# Patient Record
Sex: Female | Born: 1985 | Race: Black or African American | Hispanic: No | Marital: Single | Smoking: Never smoker
Health system: Southern US, Community
[De-identification: ages and names within clinical notes are randomized; demographics above are authoritative.]

## PROBLEM LIST (undated history)

## (undated) ENCOUNTER — Inpatient Hospital Stay (HOSPITAL_COMMUNITY): Payer: Self-pay

## (undated) DIAGNOSIS — E0590 Thyrotoxicosis, unspecified without thyrotoxic crisis or storm: Secondary | ICD-10-CM

## (undated) DIAGNOSIS — K819 Cholecystitis, unspecified: Secondary | ICD-10-CM

## (undated) DIAGNOSIS — D573 Sickle-cell trait: Secondary | ICD-10-CM

## (undated) DIAGNOSIS — I10 Essential (primary) hypertension: Secondary | ICD-10-CM

## (undated) DIAGNOSIS — M2550 Pain in unspecified joint: Secondary | ICD-10-CM

## (undated) DIAGNOSIS — R51 Headache: Secondary | ICD-10-CM

## (undated) DIAGNOSIS — Z87440 Personal history of urinary (tract) infections: Secondary | ICD-10-CM

## (undated) HISTORY — PX: DILATION AND CURETTAGE OF UTERUS: SHX78

---
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
## 2003-04-26 ENCOUNTER — Emergency Department (HOSPITAL_COMMUNITY): Admission: AD | Admit: 2003-04-26 | Discharge: 2003-04-26 | Payer: Self-pay | Admitting: Family Medicine

## 2004-09-01 ENCOUNTER — Emergency Department (HOSPITAL_COMMUNITY): Admission: EM | Admit: 2004-09-01 | Discharge: 2004-09-01 | Payer: Self-pay | Admitting: *Deleted

## 2004-12-08 ENCOUNTER — Inpatient Hospital Stay (HOSPITAL_COMMUNITY): Admission: AD | Admit: 2004-12-08 | Discharge: 2004-12-08 | Payer: Self-pay | Admitting: *Deleted

## 2004-12-22 ENCOUNTER — Inpatient Hospital Stay (HOSPITAL_COMMUNITY): Admission: AD | Admit: 2004-12-22 | Discharge: 2004-12-22 | Payer: Self-pay | Admitting: *Deleted

## 2004-12-31 ENCOUNTER — Encounter: Payer: Self-pay | Admitting: Obstetrics & Gynecology

## 2005-01-01 ENCOUNTER — Observation Stay (HOSPITAL_COMMUNITY): Admission: EM | Admit: 2005-01-01 | Discharge: 2005-01-02 | Payer: Self-pay | Admitting: Emergency Medicine

## 2005-02-03 ENCOUNTER — Inpatient Hospital Stay (HOSPITAL_COMMUNITY): Admission: AD | Admit: 2005-02-03 | Discharge: 2005-02-03 | Payer: Self-pay | Admitting: Obstetrics and Gynecology

## 2005-02-05 ENCOUNTER — Inpatient Hospital Stay (HOSPITAL_COMMUNITY): Admission: AD | Admit: 2005-02-05 | Discharge: 2005-02-05 | Payer: Self-pay | Admitting: Obstetrics & Gynecology

## 2005-03-09 ENCOUNTER — Ambulatory Visit (HOSPITAL_COMMUNITY): Admission: RE | Admit: 2005-03-09 | Discharge: 2005-03-09 | Payer: Self-pay | Admitting: *Deleted

## 2005-03-23 ENCOUNTER — Ambulatory Visit: Payer: Self-pay | Admitting: *Deleted

## 2005-05-24 ENCOUNTER — Ambulatory Visit: Payer: Self-pay | Admitting: Certified Nurse Midwife

## 2005-05-24 ENCOUNTER — Inpatient Hospital Stay (HOSPITAL_COMMUNITY): Admission: AD | Admit: 2005-05-24 | Discharge: 2005-05-24 | Payer: Self-pay | Admitting: Obstetrics & Gynecology

## 2005-07-17 ENCOUNTER — Inpatient Hospital Stay (HOSPITAL_COMMUNITY): Admission: AD | Admit: 2005-07-17 | Discharge: 2005-07-20 | Payer: Self-pay | Admitting: Gynecology

## 2005-07-17 ENCOUNTER — Ambulatory Visit: Payer: Self-pay | Admitting: Family Medicine

## 2006-01-15 ENCOUNTER — Emergency Department (HOSPITAL_COMMUNITY): Admission: EM | Admit: 2006-01-15 | Discharge: 2006-01-15 | Payer: Self-pay | Admitting: Emergency Medicine

## 2006-07-25 ENCOUNTER — Emergency Department (HOSPITAL_COMMUNITY): Admission: EM | Admit: 2006-07-25 | Discharge: 2006-07-25 | Payer: Self-pay | Admitting: Emergency Medicine

## 2007-02-27 ENCOUNTER — Emergency Department (HOSPITAL_COMMUNITY): Admission: EM | Admit: 2007-02-27 | Discharge: 2007-02-27 | Payer: Self-pay | Admitting: Family Medicine

## 2008-02-14 ENCOUNTER — Emergency Department (HOSPITAL_COMMUNITY): Admission: EM | Admit: 2008-02-14 | Discharge: 2008-02-14 | Payer: Self-pay | Admitting: Family Medicine

## 2009-01-27 ENCOUNTER — Emergency Department (HOSPITAL_COMMUNITY): Admission: EM | Admit: 2009-01-27 | Discharge: 2009-01-27 | Payer: Self-pay | Admitting: Family Medicine

## 2010-09-04 NOTE — Group Therapy Note (Signed)
NAME:  Grace [name], Grace [name]                      ACCOUNT NO.:  [id number]   MEDICAL RECORD NO.:  [id number]                   PATIENT TYPE:  OUT   LOCATION:  [facility]                           FACILITY:  [facility]   PHYSICIAN:  Dorthula Perfect, MD                  DATE OF BIRTH:  [birth year]   DATE OF SERVICE:                                    CLINIC NOTE   HISTORY:  This is an 25 year old G1, P0-0-1-0 who had presented on August 12  to maternity admissions after having been seen at [facility] Emergency Room  two weeks prior to that with vomiting and a positive pregnancy test.  An  ultrasound was obtained on August 12 showing a probable molar pregnancy.  At  that time she had a urinalysis done that was positive for E. coli, pan  sensitive except for ampicillin.  She had a Chlamydia that was negative and  a GC that was negative.  She was counseled by Dorthula Perfect, MD who  proceeded with dilatation and evacuation suction procedure.  In addition, it  was thought that she may have a urethral diverticulum present.  Of note, the  urethra was cultured for some purulent discharge which also grew out E. coli  sensitive to everything except ampicillin.  Intraoperatively there were no  complications and EBL was about 1400-1500 mL.  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  Her beta hCG at that time was 158,000.  She  was discharged home on the 12th with the plan to get weekly quantitative  beta hCGs and to come back today for her postoperative check.  She did not  have her follow-up hCGs done yet.  She did complete her Keflex course August  12 through August 19.  She is not sexually active and is interested in  contraception.  She is asymptomatic for anemia.  Denies fatigue or weakness.  Her diet is fair.  She is unemployed.  Plans to look for a job soon.  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  This is not bothering her at the present time.   PHYSICAL EXAMINATION:  VITAL SIGNS:  Blood pressure 113/66, pulse 100.  GENERAL:  This is a thin black female in no distress.  Seems reserved and  tense.  ABDOMEN:  Soft, slightly protuberant, nontender, nondistended.  Fundus  palpable above symphysis.  PELVIC:  External genitalia significant for about 2 cm induration at  urethral meatus.  There is no purulence or other discharge at present.  It  is nontender and noninflamed.  Vagina without lesions.  Cervix without  lesions.  Bimanual reveals long, closed cervix and uterus seems smooth and  consistent without definite masses, but enlarged about 8-10 weeks size.  Adnexa also nontender.   ASSESSMENT:  1. Two weeks status  post D&E for molar pregnancy.  2. Microcytic anemia with hemoglobin 10.8, MCV 90.  3. 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  4. Urinary tract colonization versus infection, adequately treated.  5. In need of contraception.   PLAN:  The patient is to get a weekly follow-up quantitative beta hCG until  it becomes negative.  She will get one today.  After it becomes negative she  is to get monthly quantitative beta hCGs for six months.  She is advised to  begin iron supplementation 300 mg p.o. b.i.d. and eat high iron foods.  She  received Depo-Provera 150 mg IM today with instructions to follow up every  three months at [facility] where she normally gets her GYN care and  Pap smears.  We will consider follow-up for her SGPT which was mildly  elevated at 43 on August 12 as an incidental finding.  Will consult Dorthula Perfect, MD on need for follow-up here.     Sharolyn Douglas, CNM                           Dorthula Perfect, MD    DP/MEDQ  D:  12/11/2002  T:  12/11/2002  Job:  [id number]

## 2010-09-04 NOTE — H&P (Signed)
NAMEAMARRAH, MEINHART            ACCOUNT NO.:  [id number]   MEDICAL RECORD NO.:  [id number]          PATIENT TYPE:  INP   LOCATION:  6529                         FACILITY:  [facility]   PHYSICIAN:  Elliot Cousin, M.D.    DATE OF BIRTH:  August 14, 1985   DATE OF ADMISSION:  12/31/2004  DATE OF DISCHARGE:                                HISTORY & PHYSICAL   PRIMARY CARE PHYSICIAN:  The patient is currently unassigned.   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  The patient actually presented to [facility] initially but was  transferred to [facility] Emergency Department given her history.  Over the  past two weeks, the patient has had intermittent nausea and vomiting.  Her  intake has been fair to poor.  Over the past two days, she has been unable  to keep any food down.  She has been able to drink fluids at a minimal  level.  She usually drinks 4-5 cups of water or juice daily.  Associated  with the poor intake, the patient complains of a rapid heartbeat,  particularly when she stands up and walks.  At rest, she does not feel her  heart racing.  When she stands up and moves around, however, she feels her  heart beat much faster.  The rapid heartbeat is sometimes associated with  shortness of breath.  Occasionally, she will have light-headedness when she  stands up.  Her symptoms are not associated with chest pain.  When she sits  down, she feels better.  Her symptoms are similar to her presentation in  2004 when she suffered a spontaneous pneumomediastinum secondary to  increased intrathoracic pressure  from vomiting during her pregnancy at that  time.  However, her symptoms are different tonight in that she does not have  chest pain and she is not as short of breath.  The patient denies any  diarrhea, vaginal discharge, vaginal bleeding, or hematemesis.  She denies  hemoptysis, pleurisy, or cough.  She denies fever and chills.  She is [redacted]  weeks pregnant, diagnosed with a positive pregnancy test a few weeks ago.  She had an obstetrical ultrasound on December 08, 2004 which revealed a single  living intrauterine gestation with a crown-rump length indicating a  gestational age of [redacted] weeks 5 days.   When the patient presented to the emergency department at [facility], she was afebrile, with a temperature of 98.4; blood pressure  114/73; pulse rate 110; respiratory rate 20; and oxygen saturation of 100%  on room air.  When she stood up, her pulse rate increased to 156.  Her lab  data were essentially within normal limits.  The patient will be admitted  for further evaluation and management.   PAST MEDICAL HISTORY:  1.  Gravida 2 para 0.  2.  Spontaneous pneumomediastinum secondary to increased intrathoracic      pressure from increased vomiting during pregnancy in August 2004.  3.  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 Perlie Gold, November 29, 2002.   MEDICATIONS:  1.  Vitamin B6, 1 tablet daily.  2.  Unisom 25 mg daily.   ALLERGIES:  No known drug allergies.   SOCIAL HISTORY:  The patient is single.  She is unemployed.  She lives with  her mother.  She is a Engineer, agricultural.  She can read and write.  She  denies tobacco, drug, and alcohol use.   FAMILY HISTORY:  Her mother is 33 years of age and has diabetes and  hypertension.  Her father is 8 years of age; health is unknown.   REVIEW OF SYSTEMS:  The patient's review of systems is otherwise negative.   EXAMINATION:  VITAL SIGNS:  Temperature 98.4; supine blood pressure 114/73,   standing blood pressure 131/82; supine pulse 110, standing pulse 156;  respiratory rate 20; oxygen saturation 100% on room air.  GENERAL:  The patient is a medium-size, 25 year old, African-American woman  who is currently sitting up in bed in no acute distress.  HEENT:  Head is normocephalic, nontraumatic.  Pupils are equal, round, and  reactive to light.  Extraocular movements are intact.  Conjunctivae are  clear.  Sclerae are white.  Tympanic membranes are clear bilaterally.  Nasal  mucosa is mildly dry.  No sinus tenderness.  Oropharynx reveals mildly dry  mucous membranes.  No posterior exudates or erythema.  Teeth are in fairly  good repair.  NECK:  Supple.  No adenopathy, no thyromegaly, no bruit, no JVD.  LUNGS:  Clear to auscultation bilaterally.  HEART:  S1, S2, with no murmurs, rubs, or gallops.  However, there is  tachycardia.  ABDOMEN:  Mildly obese.  Positive bowel sounds.  Soft, nontender,  nondistended.  No hepatosplenomegaly.  No masses palpated.  RECTAL/GU:  Deferred.  EXTREMITIES:  Pedal pulses are 2+ bilaterally.  No pedal edema.  No  pretibial edema.  The patient has good range of motion of her extremities.  No acute joint abnormalities.  NEUROLOGIC:  The patient is alert and oriented x3.  Cranial nerves II-XII  are intact.  Strength is 5/5 throughout.  Sensation is intact.   ADMISSION LABORATORIES:  EKG:  Sinus tachycardia, with a heart rate of 109  beats per minute.  Chest x-ray:  No active cardiopulmonary disease.  No  pneumothorax or pneumomediastinum.  WBC 8.5, hemoglobin 13.6, hematocrit  39.9, MCV 86, platelets 415.  CK-MB less than 1, myoglobin 49.9, troponin I  less than 0.05.  Sodium 136, potassium 3.2, chloride 101, CO2 22, glucose  85, BUN 4, creatinine 0.6, calcium 9.2.  Urinalysis positive for small  bilirubin, greater than 80 ketones, leukocyte esterase large, WBC 7-10, RBCs  0-2, bacteria many, urine epithelial cells many.  ASSESSMENT:  1.  Sinus  tachycardia, with accelerated orthostatic tachycardia.  The most      likely etiology of the patient's tachycardia is volume depletion from      hyperemesis.  When she stands, she is more symptomatic with light-      headedness and tachycardia.  Her heart rate went from 110 in the supine      position to 156 while standing.  The patient has been obviously      symptomatic enough at home  that she presented to the emergency      department.  She does have a history of pneumomediastinum.  However, her      chest x-ray does not show pneumomediastinum, nor does it show a      pneumothorax.  She is oxygenating well within normal limits on room air.      She denies pleurisy and chest pain.  2.  Intractable nausea and vomiting/hyperemesis.  The patient's nausea and      vomiting are most likely secondary to pregnancy.  3.  A 9-week gestational pregnancy.  The patient is currently gravida 2 para      0.  Her past pregnancy history is noted above.  4.  Pyuria.  The patient has pyuria and bacteriuria on urinalysis.  However,      she has no complaints of dysuria.   PLAN:  1.  The patient will be admitted for further evaluation and management.  2.  The patient was given approximately 2 liters of either normal saline or      lactated Ringer's in the emergency department.  IV fluid volume      repletion will continue with D5-normal saline with potassium chloride      added at 150 cc/hour.  3.  Will add an amp of multivitamin with iron to 1 liter of IV fluids daily.  4.  Will replete potassium chloride in the IV fluids and via potassium      chloride 10 mEq IV runs.  Will also check a magnesium level.  5.  Will treat nausea and vomiting with Phenergan as needed.  6.  Will start clear liquids, except that the patient may have crackers as      needed for relief of nausea and vomiting.  7.  Will check a TSH, free T4, and urine drug screen.  8.  Will follow up lab data with a CMET at 2:00 p.m. today for  further      assessment.      Elliot Cousin, M.D.  Electronically Signed     DF/MEDQ  D:  01/01/2005  T:  01/01/2005  Job:  [id number]

## 2010-09-04 NOTE — Op Note (Signed)
   NAME:  Grace [name], BENHAM                      ACCOUNT NO.:  [id number]   MEDICAL RECORD NO.:  [id number]                   PATIENT TYPE:  AMB   LOCATION:  [facility]C                                  [facility]ACILITY:  [facility]   PHYSICIAN:  Clement Husbands, M.D.         DATE OF BIRTH:  Aug 03, 1985   DATE OF PROCEDURE:  11/29/2002  DATE OF DISCHARGE:                                 OPERATIVE REPORT   PREOPERATIVE DIAGNOSIS:  1. Molar pregnancy.  2. 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   POSTOPERATIVE DIAGNOSIS:  1. Molar pregnancy.  2. 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   OPERATION:  Suction dilation and curettage.   SURGEON:  Burnadette Peter, M.D.   ANESTHESIA:  General.   PROCEDURE:  Pelvic examination was performed under anesthesia which revealed  the uterus to be 16 to 18 weeks size.  During the exam, pus like material  came out of the urethra and massage of the suburethral area produced some  more.  It was sent for culture.  Anterior lip of the cervix was grasped with  a single tooth tenaculum.  The cervical os had already dilated itself and  easily accepted a #7 Hegar.  She was easily dilated to a #10.  Using a #10  curved suction curet, aspiration of the uterine contents was done  repeatedly.  The tissue, black fluid, and blood were all  removed.  This was  done repeatedly.  There was a moderate amount of blood loss.  Sharp  curettage was done at times.  The uterus, with intravenous Pitocin running,  came down to about a 10 weeks size.  It was still boggy and she was  bleeding, so IM Methergine was given followed by Hemabate.  Uterine massage  was continued.  Suction was done one more time and no additional tissue or  contents were noted.  Sharp curettage and no additional tissue was noted.  Pelvic examination at the time of the procedure revealed the uterus to be 6-  8 weeks size and quite firm.  Estimated blood loss (including tissue) was  1400 to 1500 mL.  She tolerated the procedure  well and was returned to the  recovery room in satisfactory condition.                                               Clement Husbands, M.D.    EFR/MEDQ  D:  11/29/2002  T:  11/29/2002  Job:  [id number]

## 2010-09-04 NOTE — Discharge Summary (Signed)
Grace [name], Grace [name]            ACCOUNT NO.:  [id number]   MEDICAL RECORD NO.:  [id number]          PATIENT TYPE:  OBV   LOCATION:  6529                         FACILITY:  [facility]   PHYSICIAN:  Lonia Blood, M.D.       DATE OF BIRTH:  03-Sep-1985   DATE OF ADMISSION:  12/31/2004  DATE OF DISCHARGE:  01/02/2005                                 DISCHARGE SUMMARY   DISCHARGE DIAGNOSES:  1.  Hyperemesis gravidarum.  2.  Dehydration.  3.  Hypokalemia.  4.  Protein malnutrition.  5.  Asymptomatic bacteriuria.  6.  Tachycardia.  7.  Elevated total thyroxine levels.  8.  Obesity.  9.  Gravida 9 weeks.  10. History of spontaneous pneumomediastinum secondary to increased      intrathoracic pressure from vomiting during the pregnancy in August      2004.  11. History of molar pregnancy in August 2004.  12. Constipation.   DISCHARGE MEDICATIONS:  1.  Prenatal vitamins daily.  2.  Amoxicillin 500 mg p.o. b.i.d. for two days.   CONDITION ON DISCHARGE:  The patient was discharged in good condition.  At  the time of discharge she was able to eat and drink without vomiting.  Her  vital signs were within normal limits and her heart rate was at 100 beats  per minute.   FOLLOW UP:  She was instructed to follow up with [facility]  clinic as previously scheduled and follow-up will be arranged with an  endocrinology practice in [place].  The patient will be called at home  and a date for the appointment will be communicated on January 04, 2005,  as now is the weekend and there are no endocrinology doctors available.   PROCEDURES DURING THIS ADMISSION:  No procedures were done.   CONSULTATIONS:  A telephonic consultation with the [facility]  endocrinology specialist was obtained on January 01, 2005.   HISTORY OF PRESENT ILLNESS:  For the admission H&P please refer to the  dictated H&P done by Dr. Elliot Cousin.  Briefly Grace [name] is a 25-year-  old  African-American woman, 9 weeks' pregnant, who presented to the  emergency room for persistent nausea, vomiting, and palpitations.  Upon  presentation to the emergency room she had a heart rate of 156 per minute  and she had orthostatic changes in her blood pressure.  She was admitted to  the hospital for further evaluation.   HOSPITAL COURSE:  1.  TACHYCARDIA.  An EKG was obtained and showed sinus tachycardia.  Serial      cardiac enzymes ruled out a myocardial infarction.  Chest x-ray failed      to show any cardiomegaly.  A BNP level was within normal limits.  Given      all the above, dilated cardiomyopathy was ruled out from the diagnostic      list.  To further evaluate the tachycardia, diagnostic studies for the      thyroid were pursued.  A total T4 was 24.4 and a TSH was 0.014.  It was      felt that the patient  had some mild hyperthyroidism, most likely related      to hyperemesis.  A telephonic consultation with [facility]      endocrinologist on call was obtained.  A free T4 level was pending at      the time of discharge.  The plan is to continue vigorous oral hydration      at home.  The patient will have a follow-up appointment in an      endocrinology practice to recheck free T4 in about a week.  At the time      of discharge her tachycardia improved significantly.  Her heart rate at      discharge was about 100 beats per minute.  Throughout the hospital stay      Grace [name] was kept on intravenous fluids.  Her potassium was repleted      and her blood pressure was stable at the time of discharge.   1.  During a routine check upon admission the patient had a urinalysis that      showed positive white blood cells, positive leukocyte esterase, positive      bacteria.  There are absolutely no symptoms, no dysuria, no hematuria,      no flank pain, no fever.  White blood cell count is within normal      limits.  It was felt that this was asymptomatic bacteriuria and       treatment with amoxicillin 500 mg by mouth twice a day was given for a      total of three days.  She will have a follow-up urinalysis at the visit      with the obstetrician/gynecologist.   1.  9-WEEK GESTATION.  An ultrasound was obtained at the time of admission      and showed the normal gestational sac within the uterus and the follow-      up visit is already scheduled at [facility] outpatient clinic.   1.  CONSTIPATION.  While in the hospital the patient was constipated.  She      was given a __________enema x1.           ______________________________  Lonia Blood, M.D.     SL/MEDQ  D:  01/02/2005  T:  01/02/2005  Job:  [id number]   cc:   Lesly Dukes, M.D.

## 2010-09-04 NOTE — Consult Note (Signed)
NAME:  Grace [name], MARCZAK                      ACCOUNT NO.:  [id number]   MEDICAL RECORD NO.:  [id number]                   PATIENT TYPE:  EMS   LOCATION:  MINO                                 FACILITY:  [facility]   PHYSICIAN:  Salvatore Decent. Dorris Fetch, M.D.         DATE OF BIRTH:  1985/10/13   DATE OF CONSULTATION:  11/18/2002  DATE OF DISCHARGE:                                   CONSULTATION   REASON FOR CONSULTATION:  Spontaneous pneumomediastinum.   CHIEF COMPLAINT:  Chest pain.   HISTORY OF PRESENT ILLNESS:  The patient is an 25 year old African-American  female who is nine weeks pregnant.  She has had hyperemesis of early  pregnancy.  Yesterday she had marked increase in the frequency and severity  of her emesis. She came to the emergency room complaining of pain in her  chest and her heart racing. An EKG showed sinus tachycardia and the patient  was dehydrated.  She was brought in and resuscitated.  Chest x-ray showed a  spontaneous pneumomediastinum.  She was given Zofran and Phenergan for her  nausea.  She continued to have vomiting overnight.  Today she returned for a  recheck and there was a slight increase in the size of her  pneumomediastinum.  Because of her continued pain, emesis and increasing  size of the pneumomediastinum, a barium swallow was performed to rule out an  esophageal perforation that was negative.   PAST MEDICAL HISTORY:  She is nine weeks pregnant.  She has no other major  medical problems.   MEDICATIONS:  1. Zofran.  2. Phenergan.   ALLERGIES:  She has no drug allergies.   FAMILY HISTORY:  Noncontributory.   SOCIAL HISTORY:  She has smoked marijuana one time recently but not in the  last two days.   PHYSICAL EXAMINATION:  GENERAL:  This is an 25 year old African-American  female in no acute distress.  VITAL SIGNS:  Blood pressure is 117/65, pulse is 104 in sinus tachycardia,  respirations are 20, oxygen saturation is 100% on room air.  HEENT:   Her trachea is midline.  She has no subcutaneous emphysema in the  neck or chest.  LUNGS:  Clear with equal breath sounds bilaterally.  CARDIAC:  Normal S1 and S2 with a questionable crunch.  Heart sounds are  loud.   Chest x-ray was reviewed and it does show pneumomediastinum with some  plaquing of the air superiorly into the thoracic outlet.   IMPRESSION:  The patient is an 25 year old African-American female with  early pregnancy with hyperemesis. She has a spontaneous pneumomediastinum  and esophageal rupture was ruled out with a negative swallow.  Therefore the  likely mechanism is rupture of the intraparenchymal bleb with retrograde  dissection of the air into the mediastinum.  Primary treatment in this case  is to deal with the underlying cause of her increased intrathoracic pressure  which in her case is vomiting.  She currently is able to  tolerate clear  liquids and has prescriptions for Zofran and Phenergan.  She should avoid  heavy exertion until her pain has completely resolved.  If she were to  develop fevers, chills, increased pain or change in the character of her  pain or shortness of breath, she should return to the emergency room  immediately. These issues were discussed with the patient and her mother.  Otherwise, no specific follow-up is indicated.  All of the patient's and  mother's questions were answered.                                               Salvatore Decent Dorris Fetch, M.D.    SCH/MEDQ  D:  11/18/2002  T:  11/19/2002  Job:  [id number]   cc:   Lorre Nick, M.D.  [address] [place], [place] [address]  Fax: [phone]

## 2010-10-08 ENCOUNTER — Inpatient Hospital Stay (HOSPITAL_COMMUNITY)
Admission: AD | Admit: 2010-10-08 | Discharge: 2010-10-08 | Disposition: A | Discharge status: Home / Self Care | Payer: Medicaid Other | Source: Ambulatory Visit | Attending: Obstetrics & Gynecology | Admitting: Obstetrics & Gynecology

## 2010-10-08 ENCOUNTER — Inpatient Hospital Stay (HOSPITAL_COMMUNITY): Payer: Medicaid Other

## 2010-10-08 DIAGNOSIS — A499 Bacterial infection, unspecified: Secondary | ICD-10-CM | POA: Insufficient documentation

## 2010-10-08 DIAGNOSIS — O2390 Unspecified genitourinary tract infection in pregnancy, unspecified trimester: Secondary | ICD-10-CM | POA: Insufficient documentation

## 2010-10-08 DIAGNOSIS — N760 Acute vaginitis: Secondary | ICD-10-CM | POA: Insufficient documentation

## 2010-10-08 DIAGNOSIS — O210 Mild hyperemesis gravidarum: Secondary | ICD-10-CM

## 2010-10-08 DIAGNOSIS — B9689 Other specified bacterial agents as the cause of diseases classified elsewhere: Secondary | ICD-10-CM | POA: Insufficient documentation

## 2010-10-08 LAB — URINALYSIS, ROUTINE W REFLEX MICROSCOPIC
Glucose, UA: NEGATIVE mg/dL
Ketones, ur: 40 mg/dL — AB
Nitrite: NEGATIVE
Specific Gravity, Urine: 1.025 (ref 1.005–1.030)
Urobilinogen, UA: 1 mg/dL (ref 0.0–1.0)
pH: 6 (ref 5.0–8.0)

## 2010-10-08 LAB — ABO/RH: ABO/RH(D): A POS

## 2010-10-08 LAB — WET PREP, GENITAL: Yeast Wet Prep HPF POC: NONE SEEN

## 2010-10-08 LAB — URINE MICROSCOPIC-ADD ON

## 2010-10-08 LAB — HCG, QUANTITATIVE, PREGNANCY: hCG, Beta Chain, Quant, S: 72363 m[IU]/mL — ABNORMAL HIGH (ref <5)

## 2010-10-08 LAB — CBC
Hemoglobin: 12.9 g/dL (ref 12.0–15.0)
MCH: 31.4 pg (ref 26.0–34.0)
MCHC: 37 g/dL — ABNORMAL HIGH (ref 30.0–36.0)
MCV: 84.9 fL (ref 78.0–100.0)
Platelets: 358 10*3/uL (ref 150–400)
RBC: 4.11 MIL/uL (ref 3.87–5.11)
WBC: 10 10*3/uL (ref 4.0–10.5)

## 2010-10-08 LAB — POCT PREGNANCY, URINE: Preg Test, Ur: POSITIVE

## 2010-10-09 LAB — GC/CHLAMYDIA PROBE AMP, GENITAL
Chlamydia, DNA Probe: NEGATIVE
GC Probe Amp, Genital: NEGATIVE

## 2010-10-10 LAB — URINE CULTURE: Culture  Setup Time: 201206220209

## 2010-10-17 ENCOUNTER — Inpatient Hospital Stay (HOSPITAL_COMMUNITY): Payer: Medicaid Other

## 2010-10-17 ENCOUNTER — Inpatient Hospital Stay (HOSPITAL_COMMUNITY)
Admission: AD | Admit: 2010-10-17 | Discharge: 2010-10-17 | Disposition: A | Discharge status: Home / Self Care | Payer: Medicaid Other | Source: Ambulatory Visit | Attending: Obstetrics and Gynecology | Admitting: Obstetrics and Gynecology

## 2010-10-17 DIAGNOSIS — N390 Urinary tract infection, site not specified: Secondary | ICD-10-CM | POA: Insufficient documentation

## 2010-10-17 DIAGNOSIS — K219 Gastro-esophageal reflux disease without esophagitis: Secondary | ICD-10-CM | POA: Insufficient documentation

## 2010-10-17 DIAGNOSIS — O2390 Unspecified genitourinary tract infection in pregnancy, unspecified trimester: Secondary | ICD-10-CM | POA: Insufficient documentation

## 2010-10-17 LAB — URINALYSIS, ROUTINE W REFLEX MICROSCOPIC
Bilirubin Urine: NEGATIVE
Glucose, UA: NEGATIVE mg/dL
Hgb urine dipstick: NEGATIVE
Nitrite: POSITIVE — AB
Protein, ur: NEGATIVE mg/dL
Specific Gravity, Urine: 1.015 (ref 1.005–1.030)
pH: 6 (ref 5.0–8.0)

## 2010-10-17 LAB — CBC
HCT: 37 % (ref 36.0–46.0)
Hemoglobin: 13.3 g/dL (ref 12.0–15.0)
MCH: 31.4 pg (ref 26.0–34.0)
MCHC: 35.9 g/dL (ref 30.0–36.0)
MCV: 87.3 fL (ref 78.0–100.0)
Platelets: 345 10*3/uL (ref 150–400)
RDW: 12.2 % (ref 11.5–15.5)
WBC: 7.9 10*3/uL (ref 4.0–10.5)

## 2010-10-17 LAB — URINE MICROSCOPIC-ADD ON

## 2010-10-17 LAB — TSH: TSH: 0.042 u[IU]/mL — ABNORMAL LOW (ref 0.350–4.500)

## 2010-10-19 LAB — URINE CULTURE
Colony Count: >=100000
Culture  Setup Time: 201206301755

## 2010-11-18 ENCOUNTER — Encounter (HOSPITAL_COMMUNITY): Payer: Self-pay | Admitting: *Deleted

## 2010-11-18 ENCOUNTER — Inpatient Hospital Stay (HOSPITAL_COMMUNITY)
Admission: AD | Admit: 2010-11-18 | Discharge: 2010-11-19 | Disposition: A | Discharge status: Home / Self Care | Payer: Self-pay | Source: Ambulatory Visit | Attending: Obstetrics and Gynecology | Admitting: Obstetrics and Gynecology

## 2010-11-18 DIAGNOSIS — R1013 Epigastric pain: Secondary | ICD-10-CM | POA: Insufficient documentation

## 2010-11-18 DIAGNOSIS — R112 Nausea with vomiting, unspecified: Secondary | ICD-10-CM | POA: Insufficient documentation

## 2010-11-18 DIAGNOSIS — E860 Dehydration: Secondary | ICD-10-CM | POA: Insufficient documentation

## 2010-11-18 DIAGNOSIS — E876 Hypokalemia: Secondary | ICD-10-CM | POA: Insufficient documentation

## 2010-11-18 LAB — URINE MICROSCOPIC-ADD ON

## 2010-11-18 LAB — DIFFERENTIAL
Basophils Absolute: 0 10*3/uL (ref 0.0–0.1)
Basophils Relative: 0 % (ref 0–1)
Eosinophils Absolute: 0.1 10*3/uL (ref 0.0–0.7)
Monocytes Absolute: 1 10*3/uL (ref 0.1–1.0)
Neutro Abs: 4 10*3/uL (ref 1.7–7.7)
Neutrophils Relative %: 54 % (ref 43–77)

## 2010-11-18 LAB — COMPREHENSIVE METABOLIC PANEL
AST: 21 U/L (ref 0–37)
Albumin: 3.4 g/dL — ABNORMAL LOW (ref 3.5–5.2)
Chloride: 97 mEq/L (ref 96–112)
Creatinine, Ser: <0.47 mg/dL — ABNORMAL LOW (ref 0.50–1.10)
Total Bilirubin: 0.5 mg/dL (ref 0.3–1.2)
Total Protein: 7.6 g/dL (ref 6.0–8.3)

## 2010-11-18 LAB — URINALYSIS, ROUTINE W REFLEX MICROSCOPIC
Glucose, UA: NEGATIVE mg/dL
Hgb urine dipstick: NEGATIVE
Specific Gravity, Urine: 1.02 (ref 1.005–1.030)
pH: 6 (ref 5.0–8.0)

## 2010-11-18 LAB — CBC
HCT: 34.8 % — ABNORMAL LOW (ref 36.0–46.0)
MCHC: 35.9 g/dL (ref 30.0–36.0)
RDW: 12.5 % (ref 11.5–15.5)

## 2010-11-18 MED ORDER — POTASSIUM CHLORIDE IN NACL 20-0.9 MEQ/L-% IV SOLN: Freq: Once | INTRAVENOUS | Status: DC

## 2010-11-18 MED ORDER — ONDANSETRON 8 MG PO TBDP
8.0000 mg | ORAL_TABLET | Freq: Once | ORAL | Status: AC
  Administered 2010-11-19: 8 mg via ORAL
  Filled 2010-11-18: qty 1

## 2010-11-18 NOTE — ED Provider Notes (Signed)
History   Pt presents today c/o N&V and abd pain that radiates to her epigastric area. She denies vag dc, bleeding, or any other sx at this time. She states it has been difficult for her to keep anything on her stomach.  Chief Complaint  Patient presents with  . Abdominal Pain   HPI  OB History    Grav Para Term Preterm Abortions TAB SAB Ect Mult Living   3 1 1  1  1   1       Past Medical History  Diagnosis Date  . Hypothyroidism     Past Surgical History  Procedure Date  . No past surgeries     No family history on file.  History  Substance Use Topics  . Smoking status: Never Smoker   . Smokeless tobacco: Not on file  . Alcohol Use: No    Allergies: No Known Allergies  Prescriptions prior to admission  Medication Sig Dispense Refill  . prenatal vitamin w/FE, FA (PRENATAL 1 + 1) 27-1 MG TABS Take 1 tablet by mouth daily.          Review of Systems  Constitutional: Negative for fever and chills.  Respiratory: Negative for cough.   Cardiovascular: Negative for chest pain and palpitations.  Gastrointestinal: Positive for nausea, vomiting and abdominal pain. Negative for diarrhea and constipation.  Genitourinary: Negative for dysuria, urgency, frequency, hematuria and flank pain.  Neurological: Negative for dizziness and headaches.  Psychiatric/Behavioral: Negative for depression and suicidal ideas.   Physical Exam   Blood pressure 120/77, pulse 89, temperature 99 F (37.2 C), temperature source Oral, resp. rate 18, height 5\' 4"  (1.626 m), weight 134 lb 12.8 oz (61.145 kg).  Physical Exam  Constitutional: She is oriented to person, place, and time. She appears well-developed and well-nourished. No distress.  HENT:  Head: Normocephalic and atraumatic.  Eyes: EOM are normal. Pupils are equal, round, and reactive to light.  GI: Soft. She exhibits no distension. There is no tenderness. There is no rebound and no guarding.  Neurological: She is alert and oriented  to person, place, and time.  Skin: Skin is warm and dry. She is not diaphoretic.  Psychiatric: She has a normal mood and affect. Her behavior is normal. Judgment and thought content normal.    MAU Course  Procedures  Results for orders placed during the hospital encounter of 11/18/10 (from the past 24 hour(s))  URINALYSIS, ROUTINE W REFLEX MICROSCOPIC     Status: Abnormal   Collection Time   11/18/10  9:15 PM      Component Value Range   Color, Urine YELLOW  YELLOW    Appearance HAZY (*) CLEAR    Specific Gravity, Urine 1.020  1.005 - 1.030    pH 6.0  5.0 - 8.0    Glucose, UA NEGATIVE  NEGATIVE (mg/dL)   Hgb urine dipstick NEGATIVE  NEGATIVE    Bilirubin Urine NEGATIVE  NEGATIVE    Ketones, ur 15 (*) NEGATIVE (mg/dL)   Protein, ur NEGATIVE  NEGATIVE (mg/dL)   Urobilinogen, UA 4.0 (*) 0.0 - 1.0 (mg/dL)   Nitrite NEGATIVE  NEGATIVE    Leukocytes, UA SMALL (*) NEGATIVE   URINE MICROSCOPIC-ADD ON     Status: Abnormal   Collection Time   11/18/10  9:15 PM      Component Value Range   Squamous Epithelial / LPF FEW (*) RARE    WBC, UA 11-20  <3 (WBC/hpf)   Bacteria, UA MANY (*) RARE  Urine-Other MUCOUS PRESENT    CBC     Status: Abnormal   Collection Time   11/18/10 10:45 PM      Component Value Range   WBC 7.3  4.0 - 10.5 (K/uL)   RBC 4.04  3.87 - 5.11 (MIL/uL)   Hemoglobin 12.5  12.0 - 15.0 (g/dL)   HCT 16.1 (*) 09.6 - 46.0 (%)   MCV 86.1  78.0 - 100.0 (fL)   MCH 30.9  26.0 - 34.0 (pg)   MCHC 35.9  30.0 - 36.0 (g/dL)   RDW 04.5  40.9 - 81.1 (%)   Platelets 356  150 - 400 (K/uL)  DIFFERENTIAL     Status: Abnormal   Collection Time   11/18/10 10:45 PM      Component Value Range   Neutrophils Relative 54  43 - 77 (%)   Neutro Abs 4.0  1.7 - 7.7 (K/uL)   Lymphocytes Relative 31  12 - 46 (%)   Lymphs Abs 2.3  0.7 - 4.0 (K/uL)   Monocytes Relative 13 (*) 3 - 12 (%)   Monocytes Absolute 1.0  0.1 - 1.0 (K/uL)   Eosinophils Relative 1  0 - 5 (%)   Eosinophils Absolute 0.1  0.0 -  0.7 (K/uL)   Basophils Relative 0  0 - 1 (%)   Basophils Absolute 0.0  0.0 - 0.1 (K/uL)  COMPREHENSIVE METABOLIC PANEL     Status: Abnormal   Collection Time   11/18/10 10:45 PM      Component Value Range   Sodium 133 (*) 135 - 145 (mEq/L)   Potassium 2.9 (*) 3.5 - 5.1 (mEq/L)   Chloride 97  96 - 112 (mEq/L)   CO2 24  19 - 32 (mEq/L)   Glucose, Bld 84  70 - 99 (mg/dL)   BUN 3 (*) 6 - 23 (mg/dL)   Creatinine, Ser <9.14 (*) 0.50 - 1.10 (mg/dL)   Calcium 78.2  8.4 - 10.5 (mg/dL)   Total Protein 7.6  6.0 - 8.3 (g/dL)   Albumin 3.4 (*) 3.5 - 5.2 (g/dL)   AST 21  0 - 37 (U/L)   ALT 15  0 - 35 (U/L)   Alkaline Phosphatase 38 (*) 39 - 117 (U/L)   Total Bilirubin 0.5  0.3 - 1.2 (mg/dL)   GFR calc non Af Amer NOT CALCULATED  >60 (mL/min)   GFR calc Af Amer NOT CALCULATED  >60 (mL/min)     Assessment and Plan  N&V: pt doing much better after IV hydration and potassium supplementation. Will dc to home with Rx for phenergan. Discussed diet, activity, risks, and precautions. She will f/u with her OB provider. Discussed diet, activity, risks, and precautions.  Clinton Gallant. Damieon Armendariz III, DrHSc, MPAS, PA-C  11/18/2010, 11:34 PM   Henrietta Hoover, PA 11/19/10 [phone]

## 2010-11-18 NOTE — Progress Notes (Cosign Needed)
Pt G3 P1 at 13wks, having lower abd pain that radiates to upper abd, since 7/31.  Denies bleeding.  Vag Discharge thick and white.

## 2010-11-19 MED ORDER — PROMETHAZINE HCL 25 MG PO TABS: 25.0000 mg | ORAL_TABLET | Freq: Four times a day (QID) | ORAL | Status: DC | PRN

## 2010-11-19 MED ORDER — POTASSIUM CHLORIDE IN NACL 20-0.9 MEQ/L-% IV SOLN
INTRAVENOUS | Status: DC
  Filled 2010-11-19 (×20): qty 1000

## 2010-11-19 MED ORDER — SODIUM CHLORIDE 0.9 % IV SOLN
Freq: Once | INTRAVENOUS | Status: AC
  Administered 2010-11-19: via INTRAVENOUS
  Filled 2010-11-19: qty 1000

## 2010-12-01 ENCOUNTER — Other Ambulatory Visit: Payer: Self-pay | Admitting: Family Medicine

## 2010-12-01 DIAGNOSIS — D573 Sickle-cell trait: Secondary | ICD-10-CM

## 2010-12-01 LAB — CULTURE, OB URINE
Chlamydia Probe Amp: NEGATIVE
GC Probe Amp, Genital: NEGATIVE
Glucose, 1 hour: 136

## 2010-12-01 LAB — ABO/RH: RH Type: POSITIVE

## 2010-12-01 LAB — HEPATITIS B SURFACE ANTIGEN: Hepatitis B Surface Ag: NEGATIVE

## 2010-12-01 LAB — HIV ANTIBODY (ROUTINE TESTING W REFLEX): HIV: NONREACTIVE

## 2010-12-30 ENCOUNTER — Ambulatory Visit (HOSPITAL_COMMUNITY)
Admission: RE | Admit: 2010-12-30 | Discharge: 2010-12-30 | Disposition: A | Discharge status: Home / Self Care | Payer: Medicaid Other | Source: Ambulatory Visit | Attending: Family Medicine | Admitting: Family Medicine

## 2010-12-30 ENCOUNTER — Encounter (HOSPITAL_COMMUNITY): Payer: Self-pay

## 2010-12-30 DIAGNOSIS — Z1389 Encounter for screening for other disorder: Secondary | ICD-10-CM | POA: Insufficient documentation

## 2010-12-30 DIAGNOSIS — Z363 Encounter for antenatal screening for malformations: Secondary | ICD-10-CM | POA: Insufficient documentation

## 2010-12-30 DIAGNOSIS — D573 Sickle-cell trait: Secondary | ICD-10-CM

## 2010-12-30 DIAGNOSIS — O358XX0 Maternal care for other (suspected) fetal abnormality and damage, not applicable or unspecified: Secondary | ICD-10-CM | POA: Insufficient documentation

## 2010-12-30 NOTE — Progress Notes (Signed)
Patient seen for ultrasound only appointment today.  Please see [facility] report for details.  

## 2010-12-30 NOTE — Progress Notes (Signed)
Genetic Counseling  High-Risk Gestation Note  Appointment Date:  12/30/2010 Referred By: Reva Bores, MD Date of Birth:  [birth year] Partner: Murray Hodgkins    Pregnancy History: [id number] Estimated Date of Delivery: 05/28/11 Estimated Gestational Age: [redacted]w[redacted]d  I met with [name] and her partner, Mr. Murray Hodgkins, for genetic counseling regarding a family history of sickle cell anemia (SCA).  Both family histories were reviewed and were found to be contributory for Ms. Spruiell having a child, with a different partner, who has SCA.  Although medical records provided from the referring provider listed that both Ms. Stepney and Mr. Mayford Knife have sickle cell trait, Mr. Mayford Knife reported today that he DOES NOT have sickle cell trait.  He did not have an actual hemoglobin electrophoresis result to confirm this information.    This couple was counseled that SCA is a genetic condition that is more common in individuals who have African-American ancestry.  It was explained that the features of SCA are due to the production of abnormal hemoglobin and that hemoglobin is a protein that carries oxygen from the lungs to the body's organs and tissues.  They were then counseled that hemoglobin is made from instructions contained within our genes and changes within the beta globin gene can affects the shape and function of the red blood cells.  We discussed that the usual type of hemoglobin is called hemoglobin AA. Individuals usually have two copies of each gene; one copy inherited from the father, and the other copy inherited from the mother.  Likewise, only one copy of each gene of a pair is passed on to a child (pregnancy).  Carriers of SCA have hemoglobin AS, meaning that one beta globin gene codes for the typical hemoglobin A, and the other one is altered, coding for a hemoglobin S.  Because SCA carriers have one working copy of the beta globin gene, they are healthy and do not have any features of the  condition or related medical concerns.      We reviewed recessive inheritance in detail, including the 25% chance for recurrence with each pregnancy when both parents have hemoglobin AS.  Assuming the 1 in 10 carrier frequency of sickle cell trait in African-Americans, the risk for the fetus to have SCA is ~1 in 40 [1 (chance that Ms. Lybbert has hemoglobin AS) x 1/10 (chance that Mr. Mayford Knife has hemoglobin AS-without having hemoglobin electrophoresis) x  (the chance that both would pass on the altered beta globin gene)].   Briefly, they were then counseled that individuals with SCA have two altered copies of the beta globin gene, producing hemoglobin SS.  This altered form of hemoglobin tends to release oxygen too easily, and when the oxygen is gone the red blood cells become stiff and "sickled" or crescent shaped.  These red blood cells break down easier and faster than typical red blood cells which causes anemia.  The shortage of red blood cells prevents the appropriate transport of oxygen to the body and is characterized by tiredness and a susceptibility to infections.  In addition, the sickled shape of the red blood cells prevents these cells from traveling through some of the small blood vessels.  These trapped cells can stack up and cause blockage.  This blockage can deprive the organs and tissues of enough oxygen, result in episodes of pain, and ultimately cause damage to tissues and vital organs.  We discussed, the pain from these "crises" is unpredictable, can occur in any area of the body, and  episodes can be as infrequent as once per year to as often as many times per year.  Children with SCA can also have delayed growth and more frequent infections due to spleen changes caused by sickled red blood cells becoming trapped in the spleen.   We discussed the availability of hemoglobin electrophoresis for Mr. Mayford Knife in order to provide the most accurate fetal risk assessment.  He understands that  this testing reveals variants of hemoglobin other than hemoglobin S, which when combined with hemoglobin S, can cause a clinically significant hemoglobinopathy.  After thoughtful consideration of this option, this couple declined further testing for Mr. Mayford Knife and the current pregnancy.  They expressed interest in newborn screening for hemoglobinopathies.     Ms. Jani medical history was reviewed.  She denied use of alcohol, tobacco, and illicit substances during this pregnancy.  She denied having significant illnesses or environmental exposures.     Thank you for sharing in the care of Ms. Khushbu Gentry with [place].  Please do not hesitate to contact us if you have any questions or concerns.  I counseled the patient for approximately 30 minutes regarding the above risks and available options.    Donald Prose, MS  Certified Genetic Counselor

## 2010-12-30 NOTE — ED Notes (Signed)
Pt states she takes "medication for thyroid"  But not sure of name or dosage.

## 2011-03-05 ENCOUNTER — Inpatient Hospital Stay (HOSPITAL_COMMUNITY)
Admission: AD | Admit: 2011-03-05 | Discharge: 2011-03-08 | DRG: 781 | Disposition: A | Discharge status: Home / Self Care | Payer: Medicaid Other | Source: Ambulatory Visit | Attending: Obstetrics and Gynecology | Admitting: Obstetrics and Gynecology

## 2011-03-05 ENCOUNTER — Encounter (HOSPITAL_COMMUNITY): Payer: Self-pay | Admitting: *Deleted

## 2011-03-05 DIAGNOSIS — Z3480 Encounter for supervision of other normal pregnancy, unspecified trimester: Secondary | ICD-10-CM

## 2011-03-05 DIAGNOSIS — D573 Sickle-cell trait: Secondary | ICD-10-CM | POA: Diagnosis present

## 2011-03-05 DIAGNOSIS — O2390 Unspecified genitourinary tract infection in pregnancy, unspecified trimester: Principal | ICD-10-CM | POA: Diagnosis present

## 2011-03-05 DIAGNOSIS — O99019 Anemia complicating pregnancy, unspecified trimester: Secondary | ICD-10-CM | POA: Diagnosis present

## 2011-03-05 DIAGNOSIS — O2300 Infections of kidney in pregnancy, unspecified trimester: Secondary | ICD-10-CM

## 2011-03-05 DIAGNOSIS — N12 Tubulo-interstitial nephritis, not specified as acute or chronic: Secondary | ICD-10-CM | POA: Diagnosis present

## 2011-03-05 DIAGNOSIS — A498 Other bacterial infections of unspecified site: Secondary | ICD-10-CM | POA: Diagnosis present

## 2011-03-05 DIAGNOSIS — E0590 Thyrotoxicosis, unspecified without thyrotoxic crisis or storm: Secondary | ICD-10-CM

## 2011-03-05 HISTORY — DX: Headache: R51

## 2011-03-05 HISTORY — DX: Thyrotoxicosis, unspecified without thyrotoxic crisis or storm: E05.90

## 2011-03-05 HISTORY — DX: Sickle-cell trait: D57.3

## 2011-03-05 LAB — DIFFERENTIAL
Basophils Absolute: 0 10*3/uL (ref 0.0–0.1)
Basophils Relative: 0 % (ref 0–1)
Eosinophils Relative: 0 % (ref 0–5)
Lymphocytes Relative: 10 % — ABNORMAL LOW (ref 12–46)
Monocytes Absolute: 1.4 10*3/uL — ABNORMAL HIGH (ref 0.1–1.0)
Monocytes Relative: 8 % (ref 3–12)
Neutro Abs: 13.7 10*3/uL — ABNORMAL HIGH (ref 1.7–7.7)

## 2011-03-05 LAB — URINALYSIS, ROUTINE W REFLEX MICROSCOPIC
Bilirubin Urine: NEGATIVE
Ketones, ur: NEGATIVE mg/dL
Nitrite: NEGATIVE
Protein, ur: NEGATIVE mg/dL

## 2011-03-05 LAB — COMPREHENSIVE METABOLIC PANEL
AST: 12 U/L (ref 0–37)
BUN: <3 mg/dL — ABNORMAL LOW (ref 6–23)
CO2: 23 mEq/L (ref 19–32)
Calcium: 8.8 mg/dL (ref 8.4–10.5)
Chloride: 100 mEq/L (ref 96–112)
Creatinine, Ser: 0.49 mg/dL — ABNORMAL LOW (ref 0.50–1.10)
GFR calc non Af Amer: >90 mL/min (ref >90)
Total Bilirubin: 0.3 mg/dL (ref 0.3–1.2)

## 2011-03-05 LAB — CBC
HCT: 29.3 % — ABNORMAL LOW (ref 36.0–46.0)
Hemoglobin: 10 g/dL — ABNORMAL LOW (ref 12.0–15.0)
MCHC: 34.1 g/dL (ref 30.0–36.0)
MCV: 88.3 fL (ref 78.0–100.0)
RDW: 13 % (ref 11.5–15.5)

## 2011-03-05 MED ORDER — OXYCODONE-ACETAMINOPHEN 5-325 MG PO TABS
1.0000 | ORAL_TABLET | ORAL | Status: DC | PRN
  Administered 2011-03-05: 1 via ORAL
  Administered 2011-03-06 (×2): 2 via ORAL
  Administered 2011-03-06: 1 via ORAL
  Administered 2011-03-06 – 2011-03-08 (×4): 2 via ORAL
  Filled 2011-03-05 (×6): qty 2
  Filled 2011-03-05: qty 1
  Filled 2011-03-05: qty 2
  Filled 2011-03-05: qty 1

## 2011-03-05 MED ORDER — ONDANSETRON HCL 4 MG/2ML IJ SOLN
4.0000 mg | INTRAMUSCULAR | Status: DC | PRN
  Administered 2011-03-05 – 2011-03-07 (×2): 4 mg via INTRAVENOUS
  Filled 2011-03-05 (×2): qty 2

## 2011-03-05 MED ORDER — SODIUM CHLORIDE 0.9 % IV SOLN
INTRAVENOUS | Status: DC
  Administered 2011-03-05 – 2011-03-08 (×7): via INTRAVENOUS

## 2011-03-05 MED ORDER — DEXTROSE 5 % IV SOLN
1.0000 g | Freq: Two times a day (BID) | INTRAVENOUS | Status: DC
  Administered 2011-03-05 – 2011-03-08 (×7): 1 g via INTRAVENOUS
  Filled 2011-03-05 (×7): qty 10

## 2011-03-05 MED ORDER — ACETAMINOPHEN 325 MG PO TABS
650.0000 mg | ORAL_TABLET | ORAL | Status: DC | PRN
  Administered 2011-03-05 – 2011-03-06 (×3): 650 mg via ORAL
  Filled 2011-03-05 (×3): qty 2

## 2011-03-05 MED ORDER — PRENATAL PLUS 27-1 MG PO TABS
1.0000 | ORAL_TABLET | Freq: Every day | ORAL | Status: DC
  Administered 2011-03-06: 1 via ORAL
  Filled 2011-03-05 (×4): qty 1

## 2011-03-05 MED ORDER — LACTATED RINGERS IV SOLN
INTRAVENOUS | Status: DC
  Administered 2011-03-05: 17:00:00 via INTRAVENOUS

## 2011-03-05 MED ORDER — DOCUSATE SODIUM 100 MG PO CAPS
100.0000 mg | ORAL_CAPSULE | Freq: Every day | ORAL | Status: DC
  Administered 2011-03-08: 100 mg via ORAL
  Filled 2011-03-05 (×4): qty 1

## 2011-03-05 MED ORDER — CALCIUM CARBONATE ANTACID 500 MG PO CHEW
2.0000 | CHEWABLE_TABLET | ORAL | Status: DC | PRN
  Administered 2011-03-06 – 2011-03-07 (×3): 400 mg via ORAL
  Filled 2011-03-05 (×4): qty 2

## 2011-03-05 MED ORDER — ZOLPIDEM TARTRATE 10 MG PO TABS
10.0000 mg | ORAL_TABLET | Freq: Every evening | ORAL | Status: DC | PRN
  Administered 2011-03-05 – 2011-03-06 (×2): 10 mg via ORAL
  Filled 2011-03-05 (×2): qty 1

## 2011-03-05 NOTE — ED Notes (Signed)
IV attempt x 3, unsuccessful, IV start to be done on antenatal unit.

## 2011-03-05 NOTE — Progress Notes (Addendum)
Back pain started yesterday, got worse over night.  abd cramping, light fever (99.6 today) and chills- started this morning.  No cough or sore throat.  Denies pain, burning or urgency with urination.

## 2011-03-05 NOTE — H&P (Signed)
See MAU note. 

## 2011-03-05 NOTE — ED Provider Notes (Signed)
History     Chief Complaint  Patient presents with  . Back Pain   HPI This is a 25 year old G3P1011 with interuterine pregnancy at 28 weeks who presents to the MAU with moderate nonradiating right mid to lower back pain that started last week.  It is accompanied with fever that started last night.  She also admits to worsening back pain in the last 24 hours that now radiates to her left flank and up her mid back.   Patient also with fever since headache started but reports typical of her headaches. Denies decreased fetal activity, contractions, vaginal discharge, vaginal bleeding.     OB History    Grav Para Term Preterm Abortions TAB SAB Ect Mult Living   3 1 1  1  1   1       Past Medical History  Diagnosis Date  . Hypothyroidism   . Sickle cell trait   . Headache     Past Surgical History  Procedure Date  . Dilation and curettage of uterus     No family history on file.  History  Substance Use Topics  . Smoking status: Never Smoker   . Smokeless tobacco: Not on file  . Alcohol Use: No    Allergies: No Known Allergies  Prescriptions prior to admission  Medication Sig Dispense Refill  . Prenatal Vit-Fe Fumarate-FA (NATACHEW) 29-1 MG CHEW Chew 1 tablet by mouth daily.          Review of Systems  All other systems reviewed and are negative.   Physical Exam   Blood pressure 106/59, pulse 119, temperature 101.8 F (38.8 C), temperature source Oral, resp. rate 20, height 5' 2.25" (1.581 m), weight 68.947 kg (152 lb), last menstrual period 08/21/2010, SpO2 98.00%.  Physical Exam  Constitutional: She is oriented to person, place, and time. She appears well-developed and well-nourished.  HENT:  Head: Normocephalic and atraumatic.  Eyes: Conjunctivae are normal. Pupils are equal, round, and reactive to light.  Neck: Normal range of motion. Neck supple.  Cardiovascular: Normal rate, regular rhythm and normal heart sounds.   Respiratory: Effort normal and breath  sounds normal. No respiratory distress. She has no wheezes. She has no rales. She exhibits no tenderness.  GI: Soft. Bowel sounds are normal. She exhibits no distension and no mass. There is no tenderness. There is no rebound and no guarding.       Fundal height approximates dates.  Vertex by leopold maneuvers.   Musculoskeletal:       CVA tenderness, right greater than left.  Neurological: She is alert and oriented to person, place, and time.  Skin: Skin is warm and dry. No rash noted. No erythema. No pallor.   NST:  Category 1 with baseline rate of 160s  UA: moderate leukocytes, few squamous cells, many bacteria.  MAU Course  Procedures  Assessment and Plan  #76  25 year old G3P1011 with IUP at 28 weeks #2 Pyelonephritis #3 Hyperthyroidism  Will admit patient to antenatal and start ceftriaxone 1g q 24 hours.  CBC with differential and CMP pending. Will also obtain urine culture.    Grace [name] JEHIEL 03/05/2011, 3:30 PM

## 2011-03-05 NOTE — Progress Notes (Signed)
MD notified of U/A results, matenal HR, temp of 101.8, also FHR tachycardic.

## 2011-03-06 DIAGNOSIS — O2390 Unspecified genitourinary tract infection in pregnancy, unspecified trimester: Secondary | ICD-10-CM

## 2011-03-06 DIAGNOSIS — N12 Tubulo-interstitial nephritis, not specified as acute or chronic: Secondary | ICD-10-CM

## 2011-03-06 MED ORDER — COMPLETENATE 29-1 MG PO CHEW
1.0000 | CHEWABLE_TABLET | Freq: Every day | ORAL | Status: DC
  Administered 2011-03-06 – 2011-03-08 (×3): 1 via ORAL
  Filled 2011-03-06 (×4): qty 1

## 2011-03-06 NOTE — Progress Notes (Signed)
Grace [name] is a 25 y.o. G3P1011 at [redacted]w[redacted]d admitted yesterday for pyelonephritis. She continues to c/o flank pain, right greater than left, some relief with percocet. Denies contractions, vaginal bleeding, LOF. + fetal movement.   O: BP 106/56  Pulse 128  Temp(Src) 100.8 F (38.2 C) (Oral)  Resp 18  Ht 5\' 4"  (1.626 m)  Wt 68.947 kg (152 lb)  BMI 26.09 kg/m2  SpO2 99%  LMP 08/21/2010  Results for orders placed during the hospital encounter of 03/05/11 (from the past 24 hour(s))  URINALYSIS, ROUTINE W REFLEX MICROSCOPIC     Status: Abnormal   Collection Time   03/05/11  1:35 PM      Component Value Range   Color, Urine YELLOW  YELLOW    Appearance HAZY (*) CLEAR    Specific Gravity, Urine 1.015  1.005 - 1.030    pH 7.5  5.0 - 8.0    Glucose, UA NEGATIVE  NEGATIVE (mg/dL)   Hgb urine dipstick NEGATIVE  NEGATIVE    Bilirubin Urine NEGATIVE  NEGATIVE    Ketones, ur NEGATIVE  NEGATIVE (mg/dL)   Protein, ur NEGATIVE  NEGATIVE (mg/dL)   Urobilinogen, UA 0.2  0.0 - 1.0 (mg/dL)   Nitrite NEGATIVE  NEGATIVE    Leukocytes, UA MODERATE (*) NEGATIVE   URINE MICROSCOPIC-ADD ON     Status: Abnormal   Collection Time   03/05/11  1:35 PM      Component Value Range   Squamous Epithelial / LPF FEW (*) RARE    WBC, UA 11-20  <3 (WBC/hpf)   Bacteria, UA MANY (*) RARE   CBC     Status: Abnormal   Collection Time   03/05/11  3:24 PM      Component Value Range   WBC 16.7 (*) 4.0 - 10.5 (K/uL)   RBC 3.32 (*) 3.87 - 5.11 (MIL/uL)   Hemoglobin 10.0 (*) 12.0 - 15.0 (g/dL)   HCT 16.1 (*) 09.6 - 46.0 (%)   MCV 88.3  78.0 - 100.0 (fL)   MCH 30.1  26.0 - 34.0 (pg)   MCHC 34.1  30.0 - 36.0 (g/dL)   RDW 04.5  40.9 - 81.1 (%)   Platelets 361  150 - 400 (K/uL)  DIFFERENTIAL     Status: Abnormal   Collection Time   03/05/11  3:24 PM      Component Value Range   Neutrophils Relative 82 (*) 43 - 77 (%)   Neutro Abs 13.7 (*) 1.7 - 7.7 (K/uL)   Lymphocytes Relative 10 (*) 12 - 46 (%)   Lymphs  Abs 1.6  0.7 - 4.0 (K/uL)   Monocytes Relative 8  3 - 12 (%)   Monocytes Absolute 1.4 (*) 0.1 - 1.0 (K/uL)   Eosinophils Relative 0  0 - 5 (%)   Eosinophils Absolute 0.0  0.0 - 0.7 (K/uL)   Basophils Relative 0  0 - 1 (%)   Basophils Absolute 0.0  0.0 - 0.1 (K/uL)  COMPREHENSIVE METABOLIC PANEL     Status: Abnormal   Collection Time   03/05/11  3:24 PM      Component Value Range   Sodium 133 (*) 135 - 145 (mEq/L)   Potassium 3.4 (*) 3.5 - 5.1 (mEq/L)   Chloride 100  96 - 112 (mEq/L)   CO2 23  19 - 32 (mEq/L)   Glucose, Bld 86  70 - 99 (mg/dL)   BUN <3 (*) 6 - 23 (mg/dL)   Creatinine, Ser 9.14 (*)  0.50 - 1.10 (mg/dL)   Calcium 8.8  8.4 - 16.1 (mg/dL)   Total Protein 6.5  6.0 - 8.3 (g/dL)   Albumin 2.6 (*) 3.5 - 5.2 (g/dL)   AST 12  0 - 37 (U/L)   ALT <5  0 - 35 (U/L)   Alkaline Phosphatase 82  39 - 117 (U/L)   Total Bilirubin 0.3  0.3 - 1.2 (mg/dL)   GFR calc non Af Amer >90  >90 (mL/min)   GFR calc Af Amer >90  >90 (mL/min)    Physical Exam  Vitals reviewed. Constitutional: She is oriented to person, place, and time. She appears well-developed and well-nourished. No distress.  Cardiovascular: Normal rate.   Pulmonary/Chest: Effort normal. No respiratory distress.  Abdominal: Soft. There is no tenderness. There is CVA tenderness (right greater than left).  Musculoskeletal: Normal range of motion.  Neurological: She is alert and oriented to person, place, and time.  Skin: Skin is warm and dry.  Psychiatric: She has a normal mood and affect.   A/P: 25 y.o. G3P1011 at [redacted]w[redacted]d with pyelonephritis Continue Rocephin Urine culture pending

## 2011-03-07 LAB — URINE CULTURE
Colony Count: >=100000
Culture  Setup Time: 201211162322

## 2011-03-07 MED ORDER — FAMOTIDINE IN NACL 20-0.9 MG/50ML-% IV SOLN
20.0000 mg | Freq: Two times a day (BID) | INTRAVENOUS | Status: DC
  Administered 2011-03-07: 20 mg via INTRAVENOUS
  Filled 2011-03-07 (×2): qty 50

## 2011-03-07 MED ORDER — MAGNESIUM HYDROXIDE 400 MG/5ML PO SUSP
30.0000 mL | Freq: Every evening | ORAL | Status: DC | PRN
  Administered 2011-03-07: 30 mL via ORAL
  Filled 2011-03-07: qty 30

## 2011-03-07 NOTE — Progress Notes (Signed)
  Grace [name] is a 25 y.o. G3P1011 at [redacted]w[redacted]d admitted on 03/05/11 for pyelonephritis. She continues to have flank pain, right greater than left, some relief with percocet.  Denies contractions, vaginal bleeding, LOF. + fetal movement.   O:BP 99/47  Pulse 119  Temp(Src) 98.7 F (37.1 C) (Oral)  Resp 16  Ht 5\' 4"  (1.626 m)  Wt 68.947 kg (152 lb)  BMI 26.09 kg/m2  SpO2 98%  LMP 08/21/2010 Filed Vitals:   03/06/11 1945 03/06/11 2254 03/06/11 2255 03/07/11 0339  BP: 103/52  103/58 99/47  Pulse: 129 120 120 119  Temp: 100.9 F (38.3 C)  98.8 F (37.1 C) 98.7 F (37.1 C)  TempSrc: Oral  Oral Oral  Resp: 20  18 16   Height:      Weight:      SpO2: 99% 99%  98%   Urine Culture with >100K E.coli sensitive to Ceftriaxone and Cefazolin  Physical Exam  Vitals reviewed. Constitutional: She is oriented to person, place, and time. She appears well-developed and well-nourished. No distress.  Cardiovascular: Normal rate.   Pulmonary/Chest: Effort normal. No respiratory distress.  Abdominal: Soft. There is no tenderness. There is CVA tenderness (right greater than left).  Musculoskeletal: Normal range of motion.  Neurological: She is alert and oriented to person, place, and time.  Skin: Skin is warm and dry.  Psychiatric: She has a normal mood and affect.   A/P:25 y.o. [id number] at [redacted]w[redacted]d with E. Coli pyelonephritis Continue Rocephin bid until at least 48 hours afebrile Routine antenatal care

## 2011-03-08 LAB — GLUCOSE, 2 HOUR GESTATIONAL: Glucose Tolerance, 2 hour: 111 mg/dL (ref 70–164)

## 2011-03-08 LAB — GLUCOSE, 3 HOUR GESTATIONAL: Glucose, GTT - 3 Hour: 115 mg/dL (ref 70–144)

## 2011-03-08 MED ORDER — OXYCODONE-ACETAMINOPHEN 5-325 MG PO TABS: 1.0000 | ORAL_TABLET | ORAL | Status: AC | PRN

## 2011-03-08 MED ORDER — CEPHALEXIN 500 MG PO CAPS: 500.0000 mg | ORAL_CAPSULE | Freq: Four times a day (QID) | ORAL | Status: AC

## 2011-03-08 MED ORDER — FAMOTIDINE 20 MG PO TABS
20.0000 mg | ORAL_TABLET | Freq: Two times a day (BID) | ORAL | Status: DC
Start: 2011-03-08 — End: 2011-03-08
  Administered 2011-03-08: 20 mg via ORAL
  Filled 2011-03-08: qty 1

## 2011-03-08 NOTE — Progress Notes (Signed)
FACULTY PRACTICE ANTEPARTUM NOTE  Grace [name] is a 25 y.o. G3P1011 at [redacted]w[redacted]d  who is admitted for pyelonephritis.   Length of Stay:  3  Days  Subjective:  Patient complains of non-radiating back pain that increased last night.  She also has a mild headache.  Patient reports good fetal movement.  She reports no uterine contractions, no bleeding and no loss of fluid per vagina.  Vitals:  Blood pressure 102/54, pulse 106, temperature 97.7 F (36.5 C), temperature source Oral, resp. rate 18, height 5\' 4"  (1.626 m), weight 68.947 kg (152 lb), last menstrual period 08/21/2010, SpO2 97.00%. Physical Examination:  General appearance - alert, well appearing, and in no distress Chest - respiratory effort normal.  No pulmonary distress. Heart - regular rate. Abdomen - soft, nontender, nondistended, no masses or organomegaly Extremities - peripheral pulses normal, no pedal edema, no clubbing or cyanosis Skin - normal coloration and turgor, no rashes, no suspicious skin lesions noted  Labs:  Recent Results (from the past 24 hour(s))  GLUCOSE, FASTING GESTATIONAL   Collection Time   03/08/11  5:40 AM      Component Value Range   Glucose, Fasting-Gestational 70     Medications:  Scheduled    . cefTRIAXone (ROCEPHIN) IV  1 g Intravenous Q12H  . docusate sodium  100 mg Oral Daily  . famotidine  20 mg Oral BID  . prenatal vitamin w/FE, FA  1 tablet Oral Daily  . DISCONTD: famotidine (PEPCID) IV  20 mg Intravenous Q12H   I have reviewed the patient's current medications.  ASSESSMENT: Patient Active Problem List  Diagnoses  . Sickle cell trait  . Pyelonephritis complicating pregnancy    PLAN: #1 25 yo G3P1011 with IUP at 28 weeks 3 days #2 E. Coli pyelonephritis.  Will be 48hours afebrile at 1945.   Continue ceftriaxone IV q12hours. 3hr GTT in process. Tylenol for back pain after 3 hr GTT. Continue routine antenatal care. D/c tomorrow if no further fever.  STINSON, JACOB  JEHIEL 03/08/2011,7:51 AM

## 2011-03-08 NOTE — Discharge Summary (Signed)
Physician Discharge Summary  Patient ID: Grace [name] MRN: [id number] DOB/AGE: 24-Dec-1985 25 y.o.  Admit date: 03/05/2011 Discharge date: 03/08/2011  Admission Diagnoses:  IUP at [redacted]w[redacted]d                                          Pyelonephritis  Discharge Diagnoses:  Active Problems:  Pyelonephritis complicating pregnancy   Discharged Condition: good  Hospital Course: This is a 25 year old G3P1011 with interuterine pregnancy at 28 weeks who presented to the MAU with moderate nonradiating right mid to lower back pain that started last week. It was accompanied with fever that started the previous night. She also admited to worsening back pain in the last 24 hours that now radiates to her left flank and up her mid back. She was admitted for antibiotics (Ceftriaxone) and urine was sent to culture. It was positive for e. Coli. She has been afebrile for 48 hours now and is deemed to have received the full benefit of her hospital stay and will be discharged home.    Consults: none  Significant Diagnostic Studies:   Treatments: IV hydration, antibiotics: ceftriaxone and analgesia: percocet  Discharge Exam: Blood pressure 94/55, pulse 95, temperature 98.3 F (36.8 C), temperature source Oral, resp. rate 20, height 5\' 4"  (1.626 m), weight 68.947 kg (152 lb), last menstrual period 08/21/2010, SpO2 97.00%. General appearance: alert, cooperative and no distress Back: symmetric, no curvature. ROM normal. No CVA tenderness. Cardio: regular rate and rhythm, S1, S2 normal, no murmur, click, rub or gallop GI: soft, non-tender; bowel sounds normal; no masses,  no organomegaly  Disposition: Home or Self Care   Current Discharge Medication List    START taking these medications   Details  cephALEXin (KEFLEX) 500 MG capsule Take 1 capsule (500 mg total) by mouth 4 (four) times daily. Qty: 28 capsule, Refills: 0    oxyCODONE-acetaminophen (PERCOCET) 5-325 MG per tablet Take 1 tablet by mouth  every 4 (four) hours as needed. Qty: 10 tablet, Refills: 0      CONTINUE these medications which have NOT CHANGED   Details  Prenatal Vit-Fe Fumarate-FA (NATACHEW) 29-1 MG CHEW Chew 1 tablet by mouth daily.         Follow-up Information    Follow up with [facility]. Make an appointment in 1 week.       I will send an email to reschedule today's appointment.   SignedWynelle Bourgeois 03/08/2011, 6:29 PM

## 2011-03-08 NOTE — Progress Notes (Signed)
UR chart review completed.  

## 2011-03-16 DIAGNOSIS — O2300 Infections of kidney in pregnancy, unspecified trimester: Secondary | ICD-10-CM

## 2011-03-16 DIAGNOSIS — E0590 Thyrotoxicosis, unspecified without thyrotoxic crisis or storm: Secondary | ICD-10-CM

## 2011-03-16 DIAGNOSIS — E079 Disorder of thyroid, unspecified: Secondary | ICD-10-CM | POA: Insufficient documentation

## 2011-03-16 DIAGNOSIS — D573 Sickle-cell trait: Secondary | ICD-10-CM

## 2011-03-22 ENCOUNTER — Ambulatory Visit (INDEPENDENT_AMBULATORY_CARE_PROVIDER_SITE_OTHER): Payer: Medicaid Other | Admitting: Advanced Practice Midwife

## 2011-03-22 ENCOUNTER — Other Ambulatory Visit: Payer: Self-pay | Admitting: Obstetrics & Gynecology

## 2011-03-22 DIAGNOSIS — D573 Sickle-cell trait: Secondary | ICD-10-CM

## 2011-03-22 DIAGNOSIS — N12 Tubulo-interstitial nephritis, not specified as acute or chronic: Secondary | ICD-10-CM

## 2011-03-22 DIAGNOSIS — E0590 Thyrotoxicosis, unspecified without thyrotoxic crisis or storm: Secondary | ICD-10-CM

## 2011-03-22 DIAGNOSIS — O2390 Unspecified genitourinary tract infection in pregnancy, unspecified trimester: Secondary | ICD-10-CM

## 2011-03-22 DIAGNOSIS — O2300 Infections of kidney in pregnancy, unspecified trimester: Secondary | ICD-10-CM

## 2011-03-22 DIAGNOSIS — O09A0 Supervision of pregnancy with history of molar pregnancy, unspecified trimester: Secondary | ICD-10-CM

## 2011-03-22 LAB — POCT URINALYSIS DIP (DEVICE)
Bilirubin Urine: NEGATIVE
Glucose, UA: NEGATIVE mg/dL
Nitrite: NEGATIVE
Urobilinogen, UA: 0.2 mg/dL (ref 0.0–1.0)

## 2011-03-22 MED ORDER — HYDROCODONE-ACETAMINOPHEN 5-500 MG PO TABS: 1.0000 | ORAL_TABLET | Freq: Four times a day (QID) | ORAL | Status: AC | PRN

## 2011-03-22 NOTE — Progress Notes (Signed)
U/S Apr 21, 2011 at 9 am.

## 2011-03-22 NOTE — Progress Notes (Signed)
Transferred here for Hyperthyroidism.. Followed by endocrine.  Labs and treatment pending.   Does not have diabetes.  FOB is same as first child, told Dentist he did not have SCT, but has never been tested (first child has disease). Plan [place] at 32 weeks for growth.

## 2011-03-22 NOTE — Patient Instructions (Signed)
Pregnancy - Third Trimester The third trimester of pregnancy (the last 3 months) is a period of the most rapid growth for you and your baby. The baby approaches a length of 20 inches and a weight of 6 to 10 pounds. The baby is adding on fat and getting ready for life outside your body. While inside, babies have periods of sleeping and waking, suck their thumbs, and hiccups. You can often feel small contractions of the uterus. This is false labor. It is also called Braxton-Hicks contractions. This is like a practice for labor. The usual problems in this stage of pregnancy include more difficulty breathing, swelling of the hands and feet from water retention, and having to urinate more often because of the uterus and baby pressing on your bladder.  PRENATAL EXAMS  Blood work may continue to be done during prenatal exams. These tests are done to check on your health and the probable health of your baby. Blood work is used to follow your blood levels (hemoglobin). Anemia (low hemoglobin) is common during pregnancy. Iron and vitamins are given to help prevent this. You may also continue to be checked for diabetes. Some of the past blood tests may be done again.   The size of the uterus is measured during each visit. This makes sure your baby is growing properly according to your pregnancy dates.   Your blood pressure is checked every prenatal visit. This is to make sure you are not getting toxemia.   Your urine is checked every prenatal visit for infection, diabetes and protein.   Your weight is checked at each visit. This is done to make sure gains are happening at the suggested rate and that you and your baby are growing normally.   Sometimes, an ultrasound is performed to confirm the position and the proper growth and development of the baby. This is a test done that bounces harmless sound waves off the baby so your caregiver can more accurately determine due dates.   Discuss the type of pain  medication and anesthesia you will have during your labor and delivery.   Discuss the possibility and anesthesia if a Cesarean Section might be necessary.   Inform your caregiver if there is any mental or physical violence at home.  Sometimes, a specialized non-stress test, contraction stress test and biophysical profile are done to make sure the baby is not having a problem. Checking the amniotic fluid surrounding the baby is called an amniocentesis. The amniotic fluid is removed by sticking a needle into the belly (abdomen). This is sometimes done near the end of pregnancy if an early delivery is required. In this case, it is done to help make sure the baby's lungs are mature enough for the baby to live outside of the womb. If the lungs are not mature and it is unsafe to deliver the baby, an injection of cortisone medication is given to the mother 1 to 2 days before the delivery. This helps the baby's lungs mature and makes it safer to deliver the baby. CHANGES OCCURING IN THE THIRD TRIMESTER OF PREGNANCY Your body goes through many changes during pregnancy. They vary from person to person. Talk to your caregiver about changes you notice and are concerned about.  During the last trimester, you have probably had an increase in your appetite. It is normal to have cravings for certain foods. This varies from person to person and pregnancy to pregnancy.   You may begin to get stretch marks on your hips,   abdomen, and breasts. These are normal changes in the body during pregnancy. There are no exercises or medications to take which prevent this change.   Constipation may be treated with a stool softener or adding bulk to your diet. Drinking lots of fluids, fiber in vegetables, fruits, and whole grains are helpful.   Exercising is also helpful. If you have been very active up until your pregnancy, most of these activities can be continued during your pregnancy. If you have been less active, it is helpful  to start an exercise program such as walking. Consult your caregiver before starting exercise programs.   Avoid all smoking, alcohol, un-prescribed drugs, herbs and "street drugs" during your pregnancy. These chemicals affect the formation and growth of the baby. Avoid chemicals throughout the pregnancy to ensure the delivery of a healthy infant.   Backache, varicose veins and hemorrhoids may develop or get worse.   You will tire more easily in the third trimester, which is normal.   The baby's movements may be stronger and more often.   You may become short of breath easily.   Your belly button may stick out.   A yellow discharge may leak from your breasts called colostrum.   You may have a bloody mucus discharge. This usually occurs a few days to a week before labor begins.  HOME CARE INSTRUCTIONS   Keep your caregiver's appointments. Follow your caregiver's instructions regarding medication use, exercise, and diet.   During pregnancy, you are providing food for you and your baby. Continue to eat regular, well-balanced meals. Choose foods such as meat, fish, milk and other low fat dairy products, vegetables, fruits, and whole-grain breads and cereals. Your caregiver will tell you of the ideal weight gain.   A physical sexual relationship may be continued throughout pregnancy if there are no other problems such as early (premature) leaking of amniotic fluid from the membranes, vaginal bleeding, or belly (abdominal) pain.   Exercise regularly if there are no restrictions. Check with your caregiver if you are unsure of the safety of your exercises. Greater weight gain will occur in the last 2 trimesters of pregnancy. Exercising helps:   Control your weight.   Get you in shape for labor and delivery.   You lose weight after you deliver.   Rest a lot with legs elevated, or as needed for leg cramps or low back pain.   Wear a good support or jogging bra for breast tenderness during  pregnancy. This may help if worn during sleep. Pads or tissues may be used in the bra if you are leaking colostrum.   Do not use hot tubs, steam rooms, or saunas.   Wear your seat belt when driving. This protects you and your baby if you are in an accident.   Avoid raw meat, cat litter boxes and soil used by cats. These carry germs that can cause birth defects in the baby.   It is easier to loose urine during pregnancy. Tightening up and strengthening the pelvic muscles will help with this problem. You can practice stopping your urination while you are going to the bathroom. These are the same muscles you need to strengthen. It is also the muscles you would use if you were trying to stop from passing gas. You can practice tightening these muscles up 10 times a set and repeating this about 3 times per day. Once you know what muscles to tighten up, do not perform these exercises during urination. It is more likely   to cause an infection by backing up the urine.   Ask for help if you have financial, counseling or nutritional needs during pregnancy. Your caregiver will be able to offer counseling for these needs as well as refer you for other special needs.   Make a list of emergency phone numbers and have them available.   Plan on getting help from family or friends when you go home from the hospital.   Make a trial run to the hospital.   Take prenatal classes with the father to understand, practice and ask questions about the labor and delivery.   Prepare the baby's room/nursery.   Do not travel out of the city unless it is absolutely necessary and with the advice of your caregiver.   Wear only low or no heal shoes to have better balance and prevent falling.  MEDICATIONS AND DRUG USE IN PREGNANCY  Take prenatal vitamins as directed. The vitamin should contain 1 milligram of folic acid. Keep all vitamins out of reach of children. Only a couple vitamins or tablets containing iron may be fatal  to a baby or young child when ingested.   Avoid use of all medications, including herbs, over-the-counter medications, not prescribed or suggested by your caregiver. Only take over-the-counter or prescription medicines for pain, discomfort, or fever as directed by your caregiver. Do not use aspirin, ibuprofen (Motrin, Advil, Nuprin) or naproxen (Aleve) unless OK'd by your caregiver.   Let your caregiver also know about herbs you may be using.   Alcohol is related to a number of birth defects. This includes fetal alcohol syndrome. All alcohol, in any form, should be avoided completely. Smoking will cause low birth rate and premature babies.   Street/illegal drugs are very harmful to the baby. They are absolutely forbidden. A baby born to an addicted mother will be addicted at birth. The baby will go through the same withdrawal an adult does.  SEEK MEDICAL CARE IF: You have any concerns or worries during your pregnancy. It is better to call with your questions if you feel they cannot wait, rather than worry about them. DECISIONS ABOUT CIRCUMCISION You may or may not know the sex of your baby. If you know your baby is a boy, it may be time to think about circumcision. Circumcision is the removal of the foreskin of the penis. This is the skin that covers the sensitive end of the penis. There is no proven medical need for this. Often this decision is made on what is popular at the time or based upon religious beliefs and social issues. You can discuss these issues with your caregiver or pediatrician. SEEK IMMEDIATE MEDICAL CARE IF:   An unexplained oral temperature above 102 F (38.9 C) develops, or as your caregiver suggests.   You have leaking of fluid from the vagina (birth canal). If leaking membranes are suspected, take your temperature and tell your caregiver of this when you call.   There is vaginal spotting, bleeding or passing clots. Tell your caregiver of the amount and how many pads are  used.   You develop a bad smelling vaginal discharge with a change in the color from clear to white.   You develop vomiting that lasts more than 24 hours.   You develop chills or fever.   You develop shortness of breath.   You develop burning on urination.   You loose more than 2 pounds of weight or gain more than 2 pounds of weight or as suggested by your   caregiver.   You notice sudden swelling of your face, hands, and feet or legs.   You develop belly (abdominal) pain. Round ligament discomfort is a common non-cancerous (benign) cause of abdominal pain in pregnancy. Your caregiver still must evaluate you.   You develop a severe headache that does not go away.   You develop visual problems, blurred or double vision.   If you have not felt your baby move for more than 1 hour. If you think the baby is not moving as much as usual, eat something with sugar in it and lie down on your left side for an hour. The baby should move at least 4 to 5 times per hour. Call right away if your baby moves less than that.   You fall, are in a car accident or any kind of trauma.   There is mental or physical violence at home.  Document Released: 03/30/2001 Document Revised: 12/16/2010 Document Reviewed: 10/02/2008 [facility] Patient Information 2012 [place], [place].Hyperthyroidism The thyroid is a large gland located in the lower front part of your neck. The thyroid helps control metabolism. Metabolism is how your body uses food. It controls metabolism with the hormone thyroxine. When the thyroid is overactive, it produces too much hormone. When this happens, these following problems may occur:   Nervousness   Heat intolerance   Weight loss (in spite of increase food intake)   Diarrhea   Change in hair or skin texture   Palpitations (heart skipping or having extra beats)   Tachycardia (rapid heart rate)   Loss of menstruation (amenorrhea)   Shaking of the hands  CAUSES  Grave's Disease  (the immune system attacks the thyroid gland). This is the most common cause.   Inflammation of the thyroid gland.   Tumor (usually benign) in the thyroid gland or elsewhere.   Excessive use of thyroid medications (both prescription and 'natural').   Excessive ingestion of Iodine.  DIAGNOSIS  To prove hyperthyroidism, your caregiver may do blood tests and ultrasound tests. Sometimes the signs are hidden. It may be necessary for your caregiver to watch this illness with blood tests, either before or after diagnosis and treatment. TREATMENT Short-term treatment There are several treatments to control symptoms. Drugs called beta blockers may give some relief. Drugs that decrease hormone production will provide temporary relief in many people. These measures will usually not give permanent relief. Definitive therapy There are treatments available which can be discussed between you and your caregiver which will permanently treat the problem. These treatments range from surgery (removal of the thyroid), to the use of radioactive iodine (destroys the thyroid by radiation), to the use of antithyroid drugs (interfere with hormone synthesis). The first two treatments are permanent and usually successful. They most often require hormone replacement therapy for life. This is because it is impossible to remove or destroy the exact amount of thyroid required to make a person euthyroid (normal). HOME CARE INSTRUCTIONS  See your caregiver if the problems you are being treated for get worse. Examples of this would be the problems listed above. SEEK MEDICAL CARE IF: Your general condition worsens. MAKE SURE YOU:   Understand these instructions.   Will watch your condition.   Will get help right away if you are not doing well or get worse.  Document Released: 04/05/2005 Document Revised: 12/16/2010 Document Reviewed: 08/17/2006 [facility] Patient Information 2012 [place], [place].

## 2011-03-22 NOTE — Progress Notes (Signed)
Swelling in feet, ankles, and face. Upper abdominal pain. No vaginal discharge. Pulse 105. Pt signed  Consent for Tdap. Pt to see nutrition and Child psychotherapist.

## 2011-03-22 NOTE — Progress Notes (Signed)
U/S scheduled 04/05/11 at 8 am.

## 2011-04-03 ENCOUNTER — Other Ambulatory Visit: Payer: Self-pay

## 2011-04-03 ENCOUNTER — Encounter (HOSPITAL_COMMUNITY): Payer: Self-pay | Admitting: *Deleted

## 2011-04-03 ENCOUNTER — Inpatient Hospital Stay (HOSPITAL_COMMUNITY)
Admission: EM | Admit: 2011-04-03 | Discharge: 2011-04-04 | Disposition: A | Discharge status: Home / Self Care | Payer: Medicaid Other | Attending: Obstetrics and Gynecology | Admitting: Obstetrics and Gynecology

## 2011-04-03 ENCOUNTER — Emergency Department (HOSPITAL_COMMUNITY): Payer: Medicaid Other

## 2011-04-03 DIAGNOSIS — O479 False labor, unspecified: Secondary | ICD-10-CM

## 2011-04-03 DIAGNOSIS — R079 Chest pain, unspecified: Secondary | ICD-10-CM | POA: Insufficient documentation

## 2011-04-03 DIAGNOSIS — O99891 Other specified diseases and conditions complicating pregnancy: Secondary | ICD-10-CM | POA: Insufficient documentation

## 2011-04-03 DIAGNOSIS — R109 Unspecified abdominal pain: Secondary | ICD-10-CM | POA: Insufficient documentation

## 2011-04-03 DIAGNOSIS — O4700 False labor before 37 completed weeks of gestation, unspecified trimester: Secondary | ICD-10-CM | POA: Insufficient documentation

## 2011-04-03 DIAGNOSIS — R0602 Shortness of breath: Secondary | ICD-10-CM | POA: Insufficient documentation

## 2011-04-03 LAB — URINALYSIS, ROUTINE W REFLEX MICROSCOPIC
Leukocytes, UA: NEGATIVE
Nitrite: NEGATIVE
Protein, ur: NEGATIVE mg/dL
Specific Gravity, Urine: 1.01 (ref 1.005–1.030)
Urobilinogen, UA: 1 mg/dL (ref 0.0–1.0)

## 2011-04-03 LAB — BASIC METABOLIC PANEL
CO2: 19 mEq/L (ref 19–32)
Calcium: 8.8 mg/dL (ref 8.4–10.5)
Chloride: 101 mEq/L (ref 96–112)
Glucose, Bld: 73 mg/dL (ref 70–99)
Potassium: 3.8 mEq/L (ref 3.5–5.1)
Sodium: 133 mEq/L — ABNORMAL LOW (ref 135–145)

## 2011-04-03 LAB — DIFFERENTIAL
Basophils Relative: 0 % (ref 0–1)
Eosinophils Absolute: 0 10*3/uL (ref 0.0–0.7)
Monocytes Absolute: 0.9 10*3/uL (ref 0.1–1.0)
Monocytes Relative: 8 % (ref 3–12)
Neutrophils Relative %: 87 % — ABNORMAL HIGH (ref 43–77)

## 2011-04-03 LAB — CBC
MCV: 86 fL (ref 78.0–100.0)
Platelets: 291 10*3/uL (ref 150–400)
RDW: 13.7 % (ref 11.5–15.5)
WBC: 12 10*3/uL — ABNORMAL HIGH (ref 4.0–10.5)

## 2011-04-03 MED ORDER — OXYCODONE-ACETAMINOPHEN 5-325 MG PO TABS
1.0000 | ORAL_TABLET | Freq: Once | ORAL | Status: AC
  Administered 2011-04-03: 1 via ORAL
  Filled 2011-04-03: qty 1

## 2011-04-03 MED ORDER — LACTATED RINGERS IV BOLUS (SEPSIS)
1000.0000 mL | Freq: Once | INTRAVENOUS | Status: AC
  Administered 2011-04-03: 1000 mL via INTRAVENOUS

## 2011-04-03 MED ORDER — IOHEXOL 350 MG/ML SOLN
80.0000 mL | Freq: Once | INTRAVENOUS | Status: AC | PRN
  Administered 2011-04-03: 80 mL via INTRAVENOUS

## 2011-04-03 MED ORDER — SODIUM CHLORIDE 0.9 % IV BOLUS (SEPSIS)
1000.0000 mL | Freq: Once | INTRAVENOUS | Status: AC
  Administered 2011-04-03: 1000 mL via INTRAVENOUS

## 2011-04-03 NOTE — ED Provider Notes (Signed)
History     CSN: [id number] Arrival date & time: 04/03/2011  5:05 PM   First MD Initiated Contact with Patient 04/03/11 1712      Chief Complaint  Patient presents with  . Abdominal Cramping    (Consider location/radiation/quality/duration/timing/severity/associated sxs/prior treatment) Patient is a 25 y.o. female presenting with cramps. The history is provided by the patient.  Abdominal Cramping The primary symptoms of the illness include abdominal pain and fever (subjective). The primary symptoms of the illness do not include shortness of breath, nausea, vomiting, dysuria, vaginal discharge or vaginal bleeding. The current episode started 1 to 2 hours ago (at 1500). The onset of the illness was gradual. The problem has been gradually worsening.  The abdominal pain is located in the suprapubic region (generalized). The severity of the abdominal pain is 10/10. The abdominal pain is relieved by nothing.  The fever began today. The maximum temperature recorded prior to her arrival was unknown.  The patient states that she believes she is currently pregnant. The patient has not had a change in bowel habit. Additional symptoms associated with the illness include chills and back pain. Symptoms associated with the illness do not include constipation, urgency, hematuria or frequency.    Past Medical History  Diagnosis Date  . Sickle cell trait   . Headache   . Hyperthyroidism     Past Surgical History  Procedure Date  . Dilation and curettage of uterus     Family History  Problem Relation Age of Onset  . Diabetes Mother   . Cancer Father   . Diabetes Maternal Uncle   . Cancer Maternal Grandmother   . Cancer Maternal Grandfather   . Hypertension Maternal Grandfather   . Diabetes Maternal Grandfather   . Sickle cell anemia Son     History  Substance Use Topics  . Smoking status: Never Smoker   . Smokeless tobacco: Not on file  . Alcohol Use: No    OB History    Grav Para  Term Preterm Abortions TAB SAB Ect Mult Living   3 1 1  1  1   1       Review of Systems  Constitutional: Positive for fever (subjective) and chills.  Respiratory: Positive for cough. Negative for shortness of breath.   Cardiovascular: Negative for chest pain and palpitations.  Gastrointestinal: Positive for abdominal pain. Negative for nausea, vomiting and constipation.  Genitourinary: Negative for dysuria, urgency, frequency, hematuria, vaginal bleeding, vaginal discharge and difficulty urinating.  Musculoskeletal: Positive for back pain.  Skin: Negative for color change and rash.  Neurological: Negative for light-headedness and headaches.  All other systems reviewed and are negative.    Allergies  Review of patient's allergies indicates no known allergies.  Home Medications   Current Outpatient Rx  Name Route Sig Dispense Refill  . NATACHEW 29-1 MG PO CHEW Oral Chew 1 tablet by mouth daily.      Marland Kitchen RANITIDINE HCL 150 MG PO TABS Oral Take 150 mg by mouth 2 (two) times daily.        BP 97/47  Pulse 136  Temp(Src) 100.1 F (37.8 C) (Oral)  Resp 20  SpO2 99%  LMP 08/21/2010  Physical Exam  Nursing note and vitals reviewed. Constitutional: She is oriented to person, place, and time. She appears well-developed and well-nourished.  HENT:  Head: Normocephalic and atraumatic.  Eyes: Pupils are equal, round, and reactive to light.  Cardiovascular: Normal rate, regular rhythm, normal heart sounds and intact distal pulses.  Pulmonary/Chest: Effort normal and breath sounds normal. No respiratory distress.  Abdominal: Soft. She exhibits no distension. There is tenderness (very mild, diffuse).       Gravid uterus  Neurological: She is alert and oriented to person, place, and time.  Skin: Skin is warm and dry.  Psychiatric: She has a normal mood and affect.    ED Course  Procedures (including critical care time)  Labs Reviewed  URINALYSIS, ROUTINE W REFLEX MICROSCOPIC -  Abnormal; Notable for the following:    Ketones, ur 40 (*)    All other components within normal limits  CBC - Abnormal; Notable for the following:    WBC 12.0 (*)    RBC 3.44 (*)    Hemoglobin 10.1 (*)    HCT 29.6 (*)    All other components within normal limits  DIFFERENTIAL - Abnormal; Notable for the following:    Neutrophils Relative 87 (*)    Neutro Abs 10.4 (*)    Lymphocytes Relative 6 (*)    All other components within normal limits  BASIC METABOLIC PANEL - Abnormal; Notable for the following:    Sodium 133 (*)    BUN 3 (*)    All other components within normal limits   Ct Angio Chest W/cm &/or Wo Cm  04/03/2011  *RADIOLOGY REPORT*  Clinical Data:  Pregnant, shortness of breath, chest pain.  CT ANGIOGRAPHY CHEST WITH CONTRAST  Technique:  Multidetector CT imaging of the chest was performed using the standard protocol during bolus administration of intravenous contrast.  Multiplanar CT image reconstructions including MIPs were obtained to evaluate the vascular anatomy.  Contrast: 80mL OMNIPAQUE IOHEXOL 350 MG/ML IV SOLN  Comparison:  12/31/2004 radiograph  Findings:  There are no main, lobar, or segmental branch filling defects.  Subsegmental branches are suboptimally evaluated due to contrast bolus timing.  The heart size is within normal limits. There is trace pericardial fluid versus thickening.  The esophagus is distended with fluid and mildly dilated distally.  No intrathoracic lymphadenopathy.  Normal caliber aorta and great vessels.  Central airways are patent.  Lungs are clear.  No pneumothorax.  Limited images through the upper abdomen show no acute abnormality.  No acute osseous abnormality.  Review of the MIP images confirms the above findings.  IMPRESSION: No pulmonary embolism identified. Trace pericardial fluid versus thickening. Otherwise, no acute cardiopulmonary process.  Fluid distended esophagus may represent gastroesophageal reflux disease.  Original Report  Authenticated By: Waneta Martins, M.D.     Date: 04/03/2011  Rate: 127  Rhythm: sinus tachycardia  QRS Axis: normal  Intervals: normal  ST/T Wave abnormalities: normal  Conduction Disutrbances:none  Narrative Interpretation: sinus tach, otherwise unremarkable  Old EKG Reviewed: none available   1. Preterm contractions       MDM  This is a 25 year old female who is [redacted] weeks pregnant, and presents today due to onset of intermittent abdominal pain at about 1500 today. She states that the pain occurs about every 5-10 minutes, lasts for about 5 minutes before going away, it is a crampy type pain, however does not feel like contractions. During these episodes of pain she is also experiencing pain in her lower back, and occasionally feels like her abdomen is getting "hard." She says that she woke up this morning with a cough and subjective fever, her son is currently admitted to the hospital for what sounds like concern for flu versus pneumonia, he was admitted initially for cough and respiratory symptoms, and per the patient  is now in the hospital on antibiotics and oxygen, but is stable; his initial respiratory symptoms are similar to the ones she began experiencing today. She is G3 P1, with a term pregnancy, however did experience preterm contractions at the start of her eighth month, requiring unknown medications to stop the contractions; her other pregnancy was a molar pregnancy. She denies any problems with this current pregnancy. She denies any dysuria, frequency, vaginal discharge, vaginal bleeding, feeling like her water has broken. Exam is remarkable for mild diffuse tenderness to the abdomen and her back. Will check her urine, start on tocometry to evaluate for possible contractions.  No signs of contractions noted on monitor, fetal heart rate around 160 with good variability. Patient's urine does not show signs of infection. Patient complaining of shortness of breath, persistent upper  back pain, chest pain; in light of her being pregnant as well as the tachycardia was noted on her vital signs, do have concern for possible PE.  Scan is unremarkable for PE. Upon return to the room after receiving the scan, began complaining of recurrence of abdominal cramping, as well as some pressure sensation, which she states are more consistent with contractions. Toco monitor does show contractions; there are no signs of fetal distress on the monitor. Believe that patient's diffuse symptoms are possibly due to the flu given her son's current illness. Discussed the patient with her on-call obstetrician (Dr. Jolayne Panther), and the patient will be transferred to [facility] for further evaluation and treatment given her contractions. The patient expresses understanding of this plan.    Theotis Burrow, MD 04/03/11 2356  Theotis Burrow, MD 04/03/11 1610

## 2011-04-03 NOTE — ED Notes (Signed)
OB Rn at beside with pt.

## 2011-04-03 NOTE — ED Notes (Signed)
Patient is [redacted] week pregnant and started having abdominal pain around 3pm and has been progressively getting worse. Pain is located on her lower abdomen radiating to her lower back.  She is also has been having fever today.

## 2011-04-03 NOTE — ED Notes (Signed)
Report received from [facility] RN, will assume care of pt. 1st contact with pt and will continue to monitor pt.

## 2011-04-03 NOTE — Progress Notes (Signed)
Dr. Jolayne Panther notified of pt pain level change, SVE and uc pattern. Pt to be transferred to [facility] for further evaluation

## 2011-04-03 NOTE — ED Notes (Signed)
Pt reports "waking up with fever, chills, coughing, chest pain and shortness of breath and it just got worst through out the day". Pt is 32.[redacted] weeks pregnant with no vaginal bleeding or discharge. Pt reports having lower abdominal and back pain 10/10 since about 3 p.m. Today, Pt INAD, resp e/u, skin w/d and A/O x3. Pt is tachycardic at this time 120's on fetal monitor with fetal heart rate 186 at this time. Pt has IV NS infusing with no s/s of infiltration, pt is awaiting further testing CT. Plan of care is updated with verbal understanding, OB RN is at bedside periodically for fetal monitoring.

## 2011-04-03 NOTE — Progress Notes (Signed)
Spoke with Dr. Jolayne Panther to notify of pt in ED w/ flu like symptoms, lab and CT results reported.  Pt OB history provided to MD and monitor tracing results.  Will continue to monitor and give IV bolus for contractions.  Everlean Patterson, RNC

## 2011-04-04 ENCOUNTER — Encounter (HOSPITAL_COMMUNITY): Payer: Self-pay | Admitting: *Deleted

## 2011-04-04 MED ORDER — OXYCODONE-ACETAMINOPHEN 5-325 MG PO TABS
2.0000 | ORAL_TABLET | Freq: Once | ORAL | Status: DC
  Filled 2011-04-04: qty 2

## 2011-04-04 MED ORDER — OXYCODONE-ACETAMINOPHEN 5-325 MG PO TABS
1.0000 | ORAL_TABLET | Freq: Once | ORAL | Status: AC
  Administered 2011-04-04: 1 via ORAL

## 2011-04-04 MED ORDER — OXYCODONE-ACETAMINOPHEN 5-325 MG PO TABS: 1.0000 | ORAL_TABLET | ORAL | Status: AC | PRN

## 2011-04-04 MED ORDER — OSELTAMIVIR PHOSPHATE 75 MG PO CAPS: 75.0000 mg | ORAL_CAPSULE | Freq: Two times a day (BID) | ORAL | Status: AC

## 2011-04-04 NOTE — Progress Notes (Signed)
Drenda Freeze CNM notified of pt's admission and status.

## 2011-04-04 NOTE — ED Provider Notes (Signed)
Agree with above note.  Grace [name] 04/04/2011 7:57 AM

## 2011-04-04 NOTE — ED Notes (Signed)
Pt unable to get Mother for transportation to [facility] to get key from boyfriend before going home. Pt cannot go back to unit to see son until better from flu. AC called and taxi voucher given.

## 2011-04-04 NOTE — ED Notes (Signed)
Drenda Freeze Cres-Dishmon CNM in to see pt

## 2011-04-04 NOTE — ED Provider Notes (Signed)
Chief Complaint:  Abdominal Cramping   Grace [name] is  25 y.o. G3P1011 at [redacted]w[redacted]d presents complaining of Abdominal Cramping .  She states ? contractions associated with none vaginal bleeding, intact membranes, along with active fetal movement. She has been at [facility] and dx with probably flu, but sent here for evaluation of ? contractions  Obstetrical/Gynecological History: Menstrual History: OB History    Grav Para Term Preterm Abortions TAB SAB Ect Mult Living   3 1 1  1  1   1        Patient's last menstrual period was 08/21/2010.     Past Medical History: Past Medical History  Diagnosis Date  . Sickle cell trait   . Headache   . Hyperthyroidism   . Sickle cell trait     Past Surgical History: Past Surgical History  Procedure Date  . Dilation and curettage of uterus     Family History: Family History  Problem Relation Age of Onset  . Diabetes Mother   . Cancer Father   . Diabetes Maternal Uncle   . Cancer Maternal Grandmother   . Cancer Maternal Grandfather   . Hypertension Maternal Grandfather   . Diabetes Maternal Grandfather   . Sickle cell anemia Son     Social History: History  Substance Use Topics  . Smoking status: Never Smoker   . Smokeless tobacco: Not on file  . Alcohol Use: No    Allergies: No Known Allergies  Meds:  (Not in a hospital admission)  Review of Systems - Feels "achy all over" with chills, cough, and sore throat.  C/O feeling "crampy" in abdomen.   Physical Exam  Blood pressure 110/53, pulse 138, temperature 99.2 F (37.3 C), temperature source Oral, resp. rate 32, last menstrual period 08/21/2010, SpO2 96.00%. GENERAL: Well-developed, well-nourished female in no acute distress.  Appears to feel bad.  LUNGS: Clear to auscultation bilaterally. + productive cough HEART: Regular rate and rhythm. ABDOMEN: Soft, nontender, nondistended, gravid.  EXTREMITIES: Nontender, no edema, 2+ distal pulses. Cervical Exam: Dilatation  0cm   Effacement 0%   Station -2   Presentation: cephalic FHT:  Baseline rate 150 bpm   Variability moderate  Accelerations present   Decelerations none Contractions: Every 0 mins, occ uterine irritability   Labs: Recent Results (from the past 24 hour(s))  URINALYSIS, ROUTINE W REFLEX MICROSCOPIC   Collection Time   04/03/11  6:02 PM      Component Value Range   Color, Urine YELLOW  YELLOW    APPearance CLEAR  CLEAR    Specific Gravity, Urine 1.010  1.005 - 1.030    pH 7.0  5.0 - 8.0    Glucose, UA NEGATIVE  NEGATIVE (mg/dL)   Hgb urine dipstick NEGATIVE  NEGATIVE    Bilirubin Urine NEGATIVE  NEGATIVE    Ketones, ur 40 (*) NEGATIVE (mg/dL)   Protein, ur NEGATIVE  NEGATIVE (mg/dL)   Urobilinogen, UA 1.0  0.0 - 1.0 (mg/dL)   Nitrite NEGATIVE  NEGATIVE    Leukocytes, UA NEGATIVE  NEGATIVE   CBC   Collection Time   04/03/11  6:34 PM      Component Value Range   WBC 12.0 (*) 4.0 - 10.5 (K/uL)   RBC 3.44 (*) 3.87 - 5.11 (MIL/uL)   Hemoglobin 10.1 (*) 12.0 - 15.0 (g/dL)   HCT 16.1 (*) 09.6 - 46.0 (%)   MCV 86.0  78.0 - 100.0 (fL)   MCH 29.4  26.0 - 34.0 (pg)   MCHC  34.1  30.0 - 36.0 (g/dL)   RDW 08.6  57.8 - 46.9 (%)   Platelets 291  150 - 400 (K/uL)  DIFFERENTIAL   Collection Time   04/03/11  6:34 PM      Component Value Range   Neutrophils Relative 87 (*) 43 - 77 (%)   Neutro Abs 10.4 (*) 1.7 - 7.7 (K/uL)   Lymphocytes Relative 6 (*) 12 - 46 (%)   Lymphs Abs 0.7  0.7 - 4.0 (K/uL)   Monocytes Relative 8  3 - 12 (%)   Monocytes Absolute 0.9  0.1 - 1.0 (K/uL)   Eosinophils Relative 0  0 - 5 (%)   Eosinophils Absolute 0.0  0.0 - 0.7 (K/uL)   Basophils Relative 0  0 - 1 (%)   Basophils Absolute 0.0  0.0 - 0.1 (K/uL)  BASIC METABOLIC PANEL   Collection Time   04/03/11  6:34 PM      Component Value Range   Sodium 133 (*) 135 - 145 (mEq/L)   Potassium 3.8  3.5 - 5.1 (mEq/L)   Chloride 101  96 - 112 (mEq/L)   CO2 19  19 - 32 (mEq/L)   Glucose, Bld 73  70 - 99 (mg/dL)    BUN 3 (*) 6 - 23 (mg/dL)   Creatinine, Ser 6.29  0.50 - 1.10 (mg/dL)   Calcium 8.8  8.4 - 52.8 (mg/dL)   GFR calc non Af Amer >90  >90 (mL/min)   GFR calc Af Amer >90  >90 (mL/min)   Imaging Studies:  Ct Angio Chest W/cm &/or Wo Cm  04/03/2011  *RADIOLOGY REPORT*  Clinical Data:  Pregnant, shortness of breath, chest pain.  CT ANGIOGRAPHY CHEST WITH CONTRAST  Technique:  Multidetector CT imaging of the chest was performed using the standard protocol during bolus administration of intravenous contrast.  Multiplanar CT image reconstructions including MIPs were obtained to evaluate the vascular anatomy.  Contrast: 80mL OMNIPAQUE IOHEXOL 350 MG/ML IV SOLN  Comparison:  12/31/2004 radiograph  Findings:  There are no main, lobar, or segmental branch filling defects.  Subsegmental branches are suboptimally evaluated due to contrast bolus timing.  The heart size is within normal limits. There is trace pericardial fluid versus thickening.  The esophagus is distended with fluid and mildly dilated distally.  No intrathoracic lymphadenopathy.  Normal caliber aorta and great vessels.  Central airways are patent.  Lungs are clear.  No pneumothorax.  Limited images through the upper abdomen show no acute abnormality.  No acute osseous abnormality.  Review of the MIP images confirms the above findings.  IMPRESSION: No pulmonary embolism identified. Trace pericardial fluid versus thickening. Otherwise, no acute cardiopulmonary process.  Fluid distended esophagus may represent gastroesophageal reflux disease.  Original Report Authenticated By: Waneta Martins, M.D.    Assessment: Grace [name] is  25 y.o. G3P1011 at [redacted]w[redacted]d presents with probably flu.  Plan: Discussed with Dr. Jolayne Panther.  Will treat out pt for flu  CRESENZO-DISHMAN,Claira Jeter 12/16/20121:25 AM

## 2011-04-04 NOTE — ED Provider Notes (Signed)
I saw and evaluated the patient, reviewed the resident's note and I agree with the findings and plan.  [redacted] weeks pregnant, lower abdominal cramping with flu like symptoms, exposure to flu.  Tachycardia with CP, SOB.  No vaginal bleeding. CTPE neg.  D/with OB and will be transferred to [facility] for [facility]  Glynn Octave, MD 04/04/11 [phone]

## 2011-04-04 NOTE — Progress Notes (Signed)
Pt admitted Rm #[phone] via [facility] from [facility] Ed. 32wks. Pregnant. Having fever, chills, pain lower abd and back since Sat. 1yr old son pt at [facility] with ?flu/pneumonia

## 2011-04-05 ENCOUNTER — Ambulatory Visit (HOSPITAL_COMMUNITY): Payer: Medicaid Other

## 2011-04-19 ENCOUNTER — Ambulatory Visit (INDEPENDENT_AMBULATORY_CARE_PROVIDER_SITE_OTHER): Payer: Medicaid Other | Admitting: Advanced Practice Midwife

## 2011-04-19 ENCOUNTER — Ambulatory Visit (HOSPITAL_COMMUNITY)
Admission: RE | Admit: 2011-04-19 | Discharge: 2011-04-19 | Disposition: A | Discharge status: Home / Self Care | Payer: Medicaid Other | Source: Ambulatory Visit | Attending: Advanced Practice Midwife | Admitting: Advanced Practice Midwife

## 2011-04-19 DIAGNOSIS — E0590 Thyrotoxicosis, unspecified without thyrotoxic crisis or storm: Secondary | ICD-10-CM

## 2011-04-19 DIAGNOSIS — G4700 Insomnia, unspecified: Secondary | ICD-10-CM

## 2011-04-19 DIAGNOSIS — Z3689 Encounter for other specified antenatal screening: Secondary | ICD-10-CM | POA: Insufficient documentation

## 2011-04-19 DIAGNOSIS — O2390 Unspecified genitourinary tract infection in pregnancy, unspecified trimester: Secondary | ICD-10-CM

## 2011-04-19 DIAGNOSIS — O0990 Supervision of high risk pregnancy, unspecified, unspecified trimester: Secondary | ICD-10-CM

## 2011-04-19 DIAGNOSIS — E079 Disorder of thyroid, unspecified: Secondary | ICD-10-CM | POA: Insufficient documentation

## 2011-04-19 DIAGNOSIS — O2690 Pregnancy related conditions, unspecified, unspecified trimester: Secondary | ICD-10-CM

## 2011-04-19 DIAGNOSIS — O99280 Endocrine, nutritional and metabolic diseases complicating pregnancy, unspecified trimester: Secondary | ICD-10-CM | POA: Insufficient documentation

## 2011-04-19 LAB — POCT URINALYSIS DIP (DEVICE)
Bilirubin Urine: NEGATIVE
Glucose, UA: NEGATIVE mg/dL
Hgb urine dipstick: NEGATIVE
Ketones, ur: NEGATIVE mg/dL
Leukocytes, UA: NEGATIVE
Nitrite: NEGATIVE
Protein, ur: NEGATIVE mg/dL
Specific Gravity, Urine: 1.015 (ref 1.005–1.030)
Urobilinogen, UA: 0.2 mg/dL (ref 0.0–1.0)
pH: 5.5 (ref 5.0–8.0)

## 2011-04-19 MED ORDER — ZOLPIDEM TARTRATE 10 MG PO TABS: 10.0000 mg | ORAL_TABLET | Freq: Every evening | ORAL | Status: AC | PRN

## 2011-04-19 NOTE — Progress Notes (Signed)
Had the flu 2 weeks ago, treated with Tamiflu. Not sleeping at all recently. Has this problem with pregnancy. OTC remedies not working. Will try Ambien. US done today>Growth 45%, AFI 9.69 (17%), Post. Placenta.  Will consider f/u growth [place] at 38 wks. NSTs 2x/wk and delivery at 39wks.  Goes to [facility] >> called them and they will fax [place] her recent lab results Subjective:    Grace [name] is a 25 y.o. G3P1011 [redacted]w[redacted]d being seen today for her obstetrical visit.  Patient reports no complaints. Fetal movement: normal.  Objective:    BP 116/71  Temp 97.2 F (36.2 C)  Wt 153 lb 1.6 oz (69.446 kg)  LMP 08/21/2010  Physical Exam  Exam  FHT:  160 BPM  Uterine Size: size equals dates  Presentation: cephalic     Assessment:    Pregnancy:  G3P1011    Plan:    Patient Active Problem List  Diagnoses  . Sickle cell trait  . Pyelonephritis complicating pregnancy  . Hyperthyroidism  . History of molar pregnancy, antepartum  . Supervision of high-risk pregnancy    Pediatrician: discussed. Infant feeding: plans to breastfeed. Follow up in 1 Week.

## 2011-04-19 NOTE — Patient Instructions (Signed)
Pregnancy - Third Trimester The third trimester of pregnancy (the last 3 months) is a period of the most rapid growth for you and your baby. The baby approaches a length of 20 inches and a weight of 6 to 10 pounds. The baby is adding on fat and getting ready for life outside your body. While inside, babies have periods of sleeping and waking, suck their thumbs, and hiccups. You can often feel small contractions of the uterus. This is false labor. It is also called Braxton-Hicks contractions. This is like a practice for labor. The usual problems in this stage of pregnancy include more difficulty breathing, swelling of the hands and feet from water retention, and having to urinate more often because of the uterus and baby pressing on your bladder.  PRENATAL EXAMS  Blood work may continue to be done during prenatal exams. These tests are done to check on your health and the probable health of your baby. Blood work is used to follow your blood levels (hemoglobin). Anemia (low hemoglobin) is common during pregnancy. Iron and vitamins are given to help prevent this. You may also continue to be checked for diabetes. Some of the past blood tests may be done again.   The size of the uterus is measured during each visit. This makes sure your baby is growing properly according to your pregnancy dates.   Your blood pressure is checked every prenatal visit. This is to make sure you are not getting toxemia.   Your urine is checked every prenatal visit for infection, diabetes and protein.   Your weight is checked at each visit. This is done to make sure gains are happening at the suggested rate and that you and your baby are growing normally.   Sometimes, an ultrasound is performed to confirm the position and the proper growth and development of the baby. This is a test done that bounces harmless sound waves off the baby so your caregiver can more accurately determine due dates.   Discuss the type of pain  medication and anesthesia you will have during your labor and delivery.   Discuss the possibility and anesthesia if a Cesarean Section might be necessary.   Inform your caregiver if there is any mental or physical violence at home.  Sometimes, a specialized non-stress test, contraction stress test and biophysical profile are done to make sure the baby is not having a problem. Checking the amniotic fluid surrounding the baby is called an amniocentesis. The amniotic fluid is removed by sticking a needle into the belly (abdomen). This is sometimes done near the end of pregnancy if an early delivery is required. In this case, it is done to help make sure the baby's lungs are mature enough for the baby to live outside of the womb. If the lungs are not mature and it is unsafe to deliver the baby, an injection of cortisone medication is given to the mother 1 to 2 days before the delivery. This helps the baby's lungs mature and makes it safer to deliver the baby. CHANGES OCCURING IN THE THIRD TRIMESTER OF PREGNANCY Your body goes through many changes during pregnancy. They vary from person to person. Talk to your caregiver about changes you notice and are concerned about.  During the last trimester, you have probably had an increase in your appetite. It is normal to have cravings for certain foods. This varies from person to person and pregnancy to pregnancy.   You may begin to get stretch marks on your hips,   abdomen, and breasts. These are normal changes in the body during pregnancy. There are no exercises or medications to take which prevent this change.   Constipation may be treated with a stool softener or adding bulk to your diet. Drinking lots of fluids, fiber in vegetables, fruits, and whole grains are helpful.   Exercising is also helpful. If you have been very active up until your pregnancy, most of these activities can be continued during your pregnancy. If you have been less active, it is helpful  to start an exercise program such as walking. Consult your caregiver before starting exercise programs.   Avoid all smoking, alcohol, un-prescribed drugs, herbs and "street drugs" during your pregnancy. These chemicals affect the formation and growth of the baby. Avoid chemicals throughout the pregnancy to ensure the delivery of a healthy infant.   Backache, varicose veins and hemorrhoids may develop or get worse.   You will tire more easily in the third trimester, which is normal.   The baby's movements may be stronger and more often.   You may become short of breath easily.   Your belly button may stick out.   A yellow discharge may leak from your breasts called colostrum.   You may have a bloody mucus discharge. This usually occurs a few days to a week before labor begins.  HOME CARE INSTRUCTIONS   Keep your caregiver's appointments. Follow your caregiver's instructions regarding medication use, exercise, and diet.   During pregnancy, you are providing food for you and your baby. Continue to eat regular, well-balanced meals. Choose foods such as meat, fish, milk and other low fat dairy products, vegetables, fruits, and whole-grain breads and cereals. Your caregiver will tell you of the ideal weight gain.   A physical sexual relationship may be continued throughout pregnancy if there are no other problems such as early (premature) leaking of amniotic fluid from the membranes, vaginal bleeding, or belly (abdominal) pain.   Exercise regularly if there are no restrictions. Check with your caregiver if you are unsure of the safety of your exercises. Greater weight gain will occur in the last 2 trimesters of pregnancy. Exercising helps:   Control your weight.   Get you in shape for labor and delivery.   You lose weight after you deliver.   Rest a lot with legs elevated, or as needed for leg cramps or low back pain.   Wear a good support or jogging bra for breast tenderness during  pregnancy. This may help if worn during sleep. Pads or tissues may be used in the bra if you are leaking colostrum.   Do not use hot tubs, steam rooms, or saunas.   Wear your seat belt when driving. This protects you and your baby if you are in an accident.   Avoid raw meat, cat litter boxes and soil used by cats. These carry germs that can cause birth defects in the baby.   It is easier to loose urine during pregnancy. Tightening up and strengthening the pelvic muscles will help with this problem. You can practice stopping your urination while you are going to the bathroom. These are the same muscles you need to strengthen. It is also the muscles you would use if you were trying to stop from passing gas. You can practice tightening these muscles up 10 times a set and repeating this about 3 times per day. Once you know what muscles to tighten up, do not perform these exercises during urination. It is more likely   to cause an infection by backing up the urine.   Ask for help if you have financial, counseling or nutritional needs during pregnancy. Your caregiver will be able to offer counseling for these needs as well as refer you for other special needs.   Make a list of emergency phone numbers and have them available.   Plan on getting help from family or friends when you go home from the hospital.   Make a trial run to the hospital.   Take prenatal classes with the father to understand, practice and ask questions about the labor and delivery.   Prepare the baby's room/nursery.   Do not travel out of the city unless it is absolutely necessary and with the advice of your caregiver.   Wear only low or no heal shoes to have better balance and prevent falling.  MEDICATIONS AND DRUG USE IN PREGNANCY  Take prenatal vitamins as directed. The vitamin should contain 1 milligram of folic acid. Keep all vitamins out of reach of children. Only a couple vitamins or tablets containing iron may be fatal  to a baby or young child when ingested.   Avoid use of all medications, including herbs, over-the-counter medications, not prescribed or suggested by your caregiver. Only take over-the-counter or prescription medicines for pain, discomfort, or fever as directed by your caregiver. Do not use aspirin, ibuprofen (Motrin, Advil, Nuprin) or naproxen (Aleve) unless OK'd by your caregiver.   Let your caregiver also know about herbs you may be using.   Alcohol is related to a number of birth defects. This includes fetal alcohol syndrome. All alcohol, in any form, should be avoided completely. Smoking will cause low birth rate and premature babies.   Street/illegal drugs are very harmful to the baby. They are absolutely forbidden. A baby born to an addicted mother will be addicted at birth. The baby will go through the same withdrawal an adult does.  SEEK MEDICAL CARE IF: You have any concerns or worries during your pregnancy. It is better to call with your questions if you feel they cannot wait, rather than worry about them. DECISIONS ABOUT CIRCUMCISION You may or may not know the sex of your baby. If you know your baby is a boy, it may be time to think about circumcision. Circumcision is the removal of the foreskin of the penis. This is the skin that covers the sensitive end of the penis. There is no proven medical need for this. Often this decision is made on what is popular at the time or based upon religious beliefs and social issues. You can discuss these issues with your caregiver or pediatrician. SEEK IMMEDIATE MEDICAL CARE IF:   An unexplained oral temperature above 102 F (38.9 C) develops, or as your caregiver suggests.   You have leaking of fluid from the vagina (birth canal). If leaking membranes are suspected, take your temperature and tell your caregiver of this when you call.   There is vaginal spotting, bleeding or passing clots. Tell your caregiver of the amount and how many pads are  used.   You develop a bad smelling vaginal discharge with a change in the color from clear to white.   You develop vomiting that lasts more than 24 hours.   You develop chills or fever.   You develop shortness of breath.   You develop burning on urination.   You loose more than 2 pounds of weight or gain more than 2 pounds of weight or as suggested by your   caregiver.   You notice sudden swelling of your face, hands, and feet or legs.   You develop belly (abdominal) pain. Round ligament discomfort is a common non-cancerous (benign) cause of abdominal pain in pregnancy. Your caregiver still must evaluate you.   You develop a severe headache that does not go away.   You develop visual problems, blurred or double vision.   If you have not felt your baby move for more than 1 hour. If you think the baby is not moving as much as usual, eat something with sugar in it and lie down on your left side for an hour. The baby should move at least 4 to 5 times per hour. Call right away if your baby moves less than that.   You fall, are in a car accident or any kind of trauma.   There is mental or physical violence at home.  Document Released: 03/30/2001 Document Revised: 12/16/2010 Document Reviewed: 10/02/2008 Exit[employer] Patient Information 2012 [employer]. 

## 2011-04-19 NOTE — Assessment & Plan Note (Signed)
Growth Korea at 34 wks 45%, 3 hr GTT normal

## 2011-04-20 ENCOUNTER — Inpatient Hospital Stay (HOSPITAL_COMMUNITY)
Admission: AD | Admit: 2011-04-20 | Discharge: 2011-04-20 | Disposition: A | Discharge status: Home / Self Care | Payer: Medicaid Other | Source: Ambulatory Visit | Attending: Obstetrics & Gynecology | Admitting: Obstetrics & Gynecology

## 2011-04-20 ENCOUNTER — Encounter (HOSPITAL_COMMUNITY): Payer: Self-pay | Admitting: *Deleted

## 2011-04-20 DIAGNOSIS — O2300 Infections of kidney in pregnancy, unspecified trimester: Secondary | ICD-10-CM

## 2011-04-20 DIAGNOSIS — O2390 Unspecified genitourinary tract infection in pregnancy, unspecified trimester: Secondary | ICD-10-CM | POA: Insufficient documentation

## 2011-04-20 DIAGNOSIS — B9689 Other specified bacterial agents as the cause of diseases classified elsewhere: Secondary | ICD-10-CM | POA: Insufficient documentation

## 2011-04-20 DIAGNOSIS — D573 Sickle-cell trait: Secondary | ICD-10-CM

## 2011-04-20 DIAGNOSIS — A499 Bacterial infection, unspecified: Secondary | ICD-10-CM | POA: Insufficient documentation

## 2011-04-20 DIAGNOSIS — O99891 Other specified diseases and conditions complicating pregnancy: Secondary | ICD-10-CM | POA: Insufficient documentation

## 2011-04-20 DIAGNOSIS — N12 Tubulo-interstitial nephritis, not specified as acute or chronic: Secondary | ICD-10-CM

## 2011-04-20 DIAGNOSIS — N760 Acute vaginitis: Secondary | ICD-10-CM | POA: Insufficient documentation

## 2011-04-20 LAB — WET PREP, GENITAL: Trich, Wet Prep: NONE SEEN

## 2011-04-20 MED ORDER — METRONIDAZOLE 500 MG PO TABS: 500.0000 mg | ORAL_TABLET | Freq: Two times a day (BID) | ORAL | Status: DC

## 2011-04-20 NOTE — ED Provider Notes (Signed)
Grace [name] is a 26 y.o. year old G49P1011 female at [redacted]w[redacted]d weeks gestation who presents to [facility] reporting leaking moderate amount of fluid after coughing. No leaking to follow. Reports pos FM, mild UC's and denies VB.  History OB History    Grav Para Term Preterm Abortions TAB SAB Ect Mult Living   3 1 1  1  1   1      Past Medical History  Diagnosis Date  . Sickle cell trait   . Headache   . Hyperthyroidism   . Sickle cell trait   . Chronic kidney disease    Past Surgical History  Procedure Date  . Dilation and curettage of uterus    Family History: family history includes Asthma in her son; Cancer in her father, maternal grandfather, and maternal grandmother; Diabetes in her maternal grandfather, maternal uncle, and mother; Heart disease in her father and paternal grandfather; Hypertension in her maternal grandfather; and Sickle cell anemia in her son. Social History:  reports that she has never smoked. She does not have any smokeless tobacco history on file. She reports that she does not drink alcohol or use illicit drugs.  ROS: Otherwise neg  Dilation: 1 Effacement (%): Thick Station: -3 Exam by:: Ivonne Andrew CNM Blood pressure 112/72, pulse 101, temperature 98.1 F (36.7 C), temperature source Oral, resp. rate 16, height 5\' 4"  (1.626 m), weight 70.308 kg (155 lb), last menstrual period 08/21/2010. Maternal Exam:  Uterine Assessment: Contraction strength is mild.  Contraction frequency is irregular.   Abdomen: Patient reports no abdominal tenderness. Fundal height is S=D.   Fetal presentation: vertex  Introitus: Vagina is positive for vaginal discharge (mod amount of thin, white, malodorous discharge.).  Ferning test: negative.   Pelvis: adequate for delivery.   Cervix: Cervix evaluated by sterile speculum exam and digital exam.     Fetal Exam Fetal Monitor Review: Mode: ultrasound.   Baseline rate: 140.  Variability: moderate (6-25 bpm).   Pattern: accelerations  present and no decelerations.    Fetal State Assessment: Category I - tracings are normal.    Neg pooling Physical Exam  Nursing note and vitals reviewed. Constitutional: She is oriented to person, place, and time. She appears well-developed and well-nourished. No distress.  Cardiovascular: Normal rate.   Respiratory: Effort normal.  GI: Soft.  Genitourinary: Uterus is not tender. Cervix exhibits discharge (mucus plug). Cervix exhibits no friability. There is bleeding (scant bloody show) around the vagina. Vaginal discharge (mod amount of thin, white, malodorous discharge.) found.  Neurological: She is alert and oriented to person, place, and time.  Skin: Skin is warm and dry.  Psychiatric: She has a normal mood and affect.    Prenatal labs: ABO, Rh: A/Positive/-- (08/14 0000) Antibody: Negative (08/14 0000) Rubella: Immune (08/14 0000) RPR: Nonreactive (08/14 0000)  HBsAg: Negative (08/14 0000)  HIV: Non-reactive (08/14 0000)  GBS:    Results for orders placed during the hospital encounter of 04/20/11 (from the past 24 hour(s))  WET PREP, GENITAL     Status: Abnormal   Collection Time   04/20/11  1:50 AM      Component Value Range   Yeast, Wet Prep NONE SEEN  NONE SEEN    Trich, Wet Prep NONE SEEN  NONE SEEN    Clue Cells, Wet Prep MODERATE (*) NONE SEEN    WBC, Wet Prep HPF POC MODERATE (*) NONE SEEN   Fern neg  Assessment/Plan: BV Intact membranes  D/C home Rx Flagyl PTL precautions,  FKCs F/U at [facility] as scheduled 04/22/11  Dorathy Kinsman 04/20/2011 2:48 AM     Jahaad Penado 04/20/2011, 1:55 AM

## 2011-04-20 NOTE — Progress Notes (Signed)
Pt had a coughing spell and felt a sudden pool of water between her legs.  Nothing since 0100.

## 2011-04-20 NOTE — L&D Delivery Note (Cosign Needed)
Delivery Note At 1:29 AM a viable and healthy female was delivered via Vaginal, Spontaneous Delivery (Presentation: Right Occiput Anterior).  APGAR: 9, 9; weight 5 lb 3.3 oz (2360 g).   Placenta status: Intact, Spontaneous.  Cord: 3 vessels with the following complications: None.  Cord pH: not indicated  Anesthesia: Epidural  Episiotomy: None Lacerations: None Suture Repair: n/a Est. Blood Loss (mL): 150  Mom to postpartum.  Baby to nursery-stable.   D. Piloto Sherron Flemings Paz. MD PGY-1  05/11/2011, 1:56 AM

## 2011-04-20 NOTE — ED Provider Notes (Signed)
Attestation of Attending Supervision of Advanced Practitioner: Evaluation and management procedures were performed by the PA/NP/CNM/OB Fellow under my supervision/collaboration. Chart reviewed, and agree with management and plan.  Jaynie Collins, M.D. 04/20/2011 4:21 AM

## 2011-04-21 ENCOUNTER — Ambulatory Visit (HOSPITAL_COMMUNITY): Payer: Medicaid Other

## 2011-04-22 ENCOUNTER — Ambulatory Visit (INDEPENDENT_AMBULATORY_CARE_PROVIDER_SITE_OTHER): Payer: Medicaid Other | Admitting: *Deleted

## 2011-04-22 VITALS — BP 102/65

## 2011-04-22 DIAGNOSIS — O99280 Endocrine, nutritional and metabolic diseases complicating pregnancy, unspecified trimester: Secondary | ICD-10-CM

## 2011-04-22 DIAGNOSIS — E0590 Thyrotoxicosis, unspecified without thyrotoxic crisis or storm: Secondary | ICD-10-CM

## 2011-04-22 NOTE — Progress Notes (Signed)
P = 110   NST only today

## 2011-04-26 ENCOUNTER — Ambulatory Visit (INDEPENDENT_AMBULATORY_CARE_PROVIDER_SITE_OTHER): Payer: Medicaid Other | Admitting: Family Medicine

## 2011-04-26 ENCOUNTER — Other Ambulatory Visit: Payer: Self-pay | Admitting: Obstetrics & Gynecology

## 2011-04-26 ENCOUNTER — Encounter: Payer: Self-pay | Admitting: Family Medicine

## 2011-04-26 DIAGNOSIS — E079 Disorder of thyroid, unspecified: Secondary | ICD-10-CM

## 2011-04-26 DIAGNOSIS — O0990 Supervision of high risk pregnancy, unspecified, unspecified trimester: Secondary | ICD-10-CM

## 2011-04-26 LAB — POCT URINALYSIS DIP (DEVICE)
Bilirubin Urine: NEGATIVE
Ketones, ur: NEGATIVE mg/dL
Nitrite: NEGATIVE
pH: 7 (ref 5.0–8.0)

## 2011-04-26 LAB — US OB LIMITED

## 2011-04-26 NOTE — Progress Notes (Signed)
Cultures today---on no treatment for Hyperthyroid--labs stable-last u/s growth ok

## 2011-04-26 NOTE — Progress Notes (Signed)
Pt c/o cramping since yesterday and leaking clear fluid.  Vaginal discharge- clear, blood tinged, no odor

## 2011-04-26 NOTE — Patient Instructions (Signed)
Pregnancy - Third Trimester The third trimester of pregnancy (the last 3 months) is a period of the most rapid growth for you and your baby. The baby approaches a length of 20 inches and a weight of 6 to 10 pounds. The baby is adding on fat and getting ready for life outside your body. While inside, babies have periods of sleeping and waking, suck their thumbs, and hiccups. You can often feel small contractions of the uterus. This is false labor. It is also called Braxton-Hicks contractions. This is like a practice for labor. The usual problems in this stage of pregnancy include more difficulty breathing, swelling of the hands and feet from water retention, and having to urinate more often because of the uterus and baby pressing on your bladder.  PRENATAL EXAMS  Blood work may continue to be done during prenatal exams. These tests are done to check on your health and the probable health of your baby. Blood work is used to follow your blood levels (hemoglobin). Anemia (low hemoglobin) is common during pregnancy. Iron and vitamins are given to help prevent this. You may also continue to be checked for diabetes. Some of the past blood tests may be done again.   The size of the uterus is measured during each visit. This makes sure your baby is growing properly according to your pregnancy dates.   Your blood pressure is checked every prenatal visit. This is to make sure you are not getting toxemia.   Your urine is checked every prenatal visit for infection, diabetes and protein.   Your weight is checked at each visit. This is done to make sure gains are happening at the suggested rate and that you and your baby are growing normally.   Sometimes, an ultrasound is performed to confirm the position and the proper growth and development of the baby. This is a test done that bounces harmless sound waves off the baby so your caregiver can more accurately determine due dates.   Discuss the type of pain  medication and anesthesia you will have during your labor and delivery.   Discuss the possibility and anesthesia if a Cesarean Section might be necessary.   Inform your caregiver if there is any mental or physical violence at home.  Sometimes, a specialized non-stress test, contraction stress test and biophysical profile are done to make sure the baby is not having a problem. Checking the amniotic fluid surrounding the baby is called an amniocentesis. The amniotic fluid is removed by sticking a needle into the belly (abdomen). This is sometimes done near the end of pregnancy if an early delivery is required. In this case, it is done to help make sure the baby's lungs are mature enough for the baby to live outside of the womb. If the lungs are not mature and it is unsafe to deliver the baby, an injection of cortisone medication is given to the mother 1 to 2 days before the delivery. This helps the baby's lungs mature and makes it safer to deliver the baby. CHANGES OCCURING IN THE THIRD TRIMESTER OF PREGNANCY Your body goes through many changes during pregnancy. They vary from person to person. Talk to your caregiver about changes you notice and are concerned about.  During the last trimester, you have probably had an increase in your appetite. It is normal to have cravings for certain foods. This varies from person to person and pregnancy to pregnancy.   You may begin to get stretch marks on your hips,   abdomen, and breasts. These are normal changes in the body during pregnancy. There are no exercises or medications to take which prevent this change.   Constipation may be treated with a stool softener or adding bulk to your diet. Drinking lots of fluids, fiber in vegetables, fruits, and whole grains are helpful.   Exercising is also helpful. If you have been very active up until your pregnancy, most of these activities can be continued during your pregnancy. If you have been less active, it is helpful  to start an exercise program such as walking. Consult your caregiver before starting exercise programs.   Avoid all smoking, alcohol, un-prescribed drugs, herbs and "street drugs" during your pregnancy. These chemicals affect the formation and growth of the baby. Avoid chemicals throughout the pregnancy to ensure the delivery of a healthy infant.   Backache, varicose veins and hemorrhoids may develop or get worse.   You will tire more easily in the third trimester, which is normal.   The baby's movements may be stronger and more often.   You may become short of breath easily.   Your belly button may stick out.   A yellow discharge may leak from your breasts called colostrum.   You may have a bloody mucus discharge. This usually occurs a few days to a week before labor begins.  HOME CARE INSTRUCTIONS   Keep your caregiver's appointments. Follow your caregiver's instructions regarding medication use, exercise, and diet.   During pregnancy, you are providing food for you and your baby. Continue to eat regular, well-balanced meals. Choose foods such as meat, fish, milk and other low fat dairy products, vegetables, fruits, and whole-grain breads and cereals. Your caregiver will tell you of the ideal weight gain.   A physical sexual relationship may be continued throughout pregnancy if there are no other problems such as early (premature) leaking of amniotic fluid from the membranes, vaginal bleeding, or belly (abdominal) pain.   Exercise regularly if there are no restrictions. Check with your caregiver if you are unsure of the safety of your exercises. Greater weight gain will occur in the last 2 trimesters of pregnancy. Exercising helps:   Control your weight.   Get you in shape for labor and delivery.   You lose weight after you deliver.   Rest a lot with legs elevated, or as needed for leg cramps or low back pain.   Wear a good support or jogging bra for breast tenderness during  pregnancy. This may help if worn during sleep. Pads or tissues may be used in the bra if you are leaking colostrum.   Do not use hot tubs, steam rooms, or saunas.   Wear your seat belt when driving. This protects you and your baby if you are in an accident.   Avoid raw meat, cat litter boxes and soil used by cats. These carry germs that can cause birth defects in the baby.   It is easier to loose urine during pregnancy. Tightening up and strengthening the pelvic muscles will help with this problem. You can practice stopping your urination while you are going to the bathroom. These are the same muscles you need to strengthen. It is also the muscles you would use if you were trying to stop from passing gas. You can practice tightening these muscles up 10 times a set and repeating this about 3 times per day. Once you know what muscles to tighten up, do not perform these exercises during urination. It is more likely   to cause an infection by backing up the urine.   Ask for help if you have financial, counseling or nutritional needs during pregnancy. Your caregiver will be able to offer counseling for these needs as well as refer you for other special needs.   Make a list of emergency phone numbers and have them available.   Plan on getting help from family or friends when you go home from the hospital.   Make a trial run to the hospital.   Take prenatal classes with the father to understand, practice and ask questions about the labor and delivery.   Prepare the baby's room/nursery.   Do not travel out of the city unless it is absolutely necessary and with the advice of your caregiver.   Wear only low or no heal shoes to have better balance and prevent falling.  MEDICATIONS AND DRUG USE IN PREGNANCY  Take prenatal vitamins as directed. The vitamin should contain 1 milligram of folic acid. Keep all vitamins out of reach of children. Only a couple vitamins or tablets containing iron may be fatal  to a baby or young child when ingested.   Avoid use of all medications, including herbs, over-the-counter medications, not prescribed or suggested by your caregiver. Only take over-the-counter or prescription medicines for pain, discomfort, or fever as directed by your caregiver. Do not use aspirin, ibuprofen (Motrin, Advil, Nuprin) or naproxen (Aleve) unless OK'd by your caregiver.   Let your caregiver also know about herbs you may be using.   Alcohol is related to a number of birth defects. This includes fetal alcohol syndrome. All alcohol, in any form, should be avoided completely. Smoking will cause low birth rate and premature babies.   Street/illegal drugs are very harmful to the baby. They are absolutely forbidden. A baby born to an addicted mother will be addicted at birth. The baby will go through the same withdrawal an adult does.  SEEK MEDICAL CARE IF: You have any concerns or worries during your pregnancy. It is better to call with your questions if you feel they cannot wait, rather than worry about them. DECISIONS ABOUT CIRCUMCISION You may or may not know the sex of your baby. If you know your baby is a boy, it may be time to think about circumcision. Circumcision is the removal of the foreskin of the penis. This is the skin that covers the sensitive end of the penis. There is no proven medical need for this. Often this decision is made on what is popular at the time or based upon religious beliefs and social issues. You can discuss these issues with your caregiver or pediatrician. SEEK IMMEDIATE MEDICAL CARE IF:   An unexplained oral temperature above 102 F (38.9 C) develops, or as your caregiver suggests.   You have leaking of fluid from the vagina (birth canal). If leaking membranes are suspected, take your temperature and tell your caregiver of this when you call.   There is vaginal spotting, bleeding or passing clots. Tell your caregiver of the amount and how many pads are  used.   You develop a bad smelling vaginal discharge with a change in the color from clear to white.   You develop vomiting that lasts more than 24 hours.   You develop chills or fever.   You develop shortness of breath.   You develop burning on urination.   You loose more than 2 pounds of weight or gain more than 2 pounds of weight or as suggested by your   caregiver.   You notice sudden swelling of your face, hands, and feet or legs.   You develop belly (abdominal) pain. Round ligament discomfort is a common non-cancerous (benign) cause of abdominal pain in pregnancy. Your caregiver still must evaluate you.   You develop a severe headache that does not go away.   You develop visual problems, blurred or double vision.   If you have not felt your baby move for more than 1 hour. If you think the baby is not moving as much as usual, eat something with sugar in it and lie down on your left side for an hour. The baby should move at least 4 to 5 times per hour. Call right away if your baby moves less than that.   You fall, are in a car accident or any kind of trauma.   There is mental or physical violence at home.  Document Released: 03/30/2001 Document Revised: 12/16/2010 Document Reviewed: 10/02/2008 [employer] Patient Information 2012 [employer]. Birth Control Choices Birth control is the use of any practices, methods, or devices to prevent pregnancy from happening in a sexually active woman.  Below are some birth control choices to help avoid pregnancy.  Not having sex (abstinence) is the surest form of birth control. This requires self-control. There is no risk of acquiring a sexually transmitted disease (STD), including acquired immunodeficiency syndrome (AIDS).   Periodic abstinence requires self-control during certain times of the month.   Calendar method, timing your menstrual periods from month to month.   Ovulation method is avoiding sexual intercourse around the time  you produce an egg (ovulate).   Symptotherm method is avoiding sexual intercourse at the time of ovulation, using a thermometer and ovulation symptoms.   Post ovulation method is the timing of sexual intercourse after you ovulated.  These methods do not protect against STDs, including AIDS.  Birth control pills (BCPs) contain estrogen and progesterone hormone. These medicines work by stopping the egg from forming in the ovary (ovulation). Birth control pills are prescribed by a caregiver who will ask you questions about the risks of taking BCPs. Birth control pills do not protect against STDs, including AIDS.   "Minipill" birth control pills have only the progesterone hormone. They are taken every day of each month and must be prescribed by your caregiver. They do not protect against STDs, including AIDS.   Emergency contraception is often call the "morning after" pill. This pill can be taken right after sex or up to five days after sex if you think your birth control failed, you failed to use contraception, or you were forced to have sex. It is most effective the sooner you take the pills after having sexual intercourse. Do not use emergency contraception as your only form of birth control. Emergency contraceptive pills are available without a prescription. Check with your pharmacist.   Condoms are a thin sheath of latex, synthetic material, or lambskin worn over the penis during sexual intercourse. They can have a spermicide in or on them when you buy them. Latex condoms can prevent pregnancy and STDs. "Natural" or lambskin condoms can prevent pregnancy but may not protect against STDs, including AIDS.   Female condoms are a soft, loose-fitting sheath that is put into the vagina before sexual intercourse. They can prevent pregnancy and STDs, including AIDS.   Sponge is a soft, circular piece of polyurethane foam with spermicide in it that is inserted into the vagina after wetting it and before  sexual intercourse. It does   not require a prescription from your caregiver. It does not protect against STDs, including AIDS.   Diaphragm is a soft, latex, dome-shaped barrier that must be fitted by a caregiver. It is inserted into the vagina, along with a spermicidal jelly. After the proper fitting for a diaphragm, always insert the diaphragm before intercourse. The diaphragm should be left in the vagina for 6 to 8 hours after intercourse. Removal and reinsertion with a spermicide is always necessary after any use. It does not protect against STDs, including AIDS.   Progesterone-only injections are given every 3 months to prevent pregnancy. These injections contain synthetic progesterone and no estrogen. This hormone stops the ovaries from releasing eggs. It also causes the cervical mucus to thicken and changes the uterine lining. This makes it harder for sperm to survive in the uterus. It does not protect against STDs, including AIDS.   Birth Control Patch contains hormones similar to those in birth control pills, so effectiveness, risks, and side effects are similar. It must be changed once a week and is prescribed by a caregiver. It is less effective in very overweight women. It does not protect against STDs, including AIDS.   Vaginal Ring contains hormones similar to those in birth control pills. It is left in place for 3 weeks, removed for 1 week, and then a new one is put back into the vagina. It comes with a timer to put in your purse to help you remember when to take it out or put a new one in. A caregiver's examination and prescription is necessary, just like with birth control pills and the patch. It does not protect against STDs, including AIDS.   Estrogen plus progesterone injections are given every 28 to 30 days. They can be given in the upper arm, thigh, or buttocks. It does not protect against STDs, including AIDS.   Intrauterine device (IUD): copper T or progestin filled is a T-shaped  device that is put in a woman's uterus during a menstrual period to prevent pregnancy. The copper T IUD can last 10 years, and the progestin IUD can last 5 years. The progestin IUD can also help control heavy menstrual periods. It does not protect against STDs, including AIDS. The copper T IUD can be used as emergency contraception if inserted within 5 days of having unprotected intercourse.   Cervical cap is a round, soft latex or plastic cup that fits over the cervix and must be fitted by a caregiver. You do not need to use a spermicide with it or remove and insert it every time you have sexual intercourse. It does not protect against STDs, including AIDS.   Spermicides are chemicals that kill or block sperm from entering the cervix and uterus. They come in the form of creams, jellies, suppositories, foam, or tablets, and they do not require a prescription. They are inserted into the vagina with an applicator before having sexual intercourse. This must be repeated every time you have sexual intercourse.   Withdrawal is using the method of the female withdrawing his penis from sexual intercourse before he has a climax and deposits his sperm. It does not protect against STDs, including AIDS.   Female tubal ligation is when the woman's fallopian tubes are surgically sealed or tied to prevent the egg from traveling to the uterus. It does not protect against STDs, including AIDS.   Female sterilization is when the female has his tubes that carry sperm tied off (vasectomy) to stop sperm from entering the   vagina during sexual intercourse. It does not protect against STDs, including AIDS.  Regardless of which method of birth control you choose, it is still important that you use some form of protection against STDs. Document Released: 04/05/2005 Document Revised: 05/08/2010 Document Reviewed: 02/20/2009 [employer] Patient Information 2012 [employer]. Breastfeeding BENEFITS OF BREASTFEEDING For the baby  The  first milk (colostrum) helps the baby's digestive system function better.   There are antibodies from the mother in the milk that help the baby fight off infections.   The baby has a lower incidence of asthma, allergies, and SIDS (sudden infant death syndrome).   The nutrients in breast milk are better than formulas for the baby and helps the baby's brain grow better.   Babies who breastfeed have less gas, colic, and constipation.  For the mother  Breastfeeding helps develop a very special bond between mother and baby.   It is more convenient, always available at the correct temperature and cheaper than formula feeding.   It burns calories in the mother and helps with losing weight that was gained during pregnancy.   It makes the uterus contract back down to normal size faster and slows bleeding following delivery.   Breastfeeding mothers have a lower risk of developing breast cancer.  NURSE FREQUENTLY  A healthy, full-term baby may breastfeed as often as every hour or space his or her feedings to every 3 hours.   How often to nurse will vary from baby to baby. Watch your baby for signs of hunger, not the clock.   Nurse as often as the baby requests, or when you feel the need to reduce the fullness of your breasts.   Awaken the baby if it has been 3 to 4 hours since the last feeding.   Frequent feeding will help the mother make more milk and will prevent problems like sore nipples and engorgement of the breasts.  BABY'S POSITION AT THE BREAST  Whether lying down or sitting, be sure that the baby's tummy is facing your tummy.   Support the breast with 4 fingers underneath the breast and the thumb above. Make sure your fingers are well away from the nipple and baby's mouth.   Stroke the baby's lips and cheek closest to the breast gently with your finger or nipple.   When the baby's mouth is open wide enough, place all of your nipple and as much of the dark area around the nipple  as possible into your baby's mouth.   Pull the baby in close so the tip of the nose and the baby's cheeks touch the breast during the feeding.  FEEDINGS  The length of each feeding varies from baby to baby and from feeding to feeding.   The baby must suck about 2 to 3 minutes for your milk to get to him or her. This is called a "let down." For this reason, allow the baby to feed on each breast as long as he or she wants. Your baby will end the feeding when he or she has received the right balance of nutrients.   To break the suction, put your finger into the corner of the baby's mouth and slide it between his or her gums before removing your breast from his or her mouth. This will help prevent sore nipples.  REDUCING BREAST ENGORGEMENT  In the first week after your baby is born, you may experience signs of breast engorgement. When breasts are engorged, they feel heavy, warm, full, and may   be tender to the touch. You can reduce engorgement if you:   Nurse frequently, every 2 to 3 hours. Mothers who breastfeed early and often have fewer problems with engorgement.   Place light ice packs on your breasts between feedings. This reduces swelling. Wrap the ice packs in a lightweight towel to protect your skin.   Apply moist hot packs to your breast for 5 to 10 minutes before each feeding. This increases circulation and helps the milk flow.   Gently massage your breast before and during the feeding.   Make sure that the baby empties at least one breast at every feeding before switching sides.   Use a breast pump to empty the breasts if your baby is sleepy or not nursing well. You may also want to pump if you are returning to work or or you feel you are getting engorged.   Avoid bottle feeds, pacifiers or supplemental feedings of water or juice in place of breastfeeding.   Be sure the baby is latched on and positioned properly while breastfeeding.   Prevent fatigue, stress, and anemia.   Wear  a supportive bra, avoiding underwire styles.   Eat a balanced diet with enough fluids.  If you follow these suggestions, your engorgement should improve in 24 to 48 hours. If you are still experiencing difficulty, call your lactation consultant or caregiver. IS MY BABY GETTING ENOUGH MILK? Sometimes, mothers worry about whether their babies are getting enough milk. You can be assured that your baby is getting enough milk if:  The baby is actively sucking and you hear swallowing.   The baby nurses at least 8 to 12 times in a 24 hour time period. Nurse your baby until he or she unlatches or falls asleep at the first breast (at least 10 to 20 minutes), then offer the second side.   The baby is wetting 5 to 6 disposable diapers (6 to 8 cloth diapers) in a 24 hour period by 5 to 6 days of age.   The baby is having at least 2 to 3 stools every 24 hours for the first few months. Breast milk is all the food your baby needs. It is not necessary for your baby to have water or formula. In fact, to help your breasts make more milk, it is best not to give your baby supplemental feedings during the early weeks.   The stool should be soft and yellow.   The baby should gain 4 to 7 ounces per week after he is 4 days old.  TAKE CARE OF YOURSELF Take care of your breasts by:  Bathing or showering daily.   Avoiding the use of soaps on your nipples.   Start feedings on your left breast at one feeding and on your right breast at the next feeding.   You will notice an increase in your milk supply 2 to 5 days after delivery. You may feel some discomfort from engorgement, which makes your breasts very firm and often tender. Engorgement "peaks" out within 24 to 48 hours. In the meantime, apply warm moist towels to your breasts for 5 to 10 minutes before feeding. Gentle massage and expression of some milk before feeding will soften your breasts, making it easier for your baby to latch on. Wear a well fitting nursing  bra and air dry your nipples for 10 to 15 minutes after each feeding.   Only use cotton bra pads.   Only use pure lanolin on your nipples after nursing. You   do not need to wash it off before nursing.  Take care of yourself by:   Eating well-balanced meals and nutritious snacks.   Drinking milk, fruit juice, and water to satisfy your thirst (about 8 glasses a day).   Getting plenty of rest.   Increasing calcium in your diet (1200 mg a day).   Avoiding foods that you notice affect the baby in a bad way.  SEEK MEDICAL CARE IF:   You have any questions or difficulty with breastfeeding.   You need help.   You have a hard, red, sore area on your breast, accompanied by a fever of 100.5 F (38.1 C) or more.   Your baby is too sleepy to eat well or is having trouble sleeping.   Your baby is wetting less than 6 diapers per day, by 5 days of age.   Your baby's skin or white part of his or her eyes is more yellow than it was in the hospital.   You feel depressed.  Document Released: 04/05/2005 Document Revised: 12/16/2010 Document Reviewed: 11/18/2008 [employer] Patient Information 2012 [employer]. 

## 2011-04-27 LAB — GC/CHLAMYDIA PROBE AMP, GENITAL: GC Probe Amp, Genital: NEGATIVE

## 2011-04-27 NOTE — Progress Notes (Signed)
Should probably bring her in to r/o rupture.

## 2011-04-27 NOTE — Progress Notes (Signed)
I have reviewed the category I tracing of 04/22/2011 

## 2011-04-29 ENCOUNTER — Other Ambulatory Visit: Payer: Medicaid Other

## 2011-04-29 LAB — CULTURE, BETA STREP (GROUP B ONLY)

## 2011-05-03 ENCOUNTER — Ambulatory Visit (INDEPENDENT_AMBULATORY_CARE_PROVIDER_SITE_OTHER): Payer: Medicaid Other | Admitting: Obstetrics & Gynecology

## 2011-05-03 ENCOUNTER — Other Ambulatory Visit: Payer: Self-pay | Admitting: Obstetrics & Gynecology

## 2011-05-03 ENCOUNTER — Ambulatory Visit (HOSPITAL_COMMUNITY)
Admission: RE | Admit: 2011-05-03 | Discharge: 2011-05-03 | Disposition: A | Discharge status: Home / Self Care | Payer: Medicaid Other | Source: Ambulatory Visit | Attending: Obstetrics & Gynecology | Admitting: Obstetrics & Gynecology

## 2011-05-03 ENCOUNTER — Telehealth: Payer: Self-pay | Admitting: *Deleted

## 2011-05-03 DIAGNOSIS — O368190 Decreased fetal movements, unspecified trimester, not applicable or unspecified: Secondary | ICD-10-CM | POA: Insufficient documentation

## 2011-05-03 DIAGNOSIS — O288 Other abnormal findings on antenatal screening of mother: Secondary | ICD-10-CM

## 2011-05-03 DIAGNOSIS — O99280 Endocrine, nutritional and metabolic diseases complicating pregnancy, unspecified trimester: Secondary | ICD-10-CM

## 2011-05-03 DIAGNOSIS — E079 Disorder of thyroid, unspecified: Secondary | ICD-10-CM | POA: Insufficient documentation

## 2011-05-03 DIAGNOSIS — Z23 Encounter for immunization: Secondary | ICD-10-CM

## 2011-05-03 DIAGNOSIS — O0990 Supervision of high risk pregnancy, unspecified, unspecified trimester: Secondary | ICD-10-CM

## 2011-05-03 LAB — POCT URINALYSIS DIP (DEVICE)
Glucose, UA: NEGATIVE mg/dL
Hgb urine dipstick: NEGATIVE
Nitrite: NEGATIVE
Protein, ur: NEGATIVE mg/dL
Specific Gravity, Urine: 1.01 (ref 1.005–1.030)
Urobilinogen, UA: 0.2 mg/dL (ref 0.0–1.0)

## 2011-05-03 MED ORDER — TETANUS-DIPHTH-ACELL PERTUSSIS 5-2.5-18.5 LF-MCG/0.5 IM SUSP: 0.5000 mL | Freq: Once | INTRAMUSCULAR | Status: DC

## 2011-05-03 NOTE — Telephone Encounter (Signed)
Pt left message requesting her [place] results from today. I called her back and discussed the results. Pt states that she only felt movement during the [place] when the technician was "doing something" and that the baby did not move spontaneously. I advised pt to do twice daily kick counts and explained the procedure. Pt voiced understanding.

## 2011-05-03 NOTE — Progress Notes (Signed)
NST non reactive and not responding to acoustic stim.  Pt needs BPP and AFI. Also need TSH from [place] drawn in December.  Record release signed today.  If 6/10 BPP then will be admitted.

## 2011-05-03 NOTE — Progress Notes (Signed)
P=111 , C/o decreased fetal movement since 8pm last night, c/o trace edema in feet,c/o pelvic pressure, "and what felt like back contractions early this morning- about 3 every couple of hours", has not gotten tdap yet, desires to get today

## 2011-05-03 NOTE — Progress Notes (Signed)
Addended by: Sherre Lain A on: 05/03/2011 11:18 AM   Modules accepted: Orders

## 2011-05-03 NOTE — Progress Notes (Signed)
Addended by: Marchelle Folks L on: 05/03/2011 11:40 AM   Modules accepted: Orders

## 2011-05-03 NOTE — Progress Notes (Signed)
Addended by: Jill Side on: 05/03/2011 10:59 AM   Modules accepted: Orders

## 2011-05-05 ENCOUNTER — Telehealth: Payer: Self-pay | Admitting: *Deleted

## 2011-05-05 NOTE — Telephone Encounter (Signed)
Called and left second message that I am concerned about her and want to know about the baby's movement. Please call nurse voice mail line with an update.

## 2011-05-05 NOTE — Telephone Encounter (Signed)
Left message for pt that I am checking to see how the baby's movement has been and the results of her kick counts. I requested that she call back and leave message for me on the nurse voice mail of the details of FM and also if she has any questions or concerns. I also stated reminder of her appt tomorrow @ 2 pm.

## 2011-05-06 ENCOUNTER — Other Ambulatory Visit: Payer: Medicaid Other

## 2011-05-10 ENCOUNTER — Encounter (HOSPITAL_COMMUNITY): Payer: Self-pay | Admitting: Anesthesiology

## 2011-05-10 ENCOUNTER — Encounter (HOSPITAL_COMMUNITY): Payer: Self-pay

## 2011-05-10 ENCOUNTER — Ambulatory Visit (INDEPENDENT_AMBULATORY_CARE_PROVIDER_SITE_OTHER): Payer: Medicaid Other | Admitting: Family Medicine

## 2011-05-10 ENCOUNTER — Inpatient Hospital Stay (HOSPITAL_COMMUNITY): Payer: Medicaid Other | Admitting: Anesthesiology

## 2011-05-10 ENCOUNTER — Inpatient Hospital Stay (HOSPITAL_COMMUNITY)
Admission: AD | Admit: 2011-05-10 | Discharge: 2011-05-13 | DRG: 775 | Disposition: A | Discharge status: Home / Self Care | Payer: Medicaid Other | Source: Ambulatory Visit | Attending: Obstetrics & Gynecology | Admitting: Obstetrics & Gynecology

## 2011-05-10 DIAGNOSIS — E079 Disorder of thyroid, unspecified: Secondary | ICD-10-CM

## 2011-05-10 DIAGNOSIS — E0590 Thyrotoxicosis, unspecified without thyrotoxic crisis or storm: Secondary | ICD-10-CM | POA: Diagnosis present

## 2011-05-10 DIAGNOSIS — D573 Sickle-cell trait: Secondary | ICD-10-CM

## 2011-05-10 DIAGNOSIS — O99280 Endocrine, nutritional and metabolic diseases complicating pregnancy, unspecified trimester: Secondary | ICD-10-CM

## 2011-05-10 DIAGNOSIS — O9902 Anemia complicating childbirth: Secondary | ICD-10-CM | POA: Diagnosis present

## 2011-05-10 DIAGNOSIS — O368190 Decreased fetal movements, unspecified trimester, not applicable or unspecified: Secondary | ICD-10-CM

## 2011-05-10 DIAGNOSIS — O2300 Infections of kidney in pregnancy, unspecified trimester: Secondary | ICD-10-CM

## 2011-05-10 DIAGNOSIS — O99284 Endocrine, nutritional and metabolic diseases complicating childbirth: Secondary | ICD-10-CM | POA: Diagnosis present

## 2011-05-10 DIAGNOSIS — O4290 Premature rupture of membranes, unspecified as to length of time between rupture and onset of labor, unspecified weeks of gestation: Principal | ICD-10-CM | POA: Diagnosis present

## 2011-05-10 DIAGNOSIS — O0990 Supervision of high risk pregnancy, unspecified, unspecified trimester: Secondary | ICD-10-CM

## 2011-05-10 LAB — POCT URINALYSIS DIP (DEVICE)
Bilirubin Urine: NEGATIVE
Hgb urine dipstick: NEGATIVE
Ketones, ur: NEGATIVE mg/dL
pH: 5.5 (ref 5.0–8.0)

## 2011-05-10 LAB — CBC
MCH: 30.5 pg (ref 26.0–34.0)
MCHC: 34.8 g/dL (ref 30.0–36.0)
Platelets: 420 10*3/uL — ABNORMAL HIGH (ref 150–400)
RDW: 14.8 % (ref 11.5–15.5)

## 2011-05-10 MED ORDER — FENTANYL 2.5 MCG/ML BUPIVACAINE 1/10 % EPIDURAL INFUSION (WH - ANES)
14.0000 mL/h | INTRAMUSCULAR | Status: DC
  Administered 2011-05-11 (×2): 14 mL/h via EPIDURAL
  Filled 2011-05-10 (×2): qty 60

## 2011-05-10 MED ORDER — EPHEDRINE 5 MG/ML INJ
10.0000 mg | INTRAVENOUS | Status: DC | PRN
  Filled 2011-05-10: qty 4

## 2011-05-10 MED ORDER — OXYCODONE-ACETAMINOPHEN 5-325 MG PO TABS: 2.0000 | ORAL_TABLET | ORAL | Status: DC | PRN

## 2011-05-10 MED ORDER — OXYTOCIN BOLUS FROM INFUSION
500.0000 mL | Freq: Once | INTRAVENOUS | Status: DC
  Filled 2011-05-10: qty 500
  Filled 2011-05-10 (×2): qty 1000

## 2011-05-10 MED ORDER — LIDOCAINE HCL (PF) 1 % IJ SOLN
30.0000 mL | INTRAMUSCULAR | Status: DC | PRN
  Filled 2011-05-10: qty 30

## 2011-05-10 MED ORDER — TERBUTALINE SULFATE 1 MG/ML IJ SOLN: 0.2500 mg | Freq: Once | INTRAMUSCULAR | Status: AC | PRN

## 2011-05-10 MED ORDER — OXYTOCIN 20 UNITS IN LACTATED RINGERS INFUSION - SIMPLE
1.0000 m[IU]/min | INTRAVENOUS | Status: DC
  Administered 2011-05-10: 2 m[IU]/min via INTRAVENOUS

## 2011-05-10 MED ORDER — PHENYLEPHRINE 40 MCG/ML (10ML) SYRINGE FOR IV PUSH (FOR BLOOD PRESSURE SUPPORT): 80.0000 ug | PREFILLED_SYRINGE | INTRAVENOUS | Status: DC | PRN

## 2011-05-10 MED ORDER — LACTATED RINGERS IV SOLN
500.0000 mL | Freq: Once | INTRAVENOUS | Status: AC
  Administered 2011-05-10: 500 mL via INTRAVENOUS

## 2011-05-10 MED ORDER — FENTANYL 2.5 MCG/ML BUPIVACAINE 1/10 % EPIDURAL INFUSION (WH - ANES)
INTRAMUSCULAR | Status: DC | PRN
  Administered 2011-05-10: 12 mL/h via EPIDURAL

## 2011-05-10 MED ORDER — PHENYLEPHRINE 40 MCG/ML (10ML) SYRINGE FOR IV PUSH (FOR BLOOD PRESSURE SUPPORT)
80.0000 ug | PREFILLED_SYRINGE | INTRAVENOUS | Status: DC | PRN
  Filled 2011-05-10: qty 5

## 2011-05-10 MED ORDER — CITRIC ACID-SODIUM CITRATE 334-500 MG/5ML PO SOLN: 30.0000 mL | ORAL | Status: DC | PRN

## 2011-05-10 MED ORDER — LACTATED RINGERS IV SOLN
500.0000 mL | INTRAVENOUS | Status: DC | PRN
  Administered 2011-05-10: 500 mL via INTRAVENOUS

## 2011-05-10 MED ORDER — OXYTOCIN 20 UNITS IN LACTATED RINGERS INFUSION - SIMPLE
125.0000 mL/h | Freq: Once | INTRAVENOUS | Status: AC
  Administered 2011-05-11: 999 mL/h via INTRAVENOUS

## 2011-05-10 MED ORDER — ONDANSETRON HCL 4 MG/2ML IJ SOLN
4.0000 mg | Freq: Four times a day (QID) | INTRAMUSCULAR | Status: DC | PRN
  Administered 2011-05-10: 4 mg via INTRAVENOUS
  Filled 2011-05-10: qty 2

## 2011-05-10 MED ORDER — EPHEDRINE 5 MG/ML INJ: 10.0000 mg | INTRAVENOUS | Status: DC | PRN

## 2011-05-10 MED ORDER — LACTATED RINGERS IV SOLN
INTRAVENOUS | Status: DC
  Administered 2011-05-10: 13:00:00 via INTRAVENOUS
  Administered 2011-05-10: 999 mL/h via INTRAVENOUS

## 2011-05-10 MED ORDER — IBUPROFEN 600 MG PO TABS
600.0000 mg | ORAL_TABLET | Freq: Four times a day (QID) | ORAL | Status: DC | PRN
  Administered 2011-05-11: 600 mg via ORAL
  Filled 2011-05-10: qty 1

## 2011-05-10 MED ORDER — DIPHENHYDRAMINE HCL 50 MG/ML IJ SOLN: 12.5000 mg | INTRAMUSCULAR | Status: DC | PRN

## 2011-05-10 MED ORDER — ACETAMINOPHEN 325 MG PO TABS
650.0000 mg | ORAL_TABLET | ORAL | Status: DC | PRN
  Administered 2011-05-10 (×2): 650 mg via ORAL
  Filled 2011-05-10 (×2): qty 2

## 2011-05-10 MED ORDER — FLEET ENEMA 7-19 GM/118ML RE ENEM: 1.0000 | ENEMA | RECTAL | Status: DC | PRN

## 2011-05-10 MED ORDER — LIDOCAINE HCL 1.5 % IJ SOLN
INTRAMUSCULAR | Status: DC | PRN
  Administered 2011-05-10 (×2): 5 mL via EPIDURAL

## 2011-05-10 NOTE — Progress Notes (Signed)
Trinitee RAECHELLE SARTI is a 26 y.o. G3P1011 at [redacted]w[redacted]d  Subjective: Mild pressure w/ UCs  Objective: BP 107/71  Pulse 91  Temp(Src) 98.2 F (36.8 C) (Oral)  Resp 18  LMP 08/21/2010      FHT:  FHR: 125 bpm, variability: minimal ,  accelerations:  Present,  decelerations:  Absent UC:   regular, every 3-4 minutes SVE:   Dilation: Lip/rim Effacement (%): 100 Station: +1 Exam by:: Everrett Coombe RN  Labs: Lab Results  Component Value Date   WBC 8.3 05/10/2011   HGB 10.3* 05/10/2011   HCT 29.6* 05/10/2011   MCV 87.6 05/10/2011   PLT 420* 05/10/2011    Assessment / Plan: Spontaneous labor, progressing normally FHR now reassuring  Labor: Progressing on Pitocin Preeclampsia:  NA Fetal Wellbeing:  Category II Pain Control:  Epidural I/D:  n/a Anticipated MOD:  NSVD  Rin Gorton 05/10/2011, 11:31 PM

## 2011-05-10 NOTE — Progress Notes (Signed)
[place] LINDAANN GRADILLA is a 26 y.o. G3P1011 at [redacted]w[redacted]d   Subjective: No complaints.   Objective: BP 99/55  Pulse 84  Temp(Src) 98.2 F (36.8 C) (Oral)  Resp 20  LMP 08/21/2010      FHT:  FHR: 125 bpm, variability: minimal ,  accelerations:  Present,  decelerations:  Present in two ocasions. they resolved with positioning and O2 UC:   regular, every 2-3 minutes SVE:   Dilation: Lip/rim Effacement (%): 100 Station: +1 Exam by:: Everrett Coombe RN  Labs: Lab Results  Component Value Date   WBC 8.3 05/10/2011   HGB 10.3* 05/10/2011   HCT 29.6* 05/10/2011   MCV 87.6 05/10/2011   PLT 420* 05/10/2011    Assessment / Plan: Augmentation of labor, progressing well . FHT reviewed with attending. Pitocin decreased in half. Pt and baby tolerating well.  Labor: Progressing on Pitocin, will continue to increase then AROM Fetal Wellbeing:  Category II Pain Control:  Epidural I/D:  n/a Anticipated MOD:  NSVD   D. Piloto [employer]. MD PGY-1  05/10/2011, 11:49 PM

## 2011-05-10 NOTE — Patient Instructions (Signed)
Birth Control Choices Birth control is the use of any practices, methods, or devices to prevent pregnancy from happening in a sexually active woman.  Below are some birth control choices to help avoid pregnancy.  Not having sex (abstinence) is the surest form of birth control. This requires self-control. There is no risk of acquiring a sexually transmitted disease (STD), including acquired immunodeficiency syndrome (AIDS).   Periodic abstinence requires self-control during certain times of the month.   Calendar method, timing your menstrual periods from month to month.   Ovulation method is avoiding sexual intercourse around the time you produce an egg (ovulate).   Symptotherm method is avoiding sexual intercourse at the time of ovulation, using a thermometer and ovulation symptoms.   Post ovulation method is the timing of sexual intercourse after you ovulated.  These methods do not protect against STDs, including AIDS.  Birth control pills (BCPs) contain estrogen and progesterone hormone. These medicines work by stopping the egg from forming in the ovary (ovulation). Birth control pills are prescribed by a caregiver who will ask you questions about the risks of taking BCPs. Birth control pills do not protect against STDs, including AIDS.   "Minipill" birth control pills have only the progesterone hormone. They are taken every day of each month and must be prescribed by your caregiver. They do not protect against STDs, including AIDS.   Emergency contraception is often call the "morning after" pill. This pill can be taken right after sex or up to five days after sex if you think your birth control failed, you failed to use contraception, or you were forced to have sex. It is most effective the sooner you take the pills after having sexual intercourse. Do not use emergency contraception as your only form of birth control. Emergency contraceptive pills are available without a prescription. Check  with your pharmacist.   Condoms are a thin sheath of latex, synthetic material, or lambskin worn over the penis during sexual intercourse. They can have a spermicide in or on them when you buy them. Latex condoms can prevent pregnancy and STDs. "Natural" or lambskin condoms can prevent pregnancy but may not protect against STDs, including AIDS.   Female condoms are a soft, loose-fitting sheath that is put into the vagina before sexual intercourse. They can prevent pregnancy and STDs, including AIDS.   Sponge is a soft, circular piece of polyurethane foam with spermicide in it that is inserted into the vagina after wetting it and before sexual intercourse. It does not require a prescription from your caregiver. It does not protect against STDs, including AIDS.   Diaphragm is a soft, latex, dome-shaped barrier that must be fitted by a caregiver. It is inserted into the vagina, along with a spermicidal jelly. After the proper fitting for a diaphragm, always insert the diaphragm before intercourse. The diaphragm should be left in the vagina for 6 to 8 hours after intercourse. Removal and reinsertion with a spermicide is always necessary after any use. It does not protect against STDs, including AIDS.   Progesterone-only injections are given every 3 months to prevent pregnancy. These injections contain synthetic progesterone and no estrogen. This hormone stops the ovaries from releasing eggs. It also causes the cervical mucus to thicken and changes the uterine lining. This makes it harder for sperm to survive in the uterus. It does not protect against STDs, including AIDS.   Birth Control Patch contains hormones similar to those in birth control pills, so effectiveness, risks, and side effects   are similar. It must be changed once a week and is prescribed by a caregiver. It is less effective in very overweight women. It does not protect against STDs, including AIDS.   Vaginal Ring contains hormones similar  to those in birth control pills. It is left in place for 3 weeks, removed for 1 week, and then a new one is put back into the vagina. It comes with a timer to put in your purse to help you remember when to take it out or put a new one in. A caregiver's examination and prescription is necessary, just like with birth control pills and the patch. It does not protect against STDs, including AIDS.   Estrogen plus progesterone injections are given every 28 to 30 days. They can be given in the upper arm, thigh, or buttocks. It does not protect against STDs, including AIDS.   Intrauterine device (IUD): copper T or progestin filled is a T-shaped device that is put in a woman's uterus during a menstrual period to prevent pregnancy. The copper T IUD can last 10 years, and the progestin IUD can last 5 years. The progestin IUD can also help control heavy menstrual periods. It does not protect against STDs, including AIDS. The copper T IUD can be used as emergency contraception if inserted within 5 days of having unprotected intercourse.   Cervical cap is a round, soft latex or plastic cup that fits over the cervix and must be fitted by a caregiver. You do not need to use a spermicide with it or remove and insert it every time you have sexual intercourse. It does not protect against STDs, including AIDS.   Spermicides are chemicals that kill or block sperm from entering the cervix and uterus. They come in the form of creams, jellies, suppositories, foam, or tablets, and they do not require a prescription. They are inserted into the vagina with an applicator before having sexual intercourse. This must be repeated every time you have sexual intercourse.   Withdrawal is using the method of the female withdrawing his penis from sexual intercourse before he has a climax and deposits his sperm. It does not protect against STDs, including AIDS.   Female tubal ligation is when the woman's fallopian tubes are surgically sealed  or tied to prevent the egg from traveling to the uterus. It does not protect against STDs, including AIDS.   Female sterilization is when the female has his tubes that carry sperm tied off (vasectomy) to stop sperm from entering the vagina during sexual intercourse. It does not protect against STDs, including AIDS.  Regardless of which method of birth control you choose, it is still important that you use some form of protection against STDs. Document Released: 04/05/2005 Document Revised: 05/08/2010 Document Reviewed: 02/20/2009 [employer] Patient Information 2012 [employer]. Breastfeeding BENEFITS OF BREASTFEEDING For the baby  The first milk (colostrum) helps the baby's digestive system function better.   There are antibodies from the mother in the milk that help the baby fight off infections.   The baby has a lower incidence of asthma, allergies, and SIDS (sudden infant death syndrome).   The nutrients in breast milk are better than formulas for the baby and helps the baby's brain grow better.   Babies who breastfeed have less gas, colic, and constipation.  For the mother  Breastfeeding helps develop a very special bond between mother and baby.   It is more convenient, always available at the correct temperature and cheaper than formula feeding.     It burns calories in the mother and helps with losing weight that was gained during pregnancy.   It makes the uterus contract back down to normal size faster and slows bleeding following delivery.   Breastfeeding mothers have a lower risk of developing breast cancer.  NURSE FREQUENTLY  A healthy, full-term baby may breastfeed as often as every hour or space his or her feedings to every 3 hours.   How often to nurse will vary from baby to baby. Watch your baby for signs of hunger, not the clock.   Nurse as often as the baby requests, or when you feel the need to reduce the fullness of your breasts.   Awaken the baby if it has been 3  to 4 hours since the last feeding.   Frequent feeding will help the mother make more milk and will prevent problems like sore nipples and engorgement of the breasts.  BABY'S POSITION AT THE BREAST  Whether lying down or sitting, be sure that the baby's tummy is facing your tummy.   Support the breast with 4 fingers underneath the breast and the thumb above. Make sure your fingers are well away from the nipple and baby's mouth.   Stroke the baby's lips and cheek closest to the breast gently with your finger or nipple.   When the baby's mouth is open wide enough, place all of your nipple and as much of the dark area around the nipple as possible into your baby's mouth.   Pull the baby in close so the tip of the nose and the baby's cheeks touch the breast during the feeding.  FEEDINGS  The length of each feeding varies from baby to baby and from feeding to feeding.   The baby must suck about 2 to 3 minutes for your milk to get to him or her. This is called a "let down." For this reason, allow the baby to feed on each breast as long as he or she wants. Your baby will end the feeding when he or she has received the right balance of nutrients.   To break the suction, put your finger into the corner of the baby's mouth and slide it between his or her gums before removing your breast from his or her mouth. This will help prevent sore nipples.  REDUCING BREAST ENGORGEMENT  In the first week after your baby is born, you may experience signs of breast engorgement. When breasts are engorged, they feel heavy, warm, full, and may be tender to the touch. You can reduce engorgement if you:   Nurse frequently, every 2 to 3 hours. Mothers who breastfeed early and often have fewer problems with engorgement.   Place light ice packs on your breasts between feedings. This reduces swelling. Wrap the ice packs in a lightweight towel to protect your skin.   Apply moist hot packs to your breast for 5 to 10  minutes before each feeding. This increases circulation and helps the milk flow.   Gently massage your breast before and during the feeding.   Make sure that the baby empties at least one breast at every feeding before switching sides.   Use a breast pump to empty the breasts if your baby is sleepy or not nursing well. You may also want to pump if you are returning to work or or you feel you are getting engorged.   Avoid bottle feeds, pacifiers or supplemental feedings of water or juice in place of breastfeeding.   Be sure   the baby is latched on and positioned properly while breastfeeding.   Prevent fatigue, stress, and anemia.   Wear a supportive bra, avoiding underwire styles.   Eat a balanced diet with enough fluids.  If you follow these suggestions, your engorgement should improve in 24 to 48 hours. If you are still experiencing difficulty, call your lactation consultant or caregiver. IS MY BABY GETTING ENOUGH MILK? Sometimes, mothers worry about whether their babies are getting enough milk. You can be assured that your baby is getting enough milk if:  The baby is actively sucking and you hear swallowing.   The baby nurses at least 8 to 12 times in a 24 hour time period. Nurse your baby until he or she unlatches or falls asleep at the first breast (at least 10 to 20 minutes), then offer the second side.   The baby is wetting 5 to 6 disposable diapers (6 to 8 cloth diapers) in a 24 hour period by 5 to 6 days of age.   The baby is having at least 2 to 3 stools every 24 hours for the first few months. Breast milk is all the food your baby needs. It is not necessary for your baby to have water or formula. In fact, to help your breasts make more milk, it is best not to give your baby supplemental feedings during the early weeks.   The stool should be soft and yellow.   The baby should gain 4 to 7 ounces per week after he is 4 days old.  TAKE CARE OF YOURSELF Take care of your breasts  by:  Bathing or showering daily.   Avoiding the use of soaps on your nipples.   Start feedings on your left breast at one feeding and on your right breast at the next feeding.   You will notice an increase in your milk supply 2 to 5 days after delivery. You may feel some discomfort from engorgement, which makes your breasts very firm and often tender. Engorgement "peaks" out within 24 to 48 hours. In the meantime, apply warm moist towels to your breasts for 5 to 10 minutes before feeding. Gentle massage and expression of some milk before feeding will soften your breasts, making it easier for your baby to latch on. Wear a well fitting nursing bra and air dry your nipples for 10 to 15 minutes after each feeding.   Only use cotton bra pads.   Only use pure lanolin on your nipples after nursing. You do not need to wash it off before nursing.  Take care of yourself by:   Eating well-balanced meals and nutritious snacks.   Drinking milk, fruit juice, and water to satisfy your thirst (about 8 glasses a day).   Getting plenty of rest.   Increasing calcium in your diet (1200 mg a day).   Avoiding foods that you notice affect the baby in a bad way.  SEEK MEDICAL CARE IF:   You have any questions or difficulty with breastfeeding.   You need help.   You have a hard, red, sore area on your breast, accompanied by a fever of 100.5 F (38.1 C) or more.   Your baby is too sleepy to eat well or is having trouble sleeping.   Your baby is wetting less than 6 diapers per day, by 5 days of age.   Your baby's skin or white part of his or her eyes is more yellow than it was in the hospital.   You feel   depressed.  Document Released: 04/05/2005 Document Revised: 12/16/2010 Document Reviewed: 11/18/2008 [employer] Patient Information 2012 [employer]. 

## 2011-05-10 NOTE — Anesthesia Preprocedure Evaluation (Signed)
Anesthesia Evaluation  Patient identified by MRN, date of birth, ID band Patient awake    Reviewed: Allergy & Precautions, H&P , NPO status , Patient's Chart, lab work & pertinent test results  Airway Mallampati: I TM Distance: >3 FB Neck ROM: full    Dental No notable dental hx.    Pulmonary neg pulmonary ROS,    Pulmonary exam normal       Cardiovascular neg cardio ROS + Cardiac Defibrillator     Neuro/Psych Negative Neurological ROS  Negative Psych ROS   GI/Hepatic negative GI ROS, Neg liver ROS,   Endo/Other  Hyperthyroidism   Renal/GU negative Renal ROS  Genitourinary negative   Musculoskeletal negative musculoskeletal ROS (+)   Abdominal Normal abdominal exam  (+)   Peds negative pediatric ROS (+)  Hematology negative hematology ROS (+)   Anesthesia Other Findings   Reproductive/Obstetrics (+) Pregnancy                           Anesthesia Physical Anesthesia Plan  ASA: II  Anesthesia Plan: Epidural   Post-op Pain Management:    Induction:   Airway Management Planned:   Additional Equipment:   Intra-op Plan:   Post-operative Plan:   Informed Consent: I have reviewed the patients History and Physical, chart, labs and discussed the procedure including the risks, benefits and alternatives for the proposed anesthesia with the patient or authorized representative who has indicated his/her understanding and acceptance.     Plan Discussed with:   Anesthesia Plan Comments:         Anesthesia Quick Evaluation

## 2011-05-10 NOTE — Progress Notes (Signed)
Dr. Madolyn Frieze at the bedside and reviewed FHR strip. Orders received to cut pitocin level in half. Will continue to monitor.

## 2011-05-10 NOTE — Progress Notes (Signed)
Grace [name] is a 26 y.o. G3P1011 at [redacted]w[redacted]d by ultrasound admitted for induction of labor due to Spontaneous rupture of BOW.  Subjective:   Objective: BP 110/65  Pulse 91  Temp 98.3 F (36.8 C)  LMP 08/21/2010      FHT:  FHR: 140 bpm, variability: moderate,  accelerations:  Present,  decelerations:  Absent UC:   irregular, every 5-6 minutes SVE:   Dilation: 3.5 Effacement (%): 60 Station: -2 Exam by:: arnold AROM, clear fluid by Dr Debroah Loop  Labs: Lab Results  Component Value Date   WBC 8.3 05/10/2011   HGB 10.3* 05/10/2011   HCT 29.6* 05/10/2011   MCV 87.6 05/10/2011   PLT 420* 05/10/2011    Assessment / Plan: Induction of labor due to PROM,  progressing well on pitocin  Labor: Progressing normally Preeclampsia:  N/A Fetal Wellbeing:  Category I Pain Control:  Labor support without medications I/D:  n/a Anticipated MOD:  NSVD  LEFTWICH-KIRBY, LISA 05/10/2011, 6:20 PM

## 2011-05-10 NOTE — H&P (Signed)
Grace [name] is a 26 y.o. female presenting for SROM  Patient was seen for routine clinic this AM with no concerns. Was noted to have leaking fluid, with positive ferning Directly admitted for SROM and labor induction. Patient Denies any fevers, headache, vision changes.   Patient [facility] Hyper[facility]ro[facility], which was managed conservatively without medication. Pregnancy was otherwise uncomplicated.   . Maternal Medical History:  Reason for admission: Reason for admission: rupture of membranes.  Reason for Admission:   nauseaFetal activity: Perceived fetal activity is normal.   Last perceived fetal movement was within the past hour.    Prenatal Complications - Diabetes: none.    OB History    Grav Para Term Preterm Abortions TAB SAB Ect Mult Living   3 1 1  1  1   1      Past Medical History  Diagnosis Date  . Sickle cell trait   . Headache   . Hyperthyroidism   . Sickle cell trait   . Chronic kidney disease    Past Surgical History  Procedure Date  . Dilation and curettage of uterus    Family History: family history includes Asthma in her son; Cancer in her father, maternal grandfather, and maternal grandmother; Diabetes in her maternal grandfather, maternal uncle, and mother; Heart disease in her father and paternal grandfather; Hypertension in her maternal grandfather; and Sickle cell anemia in her son. Social History:  reports that she has never smoked. She does not have any smokeless tobacco history on file. She reports that she does not drink alcohol or use illicit drugs.  Review of Systems  Constitutional: Negative for fever and chills.  Eyes: Negative for blurred vision and double vision.  Respiratory: Negative for shortness of breath.   Cardiovascular: Negative for chest pain and palpitations.  Gastrointestinal: Negative for heartburn, nausea, vomiting, abdominal pain and diarrhea.  Genitourinary: Negative for dysuria and  urgency.  Skin: Negative for itching and rash.  Neurological: Negative for headaches.  All other systems reviewed and are negative.    Dilation: 3.5 Effacement (%): 60 Station: -2 Exam by:: m wilkins rnc Blood pressure 114/72, pulse 84, temperature 98 F (36.7 C), last menstrual period 08/21/2010. Maternal Exam:  Uterine Assessment: Contraction strength is mild.  Contraction frequency is irregular.   Introitus: Ferning test: positive.  Nitrazine test: not done. Amniotic fluid character: clear.     Physical Exam  Constitutional: She is oriented to person, place, and time. She appears well-developed and well-nourished. No distress.  Eyes: EOM are normal. Pupils are equal, round, and reactive to light.  Neck: Normal range of motion. Neck supple. No thyromegaly present.  Cardiovascular: Normal rate, regular rhythm, normal heart sounds and intact distal pulses.  Exam reveals no gallop and no friction rub.   No murmur heard. Respiratory: Effort normal and breath sounds normal. No respiratory distress. She has no wheezes. She has no rales. She exhibits no tenderness.  GI: Soft. Bowel sounds are normal. She exhibits no distension and no mass. There is no tenderness. There is no rebound and no guarding.  Musculoskeletal: Normal range of motion. She exhibits no edema and no tenderness.  Neurological: She is alert and oriented to person, place, and time. She has normal reflexes. No cranial nerve deficit.  Skin: Skin is warm and dry. No rash noted. She is not diaphoretic. No erythema. No pallor.  Psychiatric: She has a normal mood and affect. Her behavior is normal. Judgment and thought content  normal.    Prenatal labs: ABO, Rh: A/Positive/-- (08/14 0000) Antibody: Negative (08/14 0000) Rubella: Immune (08/14 0000) RPR: Nonreactive (08/14 0000)  HBsAg: Negative (08/14 0000)  HIV: Non-reactive (08/14 0000)  GBS: Negative (01/07 0000)   Assessment/Plan: SROM  - admit to L&D  -  expectant management  - GBS neg  - epidural upon request  Cameron Proud 05/10/2011, 2:17 PM

## 2011-05-10 NOTE — Progress Notes (Signed)
Dr. Madolyn Frieze notified of pt status, FHR, decelerations, UC pattern, pitocin level, and nursing intervetntions. Will continue to monitor.

## 2011-05-10 NOTE — Progress Notes (Signed)
NST reviewed and reactive. C/o LOF, SSE done-neg pool, positive fern---to L and D for admission

## 2011-05-10 NOTE — Progress Notes (Signed)
Pain and pressure in lower abdomen that radiates to lower back. D/c described as watery; this leak occurred this past Sunday.

## 2011-05-10 NOTE — Progress Notes (Signed)
Pt continues to report decr. FM- has been doing kick counts. Also reporting clear fluid leaking since yesterday.  AFI is 6.7 cm and subjectively low today. Pt had AFI of 15.5 on 1/14.

## 2011-05-10 NOTE — Anesthesia Procedure Notes (Signed)
Epidural Patient location during procedure: OB Start time: 05/10/2011 8:05 PM End time: 05/10/2011 8:11 PM Reason for block: procedure for pain  Staffing Anesthesiologist: Sandrea Hughs Performed by: anesthesiologist   Preanesthetic Checklist Completed: patient identified, site marked, surgical consent, pre-op evaluation, timeout performed, IV checked, risks and benefits discussed and monitors and equipment checked  Epidural Patient position: sitting Prep: site prepped and draped and DuraPrep Patient monitoring: continuous pulse ox and blood pressure Approach: midline Injection technique: LOR air  Needle:  Needle type: Tuohy  Needle gauge: 17 G Needle length: 9 cm Needle insertion depth: 5 cm cm Catheter type: closed end flexible Catheter size: 19 Gauge Catheter at skin depth: 10 cm Test dose: negative and 1.5% lidocaine  Assessment Sensory level: T8 Events: blood not aspirated, injection not painful, no injection resistance, negative IV test and no paresthesia

## 2011-05-11 ENCOUNTER — Encounter (HOSPITAL_COMMUNITY): Payer: Self-pay | Admitting: Family Medicine

## 2011-05-11 DIAGNOSIS — E0590 Thyrotoxicosis, unspecified without thyrotoxic crisis or storm: Secondary | ICD-10-CM

## 2011-05-11 DIAGNOSIS — O99284 Endocrine, nutritional and metabolic diseases complicating childbirth: Secondary | ICD-10-CM

## 2011-05-11 DIAGNOSIS — E079 Disorder of thyroid, unspecified: Secondary | ICD-10-CM

## 2011-05-11 DIAGNOSIS — O9902 Anemia complicating childbirth: Secondary | ICD-10-CM

## 2011-05-11 DIAGNOSIS — O4290 Premature rupture of membranes, unspecified as to length of time between rupture and onset of labor, unspecified weeks of gestation: Secondary | ICD-10-CM

## 2011-05-11 LAB — RPR: RPR Ser Ql: NONREACTIVE

## 2011-05-11 MED ORDER — IBUPROFEN 600 MG PO TABS
600.0000 mg | ORAL_TABLET | Freq: Four times a day (QID) | ORAL | Status: DC
  Administered 2011-05-11 – 2011-05-13 (×9): 600 mg via ORAL
  Filled 2011-05-11 (×9): qty 1

## 2011-05-11 MED ORDER — TETANUS-DIPHTH-ACELL PERTUSSIS 5-2.5-18.5 LF-MCG/0.5 IM SUSP: 0.5000 mL | Freq: Once | INTRAMUSCULAR | Status: DC

## 2011-05-11 MED ORDER — BENZOCAINE-MENTHOL 20-0.5 % EX AERO: 1.0000 "application " | INHALATION_SPRAY | CUTANEOUS | Status: DC | PRN

## 2011-05-11 MED ORDER — ZOLPIDEM TARTRATE 5 MG PO TABS: 5.0000 mg | ORAL_TABLET | Freq: Every evening | ORAL | Status: DC | PRN

## 2011-05-11 MED ORDER — ONDANSETRON HCL 4 MG/2ML IJ SOLN: 4.0000 mg | INTRAMUSCULAR | Status: DC | PRN

## 2011-05-11 MED ORDER — DIBUCAINE 1 % RE OINT: 1.0000 "application " | TOPICAL_OINTMENT | RECTAL | Status: DC | PRN

## 2011-05-11 MED ORDER — DIPHENHYDRAMINE HCL 25 MG PO CAPS: 25.0000 mg | ORAL_CAPSULE | Freq: Four times a day (QID) | ORAL | Status: DC | PRN

## 2011-05-11 MED ORDER — OXYCODONE-ACETAMINOPHEN 5-325 MG PO TABS
1.0000 | ORAL_TABLET | ORAL | Status: DC | PRN
  Administered 2011-05-11 – 2011-05-13 (×4): 1 via ORAL
  Filled 2011-05-11 (×4): qty 1

## 2011-05-11 MED ORDER — LANOLIN HYDROUS EX OINT: TOPICAL_OINTMENT | CUTANEOUS | Status: DC | PRN

## 2011-05-11 MED ORDER — PRENATAL MULTIVITAMIN CH
1.0000 | ORAL_TABLET | Freq: Every day | ORAL | Status: DC
  Administered 2011-05-11 – 2011-05-13 (×3): 1 via ORAL
  Filled 2011-05-11 (×3): qty 1

## 2011-05-11 MED ORDER — SENNOSIDES-DOCUSATE SODIUM 8.6-50 MG PO TABS
2.0000 | ORAL_TABLET | Freq: Every day | ORAL | Status: DC
  Administered 2011-05-11 – 2011-05-12 (×2): 2 via ORAL

## 2011-05-11 MED ORDER — FAMOTIDINE 20 MG PO TABS
20.0000 mg | ORAL_TABLET | Freq: Every day | ORAL | Status: DC
  Administered 2011-05-11 – 2011-05-13 (×3): 20 mg via ORAL
  Filled 2011-05-11 (×3): qty 1

## 2011-05-11 MED ORDER — ONDANSETRON HCL 4 MG PO TABS: 4.0000 mg | ORAL_TABLET | ORAL | Status: DC | PRN

## 2011-05-11 MED ORDER — SIMETHICONE 80 MG PO CHEW: 80.0000 mg | CHEWABLE_TABLET | ORAL | Status: DC | PRN

## 2011-05-11 MED ORDER — WITCH HAZEL-GLYCERIN EX PADS: 1.0000 "application " | MEDICATED_PAD | CUTANEOUS | Status: DC | PRN

## 2011-05-11 NOTE — Anesthesia Postprocedure Evaluation (Signed)
  Anesthesia Post-op Note  Patient: Grace [name]  Procedure(s) Performed: * No procedures listed *  Patient Location: Mother/Baby  Anesthesia Type: Epidural  Level of Consciousness: alert  and oriented  Airway and Oxygen Therapy: Patient Spontanous Breathing  Post-op Pain: mild  Post-op Assessment: Patient's Cardiovascular Status Stable and Respiratory Function Stable  Post-op Vital Signs: stable  Complications: No apparent anesthesia complications

## 2011-05-11 NOTE — Progress Notes (Signed)
UR chart review completed.  

## 2011-05-11 NOTE — Progress Notes (Signed)
NSVD of a viable female.  

## 2011-05-11 NOTE — Anesthesia Postprocedure Evaluation (Signed)
Anesthesia Post Note  Patient: Grace [name]  Procedure(s) Performed: * No procedures listed *  Anesthesia type: Epidural  Patient location: Mother/Baby  Post pain: Pain level controlled  Post assessment: Post-op Vital signs reviewed  Last Vitals:  Filed Vitals:   05/11/11 0231  BP: 120/74  Pulse: 100  Temp:   Resp: 18    Post vital signs: Reviewed  Level of consciousness: awake  Complications: No apparent anesthesia complications

## 2011-05-11 NOTE — Addendum Note (Signed)
Addendum  created 05/11/11 0752 by Fanny Dance, CRNA   Modules edited:Charges VN, Notes Section

## 2011-05-12 NOTE — Progress Notes (Signed)
Post Partum Day 1 Subjective: no complaints, up ad lib, voiding, tolerating PO and + flatus  Objective: Blood pressure 108/74, pulse 91, temperature 98 F (36.7 C), temperature source Oral, resp. rate 18, last menstrual period 08/21/2010, unknown if currently breastfeeding.  Physical Exam:  General: no distress Lochia: appropriate Uterine Fundus: firm Incision: N/A DVT Evaluation: No evidence of DVT seen on physical exam. No cords or calf tenderness. No significant calf/ankle edema.   Basename 05/10/11 1250  HGB 10.3*  HCT 29.6*    Assessment/Plan: Anticipate discharge tomorrow Breast and bottle-feeding Circumcision: outpatient Contraception: Depo-Provera Pain medication: ibuprofen prn  Follow-up: [facility] (prenatal care at [facility] for hyperthyroidism)    LOS: 2 days   [place] PARK, Anoushka Divito 05/12/2011, 7:26 AM

## 2011-05-13 MED ORDER — IBUPROFEN 600 MG PO TABS: 600.0000 mg | ORAL_TABLET | Freq: Four times a day (QID) | ORAL | Status: AC

## 2011-05-13 NOTE — Discharge Summary (Signed)
Obstetric Discharge Summary Reason for Admission: onset of labor and rupture of membranes Prenatal Procedures: none Intrapartum Procedures: spontaneous vaginal delivery Postpartum Procedures: none Complications-Operative and Postpartum: none Hemoglobin  Date Value Range Status  05/10/2011 10.3* 12.0-15.0 (g/dL) Final     HCT  Date Value Range Status  05/10/2011 29.6* 36.0-46.0 (%) Final    Discharge Diagnoses: Term Pregnancy-delivered  Discharge Information: Date: 05/13/2011 Activity: unrestricted Diet: routine Medications: Ibuprofen Condition: stable Instructions: refer to practice specific booklet Discharge to: home Follow-up Information    Follow up with [facility]. (please keep your appointment on Feb 27th/2013)          Newborn Data: Live born female  Birth Weight: 5 lb 3.3 oz (2360 g) APGAR: 9, 9  Home with mother.  D. Piloto Sherron Flemings Paz. MD PGY-1  05/13/2011, 7:53 AM

## 2011-05-13 NOTE — Progress Notes (Signed)
Post Partum Day 2 Subjective: no complaints  Objective: Blood pressure 102/66, pulse 82, temperature 97.3 F (36.3 C), temperature source Oral, resp. rate 18, last menstrual period 08/21/2010, unknown if currently breastfeeding.  Physical Exam:  General: alert, cooperative and no distress Lochia: appropriate Uterine Fundus: firm Incision: n/a DVT Evaluation: No evidence of DVT seen on physical exam. Negative Homan's sign.   Basename 05/10/11 1250  HGB 10.3*  HCT 29.6*    Assessment/Plan: Discharge home Breast and bottle-feeding  Circumcision: outpatient  Contraception: Depo-Provera  Pain medication: ibuprofen prn  Follow-up: [facility] (prenatal care at [facility] for hyperthyroidism)    LOS: 3 days    D. Piloto [employer]. MD PGY-1  05/13/2011, 7:47 AM

## 2011-06-16 ENCOUNTER — Ambulatory Visit (INDEPENDENT_AMBULATORY_CARE_PROVIDER_SITE_OTHER): Payer: Medicaid Other | Admitting: Obstetrics and Gynecology

## 2011-06-16 ENCOUNTER — Encounter: Payer: Self-pay | Admitting: Obstetrics and Gynecology

## 2011-06-16 VITALS — BP 137/83 | HR 109 | Temp 98.0°F | Ht 64.25 in | Wt 152.4 lb

## 2011-06-16 DIAGNOSIS — Z3049 Encounter for surveillance of other contraceptives: Secondary | ICD-10-CM

## 2011-06-16 LAB — POCT PREGNANCY, URINE: Preg Test, Ur: NEGATIVE

## 2011-06-16 MED ORDER — MEDROXYPROGESTERONE ACETATE 150 MG/ML IM SUSP
150.0000 mg | Freq: Once | INTRAMUSCULAR | Status: AC
  Administered 2011-06-16: 150 mg via INTRAMUSCULAR

## 2011-06-16 NOTE — Progress Notes (Signed)
Pt would like Depo Provera

## 2011-06-16 NOTE — Progress Notes (Signed)
  Subjective:     Grace [name] is a 26 y.o. female who presents for a postpartum visit. She is 5 weeks postpartum following a spontaneous vaginal delivery. I have fully reviewed the prenatal and intrapartum course. The delivery was at 37 gestational weeks. Outcome: spontaneous vaginal delivery. Anesthesia: spinal. Postpartum course has been uncomplicated. Baby's course has been uncomplicated. Baby is feeding by both breast and bottle - [employer]facare. Bleeding menses started 2 days ago. Bowel function is normal. Bladder function is normal. Patient is not sexually active. Contraception method is none. Postpartum depression screening: negative. Has appt with Endocrine in April to f/u thyroid labs (hx hyperthyroid with normal labs, no meds in pregnancy)  The following portions of the patient's history were reviewed and updated as appropriate: past family history, past medical history, past social history, past surgical history and problem list.  Review of Systems Pertinent items are noted in HPI.   Objective:    BP 137/83  Pulse 109  Temp(Src) 98 F (36.7 C) (Oral)  Ht 5' 4.25" (1.632 m)  Wt 152 lb 6.4 oz (69.128 kg)  BMI 25.96 kg/m2  LMP 06/14/2011  Breastfeeding? Yes  General:  alert, cooperative, appears stated age and no distress   Breasts:  negative and lactational  Lungs: clear to auscultation bilaterally  Heart:  regular rate and rhythm, S1, S2 normal, no murmur, click, rub or gallop  Abdomen: soft, non-tender; bowel sounds normal; no masses,  no organomegaly   Vulva:  normal  Vagina: normal vagina and menstrual blood  Cervix:  multiparous appearance and no cervical motion tenderness  Corpus: normal size, contour, position, consistency, mobility, non-tender  Adnexa:  normal adnexa  Rectal Exam: Not performed.        Assessment:    Doing well at5 wk postpartum exam. Pap smear not done at today's visit.  Hx hyperthyroidism Plan:    1. Contraception: Depo-Provera injections 2.  Will give Depoprovera today 3. Follow up in: 3 months or as needed.  Needs Pap in 6  Months Keep appt with endocrinologist as scheduled

## 2011-06-16 NOTE — Progress Notes (Signed)
Addended by: Sherre Lain A on: 06/16/2011 03:50 PM   Modules accepted: Orders

## 2011-06-16 NOTE — Patient Instructions (Signed)
  PReturn every 3 months for the Depo shot and you need a Pap in 6 months.

## 2011-08-12 ENCOUNTER — Encounter (HOSPITAL_COMMUNITY): Payer: Self-pay | Admitting: Emergency Medicine

## 2011-08-12 ENCOUNTER — Emergency Department (HOSPITAL_COMMUNITY)
Admission: EM | Admit: 2011-08-12 | Discharge: 2011-08-12 | Disposition: A | Discharge status: Home / Self Care | Payer: Medicaid Other | Attending: Emergency Medicine | Admitting: Emergency Medicine

## 2011-08-12 ENCOUNTER — Emergency Department (HOSPITAL_COMMUNITY): Payer: Medicaid Other

## 2011-08-12 DIAGNOSIS — R110 Nausea: Secondary | ICD-10-CM | POA: Insufficient documentation

## 2011-08-12 DIAGNOSIS — D573 Sickle-cell trait: Secondary | ICD-10-CM | POA: Insufficient documentation

## 2011-08-12 DIAGNOSIS — K8020 Calculus of gallbladder without cholecystitis without obstruction: Secondary | ICD-10-CM | POA: Insufficient documentation

## 2011-08-12 DIAGNOSIS — R945 Abnormal results of liver function studies: Secondary | ICD-10-CM | POA: Insufficient documentation

## 2011-08-12 DIAGNOSIS — E0590 Thyrotoxicosis, unspecified without thyrotoxic crisis or storm: Secondary | ICD-10-CM | POA: Insufficient documentation

## 2011-08-12 DIAGNOSIS — M549 Dorsalgia, unspecified: Secondary | ICD-10-CM | POA: Insufficient documentation

## 2011-08-12 DIAGNOSIS — R1013 Epigastric pain: Secondary | ICD-10-CM | POA: Insufficient documentation

## 2011-08-12 DIAGNOSIS — N189 Chronic kidney disease, unspecified: Secondary | ICD-10-CM | POA: Insufficient documentation

## 2011-08-12 LAB — COMPREHENSIVE METABOLIC PANEL
BUN: 11 mg/dL (ref 6–23)
CO2: 26 mEq/L (ref 19–32)
Calcium: 9.4 mg/dL (ref 8.4–10.5)
Creatinine, Ser: 0.82 mg/dL (ref 0.50–1.10)
GFR calc Af Amer: >90 mL/min (ref >90)
GFR calc non Af Amer: >90 mL/min (ref >90)
Glucose, Bld: 106 mg/dL — ABNORMAL HIGH (ref 70–99)

## 2011-08-12 LAB — URINALYSIS, ROUTINE W REFLEX MICROSCOPIC
Glucose, UA: NEGATIVE mg/dL
Ketones, ur: NEGATIVE mg/dL
Nitrite: NEGATIVE
Specific Gravity, Urine: 1.012 (ref 1.005–1.030)
pH: 6.5 (ref 5.0–8.0)

## 2011-08-12 LAB — URINE MICROSCOPIC-ADD ON

## 2011-08-12 LAB — CBC
MCH: 29.8 pg (ref 26.0–34.0)
Platelets: 359 10*3/uL (ref 150–400)
RBC: 4.46 MIL/uL (ref 3.87–5.11)
WBC: 7.6 10*3/uL (ref 4.0–10.5)

## 2011-08-12 LAB — DIFFERENTIAL
Eosinophils Absolute: 0.2 10*3/uL (ref 0.0–0.7)
Lymphocytes Relative: 40 % (ref 12–46)
Lymphs Abs: 3 10*3/uL (ref 0.7–4.0)
Neutrophils Relative %: 48 % (ref 43–77)

## 2011-08-12 LAB — LIPASE, BLOOD: Lipase: 55 U/L (ref 11–59)

## 2011-08-12 LAB — POCT PREGNANCY, URINE: Preg Test, Ur: NEGATIVE

## 2011-08-12 MED ORDER — OXYCODONE-ACETAMINOPHEN 5-325 MG PO TABS: 1.0000 | ORAL_TABLET | ORAL | Status: DC | PRN

## 2011-08-12 MED ORDER — HYDROMORPHONE HCL PF 1 MG/ML IJ SOLN
1.0000 mg | Freq: Once | INTRAMUSCULAR | Status: AC
  Administered 2011-08-12: 1 mg via INTRAMUSCULAR
  Filled 2011-08-12: qty 1

## 2011-08-12 NOTE — ED Provider Notes (Signed)
History     CSN: [id number]  Arrival date & time 08/12/11  1727   First MD Initiated Contact with Patient 08/12/11 1838      Chief Complaint  Patient presents with  . Abdominal Pain     HPI Pt reports having stomach pains intermittently for 2 weeks, but since Sunday pain has been continuous; pt reports that she has hx of stomach ulcer, reports pain is epigastric; unable to describe pain; denies urinary symptoms; denies fevers/chills; pt reports when laying down it will radiate to back; pt reports pain a/w nausea, denies v/d; pt resp e/u , pt NAD  Past Medical History  Diagnosis Date  . Sickle cell trait   . Headache   . Hyperthyroidism   . Sickle cell trait   . Chronic kidney disease     Past Surgical History  Procedure Date  . Dilation and curettage of uterus     Family History  Problem Relation Age of Onset  . Diabetes Mother   . Cancer Father   . Heart disease Father   . Diabetes Maternal Uncle   . Cancer Maternal Grandmother   . Cancer Maternal Grandfather   . Hypertension Maternal Grandfather   . Diabetes Maternal Grandfather   . Sickle cell anemia Son   . Asthma Son   . Heart disease Paternal Grandfather     History  Substance Use Topics  . Smoking status: Never Smoker   . Smokeless tobacco: Not on file  . Alcohol Use: No    OB History    Grav Para Term Preterm Abortions TAB SAB Ect Mult Living   3 2 2  1  1   2       Review of Systems  All other systems reviewed and are negative.    Allergies  Review of patient's allergies indicates no known allergies.  Home Medications   Current Outpatient Rx  Name Route Sig Dispense Refill  . NATACHEW 29-1 MG PO CHEW Oral Chew 1 tablet by mouth daily.      Marland Kitchen RANITIDINE HCL 150 MG PO TABS Oral Take 150 mg by mouth daily as needed. For heartburn    . OXYCODONE-ACETAMINOPHEN 5-325 MG PO TABS Oral Take 1 tablet by mouth every 4 (four) hours as needed for pain. 30 tablet 0    BP 126/86  Pulse 88   Temp(Src) 98.8 F (37.1 C) (Oral)  Resp 18  SpO2 100%  Physical Exam  Nursing note and vitals reviewed. Constitutional: She is oriented to person, place, and time. She appears well-developed and well-nourished. No distress.  HENT:  Head: Normocephalic and atraumatic.  Eyes: Pupils are equal, round, and reactive to light.  Neck: Normal range of motion.  Cardiovascular: Normal rate and intact distal pulses.   Pulmonary/Chest: No respiratory distress.  Abdominal: Soft. Normal appearance and bowel sounds are normal. She exhibits no distension. There is tenderness in the right upper quadrant. There is no rebound and no guarding. No hernia.  Musculoskeletal: Normal range of motion.  Neurological: She is alert and oriented to person, place, and time. No cranial nerve deficit.  Skin: Skin is warm and dry. No rash noted.  Psychiatric: She has a normal mood and affect. Her behavior is normal.    ED Course  Procedures (including critical care time)  Labs Reviewed  URINALYSIS, ROUTINE W REFLEX MICROSCOPIC - Abnormal; Notable for the following:    Leukocytes, UA SMALL (*)    All other components within normal limits  COMPREHENSIVE METABOLIC  PANEL - Abnormal; Notable for the following:    Glucose, Bld 106 (*)    AST 284 (*) HEMOLYZED SPECIMEN, RESULTS MAY BE AFFECTED   ALT 330 (*)    All other components within normal limits  URINE MICROSCOPIC-ADD ON - Abnormal; Notable for the following:    Bacteria, UA FEW (*)    All other components within normal limits  POCT PREGNANCY, URINE  LIPASE, BLOOD  CBC  DIFFERENTIAL   US Abdomen Complete  08/12/2011  *RADIOLOGY REPORT*  Clinical Data:  Epigastric abdominal pain  ABDOMINAL ULTRASOUND COMPLETE  Comparison:  None.  Findings:  Gallbladder:  Gallstones noted dependently within the fundus, largest 7 mm.  No gallbladder wall thickening, or pericholecystic fluid.  The patient is sedated and assessment for sonographic or free sign cannot be performed.   Common Bile Duct:  Within normal limits in caliber.  Liver: No focal mass lesion identified.  Within normal limits in parenchymal echogenicity.  IVC:  Not well visualized.  Visualized portions appear normal.  Pancreas:  Obscured by bowel gas  Spleen:  Within normal limits in size and echotexture.  Right kidney:  Normal in size and parenchymal echogenicity.  No evidence of mass or hydronephrosis.  Left kidney:  Normal in size and parenchymal echogenicity.  No evidence of mass or hydronephrosis.  Abdominal Aorta:  Proximal and mid abdominal aorta are normal. Distal aorta obscured.  IMPRESSION: Gallstones without other sonographic evidence for acute cholecystitis.  No focal abnormality otherwise.  Original Report Authenticated By: Harrel Lemon, M.D.     1. Cholelithiasis   2. Abnormal LFTs       MDM  The case was discussed as dictated with the general surgeon.  He recommended followup in the office as an outpatient.  She was directed to return to the emergency department should she develop increasing pain, fever, nausea and vomiting.       Nelia Shi, MD 08/12/11 [phone]

## 2011-08-12 NOTE — Discharge Instructions (Signed)
Biliary Colic  Biliary colic is a steady or irregular pain in the upper abdomen. It is usually under the right side of the rib cage. It happens when gallstones interfere with the normal flow of bile from the gallbladder. Bile is a liquid that helps to digest fats. Bile is made in the liver and stored in the gallbladder. When you eat a meal, bile passes from the gallbladder through the cystic duct and the common bile duct into the small intestine. There, it mixes with partially digested food. If a gallstone blocks either of these ducts, the normal flow of bile is blocked. The muscle cells in the bile duct contract forcefully to try to move the stone. This causes the pain of biliary colic.  SYMPTOMS   A person with biliary colic usually complains of pain in the upper abdomen. This pain can be:   In the center of the upper abdomen just below the breastbone.   In the upper-right part of the abdomen, near the gallbladder and liver.   Spread back toward the right shoulder blade.   Nausea and vomiting.   The pain usually occurs after eating.   Biliary colic is usually triggered by the digestive system's demand for bile. The demand for bile is high after fatty meals. Symptoms can also occur when a person who has been fasting suddenly eats a very large meal. Most episodes of biliary colic pass after 1 to 5 hours. After the most intense pain passes, your abdomen may continue to ache mildly for about 24 hours.  DIAGNOSIS  After you describe your symptoms, your caregiver will perform a physical exam. He or she will pay attention to the upper right portion of your belly (abdomen). This is the area of your liver and gallbladder. An ultrasound will help your caregiver look for gallstones. Specialized scans of the gallbladder may also be done. Blood tests may be done, especially if you have fever or if your pain persists. PREVENTION  Biliary colic can be prevented by controlling the risk factors for gallstones.  Some of these risk factors, such as heredity, increasing age, and pregnancy are a normal part of life. Obesity and a high-fat diet are risk factors you can change through a healthy lifestyle. Women going through menopause who take hormone replacement therapy (estrogen) are also more likely to develop biliary colic. TREATMENT   Pain medication may be prescribed.   You may be encouraged to eat a fat-free diet.   If the first episode of biliary colic is severe, or episodes of colic keep retuning, surgery to remove the gallbladder (cholecystectomy) is usually recommended. This procedure can be done through small incisions using an instrument called a laparoscope. The procedure often requires a brief stay in the hospital. Some people can leave the hospital the same day. It is the most widely used treatment in people troubled by painful gallstones. It is effective and safe, with no complications in more than 90% of cases.   If surgery cannot be done, medication that dissolves gallstones may be used. This medication is expensive and can take months or years to work. Only small stones will dissolve.   Rarely, medication to dissolve gallstones is combined with a procedure called shock-wave lithotripsy. This procedure uses carefully aimed shock waves to break up gallstones. In many people treated with this procedure, gallstones form again within a few years.  PROGNOSIS  If gallstones block your cystic duct or common bile duct, you are at risk for repeated episodes of   biliary colic. There is also a 25% chance that you will develop a gallbladder infection(acute cholecystitis), or some other complication of gallstones within 10 to 20 years. If you have surgery, schedule it at a time that is convenient for you and at a time when you are not sick. HOME CARE INSTRUCTIONS   Drink plenty of clear fluids.   Avoid fatty, greasy or fried foods, or any foods that make your pain worse.   Take medications as directed.    SEEK MEDICAL CARE IF:   You develop a fever over 100.5 F (38.1 C).   Your pain gets worse over time.   You develop nausea that prevents you from eating and drinking.   You develop vomiting.  SEEK IMMEDIATE MEDICAL CARE IF:   You have continuous or severe belly (abdominal) pain which is not relieved with medications.   You develop nausea and vomiting which is not relieved with medications.   You have symptoms of biliary colic and you suddenly develop a fever and shaking chills. This may signal cholecystitis. Call your caregiver immediately.   You develop a yellow color to your skin or the white part of your eyes (jaundice).  Document Released: 09/06/2005 Document Revised: 03/25/2011 Document Reviewed: 11/16/2007 [facility] Patient Information 2012 [place], [place].Gallbladder Disease Gallbladder disease (cholecystitis) is an inflammation of your gallbladder. It is usually caused by a build-up of stones (gallstones) or sludge (cholelithiasis) in your gallbladder. The gallbladder is not an essential organ. It is located slightly to the right of center in the belly (abdomen), behind the liver. It stores bile made in the liver. Bile aids in digestion of fats. Gallbladder disease may result in nausea (feeling sick to your stomach), abdominal pain, and jaundice. In severe cases, emergency surgery may be required. The most common type of gallbladder disease is gallstones. They begin as small crystals and slowly grow into stones. Gallstone pain occurs when the bile duct has spasms. The spasms are caused by the stone passing out of the duct. The stone is trying to pass at the same time bile is passing into the small bowel for digestion. The pain usually begins suddenly. It may persist from several minutes to several hours. Infection can occur. Infection can add to discomfort and severity of an acute attack. The pain may be made worse by breathing deeply or by being jarred. There may be fever and  tenderness to the touch. In some cases, when gallstones do not move into the bile duct, people have no pain or symptoms. These are called "silent" gallstones. Women are three times more likely to develop gallstones than men. Women who have had several pregnancies are more likely to have gallbladder disease. Physicians sometimes advise removing diseased gallbladders before future pregnancies. Other factors that increase the risk of gallbladder disease are obesity, diets heavy in fried foods and dairy products, increasing age, prolonged use of medications containing female hormones, and heredity. HOME CARE INSTRUCTIONS   If your physician prescribed an antibiotic, take as directed.   Only take over-the-counter or prescription medicines for pain, discomfort, or fever as directed by your caregiver.   Follow a low fat diet until seen again. (Fat causes the gallbladder to contract.)   Follow-up as instructed. Attacks are almost always recurrent and surgery is usually required for permanent treatment.  SEEK IMMEDIATE MEDICAL CARE IF:   Pain is increasing and not controlled by medications.   The pain moves to another part of your abdomen or to your back. (Right sided pain  can be appendicitis and left sided pain in adults can be diverticulitis).   You have a fever.   You develop nausea and vomiting.  Document Released: 04/05/2005 Document Revised: 03/25/2011 Document Reviewed: 02/19/2011 [facility] Patient Information 2012 [place], [place].

## 2011-08-12 NOTE — ED Notes (Signed)
Patient transported to Ultrasound 

## 2011-08-12 NOTE — ED Notes (Signed)
Pt reports having stomach pains intermittently for 2 weeks, but since Sunday pain has been continuous; pt reports that she has hx of stomach ulcer, reports pain is epigastric; unable to describe pain; denies urinary symptoms; denies fevers/chills; pt reports when laying down it will radiate to back; pt reports pain a/w nausea, denies v/d; pt resp e/u , pt NAD

## 2011-08-18 ENCOUNTER — Inpatient Hospital Stay (HOSPITAL_COMMUNITY)
Admission: EM | Admit: 2011-08-18 | Discharge: 2011-08-20 | DRG: 419 | Disposition: A | Discharge status: Home / Self Care | Payer: Medicaid Other | Attending: General Surgery | Admitting: General Surgery

## 2011-08-18 ENCOUNTER — Encounter (HOSPITAL_COMMUNITY): Payer: Self-pay | Admitting: Family Medicine

## 2011-08-18 DIAGNOSIS — E0590 Thyrotoxicosis, unspecified without thyrotoxic crisis or storm: Secondary | ICD-10-CM | POA: Diagnosis present

## 2011-08-18 DIAGNOSIS — K8010 Calculus of gallbladder with chronic cholecystitis without obstruction: Secondary | ICD-10-CM

## 2011-08-18 DIAGNOSIS — K819 Cholecystitis, unspecified: Secondary | ICD-10-CM

## 2011-08-18 DIAGNOSIS — K810 Acute cholecystitis: Principal | ICD-10-CM | POA: Diagnosis present

## 2011-08-18 HISTORY — DX: Cholecystitis, unspecified: K81.9

## 2011-08-18 LAB — COMPREHENSIVE METABOLIC PANEL
ALT: 92 U/L — ABNORMAL HIGH (ref 0–35)
AST: 24 U/L (ref 0–37)
Albumin: 4.2 g/dL (ref 3.5–5.2)
Alkaline Phosphatase: 88 U/L (ref 39–117)
Potassium: 3.6 mEq/L (ref 3.5–5.1)
Sodium: 133 mEq/L — ABNORMAL LOW (ref 135–145)
Total Protein: 8.1 g/dL (ref 6.0–8.3)

## 2011-08-18 LAB — CBC
MCH: 29.8 pg (ref 26.0–34.0)
MCHC: 35.5 g/dL (ref 30.0–36.0)
Platelets: 358 10*3/uL (ref 150–400)
RBC: 4.66 MIL/uL (ref 3.87–5.11)

## 2011-08-18 LAB — DIFFERENTIAL
Basophils Absolute: 0 10*3/uL (ref 0.0–0.1)
Basophils Relative: 0 % (ref 0–1)
Eosinophils Absolute: 0.1 10*3/uL (ref 0.0–0.7)
Lymphs Abs: 2.3 10*3/uL (ref 0.7–4.0)
Neutrophils Relative %: 74 % (ref 43–77)

## 2011-08-18 MED ORDER — DIPHENHYDRAMINE HCL 12.5 MG/5ML PO ELIX
12.5000 mg | ORAL_SOLUTION | Freq: Four times a day (QID) | ORAL | Status: DC | PRN
  Filled 2011-08-18: qty 10

## 2011-08-18 MED ORDER — ONDANSETRON HCL 4 MG/2ML IJ SOLN
4.0000 mg | Freq: Four times a day (QID) | INTRAMUSCULAR | Status: DC | PRN
  Administered 2011-08-18 (×2): 4 mg via INTRAVENOUS
  Filled 2011-08-18 (×2): qty 2

## 2011-08-18 MED ORDER — SODIUM CHLORIDE 0.9 % IV BOLUS (SEPSIS)
1000.0000 mL | Freq: Once | INTRAVENOUS | Status: AC
  Administered 2011-08-18: 1000 mL via INTRAVENOUS

## 2011-08-18 MED ORDER — PANTOPRAZOLE SODIUM 40 MG PO TBEC
40.0000 mg | DELAYED_RELEASE_TABLET | Freq: Every day | ORAL | Status: DC
  Administered 2011-08-18: 40 mg via ORAL
  Filled 2011-08-18: qty 1

## 2011-08-18 MED ORDER — ONDANSETRON HCL 4 MG/2ML IJ SOLN
4.0000 mg | Freq: Once | INTRAMUSCULAR | Status: AC
  Administered 2011-08-18: 4 mg via INTRAVENOUS
  Filled 2011-08-18: qty 2

## 2011-08-18 MED ORDER — CIPROFLOXACIN IN D5W 400 MG/200ML IV SOLN
400.0000 mg | Freq: Two times a day (BID) | INTRAVENOUS | Status: DC
  Administered 2011-08-18 – 2011-08-19 (×4): 400 mg via INTRAVENOUS
  Filled 2011-08-18 (×6): qty 200

## 2011-08-18 MED ORDER — DIPHENHYDRAMINE HCL 50 MG/ML IJ SOLN: 12.5000 mg | Freq: Four times a day (QID) | INTRAMUSCULAR | Status: DC | PRN

## 2011-08-18 MED ORDER — KCL IN DEXTROSE-NACL 20-5-0.45 MEQ/L-%-% IV SOLN
INTRAVENOUS | Status: DC
  Administered 2011-08-18: 13:00:00 via INTRAVENOUS
  Filled 2011-08-18 (×6): qty 1000

## 2011-08-18 MED ORDER — MORPHINE SULFATE 2 MG/ML IJ SOLN
1.0000 mg | INTRAMUSCULAR | Status: DC | PRN
  Administered 2011-08-18 (×2): 2 mg via INTRAVENOUS
  Administered 2011-08-18: 4 mg via INTRAVENOUS
  Administered 2011-08-18 – 2011-08-19 (×3): 2 mg via INTRAVENOUS
  Filled 2011-08-18: qty 2
  Filled 2011-08-18 (×5): qty 1

## 2011-08-18 MED ORDER — HYDROMORPHONE HCL PF 1 MG/ML IJ SOLN
1.0000 mg | Freq: Once | INTRAMUSCULAR | Status: AC
  Administered 2011-08-18: 1 mg via INTRAVENOUS
  Filled 2011-08-18: qty 1

## 2011-08-18 NOTE — ED Provider Notes (Signed)
History     CSN: [id number]  Arrival date & time 08/18/11  0810   First MD Initiated Contact with Patient 08/18/11 0827      Chief Complaint  Patient presents with  . Abdominal Pain    (Consider location/radiation/quality/duration/timing/severity/associated sxs/prior treatment) Patient is a 26 y.o. female presenting with abdominal pain. The history is provided by the patient.  Abdominal Pain The primary symptoms of the illness include abdominal pain, nausea and vomiting. The primary symptoms of the illness do not include fever, diarrhea or dysuria. Episode onset: started over a week ago and dx with cholelithiasis but pain got much worse 2 days ago. The onset of the illness was gradual. The problem has been rapidly worsening.  The pain came on gradually. The abdominal pain has been rapidly worsening since its onset. The abdominal pain is located in the RUQ. The abdominal pain radiates to the back (right shoulder). The severity of the abdominal pain is 10/10. The abdominal pain is relieved by nothing. The abdominal pain is exacerbated by eating.  The vomiting began yesterday. Vomiting occurred once. The emesis contains stomach contents.  Additional symptoms associated with the illness include anorexia. Significant associated medical issues include gallstones.    Past Medical History  Diagnosis Date  . Sickle cell trait   . Headache   . Hyperthyroidism   . Sickle cell trait   . Chronic kidney disease     Past Surgical History  Procedure Date  . Dilation and curettage of uterus     Family History  Problem Relation Age of Onset  . Diabetes Mother   . Cancer Father   . Heart disease Father   . Diabetes Maternal Uncle   . Cancer Maternal Grandmother   . Cancer Maternal Grandfather   . Hypertension Maternal Grandfather   . Diabetes Maternal Grandfather   . Sickle cell anemia Son   . Asthma Son   . Heart disease Paternal Grandfather     History  Substance Use Topics  .  Smoking status: Never Smoker   . Smokeless tobacco: Not on file  . Alcohol Use: No    OB History    Grav Para Term Preterm Abortions TAB SAB Ect Mult Living   3 2 2  1  1   2       Review of Systems  Constitutional: Negative for fever.  Gastrointestinal: Positive for nausea, vomiting, abdominal pain and anorexia. Negative for diarrhea.  Genitourinary: Negative for dysuria.  All other systems reviewed and are negative.    Allergies  Review of patient's allergies indicates no known allergies.  Home Medications   Current Outpatient Rx  Name Route Sig Dispense Refill  . OXYCODONE-ACETAMINOPHEN 5-325 MG PO TABS Oral Take 1 tablet by mouth every 6 (six) hours as needed. For pain      BP 121/87  Pulse 113  Temp(Src) 98.1 F (36.7 C) (Oral)  Resp 16  SpO2 100%  LMP 05/21/2011  Breastfeeding? No  Physical Exam  Nursing note and vitals reviewed. Constitutional: She is oriented to person, place, and time. She appears well-developed and well-nourished. She appears distressed.  HENT:  Head: Normocephalic and atraumatic.  Eyes: EOM are normal. Pupils are equal, round, and reactive to light.  Cardiovascular: Normal rate, regular rhythm, normal heart sounds and intact distal pulses.  Exam reveals no friction rub.   No murmur heard. Pulmonary/Chest: Effort normal and breath sounds normal. She has no wheezes. She has no rales.  Abdominal: Soft. Bowel sounds are  normal. She exhibits no distension. There is tenderness in the right upper quadrant. There is guarding and positive Murphy's sign. There is no rebound and no CVA tenderness.       Mild epigastric pain but no LUQ, RLQ or LLQ pain  Musculoskeletal: Normal range of motion. She exhibits no tenderness.       No edema  Neurological: She is alert and oriented to person, place, and time. No cranial nerve deficit.  Skin: Skin is warm and dry. No rash noted.  Psychiatric: She has a normal mood and affect. Her behavior is normal.     ED Course  Procedures (including critical care time)  Labs Reviewed  CBC - Abnormal; Notable for the following:    WBC 14.1 (*)    All other components within normal limits  DIFFERENTIAL - Abnormal; Notable for the following:    Neutro Abs 10.4 (*)    Monocytes Absolute 1.3 (*)    All other components within normal limits  COMPREHENSIVE METABOLIC PANEL - Abnormal; Notable for the following:    Sodium 133 (*)    Glucose, Bld 127 (*)    ALT 92 (*)    All other components within normal limits  LIPASE, BLOOD  SURGICAL PCR SCREEN   No results found.   1. Cholecystitis       MDM   Patient returning with worsening abdominal pain. She was diagnosed with gallstones last week and given Percocet. She states the medications stopped helping her pain about 2 days ago. Now she is anorexic with worsening pain that radiates into her back and her right shoulder. She vomited times one last night when trying to eat but denies fever. She has positive Murphy's sign and severe pain in the right upper quadrant. She is mildly tachycardic otherwise well appearing.  Repeat CBC, CMP and lipase pending. Patient given IV pain medication and will consult surgery.        Gwyneth Sprout, MD 08/18/11 1512

## 2011-08-18 NOTE — ED Notes (Signed)
sts was here last week and dx with gallstones. Pt reports increasing pain and meds not working. sts stomach, side and back pain

## 2011-08-18 NOTE — H&P (Signed)
Grace [name] 04/12/1986  [id number].   Primary Care MD: Dr. Lucianne Muss Requesting MD: Dr. Anitra Lauth Chief Complaint/Reason for Consult: acute cholecystitis HPI: This is a 26 yo female who just delivered her second child in January.  About 2 weeks ago she developed pain in her epigastrium and RUQ.  It was associated with chills, nausea, and vomiting.  She came to the [facility] last week and had an ultrasound that revealed gallstones but no evidence of cholecystitis.  She was given percocet and told to follow up with [facility].  The first couple of days the pain meds worked, but over the last several days her pain has continued to worsen.  She has developed anorexia.  She tried to eat a little this morning, but threw it up.  Because her pain has worsened, she presented back to [facility] today where she was noted to have a WBC of 14K.  We have been asked to see her for admission.  Review of Systems: Please see HPI, otherwise all other systems have been reviewed and are negative.  Family History  Problem Relation Age of Onset  . Diabetes Mother   . Cancer Father   . Heart disease Father   . Diabetes Maternal Uncle   . Cancer Maternal Grandmother   . Cancer Maternal Grandfather   . Hypertension Maternal Grandfather   . Diabetes Maternal Grandfather   . Sickle cell anemia Son   . Asthma Son   . Heart disease Paternal Grandfather     Past Medical History  Diagnosis Date  . Sickle cell trait   . Headache   . Hyperthyroidism   . Sickle cell trait   . Chronic kidney disease     Past Surgical History  Procedure Date  . Dilation and curettage of uterus     Social History:  reports that she has never smoked. She does not have any smokeless tobacco history on file. She reports that she does not drink alcohol or use illicit drugs.  Allergies: No Known Allergies   (Not in a hospital admission)  Blood pressure 108/59, pulse 87, temperature 98.1 F (36.7 C), temperature source Oral, resp. rate 16, last  menstrual period 05/21/2011, SpO2 100.00%, not currently breastfeeding. Physical Exam: General: pleasant, WD, WN black female who is laying in bed in NAD HEENT: head is normocephalic, atraumatic.  Sclera are noninjected.  PERRL.  Ears and nose without any masses or lesions.  Mouth is pink and moist Heart: regular, rate, and rhythm.  Normal s1,s2. No obvious murmurs, gallops, or rubs noted.  Palpable radial and pedal pulses bilaterally Lungs: CTAB, no wheezes, rhonchi, or rales noted.  Respiratory effort nonlabored Abd: soft, tender in her RUQ and epigastrium, + Murphy's sign, ND, +BS, no masses, hernias, or organomegaly MS: all 4 extremities are symmetrical with no cyanosis, clubbing, or edema. Skin: warm and dry with no masses, lesions, or rashes Psych: A&Ox3 with an appropriate affect.    Results for orders placed during the hospital encounter of 08/18/11 (from the past 48 hour(s))  CBC     Status: Abnormal   Collection Time   08/18/11  8:29 AM      Component Value Range Comment   WBC 14.1 (*) 4.0 - 10.5 (K/uL)    RBC 4.66  3.87 - 5.11 (MIL/uL)    Hemoglobin 13.9  12.0 - 15.0 (g/dL)    HCT 69.6  29.5 - 28.4 (%)    MCV 83.9  78.0 - 100.0 (fL)    MCH 29.8  26.0 - 34.0 (pg)    MCHC 35.5  30.0 - 36.0 (g/dL)    RDW 46.9  62.9 - 52.8 (%)    Platelets 358  150 - 400 (K/uL)   DIFFERENTIAL     Status: Abnormal   Collection Time   08/18/11  8:29 AM      Component Value Range Comment   Neutrophils Relative 74  43 - 77 (%)    Neutro Abs 10.4 (*) 1.7 - 7.7 (K/uL)    Lymphocytes Relative 16  12 - 46 (%)    Lymphs Abs 2.3  0.7 - 4.0 (K/uL)    Monocytes Relative 9  3 - 12 (%)    Monocytes Absolute 1.3 (*) 0.1 - 1.0 (K/uL)    Eosinophils Relative 1  0 - 5 (%)    Eosinophils Absolute 0.1  0.0 - 0.7 (K/uL)    Basophils Relative 0  0 - 1 (%)    Basophils Absolute 0.0  0.0 - 0.1 (K/uL)   COMPREHENSIVE METABOLIC PANEL     Status: Abnormal   Collection Time   08/18/11  8:29 AM      Component Value  Range Comment   Sodium 133 (*) 135 - 145 (mEq/L)    Potassium 3.6  3.5 - 5.1 (mEq/L)    Chloride 99  96 - 112 (mEq/L)    CO2 23  19 - 32 (mEq/L)    Glucose, Bld 127 (*) 70 - 99 (mg/dL)    BUN 6  6 - 23 (mg/dL)    Creatinine, Ser 4.13  0.50 - 1.10 (mg/dL)    Calcium 9.7  8.4 - 10.5 (mg/dL)    Total Protein 8.1  6.0 - 8.3 (g/dL)    Albumin 4.2  3.5 - 5.2 (g/dL)    AST 24  0 - 37 (U/L)    ALT 92 (*) 0 - 35 (U/L)    Alkaline Phosphatase 88  39 - 117 (U/L)    Total Bilirubin 0.6  0.3 - 1.2 (mg/dL)    GFR calc non Af Amer >90  >90 (mL/min)    GFR calc Af Amer >90  >90 (mL/min)   LIPASE, BLOOD     Status: Normal   Collection Time   08/18/11  8:29 AM      Component Value Range Comment   Lipase 18  11 - 59 (U/L)    No results found.     Assessment/Plan 1. Acute cholecystitis 2. Hyperthyroidism  Plan: 1. The patient does not have an ultrasound confirming acute cholecystitis, but by history and PE it is likely the patient has cholecystitis.  We will get her admitted and started her on cipro.  She will be given clear liquids today and NPO p MN with plans for surgical cholecystectomy tomorrow.  I have d/w the patient and she is agreeable to this plan.  OSBORNE,KELLY E 08/18/2011, 10:55 AM  Pt seen and evaluated last night.  Given the chronicity and persistent pain, I am concerned for cholecystitis. Plan for lap cholecystectomy/IOC

## 2011-08-19 ENCOUNTER — Encounter (HOSPITAL_COMMUNITY): Payer: Self-pay | Admitting: Anesthesiology

## 2011-08-19 ENCOUNTER — Inpatient Hospital Stay (HOSPITAL_COMMUNITY): Payer: Medicaid Other

## 2011-08-19 ENCOUNTER — Encounter (HOSPITAL_COMMUNITY)
Admission: EM | Disposition: A | Discharge status: Home / Self Care | Payer: Self-pay | Source: Home / Self Care | Attending: General Surgery

## 2011-08-19 ENCOUNTER — Inpatient Hospital Stay (HOSPITAL_COMMUNITY): Payer: Medicaid Other | Admitting: Anesthesiology

## 2011-08-19 HISTORY — PX: CHOLECYSTECTOMY: SHX55

## 2011-08-19 LAB — CBC
HCT: 35.8 % — ABNORMAL LOW (ref 36.0–46.0)
Platelets: 322 10*3/uL (ref 150–400)
RDW: 13 % (ref 11.5–15.5)
WBC: 11.3 10*3/uL — ABNORMAL HIGH (ref 4.0–10.5)

## 2011-08-19 LAB — COMPREHENSIVE METABOLIC PANEL
ALT: 76 U/L — ABNORMAL HIGH (ref 0–35)
Alkaline Phosphatase: 72 U/L (ref 39–117)
BUN: 3 mg/dL — ABNORMAL LOW (ref 6–23)
CO2: 22 mEq/L (ref 19–32)
Calcium: 9 mg/dL (ref 8.4–10.5)
Creatinine, Ser: 0.72 mg/dL (ref 0.50–1.10)
GFR calc Af Amer: >90 mL/min (ref >90)
GFR calc non Af Amer: >90 mL/min (ref >90)
Glucose, Bld: 114 mg/dL — ABNORMAL HIGH (ref 70–99)
Potassium: 4.4 mEq/L (ref 3.5–5.1)
Sodium: 137 mEq/L (ref 135–145)
Total Protein: 7 g/dL (ref 6.0–8.3)

## 2011-08-19 SURGERY — LAPAROSCOPIC CHOLECYSTECTOMY WITH INTRAOPERATIVE CHOLANGIOGRAM
Anesthesia: General | Site: Abdomen | Wound class: Contaminated

## 2011-08-19 MED ORDER — 0.9 % SODIUM CHLORIDE (POUR BTL) OPTIME
TOPICAL | Status: DC | PRN
  Administered 2011-08-19: 1000 mL

## 2011-08-19 MED ORDER — OXYCODONE-ACETAMINOPHEN 5-325 MG PO TABS
1.0000 | ORAL_TABLET | ORAL | Status: DC | PRN
  Administered 2011-08-20 (×2): 1 via ORAL
  Filled 2011-08-19 (×2): qty 1

## 2011-08-19 MED ORDER — BUPIVACAINE HCL (PF) 0.25 % IJ SOLN
INTRAMUSCULAR | Status: DC | PRN
  Administered 2011-08-19: 30 mL

## 2011-08-19 MED ORDER — KCL IN DEXTROSE-NACL 20-5-0.45 MEQ/L-%-% IV SOLN
INTRAVENOUS | Status: DC
  Filled 2011-08-19 (×5): qty 1000

## 2011-08-19 MED ORDER — HYDROMORPHONE HCL PF 1 MG/ML IJ SOLN
0.2500 mg | INTRAMUSCULAR | Status: DC | PRN
  Administered 2011-08-19: 0.5 mg via INTRAVENOUS

## 2011-08-19 MED ORDER — MIDAZOLAM HCL 5 MG/5ML IJ SOLN
INTRAMUSCULAR | Status: DC | PRN
  Administered 2011-08-19: 2 mg via INTRAVENOUS

## 2011-08-19 MED ORDER — ONDANSETRON HCL 4 MG/2ML IJ SOLN
INTRAMUSCULAR | Status: DC | PRN
  Administered 2011-08-19: 4 mg via INTRAVENOUS

## 2011-08-19 MED ORDER — LACTATED RINGERS IV SOLN
INTRAVENOUS | Status: DC | PRN
  Administered 2011-08-19 (×2): via INTRAVENOUS

## 2011-08-19 MED ORDER — ROCURONIUM BROMIDE 100 MG/10ML IV SOLN
INTRAVENOUS | Status: DC | PRN
  Administered 2011-08-19: 40 mg via INTRAVENOUS
  Administered 2011-08-19: 5 mg via INTRAVENOUS
  Administered 2011-08-19: 10 mg via INTRAVENOUS

## 2011-08-19 MED ORDER — GLYCOPYRROLATE 0.2 MG/ML IJ SOLN
INTRAMUSCULAR | Status: DC | PRN
  Administered 2011-08-19: 0.6 mg via INTRAVENOUS

## 2011-08-19 MED ORDER — SODIUM CHLORIDE 0.9 % IV SOLN
INTRAVENOUS | Status: DC | PRN
  Administered 2011-08-19: 10:00:00

## 2011-08-19 MED ORDER — LACTATED RINGERS IV SOLN
INTRAVENOUS | Status: DC
  Administered 2011-08-19: 10:00:00 via INTRAVENOUS

## 2011-08-19 MED ORDER — PHENYLEPHRINE HCL 10 MG/ML IJ SOLN
INTRAMUSCULAR | Status: DC | PRN
  Administered 2011-08-19: 80 ug via INTRAVENOUS
  Administered 2011-08-19: 40 ug via INTRAVENOUS

## 2011-08-19 MED ORDER — SODIUM CHLORIDE 0.9 % IR SOLN
Status: DC | PRN
  Administered 2011-08-19: 1000 mL

## 2011-08-19 MED ORDER — LIDOCAINE-EPINEPHRINE 1 %-1:100000 IJ SOLN
INTRAMUSCULAR | Status: DC | PRN
  Administered 2011-08-19: 30 mL

## 2011-08-19 MED ORDER — KETOROLAC TROMETHAMINE 30 MG/ML IJ SOLN
INTRAMUSCULAR | Status: DC | PRN
  Administered 2011-08-19: 30 mg via INTRAVENOUS

## 2011-08-19 MED ORDER — NEOSTIGMINE METHYLSULFATE 1 MG/ML IJ SOLN
INTRAMUSCULAR | Status: DC | PRN
  Administered 2011-08-19: 4 mg via INTRAVENOUS

## 2011-08-19 MED ORDER — PROPOFOL 10 MG/ML IV EMUL
INTRAVENOUS | Status: DC | PRN
  Administered 2011-08-19: 200 mg via INTRAVENOUS

## 2011-08-19 MED ORDER — FENTANYL CITRATE 0.05 MG/ML IJ SOLN
INTRAMUSCULAR | Status: DC | PRN
  Administered 2011-08-19 (×3): 50 ug via INTRAVENOUS
  Administered 2011-08-19: 100 ug via INTRAVENOUS
  Administered 2011-08-19: 50 ug via INTRAVENOUS

## 2011-08-19 SURGICAL SUPPLY — 50 items
ADH SKN CLS APL DERMABOND .7 (GAUZE/BANDAGES/DRESSINGS) ×1
APPLIER CLIP ROT 10 11.4 M/L (STAPLE) ×2
APR CLP MED LRG 11.4X10 (STAPLE) ×1
BAG SPEC RTRVL LRG 6X4 10 (ENDOMECHANICALS) ×1
BLADE SURG ROTATE 9660 (MISCELLANEOUS) IMPLANT
CANISTER SUCTION 2500CC (MISCELLANEOUS) ×2 IMPLANT
CATH REDDICK CHOLANGI 4FR 50CM (CATHETERS) ×2 IMPLANT
CHLORAPREP W/TINT 26ML (MISCELLANEOUS) ×2 IMPLANT
CLIP APPLIE ROT 10 11.4 M/L (STAPLE) ×1 IMPLANT
CLOTH BEACON ORANGE TIMEOUT ST (SAFETY) ×2 IMPLANT
COVER SURGICAL LIGHT HANDLE (MISCELLANEOUS) ×2 IMPLANT
DECANTER SPIKE VIAL GLASS SM (MISCELLANEOUS) ×4 IMPLANT
DERMABOND ADVANCED (GAUZE/BANDAGES/DRESSINGS) ×1
DERMABOND ADVANCED .7 DNX12 (GAUZE/BANDAGES/DRESSINGS) ×1 IMPLANT
DRAPE C-ARM 42X72 X-RAY (DRAPES) ×2 IMPLANT
ELECT CAUTERY BLADE 6.4 (BLADE) ×2 IMPLANT
ELECT REM PT RETURN 9FT ADLT (ELECTROSURGICAL) ×2
ELECTRODE REM PT RTRN 9FT ADLT (ELECTROSURGICAL) ×1 IMPLANT
GLOVE BIO SURGEON STRL SZ7 (GLOVE) ×2 IMPLANT
GLOVE BIO SURGEON STRL SZ7.5 (GLOVE) ×1 IMPLANT
GLOVE BIO SURGEON STRL SZ8 (GLOVE) ×1 IMPLANT
GLOVE BIOGEL PI IND STRL 7.0 (GLOVE) IMPLANT
GLOVE BIOGEL PI IND STRL 7.5 (GLOVE) IMPLANT
GLOVE BIOGEL PI IND STRL 8 (GLOVE) IMPLANT
GLOVE BIOGEL PI INDICATOR 7.0 (GLOVE) ×1
GLOVE BIOGEL PI INDICATOR 7.5 (GLOVE) ×1
GLOVE BIOGEL PI INDICATOR 8 (GLOVE) ×1
GLOVE SURG SS PI 7.5 STRL IVOR (GLOVE) ×5 IMPLANT
GOWN PREVENTION PLUS XLARGE (GOWN DISPOSABLE) ×3 IMPLANT
GOWN STRL NON-REIN LRG LVL3 (GOWN DISPOSABLE) ×6 IMPLANT
IV CATH 14GX2 1/4 (CATHETERS) ×2 IMPLANT
KIT BASIN OR (CUSTOM PROCEDURE TRAY) ×2 IMPLANT
KIT ROOM TURNOVER OR (KITS) ×2 IMPLANT
NS IRRIG 1000ML POUR BTL (IV SOLUTION) ×2 IMPLANT
PAD ARMBOARD 7.5X6 YLW CONV (MISCELLANEOUS) ×2 IMPLANT
PENCIL BUTTON HOLSTER BLD 10FT (ELECTRODE) ×2 IMPLANT
POUCH SPECIMEN RETRIEVAL 10MM (ENDOMECHANICALS) ×2 IMPLANT
SCISSORS LAP 5X35 DISP (ENDOMECHANICALS) IMPLANT
SET IRRIG TUBING LAPAROSCOPIC (IRRIGATION / IRRIGATOR) ×2 IMPLANT
SLEEVE ENDOPATH XCEL 5M (ENDOMECHANICALS) ×2 IMPLANT
SPECIMEN JAR SMALL (MISCELLANEOUS) ×2 IMPLANT
SUT MNCRL AB 4-0 PS2 18 (SUTURE) ×4 IMPLANT
SUT VICRYL 0 UR6 27IN ABS (SUTURE) ×4 IMPLANT
TOWEL OR 17X24 6PK STRL BLUE (TOWEL DISPOSABLE) ×2 IMPLANT
TOWEL OR 17X26 10 PK STRL BLUE (TOWEL DISPOSABLE) ×2 IMPLANT
TRAY FOLEY CATH 14FR (SET/KITS/TRAYS/PACK) IMPLANT
TRAY LAPAROSCOPIC (CUSTOM PROCEDURE TRAY) ×2 IMPLANT
TROCAR BALLN 12MMX100 BLUNT (TROCAR) ×2 IMPLANT
TROCAR XCEL NON-BLD 11X100MML (ENDOMECHANICALS) ×2 IMPLANT
TROCAR XCEL NON-BLD 5MMX100MML (ENDOMECHANICALS) ×2 IMPLANT

## 2011-08-19 NOTE — Brief Op Note (Signed)
Procedure(s): LAPAROSCOPIC CHOLECYSTECTOMY WITH INTRAOPERATIVE CHOLANGIOGRAM Procedure Note  Grace [name] female 26 y.o. 08/19/2011  Anesthesia: General endotracheal anesthesia   Surgeon(s) and Role:    * Lodema Pilot, DO - Primary    * Liz Malady, MD - Assisting   Indications: cholecystitis     Surgeon: Lodema Pilot DAVID   Assistants: Janee Morn  Anesthesia: General endotracheal anesthesia  ASA Class:     Procedure Detail  LAPAROSCOPIC CHOLECYSTECTOMY WITH INTRAOPERATIVE CHOLANGIOGRAM  Findings: Severe acute cholecystitis, many gallstones, normal cholangiogram  Estimated Blood Loss:  less than 100 mL         Drains:          Total IV Fluids:  Blood Given: none          Specimens: gallbladder         Implants: none        Complications:  * No complications entered in OR log *         Disposition: PACU - hemodynamically stable.         Condition: stable

## 2011-08-19 NOTE — Anesthesia Procedure Notes (Addendum)
Date/Time: 08/19/2011 11:13 AM Performed by: Donette Larry E   Procedure Name: Intubation Date/Time: 08/19/2011 10:40 AM Performed by: Fransisca Kaufmann Pre-anesthesia Checklist: Patient identified, Suction available, Patient being monitored, Emergency Drugs available and Timeout performed Patient Re-evaluated:Patient Re-evaluated prior to inductionOxygen Delivery Method: Circle system utilized Preoxygenation: Pre-oxygenation with 100% oxygen Intubation Type: IV induction Ventilation: Mask ventilation without difficulty Laryngoscope Size: Miller and 2 Grade View: Grade II Tube type: Oral Tube size: 7.5 mm Number of attempts: 1 Airway Equipment and Method: Stylet Secured at: 22 cm Tube secured with: Tape Dental Injury: Teeth and Oropharynx as per pre-operative assessment

## 2011-08-19 NOTE — Anesthesia Preprocedure Evaluation (Signed)
Anesthesia Evaluation  Patient identified by MRN, date of birth, ID band Patient awake    Reviewed: Allergy & Precautions, H&P , NPO status , Patient's Chart, lab work & pertinent test results  History of Anesthesia Complications Negative for: history of anesthetic complications  Airway Mallampati: II TM Distance: >3 FB Neck ROM: Full    Dental No notable dental hx. (+) Teeth Intact and Dental Advisory Given   Pulmonary neg pulmonary ROS,  breath sounds clear to auscultation  Pulmonary exam normal       Cardiovascular negative cardio ROS  Rate:Normal     Neuro/Psych negative neurological ROS     GI/Hepatic GERD- (n/v with acute cholecystitis)  Poorly Controlled,Elevated LFTs with gallbladder   Endo/Other  negative endocrine ROS  Renal/GU negative Renal ROS     Musculoskeletal   Abdominal (+) + obese,   Peds  Hematology   Anesthesia Other Findings   Reproductive/Obstetrics On DEPO, LMP 2/13, preg test 4/26 negative                           Anesthesia Physical Anesthesia Plan  ASA: II  Anesthesia Plan: General   Post-op Pain Management:    Induction: Intravenous  Airway Management Planned: Oral ETT  Additional Equipment:   Intra-op Plan:   Post-operative Plan: Extubation in OR  Informed Consent: I have reviewed the patients History and Physical, chart, labs and discussed the procedure including the risks, benefits and alternatives for the proposed anesthesia with the patient or authorized representative who has indicated his/her understanding and acceptance.   Dental advisory given  Plan Discussed with: Surgeon and CRNA  Anesthesia Plan Comments: (Plan routine monitors, GETA)        Anesthesia Quick Evaluation

## 2011-08-19 NOTE — Transfer of Care (Signed)
Immediate Anesthesia Transfer of Care Note  Patient: Grace [name]  Procedure(s) Performed: Procedure(s) (LRB): LAPAROSCOPIC CHOLECYSTECTOMY WITH INTRAOPERATIVE CHOLANGIOGRAM (N/A)  Patient Location: PACU  Anesthesia Type: General  Level of Consciousness: awake, alert , oriented and sedated  Airway & Oxygen Therapy: Patient Spontanous Breathing and Patient connected to nasal cannula oxygen  Post-op Assessment: Report given to PACU RN  Post vital signs: Reviewed and stable  Complications: No apparent anesthesia complications

## 2011-08-19 NOTE — Anesthesia Postprocedure Evaluation (Signed)
  Anesthesia Post-op Note  Patient: Grace [name]  Procedure(s) Performed: Procedure(s) (LRB): LAPAROSCOPIC CHOLECYSTECTOMY WITH INTRAOPERATIVE CHOLANGIOGRAM (N/A)  Patient Location: PACU  Anesthesia Type: General  Level of Consciousness: awake, alert  and oriented  Airway and Oxygen Therapy: Patient Spontanous Breathing  Post-op Pain: mild  Post-op Assessment: Post-op Vital signs reviewed, Patient's Cardiovascular Status Stable, Respiratory Function Stable, Patent Airway, No signs of Nausea or vomiting and Pain level controlled  Post-op Vital Signs: Reviewed and stable  Complications: No apparent anesthesia complications

## 2011-08-19 NOTE — Progress Notes (Signed)
Patient ID: Grace [name], female   DOB: 12-16-1985, 26 y.o.   MRN: [id number]    Subjective: Pt still with pain.  Feels better when she's asleep  Objective: Vital signs in last 24 hours: Temp:  [98 F (36.7 C)-99 F (37.2 C)] 99 F (37.2 C) (05/02 0636) Pulse Rate:  [64-114] 64  (05/02 0636) Resp:  [15-18] 18  (05/02 0636) BP: (108-135)/(59-87) 110/67 mmHg (05/02 0636) SpO2:  [93 %-100 %] 100 % (05/02 0636) Weight:  [158 lb 11.7 oz (72 kg)] 158 lb 11.7 oz (72 kg) (05/01 1200)    Intake/Output from previous day: 05/01 0701 - 05/02 0700 In: 4042.5 [P.O.:1080; I.V.:2962.5] Out: -  Intake/Output this shift:    PE: Abd: soft, tender in RUQ, +BS, ND  Lab Results:   [facility] 08/19/11 0640 08/18/11 0829  WBC 11.3* 14.1*  HGB 12.5 13.9  HCT 35.8* 39.1  PLT 322 358   BMET  Basename 08/19/11 0640 08/18/11 0829  NA 137 133*  K 4.4 3.6  CL 102 99  CO2 22 23  GLUCOSE 114* 127*  BUN PENDING 6  CREATININE 0.72 0.64  CALCIUM 9.0 9.7   PT/INR No results found for this basename: LABPROT:2,INR:2 in the last 72 hours CMP     Component Value Date/Time   NA 137 08/19/2011 0640   K 4.4 08/19/2011 0640   CL 102 08/19/2011 0640   CO2 22 08/19/2011 0640   GLUCOSE 114* 08/19/2011 0640   BUN PENDING 08/19/2011 0640   CREATININE 0.72 08/19/2011 0640   CALCIUM 9.0 08/19/2011 0640   PROT 7.0 08/19/2011 0640   ALBUMIN 3.4* 08/19/2011 0640   AST 27 08/19/2011 0640   ALT 76* 08/19/2011 0640   ALKPHOS 72 08/19/2011 0640   BILITOT 0.6 08/19/2011 0640   GFRNONAA >90 08/19/2011 0640   GFRAA >90 08/19/2011 0640   Lipase     Component Value Date/Time   LIPASE 18 08/18/2011 0829       Studies/Results: No results found.  Anti-infectives: Anti-infectives     Start     Dose/Rate Route Frequency Ordered Stop   08/18/11 1300   ciprofloxacin (CIPRO) IVPB 400 mg        400 mg 200 mL/hr over 60 Minutes Intravenous Every 12 hours 08/18/11 1202             Assessment/Plan  1. Acute  cholecystitis  Plan: 1. To OR today for lap chole.   LOS: 1 day    OSBORNE,KELLY E 08/19/2011 Pain improving but still tender.  WBC improved. The risks of infection, bleeding, pain, persistent symptoms, scarring, injury to bowel or bile ducts, retained stone, diarrhea, need for additional procedures, and need for open surgery discussed with the patient.  She desires to proceed with lap chole, possible IOC

## 2011-08-19 NOTE — Preoperative (Signed)
Beta Blockers   Reason not to administer Beta Blockers:Not Applicable 

## 2011-08-20 MED ORDER — OXYCODONE-ACETAMINOPHEN 5-325 MG PO TABS: 1.0000 | ORAL_TABLET | ORAL | Status: AC | PRN

## 2011-08-20 NOTE — Op Note (Signed)
Grace [name], Grace [name]            ACCOUNT NO.:  [id number]  MEDICAL RECORD NO.:  [id number]  LOCATION:  5118                         FACILITY:  [facility]  PHYSICIAN:  Grace Pilot, MD       DATE OF BIRTH:  1986/01/01  DATE OF PROCEDURE:  08/19/2011 DATE OF DISCHARGE:                              OPERATIVE REPORT   PREOPERATIVE DIAGNOSIS:  Acute cholecystitis.  POSTOPERATIVE DIAGNOSIS:  Acute cholecystitis.  PROCEDURE:  Laparoscopic cholecystectomy with intraoperative cholangiogram.  SURGEON:  Grace Pilot, MD  ASSISTANT:  Dr. Janee [name].  ANESTHESIA:  General endotracheal tube anesthesia with 24 mL of 1% lidocaine with epinephrine and 0.25% Marcaine in a 50:50 mixture.  FLUIDS:  1900 mL crystalloid.  ESTIMATED BLOOD LOSS:  Less than 100 mL.  DRAINS:  None.  SPECIMENS:  Gallbladder and contents sent to Pathology for permanent sectioning.  COMPLICATIONS:  None apparent.  FINDINGS:  Several small gallstones with some very large gallstones as well.  Large gallstone impacted at the cystic duct, distorting the anatomy.  She had a short cystic duct, but a cholangiogram was performed, which demonstrated normal common bile duct with normal right and left hepatic ducts and free flow of bile into the duodenum.  INDICATION OF PROCEDURE:  [name] is a 26 year old female who has had several week history of abdominal pain, concerning for symptomatic cholelithiasis.  She presented last week for evaluation in the emergency room, and was felt to have symptomatic cholelithiasis and sent home. She has had persistent symptoms ever since then, and return to the emergency room, was found with elevated white count and symptoms concerning for cholecystitis.  OPERATIVE DETAILS:  [name] was seen and evaluated on the surgical ward and risks and benefits of procedure were discussed in lay terms and informed consent was obtained.  She was on therapeutic antibiotics and taken to the  operating room, placed on table in supine position. General endotracheal tube anesthesia was obtained, and her abdomen was prepped and draped in a standard surgical fashion.  A supraumbilical midline incision was made in the skin and dissection carried down to the abdominal wall fascia using blunt dissection.  The fascia was elevated and sharply incised.  Peritoneum was also elevated and sharply incised. A 12-mm balloon port was placed at the umbilicus and pneumoperitoneum was obtained.  An 11-mm epigastric trocar was placed under direct visualization and two 5-mm right upper quadrant trocars were placed under direct visualization.  We attempted to grasp the gallbladder and retracted cephalad, however, it was so distended that we were not able to grab it, so with a needle aspirator, we aspirated a small amount of the fluid from the gallbladder, which was white and hydropic.  We were then able to grab the gallbladder and retract it cephalad.  She had a very thick rind of the gallbladder and significant amount of edema in the portal region, I  __________ edema and rind off the gallbladder.  I was able to identify the wall of the gallbladder, but visualization was very difficult near Grace [name].  She had a very large gallstone in this area, which limited the retraction and with the significant amount of edema and inflammatory changes, this was  difficult anatomy to visualize.  Cystic artery was identified coursing from the anterior surface of the gallbladder, which then coursed around medial on to the gallbladder and the artery was clipped, but not divided at this time until we were able to visualize better anatomy.  Just underneath this large stone at Grace [name], we were able to identify ductal structure, which we felt was the cystic duct and we skeletonized this structure and then divided the cystic artery in order to help to visualize the cystic duct.  We were able to create a window  to the triangle of Calot and dissect the cystic duct, but it appeared very short and stubby.  A clip was placed on the gallbladder side of the cystic duct, and a small cystic ductotomy was made and a cholangiogram catheter passed through the abdomen through cholangiogram catheter and into the cystic duct.  The cholangiogram was performed, which demonstrated only filling of the distal common bile duct, so we pulled back the catheter, so was not inserted so far into the cystic duct and was clipped into place and another cholangiogram was performed at this time demonstrating normal common bile duct with contrast load in the right and left hepatic ducts and free flow of bile into the duodenum consistent with a short cystic duct.  The catheter was removed and 2 clips were placed on the cystic duct and the duct was transected.  Then, gallbladder was elevated from the gallbladder fossa using blunt dissection until we were well away from the area of concern, and then we were able to use the hook cautery to dissect the gallbladder free from the gallbladder fossa. This was even somewhat difficult because of the thick rind and the gallbladder was intrahepatic, but we were able to completely remove the gallbladder from the gallbladder fossa and placed in an EndoCatch bag.  The fossa was inspected for hemostasis, which was noted to be adequate and the right upper quadrant was irrigated with 2 L of sterile saline solution until the irrigation returned clear.  The clips appeared to be in good position and again the gallbladder fossa was hemostatic without any evidence of bile or bleeding or bowel injury. The gallbladder was removed with the bag through the umbilical fascial site, but the fascial incision and skin incision had to be opened wider to accommodate the large stone and multiple gallstones.  The fascia was approximated in open fashion with several 0 Vicryl interrupted sutures and the sutures  were secured, and the abdomen was re-insufflated with carbon dioxide gas.  The umbilical closure was noted to be adequate without any evidence of bowel injury and the right upper quadrant was noted to be hemostatic and the 5-mm trocars were removed under direct visualization.  The remainder of the trocars were removed and the skin was anesthetized with 24 mL of 1% lidocaine with epinephrine and 0.25% of Marcaine in a 50:50 mixture.  The skin edges were approximated with a 4-0 Monocryl subcuticular suture.  Skin edges were washed and dried, and Dermabond was applied.  All sponge and instrument counts were correct at the end of the case.  The patient tolerated the procedure well without apparent complications.          ______________________________ Grace Pilot, MD     BL/MEDQ  D:  08/19/2011  T:  08/19/2011  Job:  [id number]

## 2011-08-20 NOTE — Discharge Summary (Signed)
Patient ID: NANCEE BROWNRIGG MRN: [id number] DOB/AGE: Mar 15, 1986 26 y.o.  Admit date: 08/18/2011 Discharge date: 08/20/2011  Procedures: lap chole  Consults: None  Reason for Admission: This is a 26 yo female who presented to the [facility] with complaints of abdominal pain.  She was recently let go home from ED with similar pain.  She was felt to have acute cholecystitis and was admitted.  Admission Diagnoses:  1. Acute cholecystitis  Hospital Course: the patient was admitted and taken to the operating room the following day.  She underwent a lap chole with IOC.  She tolerated the procedure well.  On POD# 1, she was tolerating a regular diet and her pain was controlled with oral pain meds.  She was otherwise felt stable for discharge home.  PE: Abd: soft, appropriately tender, +BS, ND, incisions are c/d/i  Discharge Diagnoses:  Principal Problem:  *Acute cholecystitis s/p lap chole  Discharge Medications: Medication List  As of 08/20/2011  8:53 AM   TAKE these medications         oxyCODONE-acetaminophen 5-325 MG per tablet   Commonly known as: PERCOCET   Take 1-2 tablets by mouth every 4 (four) hours as needed.            Discharge Instructions: Follow-up Information    Follow up with LAYTON, BRIAN DAVID, DO. Schedule an appointment as soon as possible for a visit in 3 weeks.   Contact information:   [address] [place] [address] [phone]          Signed: Letha Cape 08/20/2011, 8:53 AM

## 2011-08-20 NOTE — Progress Notes (Signed)
Patient discharged to home in care of family. Medications and instructions reviewed with patient with no questions. IV d/c'd with cath intact. Assessment unchanged from this am. Patient is to follow up with Dr. Biagio Quint in 3 weeks.

## 2011-08-20 NOTE — Care Management Note (Signed)
    Page 1 of 1   08/20/2011     2:42:31 PM   CARE MANAGEMENT NOTE 08/20/2011  Patient:  Grace [name], Grace [name]   Account Number:  [id number]  Date Initiated:  08/19/2011  Documentation initiated by:  Carlyle Lipa  Subjective/Objective Assessment:   abd pain--acute cholecystitis requiring lap chole day after admit     Action/Plan:   likely home tomorrow if stable   Anticipated DC Date:  08/20/2011   Anticipated DC Plan:  HOME/SELF CARE      DC Planning Services  CM consult      Choice offered to / List presented to:             Status of service:  Completed, signed off Medicare Important Message given?   (If response is "NO", the following Medicare IM given date fields will be blank) Date Medicare IM given:   Date Additional Medicare IM given:    Discharge Disposition:  HOME/SELF CARE  Per UR Regulation:  Reviewed for med. necessity/level of care/duration of stay  If discussed at Long Length of Stay Meetings, dates discussed:    Comments:

## 2011-08-20 NOTE — Discharge Instructions (Signed)
[facility] ______[facility] °LAPAROSCOPIC SURGERY: POST OP INSTRUCTIONS °Always review your discharge instruction sheet given to you by the facility where your surgery was performed. °IF YOU HAVE DISABILITY OR FAMILY LEAVE FORMS, YOU MUST BRING THEM TO THE OFFICE FOR PROCESSING.   °DO NOT GIVE THEM TO YOUR DOCTOR. ° °1. A prescription for pain medication may be given to you upon discharge.  Take your pain medication as prescribed, if needed.  If narcotic pain medicine is not needed, then you may take acetaminophen (Tylenol) or ibuprofen (Advil) as needed. °2. Take your usually prescribed medications unless otherwise directed. °3. If you need a refill on your pain medication, please contact your pharmacy.  They will contact our office to request authorization. Prescriptions will not be filled after 5pm or on week-ends. °4. You should follow a light diet the first few days after arrival home, such as soup and crackers, etc.  Be sure to include lots of fluids daily. °5. Most patients will experience some swelling and bruising in the area of the incisions.  Ice packs will help.  Swelling and bruising can take several days to resolve.  °6. It is common to experience some constipation if taking pain medication after surgery.  Increasing fluid intake and taking a stool softener (such as Colace) will usually help or prevent this problem from occurring.  A mild laxative (Milk of Magnesia or Miralax) should be taken according to package instructions if there are no bowel movements after 48 hours. °7. Unless discharge instructions indicate otherwise, you may remove your bandages 24-48 hours after surgery, and you may shower at that time.  You may have steri-strips (small skin tapes) in place directly over the incision.  These strips should be left on the skin for 7-10 days.  If your surgeon used skin glue on the incision, you may shower in 24 hours.  The glue will flake off over the next 2-3 weeks.  Any sutures or  staples will be removed at the office during your follow-up visit. °8. ACTIVITIES:  You may resume regular (light) daily activities beginning the next day--such as daily self-care, walking, climbing stairs--gradually increasing activities as tolerated.  You may have sexual intercourse when it is comfortable.  Refrain from any heavy lifting or straining until approved by your doctor. °a. You may drive when you are no longer taking prescription pain medication, you can comfortably wear a seatbelt, and you can safely maneuver your car and apply brakes. °b. RETURN TO WORK:  __________________________________________________________ °9. You should see your doctor in the office for a follow-up appointment approximately 2-3 weeks after your surgery.  Make sure that you call for this appointment within a day or two after you arrive home to insure a convenient appointment time. °10. OTHER INSTRUCTIONS: __________________________________________________________________________________________________________________________ __________________________________________________________________________________________________________________________ °WHEN TO CALL YOUR DOCTOR: °1. Fever over 101.0 °2. Inability to urinate °3. Continued bleeding from incision. °4. Increased pain, redness, or drainage from the incision. °5. Increasing abdominal pain ° °The clinic staff is available to answer your questions during regular business hours.  Please don’t hesitate to call and ask to speak to one of the nurses for clinical concerns.  If you have a medical emergency, go to the nearest emergency room or call 911.  A surgeon from [facility] is always on call at the hospital. °[address], [address], [place], [place]  [address] ? [address] [address], [place], [place]   [address] °[phone] ? [phone] ? FAX [phone] °Web site:   [email or web address] °

## 2011-08-23 ENCOUNTER — Encounter (HOSPITAL_COMMUNITY): Payer: Self-pay | Admitting: General Surgery

## 2011-09-01 ENCOUNTER — Ambulatory Visit (INDEPENDENT_AMBULATORY_CARE_PROVIDER_SITE_OTHER): Payer: Medicaid Other | Admitting: *Deleted

## 2011-09-01 VITALS — BP 132/87 | HR 123 | Temp 97.8°F

## 2011-09-01 DIAGNOSIS — IMO0001 Reserved for inherently not codable concepts without codable children: Secondary | ICD-10-CM

## 2011-09-01 DIAGNOSIS — Z3049 Encounter for surveillance of other contraceptives: Secondary | ICD-10-CM

## 2011-09-01 MED ORDER — MEDROXYPROGESTERONE ACETATE 150 MG/ML IM SUSP
150.0000 mg | INTRAMUSCULAR | Status: AC
  Administered 2011-09-01 – 2011-11-17 (×2): 150 mg via INTRAMUSCULAR

## 2011-09-01 NOTE — Progress Notes (Signed)
Here for depoprovera, reviewed blood pressures today with Georges Mouse, CNM-may proceed with giving depoprovera for birth control. Pt. Also states she had her gall bladder taken out 08/19/11 and still having some pain- and surgeon won't give her medicine- states told her to go to her medical doctor.  Patient states is not really pain, just discomfort- informed patient she can take ibuprofen otc, if that does not relieve pain/discomfort , or it  continuues without relief, then she should see her medical doctor. (patient understands we are her ob/gyn)

## 2011-11-17 ENCOUNTER — Ambulatory Visit (INDEPENDENT_AMBULATORY_CARE_PROVIDER_SITE_OTHER): Payer: Medicaid Other

## 2011-11-17 VITALS — BP 126/85 | HR 110 | Wt 163.4 lb

## 2011-11-17 DIAGNOSIS — Z3049 Encounter for surveillance of other contraceptives: Secondary | ICD-10-CM

## 2012-02-04 ENCOUNTER — Ambulatory Visit (INDEPENDENT_AMBULATORY_CARE_PROVIDER_SITE_OTHER): Payer: Medicaid Other | Admitting: General Practice

## 2012-02-04 VITALS — BP 125/82 | HR 111 | Temp 97.3°F | Ht 64.0 in | Wt 170.0 lb

## 2012-02-04 DIAGNOSIS — Z3049 Encounter for surveillance of other contraceptives: Secondary | ICD-10-CM

## 2012-02-04 DIAGNOSIS — Z309 Encounter for contraceptive management, unspecified: Secondary | ICD-10-CM

## 2012-02-04 MED ORDER — MEDROXYPROGESTERONE ACETATE 150 MG/ML IM SUSP
150.0000 mg | Freq: Once | INTRAMUSCULAR | Status: AC
  Administered 2012-02-04: 150 mg via INTRAMUSCULAR

## 2012-04-19 NOTE — L&D Delivery Note (Signed)
Delivery Note At 10:40 PM a viable female was delivered over an intact perineum via NSVD (presentation: ROA).  APGAR: 9, 9; weight pending at time of note. Infant placed immediately on maternal abdomen. Cord clamped x2, cut by Aunt. Cord blood obtained. Pitocin infusion was begun and an intact placenta with 3VC was delivered shortly thereafter with CCT without complication.   Anesthesia: Epidural  Episiotomy: None Lacerations: None Suture Repair: N/A Est. Blood Loss (mL): 250  Mom to postpartum.  Baby to Couplet care / Skin to Skin.  Cam Hai, CNM was present throughout delivery.   Grace [name] 03/28/2013, 11:21 PM

## 2012-04-19 NOTE — L&D Delivery Note (Signed)
I have seen and examined this patient and I agree with the above. Cam Hai 11:35 PM 03/28/2013

## 2012-04-21 ENCOUNTER — Ambulatory Visit: Payer: Medicaid Other

## 2012-04-25 ENCOUNTER — Ambulatory Visit: Payer: Self-pay

## 2012-05-02 ENCOUNTER — Ambulatory Visit: Payer: Self-pay

## 2012-05-04 ENCOUNTER — Ambulatory Visit: Payer: Self-pay

## 2012-05-05 ENCOUNTER — Ambulatory Visit: Payer: Self-pay

## 2012-08-29 ENCOUNTER — Encounter (HOSPITAL_COMMUNITY): Payer: Self-pay | Admitting: *Deleted

## 2012-08-29 ENCOUNTER — Inpatient Hospital Stay (HOSPITAL_COMMUNITY)
Admission: AD | Admit: 2012-08-29 | Discharge: 2012-08-29 | Disposition: A | Discharge status: Home / Self Care | Payer: Self-pay | Source: Ambulatory Visit | Attending: Obstetrics & Gynecology | Admitting: Obstetrics & Gynecology

## 2012-08-29 DIAGNOSIS — R55 Syncope and collapse: Secondary | ICD-10-CM

## 2012-08-29 DIAGNOSIS — R42 Dizziness and giddiness: Secondary | ICD-10-CM | POA: Insufficient documentation

## 2012-08-29 DIAGNOSIS — O99891 Other specified diseases and conditions complicating pregnancy: Secondary | ICD-10-CM | POA: Insufficient documentation

## 2012-08-29 DIAGNOSIS — E860 Dehydration: Secondary | ICD-10-CM | POA: Insufficient documentation

## 2012-08-29 DIAGNOSIS — R000 Tachycardia, unspecified: Secondary | ICD-10-CM | POA: Insufficient documentation

## 2012-08-29 DIAGNOSIS — O211 Hyperemesis gravidarum with metabolic disturbance: Secondary | ICD-10-CM | POA: Insufficient documentation

## 2012-08-29 LAB — COMPREHENSIVE METABOLIC PANEL
ALT: 13 U/L (ref 0–35)
AST: 19 U/L (ref 0–37)
Albumin: 3.6 g/dL (ref 3.5–5.2)
CO2: 24 mEq/L (ref 19–32)
Calcium: 9.1 mg/dL (ref 8.4–10.5)
Chloride: 101 mEq/L (ref 96–112)
Creatinine, Ser: 0.59 mg/dL (ref 0.50–1.10)
GFR calc non Af Amer: >90 mL/min (ref >90)
Sodium: 135 mEq/L (ref 135–145)

## 2012-08-29 LAB — CBC
MCH: 30.7 pg (ref 26.0–34.0)
MCV: 85.9 fL (ref 78.0–100.0)
Platelets: 302 10*3/uL (ref 150–400)
RBC: 4.27 MIL/uL (ref 3.87–5.11)
RDW: 12.4 % (ref 11.5–15.5)
WBC: 10.7 10*3/uL — ABNORMAL HIGH (ref 4.0–10.5)

## 2012-08-29 LAB — URINE MICROSCOPIC-ADD ON

## 2012-08-29 LAB — URINALYSIS, ROUTINE W REFLEX MICROSCOPIC
Nitrite: NEGATIVE
Protein, ur: NEGATIVE mg/dL
Specific Gravity, Urine: 1.02 (ref 1.005–1.030)
Urobilinogen, UA: 0.2 mg/dL (ref 0.0–1.0)

## 2012-08-29 LAB — POCT PREGNANCY, URINE: Preg Test, Ur: POSITIVE — AB

## 2012-08-29 MED ORDER — ONDANSETRON HCL 4 MG/2ML IJ SOLN
4.0000 mg | Freq: Once | INTRAMUSCULAR | Status: AC
  Administered 2012-08-29: 4 mg via INTRAVENOUS
  Filled 2012-08-29: qty 2

## 2012-08-29 MED ORDER — DEXTROSE 5 % IN LACTATED RINGERS IV BOLUS
1000.0000 mL | Freq: Once | INTRAVENOUS | Status: AC
  Administered 2012-08-29: 1000 mL via INTRAVENOUS

## 2012-08-29 MED ORDER — ONDANSETRON HCL 4 MG PO TABS: 4.0000 mg | ORAL_TABLET | Freq: Four times a day (QID) | ORAL | Status: DC

## 2012-08-29 MED ORDER — PROMETHAZINE HCL 25 MG PO TABS: 25.0000 mg | ORAL_TABLET | Freq: Four times a day (QID) | ORAL | Status: DC | PRN

## 2012-08-29 NOTE — MAU Provider Note (Signed)
History     CSN: [id number]  Arrival date and time: 08/29/12 1604   First Provider Initiated Contact with Patient 08/29/12 1800      Chief Complaint  Patient presents with  . Nausea  . Tachycardia   HPI Pt is [redacted]w[redacted]d pregnant and presents with episodes of heart racing, feeling dizzy, passed out today and yesterday. Pt has hx of hyperthyroidism with previous pregnancy- went to [facility]. Pt has been nauseated today and has been vomiting all day.  Pt denies cramping, spotting or bleeding.  Pt denies UTI symptoms, constipation or diarrhea.    MAU Note Service date: 08/29/2012 4:19 PM   Severe nausea, heart has been racing. Has passed out twice, once yesterday and once this morning.Marland Kitchen Hx hyperthyroidism     Past Medical History  Diagnosis Date  . Sickle cell trait   . Headache   . Hyperthyroidism   . Sickle cell trait   . Chronic kidney disease   . GERD (gastroesophageal reflux disease)   . Cholecystitis 08/18/2011    Past Surgical History  Procedure Laterality Date  . Dilation and curettage of uterus    . Cholecystectomy  08/19/2011    Procedure: LAPAROSCOPIC CHOLECYSTECTOMY WITH INTRAOPERATIVE CHOLANGIOGRAM;  Surgeon: Lodema Pilot, DO;  Location: [facility] OR;  Service: General;  Laterality: N/A;    Family History  Problem Relation Age of Onset  . Diabetes Mother   . Cancer Father   . Heart disease Father   . Diabetes Maternal Uncle   . Cancer Maternal Grandmother   . Cancer Maternal Grandfather   . Hypertension Maternal Grandfather   . Diabetes Maternal Grandfather   . Sickle cell anemia Son   . Asthma Son   . Heart disease Paternal Grandfather     History  Substance Use Topics  . Smoking status: Never Smoker   . Smokeless tobacco: Never Used  . Alcohol Use: No    Allergies: No Known Allergies  Prescriptions prior to admission  Medication Sig Dispense Refill  . [DISCONTINUED] oxyCODONE-acetaminophen (PERCOCET) 5-325 MG per tablet Take 1 tablet by mouth every 4  (four) hours as needed.        ROS Physical Exam   Blood pressure 141/89, pulse 108, temperature 98.7 F (37.1 C), temperature source Oral, resp. rate 18, height 5' 3.75" (1.619 m), weight 74.39 kg (164 lb), last menstrual period 06/28/2012, SpO2 100.00%.  Physical Exam  Nursing note and vitals reviewed. Constitutional: She is oriented to person, place, and time. She appears well-developed and well-nourished.  HENT:  Head: Normocephalic.  Eyes: Pupils are equal, round, and reactive to light.  Neck: Normal range of motion. Neck supple.  Cardiovascular: Normal rate.   Respiratory: Effort normal.  GI: Soft. She exhibits no distension. There is no tenderness. There is no rebound and no guarding.  Musculoskeletal: Normal range of motion.  Neurological: She is alert and oriented to person, place, and time.  Skin: Skin is warm and dry.  Psychiatric: She has a normal mood and affect.    MAU Course  Procedures Results for orders placed during the hospital encounter of 08/29/12 (from the past 24 hour(s))  URINALYSIS, ROUTINE W REFLEX MICROSCOPIC     Status: Abnormal   Collection Time    08/29/12  4:25 PM      Result Value Range   Color, Urine YELLOW  YELLOW   APPearance HAZY (*) CLEAR   Specific Gravity, Urine 1.020  1.005 - 1.030   pH 6.0  5.0 -  8.0   Glucose, UA NEGATIVE  NEGATIVE mg/dL   Hgb urine dipstick TRACE (*) NEGATIVE   Bilirubin Urine NEGATIVE  NEGATIVE   Ketones, ur 40 (*) NEGATIVE mg/dL   Protein, ur NEGATIVE  NEGATIVE mg/dL   Urobilinogen, UA 0.2  0.0 - 1.0 mg/dL   Nitrite NEGATIVE  NEGATIVE   Leukocytes, UA MODERATE (*) NEGATIVE  URINE MICROSCOPIC-ADD ON     Status: Abnormal   Collection Time    08/29/12  4:25 PM      Result Value Range   Squamous Epithelial / LPF FEW (*) RARE   WBC, UA 21-50  <3 WBC/hpf   Bacteria, UA MANY (*) RARE  POCT PREGNANCY, URINE     Status: Abnormal   Collection Time    08/29/12  4:41 PM      Result Value Range   Preg Test, Ur  POSITIVE (*) NEGATIVE  CBC     Status: Abnormal   Collection Time    08/29/12  6:16 PM      Result Value Range   WBC 10.7 (*) 4.0 - 10.5 K/uL   RBC 4.27  3.87 - 5.11 MIL/uL   Hemoglobin 13.1  12.0 - 15.0 g/dL   HCT 78.2  95.6 - 21.3 %   MCV 85.9  78.0 - 100.0 fL   MCH 30.7  26.0 - 34.0 pg   MCHC 35.7  30.0 - 36.0 g/dL   RDW 08.6  57.8 - 46.9 %   Platelets 302  150 - 400 K/uL  COMPREHENSIVE METABOLIC PANEL     Status: Abnormal   Collection Time    08/29/12  6:16 PM      Result Value Range   Sodium 135  135 - 145 mEq/L   Potassium 3.7  3.5 - 5.1 mEq/L   Chloride 101  96 - 112 mEq/L   CO2 24  19 - 32 mEq/L   Glucose, Bld 90  70 - 99 mg/dL   BUN 5 (*) 6 - 23 mg/dL   Creatinine, Ser 6.29  0.50 - 1.10 mg/dL   Calcium 9.1  8.4 - 52.8 mg/dL   Total Protein 7.2  6.0 - 8.3 g/dL   Albumin 3.6  3.5 - 5.2 g/dL   AST 19  0 - 37 U/L   ALT 13  0 - 35 U/L   Alkaline Phosphatase 55  39 - 117 U/L   Total Bilirubin 0.4  0.3 - 1.2 mg/dL   GFR calc non Af Amer >90  >90 mL/min   GFR calc Af Amer >90  >90 mL/min  TSH pending Pt's nausea relieved with Zofran IV; pt felt better after IV hydration and antiemetic EKG normal sinus rhythm  Care turned over to Joseph Berkshire, PA Assessment and Plan  Hyperemesis- Zofran Syncope episodes- ck TSH F/u in [facility]- message sent to clinic  [facility] 08/29/2012, 6:02 PM   Patient reports significant improvement in symptoms with IV hydration and Zofran  A: Nausea and vomiting in pregnancy prior to [redacted] weeks gestation Dehydration  P: Discharge home Rx for Zofran and Phenergan sent to patient's pharmacy Referral to [facility] sent. They will call her with an appointment to start prenatal care TSH and urine culture pending Pregnancy confirmation letter given Patient may return to MAU as needed or if her condition were to change or worsen  Freddi Starr, PA-C 08/29/2012 8:35 PM

## 2012-08-29 NOTE — MAU Note (Signed)
Severe nausea, heart has been racing.  Has passed out twice, once yesterday and once this morning.Marland Kitchen Hx hyperthyroidism.

## 2012-08-31 LAB — URINE CULTURE: Colony Count: >=100000

## 2012-09-08 ENCOUNTER — Telehealth: Payer: Self-pay

## 2012-09-08 NOTE — Telephone Encounter (Signed)
Message copied by Faythe Casa on Fri Sep 08, 2012  9:16 AM ------      Message from: Allie Bossier      Created: Fri Sep 08, 2012  8:22 AM       She has an E coli UTI and needs a prescription for bactrim ds for 5 days. Thanks. ------

## 2012-09-08 NOTE — Telephone Encounter (Signed)
Called pt and left message stating that she has an antibiotic that was sent to her [facility] pharmacy for a UTI.  If she could go pick it up and take as directed.  If she has any questions to please give [place] a call .

## 2012-09-12 ENCOUNTER — Inpatient Hospital Stay (HOSPITAL_COMMUNITY)
Admission: AD | Admit: 2012-09-12 | Discharge: 2012-09-12 | Disposition: A | Discharge status: Home / Self Care | Payer: Self-pay | Source: Ambulatory Visit | Attending: Obstetrics & Gynecology | Admitting: Obstetrics & Gynecology

## 2012-09-12 ENCOUNTER — Encounter (HOSPITAL_COMMUNITY): Payer: Self-pay | Admitting: *Deleted

## 2012-09-12 DIAGNOSIS — O219 Vomiting of pregnancy, unspecified: Secondary | ICD-10-CM

## 2012-09-12 DIAGNOSIS — O98819 Other maternal infectious and parasitic diseases complicating pregnancy, unspecified trimester: Secondary | ICD-10-CM | POA: Insufficient documentation

## 2012-09-12 DIAGNOSIS — R51 Headache: Secondary | ICD-10-CM | POA: Insufficient documentation

## 2012-09-12 DIAGNOSIS — O210 Mild hyperemesis gravidarum: Secondary | ICD-10-CM | POA: Insufficient documentation

## 2012-09-12 DIAGNOSIS — R109 Unspecified abdominal pain: Secondary | ICD-10-CM | POA: Insufficient documentation

## 2012-09-12 DIAGNOSIS — A5901 Trichomonal vulvovaginitis: Secondary | ICD-10-CM | POA: Insufficient documentation

## 2012-09-12 LAB — URINALYSIS, ROUTINE W REFLEX MICROSCOPIC
Glucose, UA: NEGATIVE mg/dL
pH: 6 (ref 5.0–8.0)

## 2012-09-12 LAB — URINE MICROSCOPIC-ADD ON

## 2012-09-12 MED ORDER — METRONIDAZOLE 500 MG PO TABS
2000.0000 mg | ORAL_TABLET | Freq: Once | ORAL | Status: AC
  Administered 2012-09-12: 2000 mg via ORAL
  Filled 2012-09-12: qty 4

## 2012-09-12 MED ORDER — SODIUM CHLORIDE 0.9 % IV SOLN
INTRAVENOUS | Status: DC
  Administered 2012-09-12 (×2): via INTRAVENOUS

## 2012-09-12 MED ORDER — DIPHENHYDRAMINE HCL 50 MG/ML IJ SOLN
25.0000 mg | Freq: Once | INTRAMUSCULAR | Status: AC
  Administered 2012-09-12: 25 mg via INTRAVENOUS
  Filled 2012-09-12: qty 1

## 2012-09-12 MED ORDER — METOCLOPRAMIDE HCL 5 MG/ML IJ SOLN
10.0000 mg | Freq: Once | INTRAMUSCULAR | Status: AC
  Administered 2012-09-12: 10 mg via INTRAVENOUS
  Filled 2012-09-12: qty 2

## 2012-09-12 MED ORDER — DEXTROSE 5 % IV SOLN
1.0000 g | Freq: Once | INTRAVENOUS | Status: AC
  Administered 2012-09-12: 1 g via INTRAVENOUS
  Filled 2012-09-12: qty 10

## 2012-09-12 MED ORDER — DEXAMETHASONE SODIUM PHOSPHATE 10 MG/ML IJ SOLN
10.0000 mg | Freq: Once | INTRAMUSCULAR | Status: AC
  Administered 2012-09-12: 10 mg via INTRAVENOUS
  Filled 2012-09-12: qty 1

## 2012-09-12 MED ORDER — SODIUM CHLORIDE 0.9 % IV SOLN: INTRAVENOUS | Status: DC

## 2012-09-12 NOTE — MAU Note (Signed)
Pt carrying chips and soda when called into triage. Eating while in lobby

## 2012-09-12 NOTE — MAU Provider Note (Signed)
History     CSN: [id number]  Arrival date and time: 09/12/12 1221   First Provider Initiated Contact with Patient 09/12/12 1311      Chief Complaint  Patient presents with  . Headache   HPI [name] is a 27 y.o. [id number] at [redacted]w[redacted]d who presents to MAU today with complaint of headache and N/V. The patient states headache x 2 days rated at 10/10 now. She has not taken any pain medication. She has consistent N/V throughout the pregnancy. She has phenergan that works some times, but hasn't been able to keep it down recently. She doesn't like to take Zofran because it gives her headaches. She has a history of frequent headaches. She has also had mild lower abdominal cramping off and on. She denies pain today. She has a UTI previously diagnosed that she has been attempting to take antibiotics for, but hasn't been able to keep them down. She denies UTI symptoms, flank or back pain or fever.   OB History   Grav Para Term Preterm Abortions TAB SAB Ect Mult Living   4 2 2  1  1   2       Past Medical History  Diagnosis Date  . Sickle cell trait   . Headache   . Hyperthyroidism   . Sickle cell trait   . Chronic kidney disease   . GERD (gastroesophageal reflux disease)   . Cholecystitis 08/18/2011    Past Surgical History  Procedure Laterality Date  . Dilation and curettage of uterus    . Cholecystectomy  08/19/2011    Procedure: LAPAROSCOPIC CHOLECYSTECTOMY WITH INTRAOPERATIVE CHOLANGIOGRAM;  Surgeon: Lodema Pilot, DO;  Location: [facility] OR;  Service: General;  Laterality: N/A;    Family History  Problem Relation Age of Onset  . Diabetes Mother   . Cancer Father   . Heart disease Father   . Diabetes Maternal Uncle   . Cancer Maternal Grandmother   . Cancer Maternal Grandfather   . Hypertension Maternal Grandfather   . Diabetes Maternal Grandfather   . Sickle cell anemia Son   . Asthma Son   . Heart disease Paternal Grandfather     History  Substance Use Topics  .  Smoking status: Never Smoker   . Smokeless tobacco: Never Used  . Alcohol Use: No    Allergies: No Known Allergies  Prescriptions prior to admission  Medication Sig Dispense Refill  . ondansetron (ZOFRAN) 4 MG tablet Take 1 tablet (4 mg total) by mouth every 6 (six) hours.  12 tablet  0  . promethazine (PHENERGAN) 25 MG tablet Take 1 tablet (25 mg total) by mouth every 6 (six) hours as needed for nausea.  30 tablet  0    Review of Systems  Constitutional: Negative for fever, chills and malaise/fatigue.  Gastrointestinal: Positive for nausea and vomiting. Negative for abdominal pain.  Genitourinary: Negative for dysuria, urgency, frequency and flank pain.       Neg - vaginal bleeding, discharge, LOF  Musculoskeletal: Negative for back pain.  Neurological: Positive for dizziness. Negative for loss of consciousness.   Physical Exam   Blood pressure 129/77, pulse 133, temperature 98.4 F (36.9 C), temperature source Oral, resp. rate 18, height 5' 3.5" (1.613 m), weight 163 lb 12.8 oz (74.299 kg), last menstrual period 06/28/2012.  Physical Exam  Constitutional: She is oriented to person, place, and time. She appears well-developed and well-nourished. No distress.  HENT:  Head: Normocephalic and atraumatic.  Cardiovascular: Normal rate, regular rhythm  and normal heart sounds.   Respiratory: Effort normal and breath sounds normal. No respiratory distress.  GI: Soft. Bowel sounds are normal. She exhibits no distension and no mass. There is no tenderness. There is no rebound and no guarding.  Neurological: She is alert and oriented to person, place, and time.  Skin: Skin is warm and dry. No erythema.  Psychiatric: She has a normal mood and affect.   Results for orders placed during the hospital encounter of 09/12/12 (from the past 24 hour(s))  URINALYSIS, ROUTINE W REFLEX MICROSCOPIC     Status: Abnormal   Collection Time    09/12/12 12:38 PM      Result Value Range   Color, Urine  YELLOW  YELLOW   APPearance CLOUDY (*) CLEAR   Specific Gravity, Urine 1.020  1.005 - 1.030   pH 6.0  5.0 - 8.0   Glucose, UA NEGATIVE  NEGATIVE mg/dL   Hgb urine dipstick SMALL (*) NEGATIVE   Bilirubin Urine NEGATIVE  NEGATIVE   Ketones, ur NEGATIVE  NEGATIVE mg/dL   Protein, ur NEGATIVE  NEGATIVE mg/dL   Urobilinogen, UA 0.2  0.0 - 1.0 mg/dL   Nitrite POSITIVE (*) NEGATIVE   Leukocytes, UA LARGE (*) NEGATIVE  URINE MICROSCOPIC-ADD ON     Status: Abnormal   Collection Time    09/12/12 12:38 PM      Result Value Range   Squamous Epithelial / LPF MANY (*) RARE   WBC, UA TOO NUMEROUS TO COUNT  <3 WBC/hpf   Bacteria, UA MANY (*) RARE   Urine-Other TRICHOMONAS PRESENT      MAU Course  Procedures None  MDM 1 L IV NS with 25 mg benadryl, 10 mg Reglan, 10 mg Decadron for nausea and headache Patient states headache pain is now 1/10. She has been able to tolerate PO intake.  Trichomonas treated with 2 g Flagyl in MAU today. Advised patient of need for partner(s) to be treated appropriately 1 g Rocephin IV given for UTI. Patient to continue PO meds Assessment and Plan  A: Headache Nausea and vomiting in pregnancy prior to [redacted] weeks gestation Trichomonas  P: Discharge home Patient advised that Phenergan tabs can be taken as a suppository if she is unable to keep them down Patient advised to increase PO hydration as tolerated Patient to complete course of Bactrim as prescribed Patient encouraged to follow-up as scheduled to start prenatal care in the [facility] clinic Patient may return to MAU as needed or if her condition were to change or worsen  Freddi Starr, PA-C  09/12/2012, 5:17 PM

## 2012-09-12 NOTE — MAU Note (Signed)
Headache for 2 days.  Usually takes excedrin, but since she is preg- she didn't know what to take.  Has been having some crmapin- has a UTI and can't keep the medicine down.

## 2012-09-14 LAB — URINE CULTURE

## 2012-09-22 ENCOUNTER — Inpatient Hospital Stay (HOSPITAL_COMMUNITY)
Admission: EM | Admit: 2012-09-22 | Discharge: 2012-09-30 | DRG: 781 | Disposition: A | Discharge status: Home / Self Care | Payer: Medicaid Other | Attending: Family Medicine | Admitting: Family Medicine

## 2012-09-22 ENCOUNTER — Emergency Department (HOSPITAL_COMMUNITY): Payer: Medicaid Other

## 2012-09-22 ENCOUNTER — Encounter (HOSPITAL_COMMUNITY): Payer: Self-pay | Admitting: Emergency Medicine

## 2012-09-22 DIAGNOSIS — O98819 Other maternal infectious and parasitic diseases complicating pregnancy, unspecified trimester: Secondary | ICD-10-CM | POA: Diagnosis present

## 2012-09-22 DIAGNOSIS — R079 Chest pain, unspecified: Secondary | ICD-10-CM | POA: Diagnosis present

## 2012-09-22 DIAGNOSIS — O210 Mild hyperemesis gravidarum: Principal | ICD-10-CM | POA: Diagnosis present

## 2012-09-22 DIAGNOSIS — K219 Gastro-esophageal reflux disease without esophagitis: Secondary | ICD-10-CM | POA: Diagnosis present

## 2012-09-22 DIAGNOSIS — A599 Trichomoniasis, unspecified: Secondary | ICD-10-CM | POA: Diagnosis present

## 2012-09-22 DIAGNOSIS — E876 Hypokalemia: Secondary | ICD-10-CM | POA: Diagnosis present

## 2012-09-22 DIAGNOSIS — O0990 Supervision of high risk pregnancy, unspecified, unspecified trimester: Secondary | ICD-10-CM

## 2012-09-22 DIAGNOSIS — E0590 Thyrotoxicosis, unspecified without thyrotoxic crisis or storm: Secondary | ICD-10-CM | POA: Diagnosis present

## 2012-09-22 DIAGNOSIS — O09A0 Supervision of pregnancy with history of molar pregnancy, unspecified trimester: Secondary | ICD-10-CM

## 2012-09-22 DIAGNOSIS — E079 Disorder of thyroid, unspecified: Secondary | ICD-10-CM | POA: Diagnosis present

## 2012-09-22 DIAGNOSIS — I491 Atrial premature depolarization: Secondary | ICD-10-CM | POA: Diagnosis present

## 2012-09-22 DIAGNOSIS — D573 Sickle-cell trait: Secondary | ICD-10-CM | POA: Diagnosis present

## 2012-09-22 DIAGNOSIS — Z87440 Personal history of urinary (tract) infections: Secondary | ICD-10-CM

## 2012-09-22 DIAGNOSIS — O99280 Endocrine, nutritional and metabolic diseases complicating pregnancy, unspecified trimester: Secondary | ICD-10-CM | POA: Diagnosis present

## 2012-09-22 DIAGNOSIS — R000 Tachycardia, unspecified: Secondary | ICD-10-CM | POA: Diagnosis present

## 2012-09-22 DIAGNOSIS — N189 Chronic kidney disease, unspecified: Secondary | ICD-10-CM | POA: Diagnosis present

## 2012-09-22 DIAGNOSIS — O211 Hyperemesis gravidarum with metabolic disturbance: Secondary | ICD-10-CM

## 2012-09-22 DIAGNOSIS — E871 Hypo-osmolality and hyponatremia: Secondary | ICD-10-CM | POA: Diagnosis present

## 2012-09-22 DIAGNOSIS — E878 Other disorders of electrolyte and fluid balance, not elsewhere classified: Secondary | ICD-10-CM | POA: Diagnosis present

## 2012-09-22 LAB — URINALYSIS, ROUTINE W REFLEX MICROSCOPIC
Bilirubin Urine: NEGATIVE
Hgb urine dipstick: NEGATIVE
Specific Gravity, Urine: 1.014 (ref 1.005–1.030)
pH: 5.5 (ref 5.0–8.0)

## 2012-09-22 LAB — URINE MICROSCOPIC-ADD ON

## 2012-09-22 MED ORDER — SODIUM CHLORIDE 0.9 % IV BOLUS (SEPSIS): 1000.0000 mL | Freq: Once | INTRAVENOUS | Status: DC

## 2012-09-22 MED ORDER — ONDANSETRON HCL 4 MG/2ML IJ SOLN
4.0000 mg | Freq: Once | INTRAMUSCULAR | Status: AC
  Administered 2012-09-22: 4 mg via INTRAVENOUS
  Filled 2012-09-22: qty 2

## 2012-09-22 MED ORDER — DEXTROSE 5 % IV BOLUS
500.0000 mL | Freq: Once | INTRAVENOUS | Status: AC
  Administered 2012-09-22: 500 mL via INTRAVENOUS

## 2012-09-22 MED ORDER — SODIUM CHLORIDE 0.9 % IV SOLN: 1000.0000 mL | Freq: Once | INTRAVENOUS | Status: DC

## 2012-09-22 MED ORDER — SODIUM CHLORIDE 0.9 % IV SOLN
1000.0000 mL | Freq: Once | INTRAVENOUS | Status: AC
  Administered 2012-09-22: 1000 mL via INTRAVENOUS

## 2012-09-22 MED ORDER — METOCLOPRAMIDE HCL 5 MG/ML IJ SOLN
10.0000 mg | Freq: Once | INTRAMUSCULAR | Status: AC
  Administered 2012-09-22: 10 mg via INTRAVENOUS
  Filled 2012-09-22: qty 2

## 2012-09-22 MED ORDER — SODIUM CHLORIDE 0.9 % IV SOLN: 1000.0000 mL | INTRAVENOUS | Status: DC

## 2012-09-22 NOTE — ED Provider Notes (Signed)
History     CSN: [id number]  Arrival date & time 09/22/12  2001   First MD Initiated Contact with Patient 09/22/12 2042      Chief Complaint  Patient presents with  . Nausea  . Weakness    (Consider location/radiation/quality/duration/timing/severity/associated sxs/prior treatment) The history is provided by the patient and medical records. No language interpreter was used.    Grace [name] is a 27 y.o. female  with a hx of hyperemesis gravidarum, sickle cell trait, hyperthyroid, chronic kidney disease, GERD, cholecystectomy, [id number] presents to the Emergency Department complaining of gradual, persistent, progressively worsening nausea and vomiting worse in the last 4 weeks.  Patient has been seen at high-risk clinic in the past but not followed for this pregnancy yet. Patient has an appointment on 10/03/2012 for her first prenatal visit. Associated symptoms include mild weight loss, decreased urination, fatigue and general weakness.  Nothing makes it better as she has tried Zofran and Phenergan tablets and nothing makes it worse.  Pt denies fever, chills, headache, neck pain, chest pain, service of breath, abdominal pain, diarrhea, CP, dysuria.     Past Medical History  Diagnosis Date  . Sickle cell trait   . Headache(784.0)   . Hyperthyroidism   . Sickle cell trait   . Chronic kidney disease     recurrent UTIs  . GERD (gastroesophageal reflux disease)   . Cholecystitis 08/18/2011    Past Surgical History  Procedure Laterality Date  . Dilation and curettage of uterus    . Cholecystectomy  08/19/2011    Procedure: LAPAROSCOPIC CHOLECYSTECTOMY WITH INTRAOPERATIVE CHOLANGIOGRAM;  Surgeon: Lodema Pilot, DO;  Location: [facility] OR;  Service: General;  Laterality: N/A;    Family History  Problem Relation Age of Onset  . Diabetes Mother   . Cancer Father   . Heart disease Father   . Diabetes Maternal Uncle   . Cancer Maternal Grandmother   . Cancer Maternal Grandfather   .  Hypertension Maternal Grandfather   . Diabetes Maternal Grandfather   . Sickle cell anemia Son   . Asthma Son   . Heart disease Paternal Grandfather     History  Substance Use Topics  . Smoking status: Never Smoker   . Smokeless tobacco: Never Used  . Alcohol Use: No    OB History   Grav Para Term Preterm Abortions TAB SAB Ect Mult Living   4 2 2  1  1   2       Review of Systems  Constitutional: Positive for fatigue. Negative for fever, diaphoresis, appetite change and unexpected weight change.  HENT: Negative for mouth sores and neck stiffness.   Eyes: Negative for visual disturbance.  Respiratory: Negative for cough, chest tightness, shortness of breath and wheezing.   Cardiovascular: Negative for chest pain.  Gastrointestinal: Positive for nausea and vomiting. Negative for abdominal pain, diarrhea and constipation.  Endocrine: Negative for polydipsia, polyphagia and polyuria.  Genitourinary: Positive for decreased urine volume. Negative for dysuria, urgency, frequency, hematuria, vaginal bleeding, vaginal discharge, vaginal pain and pelvic pain.  Musculoskeletal: Negative for back pain.  Skin: Negative for rash.  Allergic/Immunologic: Negative for immunocompromised state.  Neurological: Positive for weakness. Negative for syncope, light-headedness and headaches.  Hematological: Does not bruise/bleed easily.  Psychiatric/Behavioral: Negative for sleep disturbance. The patient is not nervous/anxious.     Allergies  Review of patient's allergies indicates no known allergies.  Home Medications   Current Outpatient Rx  Name  Route  Sig  Dispense  Refill  .  ondansetron (ZOFRAN) 4 MG tablet   Oral   Take 1 tablet (4 mg total) by mouth every 6 (six) hours.   12 tablet   0   . promethazine (PHENERGAN) 25 MG tablet   Oral   Take 1 tablet (25 mg total) by mouth every 6 (six) hours as needed for nausea.   30 tablet   0     BP 108/75  Pulse 125  Temp(Src) 99.5 F  (37.5 C) (Oral)  Resp 22  SpO2 98%  LMP 06/28/2012  Physical Exam  Nursing note and vitals reviewed. Constitutional: She is oriented to person, place, and time. She appears well-developed and well-nourished. No distress.  Patient spitting throughout exam, will not swallow secretions stating that it makes her sick to her stomach  HENT:  Head: Normocephalic and atraumatic.  Mouth/Throat: Oropharynx is clear and moist. No oropharyngeal exudate.  Eyes: Conjunctivae are normal. Pupils are equal, round, and reactive to light. No scleral icterus.  Neck: Normal range of motion. Neck supple.  Cardiovascular: Regular rhythm, S1 normal, S2 normal, normal heart sounds and intact distal pulses.  Tachycardia present.   Pulses:      Radial pulses are 2+ on the right side, and 2+ on the left side.       Dorsalis pedis pulses are 2+ on the right side, and 2+ on the left side.       Posterior tibial pulses are 2+ on the right side, and 2+ on the left side.  Pulmonary/Chest: Effort normal and breath sounds normal. No accessory muscle usage. Not tachypneic. No respiratory distress. She has no decreased breath sounds. She has no wheezes. She has no rhonchi. She has no rales.  Abdominal: Soft. Bowel sounds are normal. She exhibits no distension and no mass. There is no tenderness. There is no rebound and no guarding.  Abdomen soft and nontender  Musculoskeletal: Normal range of motion. She exhibits no edema and no tenderness.  Lymphadenopathy:    She has no cervical adenopathy.  Neurological: She is alert and oriented to person, place, and time. She exhibits normal muscle tone. Coordination normal.  Speech is clear and goal oriented Moves extremities without ataxia  Skin: Skin is warm and dry. No rash noted. She is not diaphoretic. No erythema.  Psychiatric: She has a normal mood and affect. Her behavior is normal.    ED Course  Procedures (including critical care time)  Labs Reviewed  CBC - Abnormal;  Notable for the following:    HCT 32.9 (*)    MCHC 37.1 (*)    All other components within normal limits  HCG, QUANTITATIVE, PREGNANCY - Abnormal; Notable for the following:    hCG, Beta Francene Finders [id number] (*)    All other components within normal limits  URINALYSIS, ROUTINE W REFLEX MICROSCOPIC - Abnormal; Notable for the following:    APPearance CLOUDY (*)    Ketones, ur >80 (*)    Leukocytes, UA LARGE (*)    All other components within normal limits  BASIC METABOLIC PANEL - Abnormal; Notable for the following:    Sodium 128 (*)    Potassium 2.6 (*)    Chloride 95 (*)    Glucose, Bld 148 (*)    BUN 3 (*)    Creatinine, Ser 0.47 (*)    All other components within normal limits  URINE MICROSCOPIC-ADD ON - Abnormal; Notable for the following:    Bacteria, UA FEW (*)    Casts HYALINE CASTS (*)  All other components within normal limits  URINE CULTURE  GC/CHLAMYDIA PROBE AMP  WET PREP, GENITAL  ABO/RH   US Ob Comp Less 14 Wks  09/22/2012   *RADIOLOGY REPORT*  Clinical Data: 27 year old pregnant female with pelvic pain.  OBSTETRIC <14 WK ULTRASOUND  Technique:  Transabdominal ultrasound was performed for evaluation of the gestation as well as the maternal uterus and adnexal regions.  Comparison:  None  Intrauterine gestational sac: Visualized/normal in shape. Yolk sac: Not visualized Embryo: Visualized Cardiac Activity: Present Heart Rate: 160 bpm  CRL:  57 mm  12 w  2 d        [place] EDC: 04/04/2013  Maternal uterus/Adnexae: There is no evidence of subchorionic hemorrhage. The ovaries bilaterally are not visualized. There is no evidence of free fluid or visualized adnexal mass.  IMPRESSION: Single living intrauterine gestation with estimated gestational age of [redacted] weeks 2 days by this ultrasound.  No evidence of subchorionic hemorrhage.  Ovaries not visualized.   Original Report Authenticated By: Harmon Pier, M.D.    ECG:  Date: 09/23/2012  Rate: 121  Rhythm: sinus tachycardia  QRS  Axis: normal  Intervals: normal  ST/T Wave abnormalities: normal  Conduction Disutrbances:none  Narrative Interpretation: nonischemic ECG, previous; tachycardic in comparison to 08/29/12  Old EKG Reviewed: changes noted   1. Hyperemesis gravidarum before end of [redacted] week gestation, dehydration   2. Hypokalemia       MDM  Grace [name] presents with pregnancy, nausea, vomiting and history of hyperthyroid along with hyperemesis gravidarum.  Patient states symptoms today are the same as they have been in the past. She has not had any prenatal care up to this point.  BMP with hyponatremia, hypokalemia, hypochloremia and a glucose of 148.  CBC unremarkable and UA with keytones and WBC TNTC.  Culture report from previous UTI shows e.coli.    Discussed with OB/GYN who recommends K+ replacement and admission only if she remains unable to tolerate PO. Pt is acceptable for admission at [facility] as she is < 24 weeks.    Discussed with family practice who will evaluate, perform a pelvic exam and admit for hyperemesis and hypokalemia.            Grace Client Braylyn Eye, PA-C 09/23/12 0126

## 2012-09-22 NOTE — ED Notes (Signed)
Rn called into room and informed by pt that she had thrown up.  Green emesis noted by bedside. EDPA made aware.

## 2012-09-22 NOTE — ED Notes (Addendum)
PT. REPORTS GENERALIZED WEAKNESS WITH NAUSEA FOR SEVERAL DAYS UNRELIEVED BY PRESCRIPTION ZOFRAN AND PHENERGAN , PT. STATED SHE IS [redacted] WEEKS PREGNANT , LMP- 06/28/2012 , NO PRENATAL VISITS , G4P2 .

## 2012-09-23 ENCOUNTER — Encounter (HOSPITAL_COMMUNITY): Payer: Self-pay | Admitting: Sports Medicine

## 2012-09-23 DIAGNOSIS — O211 Hyperemesis gravidarum with metabolic disturbance: Secondary | ICD-10-CM | POA: Diagnosis present

## 2012-09-23 DIAGNOSIS — A599 Trichomoniasis, unspecified: Secondary | ICD-10-CM | POA: Clinically undetermined

## 2012-09-23 LAB — BASIC METABOLIC PANEL
BUN: 3 mg/dL — ABNORMAL LOW (ref 6–23)
BUN: <3 mg/dL — ABNORMAL LOW (ref 6–23)
CO2: 20 mEq/L (ref 19–32)
CO2: 21 mEq/L (ref 19–32)
Calcium: 8.5 mg/dL (ref 8.4–10.5)
Chloride: 95 mEq/L — ABNORMAL LOW (ref 96–112)
Chloride: 109 mEq/L (ref 96–112)
Creatinine, Ser: 0.42 mg/dL — ABNORMAL LOW (ref 0.50–1.10)
Creatinine, Ser: 0.37 mg/dL — ABNORMAL LOW (ref 0.50–1.10)
GFR calc Af Amer: >90 mL/min (ref >90)
GFR calc Af Amer: >90 mL/min (ref >90)
Glucose, Bld: 148 mg/dL — ABNORMAL HIGH (ref 70–99)
Glucose, Bld: 100 mg/dL — ABNORMAL HIGH (ref 70–99)
Glucose, Bld: 127 mg/dL — ABNORMAL HIGH (ref 70–99)
Potassium: 2.6 mEq/L — CL (ref 3.5–5.1)
Potassium: 3 mEq/L — ABNORMAL LOW (ref 3.5–5.1)

## 2012-09-23 LAB — CBC
HCT: 32.9 % — ABNORMAL LOW (ref 36.0–46.0)
Hemoglobin: 12.2 g/dL (ref 12.0–15.0)
MCH: 30.9 pg (ref 26.0–34.0)
MCHC: 37.1 g/dL — ABNORMAL HIGH (ref 30.0–36.0)
MCV: 83.3 fL (ref 78.0–100.0)

## 2012-09-23 LAB — WET PREP, GENITAL: Yeast Wet Prep HPF POC: NONE SEEN

## 2012-09-23 MED ORDER — POTASSIUM CHLORIDE 10 MEQ/100ML IV SOLN
10.0000 meq | INTRAVENOUS | Status: AC
  Administered 2012-09-23 (×4): 10 meq via INTRAVENOUS
  Filled 2012-09-23 (×4): qty 100

## 2012-09-23 MED ORDER — POTASSIUM CHLORIDE 10 MEQ/100ML IV SOLN
10.0000 meq | Freq: Once | INTRAVENOUS | Status: AC
  Administered 2012-09-23: 10 meq via INTRAVENOUS
  Filled 2012-09-23: qty 200

## 2012-09-23 MED ORDER — DEXTROSE-NACL 5-0.9 % IV SOLN: INTRAVENOUS | Status: DC

## 2012-09-23 MED ORDER — ZOLPIDEM TARTRATE 5 MG PO TABS: 5.0000 mg | ORAL_TABLET | Freq: Every evening | ORAL | Status: DC | PRN

## 2012-09-23 MED ORDER — PRENATAL MULTIVITAMIN CH
1.0000 | ORAL_TABLET | Freq: Every day | ORAL | Status: DC
  Administered 2012-09-23 – 2012-09-30 (×8): 1 via ORAL
  Filled 2012-09-23 (×10): qty 1

## 2012-09-23 MED ORDER — POTASSIUM CHLORIDE 10 MEQ/100ML IV SOLN
10.0000 meq | Freq: Once | INTRAVENOUS | Status: AC
  Administered 2012-09-23: 10 meq via INTRAVENOUS

## 2012-09-23 MED ORDER — POTASSIUM CHLORIDE CRYS ER 20 MEQ PO TBCR
40.0000 meq | EXTENDED_RELEASE_TABLET | Freq: Two times a day (BID) | ORAL | Status: AC
  Administered 2012-09-23 – 2012-09-24 (×2): 40 meq via ORAL
  Filled 2012-09-23 (×2): qty 2

## 2012-09-23 MED ORDER — METRONIDAZOLE IVPB CUSTOM
2.0000 g | Freq: Once | INTRAVENOUS | Status: AC
  Administered 2012-09-23: 2 g via INTRAVENOUS
  Filled 2012-09-23: qty 400

## 2012-09-23 MED ORDER — ONDANSETRON 8 MG/NS 50 ML IVPB
8.0000 mg | Freq: Four times a day (QID) | INTRAVENOUS | Status: DC | PRN
  Filled 2012-09-23: qty 8

## 2012-09-23 MED ORDER — MAGNESIUM SULFATE 40 MG/ML IJ SOLN
2.0000 g | Freq: Once | INTRAMUSCULAR | Status: AC
  Administered 2012-09-23: 2 g via INTRAVENOUS
  Filled 2012-09-23: qty 50

## 2012-09-23 MED ORDER — HEPARIN SODIUM (PORCINE) 5000 UNIT/ML IJ SOLN
5000.0000 [IU] | Freq: Three times a day (TID) | INTRAMUSCULAR | Status: DC
  Administered 2012-09-23 – 2012-09-30 (×20): 5000 [IU] via SUBCUTANEOUS
  Filled 2012-09-23 (×25): qty 1

## 2012-09-23 MED ORDER — SODIUM CHLORIDE 0.9 % IV BOLUS (SEPSIS)
1000.0000 mL | Freq: Once | INTRAVENOUS | Status: AC
  Administered 2012-09-23: 1000 mL via INTRAVENOUS

## 2012-09-23 MED ORDER — PROMETHAZINE HCL 25 MG/ML IJ SOLN
25.0000 mg | Freq: Four times a day (QID) | INTRAMUSCULAR | Status: DC | PRN
  Administered 2012-09-23 – 2012-09-26 (×12): 25 mg via INTRAVENOUS
  Filled 2012-09-23 (×13): qty 1

## 2012-09-23 MED ORDER — KCL-LACTATED RINGERS-D5W 20 MEQ/L IV SOLN
INTRAVENOUS | Status: DC
  Administered 2012-09-23 – 2012-09-24 (×5): via INTRAVENOUS
  Administered 2012-09-25: 150 mL/h via INTRAVENOUS
  Administered 2012-09-25 (×2): via INTRAVENOUS
  Administered 2012-09-25: 150 mL/h via INTRAVENOUS
  Filled 2012-09-23 (×15): qty 1000

## 2012-09-23 MED ORDER — POTASSIUM CHLORIDE 20 MEQ/15ML (10%) PO LIQD: 40.0000 meq | Freq: Once | ORAL | Status: DC

## 2012-09-23 MED ORDER — ACETAMINOPHEN 325 MG PO TABS
650.0000 mg | ORAL_TABLET | ORAL | Status: DC | PRN
  Administered 2012-09-23 – 2012-09-26 (×2): 650 mg via ORAL
  Filled 2012-09-23 (×2): qty 2

## 2012-09-23 MED ORDER — POTASSIUM CHLORIDE 2 MEQ/ML IV SOLN
INTRAVENOUS | Status: AC
  Administered 2012-09-23: 04:00:00 via INTRAVENOUS
  Filled 2012-09-23: qty 1000

## 2012-09-23 MED ORDER — CALCIUM CARBONATE ANTACID 500 MG PO CHEW
2.0000 | CHEWABLE_TABLET | ORAL | Status: DC | PRN
  Administered 2012-09-25: 400 mg via ORAL
  Filled 2012-09-23 (×4): qty 2

## 2012-09-23 MED ORDER — DOCUSATE SODIUM 100 MG PO CAPS
100.0000 mg | ORAL_CAPSULE | Freq: Every day | ORAL | Status: DC
  Administered 2012-09-23 – 2012-09-30 (×8): 100 mg via ORAL
  Filled 2012-09-23 (×9): qty 1

## 2012-09-23 MED ORDER — ONDANSETRON HCL 4 MG/2ML IJ SOLN
4.0000 mg | Freq: Once | INTRAMUSCULAR | Status: AC
  Administered 2012-09-23: 4 mg via INTRAVENOUS
  Filled 2012-09-23: qty 2

## 2012-09-23 NOTE — Progress Notes (Signed)
Patient arrived to the floor via wheelchair from the ED at approximately [phone].  She is A&O x 4.  No complaints of nausea or pain at this time.  She was introduced to the unit.  Ambulates with standby assistance.  Patient information booklet given to patient.  No skin issues noted.  Lorelle Macaluso, Justine Null

## 2012-09-23 NOTE — H&P (Signed)
Family Medicine Teaching Service Admission H&P Service Pager: [phone]  Patient name: Grace [name] Medical record number: [id number] Date of birth: 01-05-1986 Age: 27 y.o. Gender: female  Primary Care Provider: [facility]ICAL [facility], MD Attending Physician: Derwood Kaplan, MD  BRIEF PATIENT OVERVIEW & DISPOSITION  28 y.o. year old female with hyperemesis gravidarum.  History of hyperthyroidism, molar pregnancy, prior hyperemesis.  Normal IUP at this time.  Has not established with high risk clinic yet but has appointment later in the month.  Active Hospital Problems   Diagnosis Date Noted  . Hyperemesis gravidarum before end of [redacted] week gestation with electrolyte imbalance 09/23/2012  . Trichomonas 09/23/2012  . Supervision of high-risk pregnancy 04/19/2011  . History of molar pregnancy, antepartum 03/22/2011  . Hyperthyroidism, antepartum 03/16/2011  . Sickle cell trait 12/30/2010    Resolved Hospital Problems   Diagnosis Date Noted Date Resolved  No resolved problems to display.   CC  Nausea Vomiting  HPI  Grace [name] is a 27 y.o. [id number] [redacted]w[redacted]d by LMP female presenting with history of worsening Nausea and vomiting over the past week.  She has trouble with nausea and vomiting during this pregnancy thus far and has been seen at the MAU multiple times.  She reports that she has been vomiting over the past week but today she started becoming extremely fatigued and weak and that was what prompted her to be evaluated in the emergency department.  She denies any fevers, chills, abdominal pain, vaginal bleeding, vaginal discharge.  Reports worsening symptoms inspite of OP zofran and phenergan.    her past medical history is pertinent for: Molar pregnancy and hyperemesis in pregnancy.  She does have a history of hyperthyroid requiring monitoring only per the patient.  She denies any palpitations or chest pain at this time. OB History   Grav Para Term Preterm Abortions TAB SAB Ect Mult  Living   4 2 2  1  1   2      # Outc Date GA Lbr Len/2nd Wgt Sex Del Anes PTL Lv   1 SAB 9/04 [redacted]w[redacted]d          Comments: Molar Pregnancy   2 TRM 4/07 [redacted]w[redacted]d 03:00 6lb11oz(3.033kg) M SVD EPI No Yes   Comments: baby has sickle cell disease. hyperemesis   3 TRM 1/13 106w4d 896:52 / 01:37 5lb3.3oz(2.36kg) M SVD EPI  Yes   Comments: WNL   4 CUR              ROS   Constitutional Fatigue, malaise, no dizziness or pre/syncope  Infectious No fevers, no chills, occasionally sweaty with vomiting  Resp No cough, no congestion  Cardiac No chest pain, no palpitations  GI Non-bloody, non-bilious emesis.  Normal bowel movements, no diarrhea, no   GU No dysuria, no frequency, has recently been treated with Keflex for E.Coli UTI  Skin No rash, no puritis  Diet Decreased PO  MEDS  attempting Zofran and Phenergan at home still with persistent vomiting      HISTORY  PMHx:  Past Medical History  Diagnosis Date  . Sickle cell trait   . Headache(784.0)   . Hyperthyroidism   . Sickle cell trait   . Chronic kidney disease     recurrent UTIs  . GERD (gastroesophageal reflux disease)   . Cholecystitis 08/18/2011    PSHx: Past Surgical History  Procedure Laterality Date  . Dilation and curettage of uterus    . Cholecystectomy  08/19/2011    Procedure: LAPAROSCOPIC CHOLECYSTECTOMY  WITH INTRAOPERATIVE CHOLANGIOGRAM;  Surgeon: Lodema Pilot, DO;  Location: [facility] OR;  Service: General;  Laterality: N/A;    Social Hx: History   Social History  . Marital Status: Single    Spouse Name: N/A    Number of Children: N/A  . Years of Education: N/A   Social History Main Topics  . Smoking status: Never Smoker   . Smokeless tobacco: Never Used  . Alcohol Use: No  . Drug Use: No  . Sexually Active: Yes   Other Topics Concern  . None   Social History Narrative   2 Adults, 4 children at home   FOB involved    Family Hx: Family History  Problem Relation Age of Onset  . Diabetes Mother   . Cancer  Father   . Heart disease Father   . Diabetes Maternal Uncle   . Cancer Maternal Grandmother   . Cancer Maternal Grandfather   . Hypertension Maternal Grandfather   . Diabetes Maternal Grandfather   . Sickle cell anemia Son   . Asthma Son   . Heart disease Paternal Grandfather     Allergies: No Known Allergies  Home Medications:  (Not in a hospital admission)  OBJECTIVE  Vitals: Temp:  [99.1 F (37.3 C)-99.5 F (37.5 C)] 99.5 F (37.5 C) (06/07 0009) Pulse Rate:  [100-135] 100 (06/07 0600) Resp:  [12-23] 23 (06/07 0230) BP: (102-160)/(60-93) 109/68 mmHg (06/07 0600) SpO2:  [97 %-99 %] 99 % (06/07 0600)  Weight: Baseline Weight: 170 - in Oct 2013   I&Os: No intake or output data in the 24 hours ending 09/23/12 0616  PE: GENERAL:  Adult AA  female. Mild/moderate discomfort; no respiratory distress. PSYCH: Alert and appropriately interactive; Insight:Good   H&N: AT/Casa, trachea midline EENT:  dry, no scleral icterus, EOMi HEART: RRR, S1/S2 heard, no murmur LUNGS: CTA B, no wheezes, no crackles ABDOMEN: +BS, soft, non-tender, no rigidity, no guarding, no masses/organomegaly EXTREMITIES: Moves all 4 extremities spontaneously, warm well perfused, no edema, bilateral DP and PT pulses 2/4.   PELVIC: VULVA: normal appearing vulva with no masses, tenderness or lesions, VAGINA: vaginal discharge - white, copious and frothy, CERVIX: lesions absent, cervical discharge present - white, copious and frothy, DNA probe for chlamydia and GC obtained, cervical motion tenderness absent, multiparous os, UTERUS: uterus is normal size, shape, consistency and nontender, appropriate for gestational age, ADNEXA: normal adnexa in size, nontender and no masses, exam chaperoned by ED nursing.  Cervix closed, no blood  LABS: Daily Others:   Recent Labs Lab 09/22/12 2115  WBC 10.2  HGB 12.2  HCT 32.9*  PLT 294    Recent Labs Lab 09/22/12 2233 09/23/12 0440  NA 128* 135  K 2.6* 2.5*  CL  95* 102  CO2 20 20  BUN 3* <3*  CREATININE 0.47* 0.42*  GLUCOSE 148* 100*  CALCIUM 9.1 8.5      URINE STUDIES: 6/6 - UA - Ketones 80; Large Leukocytes, neg Nitrites; mucous; TNTC WBCS  MICRO: 6/6 - Urine Cx >>> 6/6 - Wet Prep: Trichomonas  IMAGING: 09/22/12 - IUP @ [redacted]w[redacted]d.  Medications:   . dextrose 5 % lactated ringers with kcl 200 mL/hr at 09/23/12 0413  . metronidazole 2 g (09/23/12 0536)   . docusate sodium  100 mg Oral Daily  . heparin  5,000 Units Subcutaneous Q8H  . prenatal multivitamin  1 tablet Oral Q1200   acetaminophen, calcium carbonate, promethazine, zolpidem  Assessment & Plan  LOS: 1 Pt is a 26  y.o. [id number] [redacted]w[redacted]d by LMP with hyperemesis gravidarum.  # Hyperemesis Gravidarium: failed 1 week of PO challenged with Phenergan and Zofran.  Not improved in ED, still with significant nausea.  Ketotic at presentation.   Continue IVFs + Dextrose + K  When can take PO will encourage small sips.   [ ]  Start B6 when tolerating although not proven to help vomiting, only nausea   # Hypokalemia: Repleating in Fluids  Recheck in AM  # Hyperthyroidism: Recently checked at 0.097.  Will need follow up with High Risk Clinic but likely related to Hyperemesis Gravidarium  Continue to monitor  - consider T4/free T3  - pt's last pregnancy was Molar Pregnancy.  No evidence on [place].  Beta-HCG WNL for 12 week pregnancy  # ?UTI: Unclear.  Given nausea/vomiting and recently ?failed UTI will treat with Rocephin 1g q12 hours.  Will have defacto treatment for Chlamydia but will obtain GC/Chlamydia + Wet prep.    [ ]  may need prophylactic treatment if recurrent urine Cx is positive for UTI   >> Was Trichmonas, tx with Flagyl 2g IV   [ ]  discuss partner needs treatment as this may be recurrent >> STI education  [ ]  f/u GC/Chlam as high association with multiple morphotypes  # Routine Pregnancy Care: Will need daily fetal HR by doppler. [ ]  PNV at d/c   --- FEN  *D5 LR +  KCL -NPO --- PPx: Heparin   Andrena Mews, DO [facility] Resident - PGY-2 09/23/2012 6:16 AM

## 2012-09-23 NOTE — Progress Notes (Signed)
Interim progress note S: She reports improvement in vomiting, but still very nauseated.  Trying sips of water with limited success.  O:  BP 111/60  Pulse 103  Temp(Src) 98.5 F (36.9 C) (Oral)  Resp 17  Ht 5\' 4"  (1.626 m)  Wt 160 lb 4.4 oz (72.7 kg)  BMI 27.5 kg/m2  SpO2 98%  LMP 06/28/2012 Gen: alert, cooperative, NAD FHT: unable to obtain via doppler; on ultrasound + cardiac activity with rate approximately 160  A/P: 27 yo G4P2012 at [redacted]w[redacted]d by LMP with hyperemesis gravidarium - continue IV fluids and anti-emetics - repleting K - will recheck BMP and magnesium, T3, T4 levels this afternoon.  BOOTH, ERIN 09/23/2012, 3:19 PM

## 2012-09-23 NOTE — Progress Notes (Signed)
INITIAL NUTRITION ASSESSMENT  DOCUMENTATION CODES Per approved criteria  -Not Applicable   INTERVENTION: 1.  Modify diet; consider continue clear liquids/slow introduction of solid food over next 24 hrs.  If tolerated with anti-emetic therapy, recommend advancement to bland diet goal. 2.  Nutrition-related medications; prenatal vitamin with additional thiamine as inpatient if appropriate per MD.  Continue with anti-emetic therapy per MD discretion for improved diet tolerance.  3.  Supplements; will continue to monitor for need of PO supplements. Pt not appropriate for supplements at this time due to poor diet tolerance.  4.  RD to follow for nutrition-related care plan.  NUTRITION DIAGNOSIS: Inadequate oral intake related to hyperemesis as evidenced by pt report, wt loss.   Monitor:  1.  Food/Beverage; slow advancement of diet to [facility] goal with pt meeting >/=90% estimated needs. 2.  Wt/wt change; resume pregnancy wt goals of 0.5-1 lbs/wk during second and third trimester.  Reason for Assessment: MST  27 y.o. female  Admitting Dx: Hyperemesis gravidarum before end of [redacted] week gestation with electrolyte imbalance  ASSESSMENT: Pt is [redacted] wks pregnant, admitted for hyperemesis.  Pt with vomiting overnight and limited tolerance of water today. Pt reports 164 lbs as per pregnancy wt. Pt is not meeting standard wt gain goals for pregnancy, however reports that she typically gains </=10 lbs with her previous pregnancies. She has been on combination phenergan and zofran therapy x 4 weeks.  Pt ready to trial solid foods.  Encouraged to continue with current diet order until reassessed for readiness by MD.   Height: Ht Readings from Last 1 Encounters:  09/23/12 5\' 4"  (1.626 m)    Weight: Wt Readings from Last 1 Encounters:  09/23/12 160 lb 4.4 oz (72.7 kg)    Ideal Body Weight: 120 lbs  % Ideal Body Weight: 133%  Wt Readings from Last 10 Encounters:  09/23/12 160 lb 4.4 oz (72.7 kg)   09/12/12 163 lb 12.8 oz (74.299 kg)  08/29/12 164 lb (74.39 kg)  02/04/12 170 lb (77.111 kg)  11/17/11 163 lb 6.4 oz (74.118 kg)  08/18/11 158 lb 11.7 oz (72 kg)  08/18/11 158 lb 11.7 oz (72 kg)  06/16/11 152 lb 6.4 oz (69.128 kg)  05/10/11 156 lb 12.8 oz (71.124 kg)  05/03/11 158 lb 6.4 oz (71.85 kg)    Usual Body Weight: 160-170 lbs  % Usual Body Weight: 100%  BMI:  Body mass index is 27.5 kg/(m^2).  Estimated Nutritional Needs: Kcal: 1750-1920 Protein: 79-87g Fluid: >2.0 L/day  Skin: intact  Diet Order: Clear Liquid  EDUCATION NEEDS: -Education not appropriate at this time   Intake/Output Summary (Last 24 hours) at 09/23/12 1532 Last data filed at 09/23/12 0635  Gross per 24 hour  Intake      0 ml  Output      0 ml  Net      0 ml    Last BM: PTA  Labs:   Recent Labs Lab 09/22/12 2233 09/23/12 0440  NA 128* 135  K 2.6* 2.5*  CL 95* 102  CO2 20 20  BUN 3* <3*  CREATININE 0.47* 0.42*  CALCIUM 9.1 8.5  GLUCOSE 148* 100*    CBG (last 3)  No results found for this basename: GLUCAP,  in the last 72 hours  Scheduled Meds: . docusate sodium  100 mg Oral Daily  . heparin  5,000 Units Subcutaneous Q8H  . potassium chloride  10 mEq Intravenous Q1 Hr x 4  . prenatal multivitamin  1 tablet Oral Q1200    Continuous Infusions: . dextrose 5% lactated ringers with KCl 20 mEq/L 150 mL/hr at 09/23/12 1501    Past Medical History  Diagnosis Date  . Sickle cell trait   . Headache(784.0)   . Hyperthyroidism   . Sickle cell trait   . Chronic kidney disease     recurrent UTIs  . GERD (gastroesophageal reflux disease)   . Cholecystitis 08/18/2011    Past Surgical History  Procedure Laterality Date  . Dilation and curettage of uterus    . Cholecystectomy  08/19/2011    Procedure: LAPAROSCOPIC CHOLECYSTECTOMY WITH INTRAOPERATIVE CHOLANGIOGRAM;  Surgeon: Lodema Pilot, DO;  Location: [facility] OR;  Service: General;  Laterality: N/A;    Loyce Dys, MS RD  LDN Clinical Inpatient Dietitian Pager: [phone] Weekend/After hours pager: [phone]

## 2012-09-23 NOTE — ED Notes (Signed)
Critical Potassium 2.6. Primary RN notified. MD notified.

## 2012-09-23 NOTE — ED Notes (Signed)
MD at bedside. 

## 2012-09-23 NOTE — H&P (Signed)
Family Medicine Teaching Service Attending Note  I interviewed and examined patient Grace [name] and reviewed their tests and x-rays.  I discussed with Dr. Berline Chough and reviewed their note for today.  I agree with their assessment and plan.     Additionally  Feeling some improved No vomiting since admission Follow up on thyroid studies and consult/notify High risk ob with results

## 2012-09-23 NOTE — Progress Notes (Addendum)
Pt tolerating liquid diet well. No vomiting, just nausea. Pt asking to eat crackers.

## 2012-09-24 LAB — BASIC METABOLIC PANEL
CO2: 23 mEq/L (ref 19–32)
Calcium: 8.1 mg/dL — ABNORMAL LOW (ref 8.4–10.5)
Potassium: 3.1 mEq/L — ABNORMAL LOW (ref 3.5–5.1)
Sodium: 137 mEq/L (ref 135–145)

## 2012-09-24 LAB — URINE CULTURE: Culture: NO GROWTH

## 2012-09-24 LAB — GC/CHLAMYDIA PROBE AMP
CT Probe RNA: NEGATIVE
GC Probe RNA: NEGATIVE

## 2012-09-24 LAB — T4, FREE: Free T4: 2.34 ng/dL — ABNORMAL HIGH (ref 0.80–1.80)

## 2012-09-24 MED ORDER — PYRIDOXINE HCL 25 MG PO TABS
25.0000 mg | ORAL_TABLET | Freq: Three times a day (TID) | ORAL | Status: DC
  Administered 2012-09-24 – 2012-09-30 (×18): 25 mg via ORAL
  Filled 2012-09-24 (×23): qty 1

## 2012-09-24 MED ORDER — DOXYLAMINE SUCCINATE (SLEEP) 25 MG PO TABS
25.0000 mg | ORAL_TABLET | Freq: Every day | ORAL | Status: DC
  Filled 2012-09-24: qty 1

## 2012-09-24 MED ORDER — DIPHENHYDRAMINE HCL 25 MG PO CAPS
25.0000 mg | ORAL_CAPSULE | Freq: Every evening | ORAL | Status: DC | PRN
  Administered 2012-09-24: 25 mg via ORAL
  Filled 2012-09-24: qty 1

## 2012-09-24 NOTE — Progress Notes (Signed)
FMTS Daily Progress Note  Subjective: Doing okay this morning.  Reports a lot of nausea with full liquid breakfast this morning, but improved with IV nausea medicine.  No other concerns or complaints.   I have reviewed the patient's medications.  Objective Temp:  [98.5 F (36.9 C)-98.8 F (37.1 C)] 98.5 F (36.9 C) (06/08 0513) Pulse Rate:  [95-104] 95 (06/08 0513) Resp:  [17-20] 18 (06/08 0513) BP: (86-111)/(42-60) 86/42 mmHg (06/08 0513) SpO2:  [98 %-100 %] 100 % (06/08 0513)   Intake/Output Summary (Last 24 hours) at 09/24/12 0947 Last data filed at 09/24/12 0700  Gross per 24 hour  Intake 6684.17 ml  Output      0 ml  Net 6684.17 ml    CBG (last 3)  No results found for this basename: GLUCAP,  in the last 72 hours  General: alert, cooperative, NAD HEENT: MMM FHT: not found with doppler, will recheck with ultrasound Neuro: alert, oriented, moving all extremities  Labs and Imaging  Recent Labs Lab 09/22/12 2115  WBC 10.2  HGB 12.2  HCT 32.9*  PLT 294     Recent Labs Lab 09/23/12 0440 09/23/12 1940 09/24/12 0355  NA 135 137 137  K 2.5* 3.0* 3.1*  CL 102 109 108  CO2 20 21 23   BUN <3* <3* <3*  CREATININE 0.42* 0.37* 0.36*  GLUCOSE 100* 127* 93  CALCIUM 8.5 8.3* 8.1*   Free T3: 5.1 Free T4: 2.34  Assessment and Plan 27 yo G4P2012 at [redacted]w[redacted]d by LMP with hyperemesis gravidarum and h/o hyperthyroidism.  # Hyperemesis gravidarum: Slowly improving.  Tolerating more PO.  Hyperthyroidism can be associated with this.  Free T3, and T4 are mildly elevated, but more likely related to early pregnancy. - continue IV zofran and phenergan - start B6 25mg  TID and doxylamine 25mg  qHS - advance diet as tolerated  # H/o hyperthyroidsim: Low TSH and mildly elevated free T3/T4. - discussed with Dr. Erin Fulling who recommends recheck in 6 weeks, no acute treatment needed  # Pregnancy - will check FHT today - prenatal vitamin daily - has new OB appt at [facility] on  6/17  FEN/GI: D5 LR + 20KCl at 150cc/hr; advance diet as tolerated; replete K as needed (KDur given this am) PPx: SQ heparin Dispo: pending tolerating PO  BOOTH, ERIN Pager: [phone] 09/24/2012, 9:47 AM

## 2012-09-24 NOTE — Progress Notes (Signed)
Family Medicine Teaching Service Attending Note  I interviewed and examined patient Grace [name] and reviewed their tests and x-rays.  I discussed with Dr. Elwyn Reach and reviewed their note for today.  I agree with their assessment and plan.     Additionally  About the same with not taking much oral and continued nausea with lightheadness when tries to walk Continue antiemetics and IVF

## 2012-09-25 DIAGNOSIS — A599 Trichomoniasis, unspecified: Secondary | ICD-10-CM

## 2012-09-25 DIAGNOSIS — E876 Hypokalemia: Secondary | ICD-10-CM

## 2012-09-25 LAB — BASIC METABOLIC PANEL
Chloride: 106 mEq/L (ref 96–112)
GFR calc Af Amer: >90 mL/min (ref >90)
GFR calc non Af Amer: >90 mL/min (ref >90)
Glucose, Bld: 84 mg/dL (ref 70–99)
Potassium: 3.5 mEq/L (ref 3.5–5.1)
Sodium: 136 mEq/L (ref 135–145)

## 2012-09-25 NOTE — Progress Notes (Signed)
NUTRITION FOLLOW UP  Intervention:   1. RD to order snacks in between meals per patient preferences. Pt declined oral nutrition supplements (i.e. Ensure, Boost, etc.) at this time. 2. If intake does not improve, or pt unable to keep foods down, recommend initiation of enteral nutrition. 3. RD to continue to follow nutrition care plan.  Nutrition Dx:   Inadequate oral intake related to hyperemesis as evidenced by pt report, wt loss. Ongoing.  Goal:   1. Slow advancement of diet to [facility] goal with pt meeting >/=90% estimated needs.  2. Resume pregnancy wt goals of 0.5-1 lbs/wk during second and third trimester.  Monitor:   Weights, labs, po intake, I/O's  Assessment:   Pt is [redacted] wks pregnant, admitted for hyperemesis. Pt with vomiting overnight and limited tolerance of water.   Pt reports 164 lbs as per pregnancy wt. Pt is not meeting standard wt gain goals for pregnancy, however reports that she typically gains </=10 lbs with her previous pregnancies.  She has been on combination phenergan and zofran therapy x 4 weeks.   Receiving D5 LR with KCl 20 mEq/L at 150 ml/hr.  Advanced to [place] on 6/8. Consuming minimal amounts of meals. Ate fruit and drank juice for breakfast.  Agreeable to snacks in between meals instead of Ensure. Discussed with RN.  Height: Ht Readings from Last 1 Encounters:  09/23/12 5\' 4"  (1.626 m)    Weight Status:   Wt Readings from Last 1 Encounters:  09/24/12 162 lb 11.2 oz (73.8 kg)  Wt gain of 2 lb x 3 days.  Re-estimated needs:  Kcal: 1750 - 1920 kcal Protein: 79 - 87 grams Fluid: at least 2 liters daily  Skin: intact  Diet Order: Parke Simmers   Intake/Output Summary (Last 24 hours) at 09/25/12 1029 Last data filed at 09/25/12 0900  Gross per 24 hour  Intake   4560 ml  Output      0 ml  Net   4560 ml    Last BM: 6/5   Labs:   Recent Labs Lab 09/23/12 0440 09/23/12 1940 09/24/12 0355 09/25/12 0545  NA 135 137 137 136  K 2.5* 3.0*  3.1* 3.5  CL 102 109 108 106  CO2 20 21 23 22   BUN <3* <3* <3* <3*  CREATININE 0.42* 0.37* 0.36* 0.40*  CALCIUM 8.5 8.3* 8.1* 8.4  MG  --  1.5  --   --   GLUCOSE 100* 127* 93 84    CBG (last 3)  No results found for this basename: GLUCAP,  in the last 72 hours  Scheduled Meds: . docusate sodium  100 mg Oral Daily  . heparin  5,000 Units Subcutaneous Q8H  . prenatal multivitamin  1 tablet Oral Q1200  . vitamin B-6  25 mg Oral TID    Continuous Infusions: . dextrose 5% lactated ringers with KCl 20 mEq/L 150 mL/hr (09/25/12 0812)    Jarold Motto MS, RD, LDN Pager: [phone] After-hours pager: [phone]

## 2012-09-25 NOTE — Progress Notes (Signed)
FMTS Daily Progress Note Service Pager [phone]  Subjective: Pt reports that last vomit was yesterday PM after taking potassium pill. Has nibbled on food this morning. Denies being in pain.  I have reviewed the patient's medications.  Objective Temp:  [98.6 F (37 C)-99.1 F (37.3 C)] 98.6 F (37 C) (06/09 0458) Pulse Rate:  [89-112] 89 (06/09 0458) Resp:  [18] 18 (06/09 0458) BP: (89-98)/(44-60) 89/44 mmHg (06/09 0458) SpO2:  [100 %] 100 % (06/09 0458) Weight:  [162 lb 11.2 oz (73.8 kg)] 162 lb 11.2 oz (73.8 kg) (06/08 2107)  General: alert, cooperative, NAD, lying in bed Heart: RRR Lungs: NWOB Abd: soft, nontender, nondistended to palpation FHT: 164 with doppler Neuro: grossly nonfocal, speech intact  Labs and Imaging  CBC  Recent Labs Lab 09/22/12 2115  WBC 10.2  HGB 12.2  HCT 32.9*  PLT 294     BMET  Recent Labs Lab 09/23/12 1940 09/24/12 0355 09/25/12 0545  NA 137 137 136  K 3.0* 3.1* 3.5  CL 109 108 106  CO2 21 23 22   BUN <3* <3* <3*  CREATININE 0.37* 0.36* 0.40*  GLUCOSE 127* 93 84  CALCIUM 8.3* 8.1* 8.4       Free T3: 5.1 Free T4: 2.34  Medications:  Scheduled Meds: . docusate sodium  100 mg Oral Daily  . heparin  5,000 Units Subcutaneous Q8H  . prenatal multivitamin  1 tablet Oral Q1200  . vitamin B-6  25 mg Oral TID   Continuous Infusions: . dextrose 5% lactated ringers with KCl 20 mEq/L 150 mL/hr (09/25/12 0812)   PRN Meds:.acetaminophen, calcium carbonate, diphenhydrAMINE, ondansetron (ZOFRAN) IV, promethazine, zolpidem   Assessment and Plan Grace [name] is a 27 y.o. [id number] at [redacted]w[redacted]d by LMP (confirmed by [redacted]w[redacted]d u/s) with hyperemesis gravidarum and hx of hyperthyroidism.  # Hyperemesis gravidarum: Slowly improving.  Tolerating more PO.  Hyperthyroidism can be associated with this.  Free T3, and T4 are mildly elevated, but more likely related to early pregnancy. - continue IV zofran and phenergan - Continue vitamin B6 25mg   TID; doxylamine not available in hospital so benadryl prn instead - advance diet as tolerated  # H/o hyperthyroidsim: Low TSH and mildly elevated free T3/T4. - our team has previously discussed with Dr. Erin Fulling (OB/GYN) who recommends recheck in 6 weeks, no acute treatment needed, will recheck at [facility] appointment  # Pregnancy - prenatal vitamin daily - has new OB appt at [facility] on 6/17 [ ]  check FHT daily  # Trichomonas: s/p IV flagyl for tx - gc/chlamydia tested on 6/7 and were negative [ ]  educate pt re: need for partner treatment - need to advise patient to abstain for 14 days after both she and her partner are treated  # Possible UTI:  - urine cx no growth - can consider UTI prophylaxis as outpatient once establishes with [facility]  FEN/GI: D5 LR + 20KCl at 150cc/hr; advance diet as tolerated; replete K as needed PPx: SQ heparin Dispo: pending tolerating PO  Levert Feinstein Pager: [phone] 09/25/2012, 9:02 AM

## 2012-09-25 NOTE — Progress Notes (Signed)
FMTS Attending Admission Note: Grace Seubert,MD I  have seen and examined this patient, reviewed their chart. I have discussed this patient with the resident. I agree with the resident's findings, assessment and care plan.  

## 2012-09-26 MED ORDER — ONDANSETRON HCL 8 MG PO TABS
8.0000 mg | ORAL_TABLET | Freq: Once | ORAL | Status: DC
  Filled 2012-09-26: qty 1

## 2012-09-26 MED ORDER — PROMETHAZINE HCL 25 MG PO TABS
25.0000 mg | ORAL_TABLET | Freq: Four times a day (QID) | ORAL | Status: DC | PRN
  Administered 2012-09-26 – 2012-09-27 (×2): 25 mg via ORAL
  Filled 2012-09-26 (×2): qty 1

## 2012-09-26 MED ORDER — SODIUM CHLORIDE 0.9 % IV BOLUS (SEPSIS)
1000.0000 mL | Freq: Once | INTRAVENOUS | Status: AC
  Administered 2012-09-26: 1000 mL via INTRAVENOUS

## 2012-09-26 NOTE — Progress Notes (Addendum)
Family Practice Teaching Service Interval Progress Note  Received page that pt was tachycardic to 112 and feeling weak. Went to evaluate pt. She says she feels like her heart is beating fast. She ate her lunch and has kept it down so far. She does endorse feeling weak.  Exam: BP 114/63  Pulse 112  Temp(Src) 98.1 F (36.7 C) (Oral)  Resp 18  Ht 5\' 4"  (1.626 m)  Wt 162 lb 12.8 oz (73.846 kg)  BMI 27.93 kg/m2  SpO2 100%  LMP 06/28/2012 Gen: NAD, lying in bed comfortably HEENT: MMM Heart: tachycardic with overall regular rhythm, ? Sinus arrhythmia vs. Rare PVC from auscultation Resp: normal respiratory effort Abd: soft, nontender to palpation FHT: obtained using doppler, 165 Neuro: grossly nonfocal, speech intact  Plan: - obtain EKG now to evaluate heart rhythm - 1L NS bolus now - instructed pt not to ambulate without assistance while feeling weak  Levert Feinstein, MD Family Medicine PGY-1 Service Pager [phone]  Addendum: EKG reviewed, appears to be sinus tachycardia with occasional PAC's -will monitor overnight and continue as before/above  Bobbye Morton, MD PGY-1, [facility] Family Medicine FPTS Intern pager: [phone]

## 2012-09-26 NOTE — Progress Notes (Signed)
FMTS Attending Admission Note: Zeth Buday,MD Pager [phone] I  have seen and examined this patient, reviewed their chart. I have discussed this patient with the resident. I agree with the resident's findings, assessment and care plan.  

## 2012-09-26 NOTE — Progress Notes (Signed)
FMTS Daily Progress Note Service Pager [phone]  Subjective: Vomited liquids yesterday evening but kept her dinner down. Has been nauseated this morning. Denies abdominal pain.  I have reviewed the patient's medications.  Objective Temp:  [98.1 F (36.7 C)-99.1 F (37.3 C)] 98.1 F (36.7 C) (06/10 0527) Pulse Rate:  [63-102] 90 (06/10 0527) Resp:  [17-18] 18 (06/10 0527) BP: (88-102)/(54-63) 96/54 mmHg (06/10 0527) SpO2:  [100 %] 100 % (06/10 0527) Weight:  [162 lb 12.8 oz (73.846 kg)] 162 lb 12.8 oz (73.846 kg) (06/09 2049)  General: alert, cooperative, NAD, lying in bed Heart: RRR Lungs: NWOB, CTAB Abd: soft, nontender, nondistended to palpation Neuro: grossly nonfocal, speech intact Ext: calves nontender to palpation  Labs and Imaging  CBC  Recent Labs Lab 09/22/12 2115  WBC 10.2  HGB 12.2  HCT 32.9*  PLT 294     BMET  Recent Labs Lab 09/23/12 1940 09/24/12 0355 09/25/12 0545  NA 137 137 136  K 3.0* 3.1* 3.5  CL 109 108 106  CO2 21 23 22   BUN <3* <3* <3*  CREATININE 0.37* 0.36* 0.40*  GLUCOSE 127* 93 84  CALCIUM 8.3* 8.1* 8.4       Free T3: 5.1 Free T4: 2.34  Medications:  Scheduled Meds: . docusate sodium  100 mg Oral Daily  . heparin  5,000 Units Subcutaneous Q8H  . prenatal multivitamin  1 tablet Oral Q1200  . vitamin B-6  25 mg Oral TID   Continuous Infusions: . dextrose 5% lactated ringers with KCl 20 mEq/L 150 mL/hr at 09/25/12 2305   PRN Meds:.acetaminophen, calcium carbonate, diphenhydrAMINE, ondansetron (ZOFRAN) IV, promethazine, zolpidem   Assessment and Plan Grace [name] is a 27 y.o. [id number] at [redacted]w[redacted]d by LMP (confirmed by [redacted]w[redacted]d u/s) with hyperemesis gravidarum and hx of hyperthyroidism.  # Hyperemesis gravidarum: Slowly improving.  Tolerating more PO.  Hyperthyroidism can be associated with this.  Free T3, and T4 are mildly elevated, but more likely related to early pregnancy. - continue zofran and phenergan prn -  Continue vitamin B6 25mg  TID; doxylamine not available in hospital so benadryl prn instead - SLIV, possible d/c later today if tolerating PO still without IVF  # H/o hyperthyroidsim: Low TSH and mildly elevated free T3/T4. - our team has previously discussed with Dr. Erin Fulling (OB/GYN) who recommends recheck in 6 weeks, no acute treatment needed [ ]  recheck thyroid studies at [facility] appointment  # Pregnancy - prenatal vitamin daily - has new OB appt at [facility] on 6/17 [ ]  check FHT daily - will go back today to check these  # Trichomonas: s/p IV flagyl for tx - gc/chlamydia tested on 6/7 and were negative - have educated pt re: need for partner treatment - need to advise patient to abstain for 14 days after both she and her partner are treated  # Possible UTI:  - urine cx no growth - can consider UTI prophylaxis as outpatient once establishes with [facility]  FEN/GI: SLIV today; PO ad lib PPx: SQ heparin Dispo: pending tolerating PO, possibly later today  Levert Feinstein Pager: [phone] 09/26/2012, 8:56 AM

## 2012-09-27 ENCOUNTER — Inpatient Hospital Stay (HOSPITAL_COMMUNITY): Payer: Medicaid Other

## 2012-09-27 DIAGNOSIS — O211 Hyperemesis gravidarum with metabolic disturbance: Secondary | ICD-10-CM

## 2012-09-27 DIAGNOSIS — O99891 Other specified diseases and conditions complicating pregnancy: Secondary | ICD-10-CM

## 2012-09-27 LAB — BASIC METABOLIC PANEL
BUN: 4 mg/dL — ABNORMAL LOW (ref 6–23)
CO2: 24 mEq/L (ref 19–32)
Chloride: 103 mEq/L (ref 96–112)
GFR calc Af Amer: >90 mL/min (ref >90)
Potassium: 4 mEq/L (ref 3.5–5.1)

## 2012-09-27 MED ORDER — OXYCODONE-ACETAMINOPHEN 5-325 MG PO TABS
1.0000 | ORAL_TABLET | Freq: Four times a day (QID) | ORAL | Status: DC | PRN
  Administered 2012-09-30: 1 via ORAL
  Filled 2012-09-27: qty 1

## 2012-09-27 MED ORDER — OXYCODONE-ACETAMINOPHEN 5-325 MG PO TABS
ORAL_TABLET | ORAL | Status: AC
  Filled 2012-09-27: qty 1

## 2012-09-27 MED ORDER — SODIUM CHLORIDE 0.9 % IV SOLN
INTRAVENOUS | Status: DC
  Administered 2012-09-27 – 2012-09-28 (×2): via INTRAVENOUS

## 2012-09-27 MED ORDER — ONDANSETRON 8 MG/NS 50 ML IVPB
8.0000 mg | Freq: Three times a day (TID) | INTRAVENOUS | Status: DC | PRN
  Filled 2012-09-27: qty 8

## 2012-09-27 MED ORDER — OXYCODONE-ACETAMINOPHEN 5-325 MG PO TABS: 1.0000 | ORAL_TABLET | Freq: Once | ORAL | Status: AC

## 2012-09-27 MED ORDER — PROMETHAZINE HCL 25 MG/ML IJ SOLN
25.0000 mg | Freq: Four times a day (QID) | INTRAMUSCULAR | Status: DC | PRN
  Administered 2012-09-27 – 2012-09-28 (×3): 25 mg via INTRAVENOUS
  Filled 2012-09-27 (×3): qty 1

## 2012-09-27 NOTE — ED Provider Notes (Signed)
Medical screening examination/treatment/procedure(s) were conducted as a shared visit with non-physician practitioner(s) and myself.  I personally evaluated the patient during the encounter  Derwood Kaplan, MD 09/27/12 1636

## 2012-09-27 NOTE — Progress Notes (Signed)
FMTS Attending Admission Note: Kehinde Eniola,MD Pager [phone] I  have seen and examined this patient, reviewed their chart. I have discussed this patient with the resident. I agree with the resident's findings, assessment and care plan.  

## 2012-09-27 NOTE — Progress Notes (Signed)
Family Practice Teaching Service Interval Progress Note  Returned to patient's room to perform fetal heart rate with doppler. I am unable to find any fetal heart tones after approximately 10 minutes of attempting to find heart tones. As patient is at [redacted]w[redacted]d gestation today, fetus is nonviable. Will obtain non-urgent OB ultrasound today to assess for fetal cardiac activity.  Levert Feinstein, MD Family Medicine PGY-1 Service Pager [phone]

## 2012-09-27 NOTE — Progress Notes (Signed)
Family Medicine Teaching Service Brief Progress Service Pager: [phone]  Patient name: Grace [name] Medical record number: [id number] Date of birth: [birth year] Age: 27 y.o. Gender: female Length of Stay:  LOS: 5 days   US performed.  Fetal HR @ 165.  Can consider d/c fetal monitoring as pre-viability and pt is not have bleeding or cramping.   Andrena Mews, DO [facility] Family [facility] Resident - PGY-2 09/27/2012 6:15 PM

## 2012-09-27 NOTE — Progress Notes (Signed)
FMTS Daily Progress Note Service Pager [phone]  Subjective: Yesterday afternoon pt was tachycardic to 112 and felt weak. Received a 1L NS bolus, which she says did not help her feel better. Still complains of nausea this morning. She tried taking a PO antiemetic last night and vomited it up. She thinks she is overall improving but is not quite ready to go home. She has had a headache overnight, similar to her normal migraine. She did see some flashing spots when she first woke up this morning but that has gone away and she isn't seeing any currently. States she wouldn't want to try zofran ODT as it causes her a headache normally.  I have reviewed the patient's medications.  Objective Temp:  [97.4 F (36.3 C)-98.9 F (37.2 C)] 97.8 F (36.6 C) (06/11 0505) Pulse Rate:  [96-112] 96 (06/11 0505) Resp:  [18] 18 (06/11 0505) BP: (96-114)/(52-70) 96/62 mmHg (06/11 0505) SpO2:  [98 %-100 %] 98 % (06/11 0505) Weight:  [162 lb 12.8 oz (73.846 kg)] 162 lb 12.8 oz (73.846 kg) (06/10 2038)  General: alert, cooperative, NAD, lying in bed HEENT: MMM Heart: tachycardic, regular rhythm with occasional early beat Lungs: NWOB, CTAB via anterior auscultation Abd: soft, nontender, nondistended to palpation Ext: calves nontender Neuro: grossly nonfocal, speech intact. Grip 5/5 bilaterally. EOMI. Hip abduction strength normal bilaterally. Facial sensation intact to light touch bilaterally. Tongue protrudes midline, face symmetric.  Labs and Imaging  CBC  Recent Labs Lab 09/22/12 2115  WBC 10.2  HGB 12.2  HCT 32.9*  PLT 294     BMET  Recent Labs Lab 09/23/12 1940 09/24/12 0355 09/25/12 0545  NA 137 137 136  K 3.0* 3.1* 3.5  CL 109 108 106  CO2 21 23 22   BUN <3* <3* <3*  CREATININE 0.37* 0.36* 0.40*  GLUCOSE 127* 93 84  CALCIUM 8.3* 8.1* 8.4       Free T3: 5.1 Free T4: 2.34  Medications:  Scheduled Meds: . docusate sodium  100 mg Oral Daily  . heparin  5,000 Units  Subcutaneous Q8H  . ondansetron  8 mg Oral Once  . oxyCODONE-acetaminophen  1 tablet Oral Once  . oxyCODONE-acetaminophen      . prenatal multivitamin  1 tablet Oral Q1200  . vitamin B-6  25 mg Oral TID   Continuous Infusions:   PRN Meds:.acetaminophen, calcium carbonate, diphenhydrAMINE, promethazine, zolpidem   Assessment and Plan Grace [name] is a 27 y.o. A5W0981 at [redacted]w[redacted]d by LMP (confirmed by [redacted]w[redacted]d u/s) with hyperemesis gravidarum and hx of hyperthyroidism.  # Hyperemesis gravidarum: Slowly improving.  Tolerating more PO.  Hyperthyroidism can be associated with this.  Free T3, and T4 are mildly elevated, but more likely related to early pregnancy. - continue zofran and phenergan prn - will switch back to IV today since is not tolerating PO antiemetics - Continue vitamin B6 25mg  TID; doxylamine not available in hospital so benadryl prn instead - restart IVF with NS @ 125cc/hr  # Headache: reports migraine-like pain. Neuro exam nonfocal. Percocet helped her sleep but the pain is still there - avoid toradol/NSAIDs due to pregnancy - will try percocet again for HA  # Tachycardia: possibly secondary to dehydration, although patient appears clinically well hydrated. Other explanations include her hyperthyroidism. - EKG showed sinus tach with occasional PAC's on 6/10 - continue to monitor, no intervention warranted at this time as HR remains <120  # H/o hyperthyroidsim: Low TSH and mildly elevated free T3/T4. - our team has previously  discussed with Dr. Erin Fulling (OB/GYN) who recommends recheck in 6 weeks, no acute treatment needed [ ]  recheck thyroid studies at [facility] appointment  # Pregnancy - prenatal vitamin daily - has new OB appt at [facility] on 6/17 [ ]  check FHT daily - will go back today to do this with doppler from clinic  # Trichomonas: s/p IV flagyl for tx - gc/chlamydia tested on 6/7 and were negative - have already educated pt re: need for partner treatment - need  to advise patient to abstain for 14 days after both she and her partner are treated  # Possible UTI:  - urine cx no growth - can consider UTI prophylaxis as outpatient once establishes with [facility]  FEN/GI: SLIV, PO ad lib PPx: SQ heparin Dispo: pending tolerating PO  Levert Feinstein Pager: [phone] 09/27/2012, 8:42 AM

## 2012-09-27 NOTE — Progress Notes (Signed)
NUTRITION FOLLOW UP  Intervention:   1. Continue snacks TID between meals. 2. Consider more aggressive bowel regimen; last BM was 6 days ago. 3. RD to continue to follow nutrition care plan.  Nutrition Dx:   Inadequate oral intake related to hyperemesis as evidenced by pt report, wt loss. Ongoing.  Goal:   1. Slow advancement of diet to [facility] goal with pt meeting >/=90% estimated needs.  2. Resume pregnancy wt goals of 0.5-1 lbs/wk during second and third trimester.  Monitor:   Weights, labs, po intake, I/O's  Assessment:   Pt is [redacted] wks pregnant, admitted for hyperemesis. Pt with vomiting overnight and limited tolerance of water.   Pt reports 164 lbs as per pregnancy wt. Pt is not meeting standard wt gain goals for pregnancy, however reports that she typically gains </=10 lbs with her previous pregnancies.  She has been on combination phenergan and zofran therapy x 4 weeks.   Advanced to [place] on 6/8. Pt reports that she is tolerating fairly well. Discussed during progression rounds - RN notes that pt reported she vomited last night however no one witnessed this. Pt reports that she is receiving and eating snacks.  Height: Ht Readings from Last 1 Encounters:  09/26/12 5\' 4"  (1.626 m)    Weight Status:   Wt Readings from Last 1 Encounters:  09/26/12 162 lb 12.8 oz (73.846 kg)  160 lb on admit  Re-estimated needs:  Kcal: 1750 - 1920 kcal Protein: 79 - 87 grams Fluid: at least 2 liters daily  Skin: intact  Diet Order: Parke Simmers   Intake/Output Summary (Last 24 hours) at 09/27/12 0944 Last data filed at 09/27/12 0506  Gross per 24 hour  Intake    720 ml  Output      0 ml  Net    720 ml    Last BM: 6/5   Labs:   Recent Labs Lab 09/23/12 0440 09/23/12 1940 09/24/12 0355 09/25/12 0545  NA 135 137 137 136  K 2.5* 3.0* 3.1* 3.5  CL 102 109 108 106  CO2 20 21 23 22   BUN <3* <3* <3* <3*  CREATININE 0.42* 0.37* 0.36* 0.40*  CALCIUM 8.5 8.3* 8.1* 8.4  MG   --  1.5  --   --   GLUCOSE 100* 127* 93 84    CBG (last 3)  No results found for this basename: GLUCAP,  in the last 72 hours  Scheduled Meds: . docusate sodium  100 mg Oral Daily  . heparin  5,000 Units Subcutaneous Q8H  . ondansetron  8 mg Oral Once  . oxyCODONE-acetaminophen  1 tablet Oral Once  . oxyCODONE-acetaminophen      . prenatal multivitamin  1 tablet Oral Q1200  . vitamin B-6  25 mg Oral TID    Continuous Infusions: none    Jarold Motto MS, RD, LDN Pager: [phone] After-hours pager: [phone]

## 2012-09-28 MED ORDER — PROMETHAZINE HCL 25 MG PO TABS
25.0000 mg | ORAL_TABLET | Freq: Three times a day (TID) | ORAL | Status: DC
  Administered 2012-09-28 – 2012-09-30 (×4): 25 mg via ORAL
  Filled 2012-09-28 (×8): qty 1

## 2012-09-28 MED ORDER — ONDANSETRON HCL 4 MG/2ML IJ SOLN
4.0000 mg | Freq: Once | INTRAMUSCULAR | Status: AC
  Administered 2012-09-28: 4 mg via INTRAVENOUS
  Filled 2012-09-28: qty 2

## 2012-09-28 MED ORDER — LABETALOL HCL 100 MG PO TABS
50.0000 mg | ORAL_TABLET | Freq: Once | ORAL | Status: AC
  Administered 2012-09-28: 50 mg via ORAL
  Filled 2012-09-28: qty 0.5

## 2012-09-28 MED ORDER — ONDANSETRON HCL 4 MG PO TABS
4.0000 mg | ORAL_TABLET | Freq: Three times a day (TID) | ORAL | Status: DC | PRN
  Administered 2012-09-28: 4 mg via ORAL
  Filled 2012-09-28: qty 1

## 2012-09-28 NOTE — Progress Notes (Signed)
FMTS Daily Progress Note Service Pager [phone]  Subjective: Pt still has nausea. Willing to d/c IV fluids today and try oral antiemetics. Tolerating PO thus far today. Headache has resolved.  I have reviewed the patient's medications.  Objective Temp:  [97.7 F (36.5 C)-98.7 F (37.1 C)] 97.7 F (36.5 C) (06/12 0550) Pulse Rate:  [80-104] 104 (06/12 0550) Resp:  [18] 18 (06/12 0550) BP: (98-105)/(58-61) 98/58 mmHg (06/12 0550) SpO2:  [98 %-100 %] 100 % (06/12 0550) Weight:  [162 lb 12.8 oz (73.846 kg)] 162 lb 12.8 oz (73.846 kg) (06/11 2051)  General: alert, cooperative, NAD, lying in bed HEENT: MMM Heart: tachycardic, regular rhythm Lungs: NWOB, CTAB via anterior auscultation Abd: soft, nontender, nondistended to palpation Ext: calves nontender Neuro: grossly nonfocal, speech intact  Labs and Imaging  CBC  Recent Labs Lab 09/22/12 2115  WBC 10.2  HGB 12.2  HCT 32.9*  PLT 294     BMET  Recent Labs Lab 09/24/12 0355 09/25/12 0545 09/27/12 0850  NA 137 136 137  K 3.1* 3.5 4.0  CL 108 106 103  CO2 23 22 24   BUN <3* <3* 4*  CREATININE 0.36* 0.40* 0.48*  GLUCOSE 93 84 107*  CALCIUM 8.1* 8.4 8.4       Free T3: 5.1 Free T4: 2.34  Medications:  Scheduled Meds: . docusate sodium  100 mg Oral Daily  . heparin  5,000 Units Subcutaneous Q8H  . prenatal multivitamin  1 tablet Oral Q1200  . vitamin B-6  25 mg Oral TID   Continuous Infusions: . sodium chloride 125 mL/hr at 09/28/12 0818   PRN Meds:.acetaminophen, calcium carbonate, diphenhydrAMINE, ondansetron (ZOFRAN) IV, oxyCODONE-acetaminophen, promethazine, zolpidem   Assessment and Plan Grace [name] is a 27 y.o. [id number] at [redacted]w[redacted]d by LMP (confirmed by [redacted]w[redacted]d u/s) with hyperemesis gravidarum and hx of hyperthyroidism.  # Hyperemesis gravidarum: Slowly improving.  Tolerating more PO.  Hyperthyroidism can be associated with this.  Free T3, and T4 are mildly elevated, but more likely related to  early pregnancy. - SLIV today.  - schedule PO phenergan q8h with prn zofran PO - Continue vitamin B6 25mg  TID; doxylamine not available in hospital so benadryl prn instead - will place order for RN to ambulate patient today, likely that her weakness is partially from lying in bed so much  # Headache: reported migraine-like pain. Neuro exam nonfocal at time of headache. - HA resolved today  # Tachycardia: possibly secondary to dehydration, although patient appears clinically well hydrated. Other explanations include her hyperthyroidism. - EKG showed sinus tach with occasional PAC's on 6/10 - continue to monitor, no intervention warranted at this time as HR remains <120  # H/o hyperthyroidsim: Low TSH and mildly elevated free T3/T4. - our team has previously discussed with Dr. Erin Fulling (OB/GYN) who recommends recheck in 6 weeks, no acute treatment needed [ ]  recheck thyroid studies at [facility] appointment  # Pregnancy - prenatal vitamin daily - has new OB appt at [facility] on 6/17 - could not find FHT yesterday with doppler, so ordered u/s which showed FHR of 165 yesterday PM - will plan to recheck FHT prior to discharge (either this PM or tomorrow AM)  # Trichomonas: s/p IV flagyl for tx - gc/chlamydia tested on 6/7 and were negative - have already educated pt re: need for partner treatment - need to advise patient to abstain for 14 days after both she and her partner are treated  # Possible UTI:  - urine cx no growth -  can consider UTI prophylaxis as outpatient once establishes with [facility]  FEN/GI: SLIV, PO ad lib PPx: SQ heparin Dispo: pending tolerating PO antiemetics - will hopefully d/c this PM or tomorrow AM  Levert Feinstein Pager: [phone] 09/28/2012, 9:04 AM

## 2012-09-28 NOTE — Progress Notes (Signed)
FMTS Attending Admission Note: Grace Mccambridge,MD Pager [phone] I  have seen and examined this patient, reviewed their chart. I have discussed this patient with the resident. I agree with the resident's findings, assessment and care plan.  

## 2012-09-28 NOTE — Progress Notes (Signed)
09/28/12 1830 nsg MD notified of EKG result sinus tachy after 1 hour HR 110 , BP 124/64. New orders noted.

## 2012-09-28 NOTE — Progress Notes (Signed)
RN called to notify of vomiting and tachycardia.  Per nursing, EKG showed sinus tachycardia at 108 with some PACs.  I was unable to locate the EKG.  S: She states that her heartbeat started racing this afternoon around 5pm.  After dinner, she got really hot.  I was called about the tachycardia earlier this evening and gave a dose of labetalol 50mg  PO.  Since eating she has vomited twice.  Unable to keep down PO zofran.  She is very frustrated and tired of vomiting and feeling weak.  She does not have any ideas on how to make this better.  O: BP 112/68  Pulse 114  Temp(Src) 98 F (36.7 C) (Oral)  Resp 18  Ht 5\' 4"  (1.626 m)  Wt 162 lb 12.8 oz (73.846 kg)  BMI 27.93 kg/m2  SpO2 100%  LMP 06/28/2012 Gen: alert, cooperative, spitting occasionally HEENT: MMM CV: mild tachycardia, regular rhythm  A/P: 27 yo [id number] at [redacted]w[redacted]d by early ultrasound with hyperemesis gravidum. - weight is stable - does not appear dehydrated - heart rate is regular - will give zofran 4mg  IV x1 and repeat labetalol 50mg   - discussed that we will not completely get rid of her nausea or vomiting - discussed that since her weight is stable, she is getting enough food - I will touch base with the OBs tomorrow to confirm that do not need to do anything further for the tachycardia given her mild elevation of thyroid hormones  BOOTH, ERIN 09/28/2012, 10:47 PM

## 2012-09-28 NOTE — Progress Notes (Signed)
Chaplain Note: Pt lying in bed watching tv when I arrived. Offered emotional and spiritual support.  Pt states she is trying to take a nap but will page for chaplain as needed.  Rutherford Nail Chaplain [phone]

## 2012-09-28 NOTE — Progress Notes (Signed)
09/28/12 1645 nsg Pt c/o her heart racing v/s taken HR 120 ; bp 111/63. MD notified new orders noted.

## 2012-09-29 LAB — CBC
MCH: 30.4 pg (ref 26.0–34.0)
MCV: 85.3 fL (ref 78.0–100.0)
Platelets: 322 10*3/uL (ref 150–400)
RBC: 3.68 MIL/uL — ABNORMAL LOW (ref 3.87–5.11)
RDW: 12.4 % (ref 11.5–15.5)

## 2012-09-29 LAB — TROPONIN I: Troponin I: <0.3 ng/mL (ref <0.30)

## 2012-09-29 MED ORDER — ALUM & MAG HYDROXIDE-SIMETH 200-200-20 MG/5ML PO SUSP
30.0000 mL | Freq: Once | ORAL | Status: AC
  Administered 2012-09-29: 30 mL via ORAL
  Filled 2012-09-29: qty 30

## 2012-09-29 MED ORDER — LABETALOL HCL 100 MG PO TABS
100.0000 mg | ORAL_TABLET | Freq: Two times a day (BID) | ORAL | Status: DC
  Administered 2012-09-29 (×2): 100 mg via ORAL
  Filled 2012-09-29 (×4): qty 1

## 2012-09-29 MED ORDER — ATENOLOL 25 MG PO TABS: 25.0000 mg | ORAL_TABLET | Freq: Every day | ORAL | Status: DC

## 2012-09-29 MED ORDER — METHIMAZOLE 10 MG PO TABS
20.0000 mg | ORAL_TABLET | Freq: Two times a day (BID) | ORAL | Status: DC
  Administered 2012-09-29 – 2012-09-30 (×2): 20 mg via ORAL
  Filled 2012-09-29 (×3): qty 2

## 2012-09-29 MED ORDER — GI COCKTAIL ~~LOC~~: 30.0000 mL | Freq: Once | ORAL | Status: DC

## 2012-09-29 NOTE — Progress Notes (Signed)
FMTS Daily Progress Note Service Pager [phone]  Subjective: Pt still has nausea. Vomited last night. Was tachycardic as well with an EKG that showed sinus tach with PAC's. Pt feels like her heart is pounding hard this morning. She did get a dose of labetalol last night.  I have reviewed the patient's medications.  Objective Temp:  [97.9 F (36.6 C)-98.6 F (37 C)] 98.2 F (36.8 C) (06/13 0908) Pulse Rate:  [71-114] 108 (06/13 0908) Resp:  [17-19] 19 (06/13 0908) BP: (102-124)/(59-68) 112/60 mmHg (06/13 0908) SpO2:  [98 %-100 %] 100 % (06/13 0908) Weight:  [162 lb 12.8 oz (73.846 kg)] 162 lb 12.8 oz (73.846 kg) (06/12 2058)  General: alert, cooperative, NAD, lying in bed HEENT: MMM Heart: tachycardic, regular rhythm Lungs: NWOB, CTAB via anterior auscultation Abd: soft, nontender, nondistended to palpation Ext: calves nontender Neuro: grossly nonfocal, speech intact  Labs and Imaging  CBC  Recent Labs Lab 09/22/12 2115  WBC 10.2  HGB 12.2  HCT 32.9*  PLT 294     BMET  Recent Labs Lab 09/24/12 0355 09/25/12 0545 09/27/12 0850  NA 137 136 137  K 3.1* 3.5 4.0  CL 108 106 103  CO2 23 22 24   BUN <3* <3* 4*  CREATININE 0.36* 0.40* 0.48*  GLUCOSE 93 84 107*  CALCIUM 8.1* 8.4 8.4       Free T3: 5.1 Free T4: 2.34  Medications:  Scheduled Meds: . docusate sodium  100 mg Oral Daily  . heparin  5,000 Units Subcutaneous Q8H  . prenatal multivitamin  1 tablet Oral Q1200  . promethazine  25 mg Oral Q8H  . vitamin B-6  25 mg Oral TID   Continuous Infusions: . sodium chloride 125 mL/hr at 09/28/12 0818   PRN Meds:.acetaminophen, calcium carbonate, diphenhydrAMINE, ondansetron, oxyCODONE-acetaminophen, zolpidem   Assessment and Plan Grace [name] is a 27 y.o. [id number] at [redacted]w[redacted]d by LMP (confirmed by [redacted]w[redacted]d u/s) with hyperemesis gravidarum and hx of hyperthyroidism. Now with marked tachycardia at rest (130s).  # Hyperemesis gravidarum: Slowly improving.   Tolerating more PO.  Hyperthyroidism can be associated with this.  Free T3, and T4 are mildly elevated, but more likely related to early pregnancy. - discussed with patient that she will likely still have some n/v when she goes home, but as she is well hydrated and keeping most of her food down, she is stable for d/c from a hyperemesis point. Patient understands this. - continue SLIV - continue prn phenergan/zofran - Continue vitamin B6 25mg  TID; doxylamine not available in hospital so benadryl prn instead  # Tachycardia: most likely secondary to hyperthyroidism as she appears clinically well hydrated - EKG showed sinus tach with occasional PAC's on 6/10 [ ]  will speak with OB today about how to best approach hyperthyroidism with tachycardia as pt now has heart rate in 130s at rest.  # H/o hyperthyroidsim: Low TSH and mildly elevated free T3/T4. - our team has previously discussed with Dr. Erin Fulling (OB/GYN) who recommended recheck in 6 weeks [ ]  since patient is now markedly tachycardic at rest, will speak with OB as above and recheck thyroid studies today [ ]  will likely need outpatient endocrine f/u  # Pregnancy - prenatal vitamin daily - has new OB appt at [facility] on 6/17 - will plan to recheck FHT prior to discharge, fetus nonviable at [redacted]w[redacted]d  # Trichomonas: s/p IV flagyl for tx - gc/chlamydia tested on 6/7 and were negative - have already educated pt re: need for partner  treatment - need to advise patient to abstain for 14 days after both she and her partner are treated  # Possible UTI:  - urine cx no growth - can consider UTI prophylaxis as outpatient once establishes with [facility]  FEN/GI: SLIV, PO ad lib PPx: SQ heparin Dispo: pending tolerating PO antiemetics - will hopefully d/c this PM or tomorrow AM  Levert Feinstein Pager: [phone] 09/29/2012, 9:17 AM

## 2012-09-29 NOTE — Progress Notes (Signed)
Family Practice Teaching Service Interval Progress Note  Subjective: Was called by RN to report that patient is complaining of pressure feeling in her chest. Ordered stat EKG and troponins in setting of recent persistent tachycardia (likely secondary to untreated hyperthyroidism). Went to evaluate patient.  Pt reports feeling a heavy/pressure like feeling in her chest. No associated shortness of breath. The feeling is worse when she takes a deep breath. No abdominal pain. She has not vomited today and expresses relief that her nausea is improving. No pain in her legs. She does endorse a history of GERD, which she says has been worse in the past when she has been pregnant.  Objective: BP 103/57  Pulse 103  Temp(Src) 98.7 F (37.1 C) (Oral)  Resp 18  Ht 5\' 4"  (1.626 m)  Wt 162 lb 12.8 oz (73.846 kg)  BMI 27.93 kg/m2  SpO2 100%  LMP 06/28/2012 Gen: NAD, cooperative and pleasant Heart: tachycardic with regular rhythm, no murmurs auscultated Lungs: CTAB. Normal respiratory effort on room air. Abd: soft, nontender to palpation Chest: nontender to palpation on anterior chest/sternum Ext: no tenderness, swelling, or warmth in calves  EKG shows sinus tachycardia, no ST changes  A/P: Grace [name] is a 27 y.o. [id number] at [redacted]w[redacted]d who has been admitted with hyperemesis gravidarum, who has hx of untreated hyperthyroidism for which we initiated labetalol and methimazole today per endocrinology and OB recommendations, now with chest pressure. Chest pressure unlikely to be acute coronary syndrome, but given recent persistent tachycardia, will cycle troponins x3 overnight. I think this feeling is much more likely to be reflux related, and will try giving a dose of maalox to see if it improves her symptoms. Discussed this plan with patient, who is amenable. She understands that she will still likely be ready for discharge in the morning.  Levert Feinstein, MD Family Medicine PGY-1 Service Pager  [phone]

## 2012-09-29 NOTE — Progress Notes (Signed)
FMTS Attending Admission Note: Nicolette Bang Pager [phone] I  have seen and examined this patient, reviewed their chart. I have discussed this patient with the resident. I agree with the resident's findings, assessment and care plan.  Also recheck TFT and CBC today.

## 2012-09-29 NOTE — Progress Notes (Signed)
Family Medicine Teaching Service UL Interval Progress Note Service Pager: [phone]  Patient name: JAQUETTA CURRIER Medical record number: [id number] Date of birth: July 31, 1985 Age: 27 y.o. Gender: female Length of Stay:  LOS: 7 days   Re: treatment options for pt's tachycardia and hyperthyroidism.  I discussed with both Dr. Macon Large with [facility] as well as Dr. Romero Belling with [facility] regarding appropriateness of treatment of symptoms and abnormal thyroid levels.  Both physicians agree that treatment of this pt's hyperthyroid is appropriate at this time and both concur that tx with Methimazole is appropriate as she is past the first trimester.  Both agree that BB for rate control is also appropriate.  I discussed need for treatment with pt who elected to proceed with Methimazole (category D) and Lebetalol (category C) after discussing risks and benefits of treatment vs non-treatment.  Given severity of Hyperemesis and persistent sinus Tachycardia in setting of hyperthyroid the pt agreed to treatment with Methimazole 20mg  bid as well as Lebetalol 100mg  bid.  Pt is to follow up with ENDOCRINOLOGY early next week and will be provided with [facility]'s contact number.  Dr. Everardo All reports she will be able to make an appointment for next week if the pt calls on Monday. F/u with OB next week already established.    Plan to initiate treatment and anticipate discharge if pt tolerates BB well given her soft/normal BPs and concerns for medication induced hypotension/orthostasis.  Anticipate d/c tonight vs tomorrow.  [ ]  f/u HR [ ]  f/u repeat TFTs [ ]  f/u thyroid antibodies   Andrena Mews, DO [facility] Resident - PGY-2 09/29/2012 8:27 PM

## 2012-09-29 NOTE — Progress Notes (Signed)
Patient had a moderate size clear emesis. Patient also had previously reported an un-witnessed emesis. Patient continues to be tachycardic.  No complaint of pain, only nausea.  MD on call notified.

## 2012-09-29 NOTE — Progress Notes (Addendum)
Pt complained of feeling a lot of pressure in her chest stating she has had it for prior to calling the nurse. VS were stable. Heart rate 103. Denies any shortness of breath. Pt does not appear to be in distress. MD was called an notified and gave orders for an EKG and labs to be drawn. MD stated she would come and evaluate the patient. Orders were carried out. Will cont monitor.

## 2012-09-30 ENCOUNTER — Inpatient Hospital Stay (HOSPITAL_COMMUNITY): Payer: Medicaid Other

## 2012-09-30 MED ORDER — LABETALOL HCL 100 MG PO TABS: 100.0000 mg | ORAL_TABLET | Freq: Two times a day (BID) | ORAL | Status: DC

## 2012-09-30 MED ORDER — PYRIDOXINE HCL 25 MG PO TABS: 25.0000 mg | ORAL_TABLET | Freq: Three times a day (TID) | ORAL | Status: DC

## 2012-09-30 MED ORDER — METHIMAZOLE 10 MG PO TABS: 20.0000 mg | ORAL_TABLET | Freq: Two times a day (BID) | ORAL | Status: DC

## 2012-09-30 NOTE — Progress Notes (Signed)
FMTS Attending Admission Note: Nicolette Bang Pager [phone] I  have seen and examined this patient, reviewed their chart. I have discussed this patient with the resident. I agree with the resident's findings, assessment and care plan.  Patient improved clinically,had not vomited since yesterday,denies any other GI symptoms. She was started on Methimazole and Labetalol 100 mg BID for her Hyperthyroidism and Tachycardia,HR improved a lot,but BP borderline low. I suggested to her to monitor BP and HR at home,if BP is low may reduce Labetalol to 50 mg BID by cutting tab to half,she verbalized understanding. I also suggested she follow up with OB within a week after discharge to assess her thyroid dysfunction and HR as well as the baby,she agree with plan.

## 2012-09-30 NOTE — Progress Notes (Signed)
FMTS Daily Progress Note Service Pager [phone]  Subjective: Doing well this morning. No further emesis or CP. Feeling ready for DC  I have reviewed the patient's medications.  Objective Temp:  [98.1 F (36.7 C)-98.7 F (37.1 C)] 98.1 F (36.7 C) (06/14 0439) Pulse Rate:  [95-135] 95 (06/14 0439) Resp:  [18-20] 19 (06/14 0439) BP: (92-117)/(52-68) 92/52 mmHg (06/14 0439) SpO2:  [99 %-100 %] 99 % (06/14 0439) Weight:  [164 lb 3.9 oz (74.5 kg)] 164 lb 3.9 oz (74.5 kg) (06/13 2100)  General: alert, cooperative, NAD, lying in bed HEENT: MMM Heart: regular rhythm, II/VI murmur Lungs: NWOB, CTAB via anterior auscultation Abd: soft, nontender, nondistended to palpation Ext: calves nontender, no LE edema Neuro: grossly nonfocal, speech intact  Labs and Imaging  CBC  Recent Labs Lab 09/29/12 1649  WBC 8.1  HGB 11.2*  HCT 31.4*  PLT 322     BMET  Recent Labs Lab 09/24/12 0355 09/25/12 0545 09/27/12 0850  NA 137 136 137  K 3.1* 3.5 4.0  CL 108 106 103  CO2 23 22 24   BUN <3* <3* 4*  CREATININE 0.36* 0.40* 0.48*  GLUCOSE 93 84 107*  CALCIUM 8.1* 8.4 8.4       Free T3: 5.1 Free T4: 2.34  Medications:  Scheduled Meds: . docusate sodium  100 mg Oral Daily  . heparin  5,000 Units Subcutaneous Q8H  . labetalol  100 mg Oral BID  . methimazole  20 mg Oral BID  . prenatal multivitamin  1 tablet Oral Q1200  . promethazine  25 mg Oral Q8H  . vitamin B-6  25 mg Oral TID   Continuous Infusions: . sodium chloride 125 mL/hr at 09/28/12 0818   PRN Meds:.acetaminophen, calcium carbonate, diphenhydrAMINE, ondansetron, oxyCODONE-acetaminophen, zolpidem   Assessment and Plan Grace [name] is a 27 y.o. [id number] at [redacted]w[redacted]d by LMP (confirmed by [redacted]w[redacted]d u/s) with hyperemesis gravidarum and hx of hyperthyroidism w/ improving tachycardia  # Hyperemesis gravidarum: Resolved.  Tolerating PO.   Hyperthyroidism can be associated with this.  Free T3, and T4 are mildly elevated,  but more likely related to early pregnancy. Discussed with patient that she will likely still have some n/v when she goes home, but as she is well hydrated and keeping most of her food down, she is stable for d/c from a hyperemesis point. Patient understands this. - DC IVF - continue prn phenergan/zofran - Continue vitamin B6 25mg  TID; doxylamine not available in hospital so benadryl prn instead  # Tachycardia: most likely secondary to hyperthyroidism as she appears clinically well hydrated. Improving w/ Methimazole - continue to monitor  # H/o hyperthyroidsim: Low TSH and mildly elevated free T3/T4. - our team has previously discussed with Dr. Erin Fulling (OB/GYN) who recommended recheck in 6 weeks [ ]  will likely need outpatient endocrine f/u  # Pregnancy - prenatal vitamin daily - has new OB appt at [facility] on 6/17 - will plan to recheck FHT prior to discharge, fetus nonviable at [redacted]w[redacted]d  # Trichomonas: s/p IV flagyl for tx - gc/chlamydia tested on 6/7 and were negative - have already educated pt re: need for partner treatment - need to advise patient to abstain for 14 days after both she and her partner are treated  # Possible UTI:  - urine cx no growth - can consider UTI prophylaxis as outpatient once establishes with [facility]  FEN/GI: SLIV, PO ad lib PPx: SQ heparin Dispo: DC today pending Fetal US to confirm fetal heart beat.  MERRELL, DAVID, MD Family Medicine Resident PGY-2 Pager: [phone] 09/30/2012, 8:12 AM

## 2012-10-02 NOTE — Discharge Summary (Signed)
Family Medicine Teaching [facility] Discharge Summary  Patient name: Grace [name] Medical record number: [id number] Date of birth: [birth year] Age: 27 y.o. Gender: female Date of Admission: 09/22/2012  Date of Discharge: 09/30/12 Admitting Physician: Carney Living, MD  Primary Care Provider: [facility], MD  Indication for Hospitalization: hyperemesis gravidarum  Discharge Diagnoses:  1. Hyperemesis gravidarum 2. Hypokalemia 3. Tachycardia 4. Hyperthyroidism 5. Pregnancy  Brief Hospital Course:  Grace [name] is a 27 y.o. female [phone] who was admitted to the hospital at [redacted]w[redacted]d for hyperemesis gravidarum.  # hyperemesis: was admitted, given IV fluids, with potassium repletion. Given zofran and phenergan prn nausea/vomiting. Also given pyrodoxine to treat hyperemesis. Slowly she improved and was able to tolerate PO.  # tachycardia: pt noted to be tachycardic during her hospitalization, as high as 130s at rest. This was thought to be due to her underlying untreated hyperthyroidism (undetectable TSH). EKGs persistently showed sinus tachycardia with PAC's. She was started on low dose labetalol for her symptomatic tachycardia. She will need f/u to further titrate her labetalol as needed. She did have an episode of chest pain on the night prior to discharge, and due to her persistent tachycardia we did cycle her cardiac enzymes to rule out heart strain. Her troponins were negative.  # hyperthyroidism: TSH undetectable when checked. Free T3 and T4 were actually normal. OB (Dr. Erin Fulling) was informally consulted via phone and recommended outpatient follow up, so we initially had planned not to treat her hyperthyroidism. However, she remained persistently and markedly tachycardic (up to 130s at rest) so after further consultation with OB (Dr. Macon Large) and endocrinology [facility] via phone) we did start her on methimazole for tx of hyperthyroidism. Thyroid  antibodies were checked and were still in process at time of discharge.  # trichomonas: noted on wet prep on admission. Treated with IV flagyl 2g. Recommended her partner be treated as well and that she abstain from intercourse until both she and her partner have been treated for 2 weeks.  # pregnancy: given prenatal vitamin. Fetal heart tones were intermittently auscultated using doppler. We did get an ultrasound to confirm heart tones on day of discharge. U/S was unremarkable (single living IUP). Pt will f/u with [facility] at [facility] hospital after dischawrge.  Consultations: OB (Dr. Erin Fulling and Dr. Macon Large, both via telephone), Endocrine [facility], via phone)  Procedures: none  Significant Labs and Imaging:   CBC  Lab  09/29/12 1649   WBC  8.1   HGB  11.2*   HCT  31.4*   PLT  322     BMET  Lab  09/24/12 0355  09/25/12 0545  09/27/12 0850   NA  137  136  137   K  3.1*  3.5  4.0   CL  108  106  103   CO2  23  22  24    BUN  <3*  <3*  4*   CREATININE  0.36*  0.40*  0.48*   GLUCOSE  93  84  107*   CALCIUM  8.1*  8.4  8.4    Mg 1.5  TSH <0.008  Free T3: 5.1  Free T4: 2.34  Thryoid antibodies in process at time of discharge   Discharge Medications:    Medication List    TAKE these medications       labetalol 100 MG tablet  Commonly known as:  NORMODYNE  Take 1 tablet (100 mg total) by mouth 2 (two) times daily.  methimazole 10 MG tablet  Commonly known as:  TAPAZOLE  Take 2 tablets (20 mg total) by mouth 2 (two) times daily.     ondansetron 4 MG tablet  Commonly known as:  ZOFRAN  Take 1 tablet (4 mg total) by mouth every 6 (six) hours.     promethazine 25 MG tablet  Commonly known as:  PHENERGAN  Take 1 tablet (25 mg total) by mouth every 6 (six) hours as needed for nausea.     pyridOXINE 25 MG tablet  Commonly known as:  VITAMIN B-6  Take 1 tablet (25 mg total) by mouth 3 (three) times daily.        Issues for Follow Up:  - pt needs  outpatient follow up to establish prenatal care with [facility] (has appointment scheduled for 6/17) due to active hyperthyroidism and hx of molar pregnancy - will also need f/u with endocrinology for hyperthyroidism on methimazole. Pt was given phone number for [facility] upon discharge, whom we spoke with on the phone while patient was hospitalized - consider TOC for trichomonas (has already been reinfected once after adequate treatment) - f/u thyroid antibodies - titrate labetalol as needed for continued tachycardia  Outstanding Results: thyroid antibodies      Follow-up Information   Follow up with [facility] On 10/03/2012. (at 2:00pm for OB new patient appointment)    Contact information:   [address] [place] [address] [phone]      Follow up with Romero Belling, MD. (Call to schedule an appointment to follow up on your thyroid (this is the endocrinologist). The appointment should be within 2 weeks.)    Contact information:   [address] [place] [address] [phone]       Discharge Condition: Stable  Discharge Disposition: Home  Discharge Instructions: Please refer to Patient Instructions section of EMR for full details.  Patient was counseled important signs and symptoms that should prompt return to medical care, changes in medications, dietary instructions, activity restrictions, and follow up appointments.    Levert Feinstein, MD Family Medicine PGY-1

## 2012-10-03 NOTE — Discharge Summary (Signed)
FMTS Attending Admission Note: Grace Eniola,MD I  have seen and examined this patient, reviewed their chart. I have discussed this patient with the resident. I agree with the resident's findings, assessment and care plan.  

## 2012-10-12 ENCOUNTER — Encounter: Payer: Self-pay | Admitting: Family

## 2012-10-14 ENCOUNTER — Inpatient Hospital Stay (HOSPITAL_COMMUNITY)
Admission: AD | Admit: 2012-10-14 | Discharge: 2012-10-14 | Disposition: A | Discharge status: Home / Self Care | Payer: Self-pay | Source: Ambulatory Visit | Attending: Obstetrics and Gynecology | Admitting: Obstetrics and Gynecology

## 2012-10-14 ENCOUNTER — Encounter (HOSPITAL_COMMUNITY): Payer: Self-pay | Admitting: *Deleted

## 2012-10-14 DIAGNOSIS — O10019 Pre-existing essential hypertension complicating pregnancy, unspecified trimester: Secondary | ICD-10-CM | POA: Insufficient documentation

## 2012-10-14 DIAGNOSIS — O210 Mild hyperemesis gravidarum: Secondary | ICD-10-CM

## 2012-10-14 DIAGNOSIS — E079 Disorder of thyroid, unspecified: Secondary | ICD-10-CM | POA: Insufficient documentation

## 2012-10-14 DIAGNOSIS — O99280 Endocrine, nutritional and metabolic diseases complicating pregnancy, unspecified trimester: Secondary | ICD-10-CM | POA: Insufficient documentation

## 2012-10-14 DIAGNOSIS — E0590 Thyrotoxicosis, unspecified without thyrotoxic crisis or storm: Secondary | ICD-10-CM | POA: Insufficient documentation

## 2012-10-14 DIAGNOSIS — R000 Tachycardia, unspecified: Secondary | ICD-10-CM | POA: Insufficient documentation

## 2012-10-14 DIAGNOSIS — O219 Vomiting of pregnancy, unspecified: Secondary | ICD-10-CM

## 2012-10-14 LAB — CBC
Hemoglobin: 12.5 g/dL (ref 12.0–15.0)
MCH: 30.9 pg (ref 26.0–34.0)
MCV: 84.7 fL (ref 78.0–100.0)
RBC: 4.04 MIL/uL (ref 3.87–5.11)
WBC: 10.6 10*3/uL — ABNORMAL HIGH (ref 4.0–10.5)

## 2012-10-14 LAB — URINE MICROSCOPIC-ADD ON

## 2012-10-14 LAB — URINALYSIS, ROUTINE W REFLEX MICROSCOPIC
Glucose, UA: NEGATIVE mg/dL
Nitrite: NEGATIVE
Protein, ur: 100 mg/dL — AB

## 2012-10-14 LAB — COMPREHENSIVE METABOLIC PANEL
Alkaline Phosphatase: 52 U/L (ref 39–117)
BUN: <3 mg/dL — ABNORMAL LOW (ref 6–23)
CO2: 21 mEq/L (ref 19–32)
Chloride: 100 mEq/L (ref 96–112)
GFR calc Af Amer: >90 mL/min (ref >90)
GFR calc non Af Amer: >90 mL/min (ref >90)
Glucose, Bld: 86 mg/dL (ref 70–99)
Potassium: 3 mEq/L — ABNORMAL LOW (ref 3.5–5.1)
Total Bilirubin: 0.5 mg/dL (ref 0.3–1.2)
Total Protein: 6.7 g/dL (ref 6.0–8.3)

## 2012-10-14 MED ORDER — LABETALOL HCL 100 MG PO TABS: 50.0000 mg | ORAL_TABLET | Freq: Two times a day (BID) | ORAL | Status: DC

## 2012-10-14 MED ORDER — ONDANSETRON HCL 4 MG/2ML IJ SOLN
4.0000 mg | Freq: Once | INTRAMUSCULAR | Status: AC
  Administered 2012-10-14: 4 mg via INTRAVENOUS
  Filled 2012-10-14: qty 2

## 2012-10-14 MED ORDER — PROMETHAZINE HCL 25 MG PO TABS: 25.0000 mg | ORAL_TABLET | Freq: Four times a day (QID) | ORAL | Status: DC | PRN

## 2012-10-14 MED ORDER — PROMETHAZINE HCL 25 MG/ML IJ SOLN
25.0000 mg | Freq: Once | INTRAVENOUS | Status: AC
  Administered 2012-10-14: 25 mg via INTRAVENOUS
  Filled 2012-10-14: qty 1

## 2012-10-14 MED ORDER — M.V.I. ADULT IV INJ
Freq: Once | INTRAVENOUS | Status: AC
  Administered 2012-10-14: 15:00:00 via INTRAVENOUS
  Filled 2012-10-14: qty 1000

## 2012-10-14 NOTE — MAU Provider Note (Signed)
History     CSN: [id number]  Arrival date and time: 10/14/12 1117   First Provider Initiated Contact with Patient 10/14/12 1159      Chief Complaint  Patient presents with  . Fatigue  . Abdominal Cramping  . Morning Sickness   HPI Grace [name] 27 y.o. [redacted]w[redacted]d   Comes to MAU with persistent vomiting for several days.   Is feeling bad.  Is a client of [facility].  Was hospitalized a couple of weeks ago at [facility] and was diagnosed with hyperthyroidism and HTN.  Started on meds for both.  Is now vomiting and has not been able to keep down fluids or her medications.  Also is out of Phenergan.  Previous chart notes reviewed.  OB History   Grav Para Term Preterm Abortions TAB SAB Ect Mult Living   4 2 2  1  1   2       Past Medical History  Diagnosis Date  . Sickle cell trait   . Headache(784.0)   . Hyperthyroidism   . Sickle cell trait   . Chronic kidney disease     recurrent UTIs  . GERD (gastroesophageal reflux disease)   . Cholecystitis 08/18/2011    Past Surgical History  Procedure Laterality Date  . Dilation and curettage of uterus    . Cholecystectomy  08/19/2011    Procedure: LAPAROSCOPIC CHOLECYSTECTOMY WITH INTRAOPERATIVE CHOLANGIOGRAM;  Surgeon: Lodema Pilot, DO;  Location: [facility] OR;  Service: General;  Laterality: N/A;    Family History  Problem Relation Age of Onset  . Diabetes Mother   . Cancer Father   . Heart disease Father   . Diabetes Maternal Uncle   . Cancer Maternal Grandmother   . Cancer Maternal Grandfather   . Hypertension Maternal Grandfather   . Diabetes Maternal Grandfather   . Sickle cell anemia Son   . Asthma Son   . Heart disease Paternal Grandfather     History  Substance Use Topics  . Smoking status: Never Smoker   . Smokeless tobacco: Never Used  . Alcohol Use: No    Allergies: No Known Allergies  Prescriptions prior to admission  Medication Sig Dispense Refill  . labetalol (NORMODYNE) 100 MG tablet Take 1 tablet (100 mg  total) by mouth 2 (two) times daily.  60 tablet  0  . methimazole (TAPAZOLE) 10 MG tablet Take 2 tablets (20 mg total) by mouth 2 (two) times daily.  120 tablet  3  . Prenatal Vit-Fe Fumarate-FA (PRENATAL MULTIVITAMIN) TABS Take 1 tablet by mouth daily at 12 noon.      . promethazine (PHENERGAN) 25 MG tablet Take 1 tablet (25 mg total) by mouth every 6 (six) hours as needed for nausea.  30 tablet  0  . pyridOXINE (VITAMIN B-6) 25 MG tablet Take 1 tablet (25 mg total) by mouth 3 (three) times daily.  90 tablet  3    Review of Systems  Constitutional: Positive for malaise/fatigue. Negative for fever.  Gastrointestinal: Positive for nausea and vomiting. Negative for abdominal pain, diarrhea and constipation.  Genitourinary:       No vaginal discharge. No vaginal bleeding. No dysuria.   Physical Exam   Blood pressure 122/81, pulse 132, temperature 98.9 F (37.2 C), temperature source Oral, resp. rate 18, last menstrual period 06/28/2012.  Physical Exam  Nursing note and vitals reviewed. Constitutional: She is oriented to person, place, and time. She appears well-developed and well-nourished. No distress.  Has repetitive spitting  HENT:  Head: Normocephalic.  Eyes: EOM are normal.  Neck: Neck supple.  GI: Soft. There is no tenderness. There is no rebound and no guarding.  FHT heard with doppler.  Musculoskeletal: Normal range of motion.  Neurological: She is alert and oriented to person, place, and time.  Skin: Skin is warm and dry.  Psychiatric: She has a normal mood and affect.    MAU Course  Procedures  MDM Results for orders placed during the hospital encounter of 10/14/12 (from the past 24 hour(s))  URINALYSIS, ROUTINE W REFLEX MICROSCOPIC     Status: Abnormal   Collection Time    10/14/12 11:25 AM      Result Value Range   Color, Urine YELLOW  YELLOW   APPearance CLEAR  CLEAR   Specific Gravity, Urine 1.025  1.005 - 1.030   pH 6.0  5.0 - 8.0   Glucose, UA NEGATIVE   NEGATIVE mg/dL   Hgb urine dipstick MODERATE (*) NEGATIVE   Bilirubin Urine SMALL (*) NEGATIVE   Ketones, ur >80 (*) NEGATIVE mg/dL   Protein, ur 161 (*) NEGATIVE mg/dL   Urobilinogen, UA 0.2  0.0 - 1.0 mg/dL   Nitrite NEGATIVE  NEGATIVE   Leukocytes, UA SMALL (*) NEGATIVE  URINE MICROSCOPIC-ADD ON     Status: Abnormal   Collection Time    10/14/12 11:25 AM      Result Value Range   Squamous Epithelial / LPF MANY (*) RARE   WBC, UA 3-6  <3 WBC/hpf   RBC / HPF 3-6  <3 RBC/hpf   Bacteria, UA RARE  RARE   Casts HYALINE CASTS (*) NEGATIVE   Urine-Other MUCOUS PRESENT    COMPREHENSIVE METABOLIC PANEL     Status: Abnormal   Collection Time    10/14/12 12:45 PM      Result Value Range   Sodium 135  135 - 145 mEq/L   Potassium 3.0 (*) 3.5 - 5.1 mEq/L   Chloride 100  96 - 112 mEq/L   CO2 21  19 - 32 mEq/L   Glucose, Bld 86  70 - 99 mg/dL   BUN <3 (*) 6 - 23 mg/dL   Creatinine, Ser 0.96 (*) 0.50 - 1.10 mg/dL   Calcium 9.4  8.4 - 04.5 mg/dL   Total Protein 6.7  6.0 - 8.3 g/dL   Albumin 3.0 (*) 3.5 - 5.2 g/dL   AST 14  0 - 37 U/L   ALT 7  0 - 35 U/L   Alkaline Phosphatase 52  39 - 117 U/L   Total Bilirubin 0.5  0.3 - 1.2 mg/dL   GFR calc non Af Amer >90  >90 mL/min   GFR calc Af Amer >90  >90 mL/min  CBC     Status: Abnormal   Collection Time    10/14/12 12:45 PM      Result Value Range   WBC 10.6 (*) 4.0 - 10.5 K/uL   RBC 4.04  3.87 - 5.11 MIL/uL   Hemoglobin 12.5  12.0 - 15.0 g/dL   HCT 40.9 (*) 81.1 - 91.4 %   MCV 84.7  78.0 - 100.0 fL   MCH 30.9  26.0 - 34.0 pg   MCHC 36.5 (*) 30.0 - 36.0 g/dL   RDW 78.2  95.6 - 21.3 %   Platelets 381  150 - 400 K/uL   Was given labetalol 100 mg PO bid for tachycardia.  Blood pressure is normal.  Wondering if labatelol is contributing to her feeling bad  or if it is only the vomiting and being unable to keep down thyroid medications.  Advised to only take 1/2 tablet of labatelol or to try not taking the labatelol as it is being used to  control tachycardia.  Did have HR of 120-135 on arrival to MAU today, but after 2 bags of fluids, HR is 90-105.  Client has kept down gingerale and graham crackers after Phenergan 25 mg in 1000ccLR and bag of multivitamins in D5LR.  Still stays her stomach hurts but no vomiting seen.  Assessment and Plan  Nausea and vomiting in pregnancy  Plan Message sent to [facility] as client does not yet have a second appointment - clinic was to call her to schedule and she has not received a call. Rx phenergan 25 mg one PO q 4-6 hours PRN for vomiting - discussed other medications, but client does not yet have Medicaid and is unable to afford a more expensive medication.  States that Zofran does not work.   Continue to take thyroid medications and stop or only take 1/2 tablet of labatelol since you are feeling bad.    Lada Fulbright 10/14/2012, 12:12 PM

## 2012-10-14 NOTE — Progress Notes (Signed)
Pt states she is not having pain now but cramping comes and goes

## 2012-10-14 NOTE — MAU Note (Signed)
Pt states she has been feeling weak for the past week, pt states she has not been able to keep anything down. Pt also says she has abdominal cramping. Pt states her heart is racing

## 2012-10-15 NOTE — MAU Provider Note (Signed)
Attestation of Attending Supervision of Advanced Practitioner (CNM/NP): Evaluation and management procedures were performed by the Advanced Practitioner under my supervision and collaboration.  I have reviewed the Advanced Practitioner's note and chart, and I agree with the management and plan.  Hye Trawick 10/15/2012 6:18 AM   

## 2012-10-26 ENCOUNTER — Ambulatory Visit (INDEPENDENT_AMBULATORY_CARE_PROVIDER_SITE_OTHER): Payer: Medicaid Other | Admitting: Obstetrics & Gynecology

## 2012-10-26 ENCOUNTER — Encounter: Payer: Self-pay | Admitting: Obstetrics & Gynecology

## 2012-10-26 VITALS — BP 115/85 | Temp 97.0°F | Wt 158.2 lb

## 2012-10-26 DIAGNOSIS — O211 Hyperemesis gravidarum with metabolic disturbance: Secondary | ICD-10-CM

## 2012-10-26 DIAGNOSIS — O0992 Supervision of high risk pregnancy, unspecified, second trimester: Secondary | ICD-10-CM

## 2012-10-26 DIAGNOSIS — E079 Disorder of thyroid, unspecified: Secondary | ICD-10-CM

## 2012-10-26 DIAGNOSIS — O99891 Other specified diseases and conditions complicating pregnancy: Secondary | ICD-10-CM

## 2012-10-26 LAB — HIV ANTIBODY (ROUTINE TESTING W REFLEX): HIV: NONREACTIVE

## 2012-10-26 LAB — POCT URINALYSIS DIP (DEVICE)
Glucose, UA: NEGATIVE mg/dL
Ketones, ur: 15 mg/dL — AB
Specific Gravity, Urine: 1.015 (ref 1.005–1.030)
Urobilinogen, UA: 1 mg/dL (ref 0.0–1.0)

## 2012-10-26 MED ORDER — RANITIDINE HCL 150 MG PO CAPS: 150.0000 mg | ORAL_CAPSULE | Freq: Two times a day (BID) | ORAL | Status: DC

## 2012-10-26 NOTE — Progress Notes (Signed)
Pulse: 111 Needs refill on phenergan

## 2012-10-26 NOTE — Progress Notes (Signed)
Faxed labs and office/ER notes to [facility] to fax # [phone] via Epic. [facility] will contact pt with appt.   Called pt and informed her that [facility] will contact her with an appt. Pt stated understanding.

## 2012-10-26 NOTE — Progress Notes (Signed)
Feels very tired. Still with nausea, also significant reflux. Will Rx Zantac 150 BID. Needs endocrine f/u for hyperthyroidism

## 2012-10-26 NOTE — Patient Instructions (Addendum)
Heartburn During Pregnancy  Heartburn is a burning sensation in the chest caused by stomach acid backing up into the esophagus. Heartburn (also known as "reflux") is common in pregnancy because a certain hormone (progesterone) changes. The progesterone hormone may relax the valve that separates the esophagus from the stomach. This allows acid to go up into the esophagus, causing heartburn. Heartburn may also happen in pregnancy because the enlarging uterus pushes up on the stomach, which pushes more acid into the esophagus. This is especially true in the later stages of pregnancy. Heartburn problems usually go away after giving birth. CAUSES   The progesterone hormone.  Changing hormone levels.  The growing uterus that pushes stomach acid upward.  Large meals.  Certain foods and drinks.  Exercise.  Increased acid production. SYMPTOMS   Burning pain in the chest or lower throat.  Bitter taste in the mouth.  Coughing. DIAGNOSIS  Heartburn is typically diagnosed by your caregiver when taking a careful history of your concern. Your caregiver may order a blood test to check for a certain type of bacteria that is associated with heartburn. Sometimes, heartburn is diagnosed by prescribing a heartburn medicine to see if the symptoms improve. It is rare in pregnancy to have a procedure called an endoscopy. This is when a tube with a light and a camera on the end is used to examine the esophagus and the stomach. TREATMENT   Your caregiver may tell you to use certain over-the-counter medicines (antacids, acid reducers) for mild heartburn.  Your caregiver may prescribe medicines to decrease stomach acid or to protect your stomach lining.  Your caregiver may recommend certain diet changes.  For severe cases, your caregiver may recommend that the head of the bed be elevated on blocks. (Sleeping with more pillows is not an effective treatment as it only changes the position of your head and does  not improve the main problem of stomach acid refluxing into the esophagus.) HOME CARE INSTRUCTIONS   Take all medicines as directed by your caregiver.  Raise the head of your bed by putting blocks under the legs if instructed to by your caregiver.  Do not exercise right after eating.  Avoid eating 2 or 3 hours before bed. Do not lie down right after eating.  Eat small meals throughout the day instead of 3 large meals.  Identify foods and beverages that make your symptoms worse and avoid them. Foods you may want to avoid include:  Peppers.  Chocolate.  High-fat foods, including fried foods.  Spicy foods.  Garlic and onions.  Citrus fruits, including oranges, grapefruit, lemons, and limes.  Food containing tomatoes or tomato products.  Mint.  Carbonated and caffeinated drinks.  Vinegar. SEEK IMMEDIATE MEDICAL CARE IF:   You have severe chest pain that goes down your arm or into your jaw or neck.  You feel sweaty, dizzy, or lightheaded.  You become short of breath.  You vomit blood.  You have difficulty or pain with swallowing.  You have bloody or black, tarry stools.  You have episodes of heartburn more than 3 times a week, for more than 2 weeks. MAKE SURE YOU:  Understand these instructions.  Will watch your condition.  Will get help right away if you are not doing well or get worse. Document Released: 04/02/2000 Document Revised: 06/28/2011 Document Reviewed: 09/24/2010 [facility] Patient Information 2014 [place], [place].

## 2012-10-27 LAB — OBSTETRIC PANEL
Antibody Screen: NEGATIVE
Eosinophils Absolute: 0.1 10*3/uL (ref 0.0–0.7)
Eosinophils Relative: 2 % (ref 0–5)
HCT: 33.1 % — ABNORMAL LOW (ref 36.0–46.0)
Lymphocytes Relative: 23 % (ref 12–46)
Lymphs Abs: 1.7 10*3/uL (ref 0.7–4.0)
MCH: 30.3 pg (ref 26.0–34.0)
MCV: 86.4 fL (ref 78.0–100.0)
Monocytes Absolute: 0.6 10*3/uL (ref 0.1–1.0)
Monocytes Relative: 9 % (ref 3–12)
Platelets: 392 10*3/uL (ref 150–400)
RBC: 3.83 MIL/uL — ABNORMAL LOW (ref 3.87–5.11)
Rh Type: POSITIVE
Rubella: 3.14 Index — ABNORMAL HIGH (ref <0.90)

## 2012-11-09 ENCOUNTER — Encounter: Payer: Self-pay | Admitting: Obstetrics & Gynecology

## 2012-11-09 ENCOUNTER — Ambulatory Visit (HOSPITAL_COMMUNITY): Payer: MEDICAID

## 2012-11-14 ENCOUNTER — Ambulatory Visit (HOSPITAL_COMMUNITY)
Admission: RE | Admit: 2012-11-14 | Discharge: 2012-11-14 | Disposition: A | Discharge status: Home / Self Care | Payer: Medicaid Other | Source: Ambulatory Visit | Attending: Obstetrics & Gynecology | Admitting: Obstetrics & Gynecology

## 2012-11-14 DIAGNOSIS — Z1389 Encounter for screening for other disorder: Secondary | ICD-10-CM | POA: Insufficient documentation

## 2012-11-14 DIAGNOSIS — O211 Hyperemesis gravidarum with metabolic disturbance: Secondary | ICD-10-CM

## 2012-11-14 DIAGNOSIS — O358XX0 Maternal care for other (suspected) fetal abnormality and damage, not applicable or unspecified: Secondary | ICD-10-CM | POA: Insufficient documentation

## 2012-11-14 DIAGNOSIS — O0992 Supervision of high risk pregnancy, unspecified, second trimester: Secondary | ICD-10-CM

## 2012-11-14 DIAGNOSIS — Z363 Encounter for antenatal screening for malformations: Secondary | ICD-10-CM | POA: Insufficient documentation

## 2012-11-14 DIAGNOSIS — A599 Trichomoniasis, unspecified: Secondary | ICD-10-CM

## 2012-11-23 ENCOUNTER — Ambulatory Visit (INDEPENDENT_AMBULATORY_CARE_PROVIDER_SITE_OTHER): Payer: Medicaid Other | Admitting: Obstetrics and Gynecology

## 2012-11-23 ENCOUNTER — Encounter: Payer: Self-pay | Admitting: Obstetrics and Gynecology

## 2012-11-23 VITALS — BP 112/73 | Temp 98.2°F | Wt 161.0 lb

## 2012-11-23 DIAGNOSIS — O99280 Endocrine, nutritional and metabolic diseases complicating pregnancy, unspecified trimester: Secondary | ICD-10-CM

## 2012-11-23 DIAGNOSIS — E079 Disorder of thyroid, unspecified: Secondary | ICD-10-CM

## 2012-11-23 DIAGNOSIS — O99282 Endocrine, nutritional and metabolic diseases complicating pregnancy, second trimester: Secondary | ICD-10-CM

## 2012-11-23 DIAGNOSIS — N898 Other specified noninflammatory disorders of vagina: Secondary | ICD-10-CM

## 2012-11-23 DIAGNOSIS — O0990 Supervision of high risk pregnancy, unspecified, unspecified trimester: Secondary | ICD-10-CM

## 2012-11-23 LAB — POCT URINALYSIS DIP (DEVICE)
Glucose, UA: NEGATIVE mg/dL
Nitrite: NEGATIVE
Protein, ur: 30 mg/dL — AB
Urobilinogen, UA: 0.2 mg/dL (ref 0.0–1.0)

## 2012-11-23 NOTE — Progress Notes (Signed)
Spoke with Dr.Balan's office - [facility] phone [phone] fax [phone].  Patient has not been seen in their office before.  Will fax records to their office and Dr. Talmage Nap will review and determine an appointment for the patient.  We will be notified once an appointment date and time has been established.

## 2012-11-23 NOTE — Progress Notes (Signed)
Pulse 99 C/o pelvic pressure. States slight odor on vaginal d/c; no itch.

## 2012-11-23 NOTE — Progress Notes (Signed)
Yellow vaginal D/C> [facility] sent. . Denies sx hyperthyroid and on meds as directed. No tachycardia. Has not seen endocrinologist yet> K. Rassette to reschedule. RLP discussed

## 2012-11-23 NOTE — Patient Instructions (Signed)

## 2012-11-24 LAB — WET PREP, GENITAL: Trich, Wet Prep: NONE SEEN

## 2012-12-21 ENCOUNTER — Encounter: Payer: Self-pay | Admitting: Family

## 2012-12-27 ENCOUNTER — Inpatient Hospital Stay (HOSPITAL_COMMUNITY)
Admission: AD | Admit: 2012-12-27 | Discharge: 2012-12-27 | Disposition: A | Discharge status: Home / Self Care | Payer: Medicaid Other | Source: Ambulatory Visit | Attending: Obstetrics & Gynecology | Admitting: Obstetrics & Gynecology

## 2012-12-27 ENCOUNTER — Encounter (HOSPITAL_COMMUNITY): Payer: Self-pay

## 2012-12-27 DIAGNOSIS — R109 Unspecified abdominal pain: Secondary | ICD-10-CM

## 2012-12-27 DIAGNOSIS — M549 Dorsalgia, unspecified: Secondary | ICD-10-CM | POA: Insufficient documentation

## 2012-12-27 DIAGNOSIS — A5901 Trichomonal vulvovaginitis: Secondary | ICD-10-CM | POA: Insufficient documentation

## 2012-12-27 DIAGNOSIS — O212 Late vomiting of pregnancy: Secondary | ICD-10-CM | POA: Insufficient documentation

## 2012-12-27 DIAGNOSIS — O98819 Other maternal infectious and parasitic diseases complicating pregnancy, unspecified trimester: Secondary | ICD-10-CM | POA: Insufficient documentation

## 2012-12-27 DIAGNOSIS — A599 Trichomoniasis, unspecified: Secondary | ICD-10-CM

## 2012-12-27 DIAGNOSIS — O0990 Supervision of high risk pregnancy, unspecified, unspecified trimester: Secondary | ICD-10-CM

## 2012-12-27 LAB — CBC WITH DIFFERENTIAL/PLATELET
Basophils Absolute: 0 10*3/uL (ref 0.0–0.1)
Basophils Relative: 0 % (ref 0–1)
Eosinophils Absolute: 0 10*3/uL (ref 0.0–0.7)
Eosinophils Relative: 0 % (ref 0–5)
HCT: 28.8 % — ABNORMAL LOW (ref 36.0–46.0)
MCHC: 34.4 g/dL (ref 30.0–36.0)
MCV: 85.5 fL (ref 78.0–100.0)
Monocytes Absolute: 1.1 10*3/uL — ABNORMAL HIGH (ref 0.1–1.0)
Platelets: 321 10*3/uL (ref 150–400)
RDW: 13 % (ref 11.5–15.5)
WBC: 13.1 10*3/uL — ABNORMAL HIGH (ref 4.0–10.5)

## 2012-12-27 LAB — URINALYSIS, ROUTINE W REFLEX MICROSCOPIC
Glucose, UA: NEGATIVE mg/dL
Ketones, ur: 15 mg/dL — AB
Protein, ur: NEGATIVE mg/dL
pH: 6 (ref 5.0–8.0)

## 2012-12-27 LAB — WET PREP, GENITAL
Trich, Wet Prep: NONE SEEN
Yeast Wet Prep HPF POC: NONE SEEN

## 2012-12-27 LAB — URINE MICROSCOPIC-ADD ON

## 2012-12-27 MED ORDER — CEPHALEXIN 500 MG PO CAPS: 500.0000 mg | ORAL_CAPSULE | Freq: Four times a day (QID) | ORAL | Status: DC

## 2012-12-27 MED ORDER — METRONIDAZOLE 500 MG PO TABS
2000.0000 mg | ORAL_TABLET | Freq: Once | ORAL | Status: AC
  Administered 2012-12-27: 2000 mg via ORAL
  Filled 2012-12-27: qty 4

## 2012-12-27 MED ORDER — FERROUS SULFATE 325 (65 FE) MG PO TABS: 325.0000 mg | ORAL_TABLET | Freq: Every day | ORAL | Status: DC

## 2012-12-27 MED ORDER — CEFTRIAXONE SODIUM 1 G IJ SOLR
1.0000 g | Freq: Once | INTRAMUSCULAR | Status: AC
  Administered 2012-12-27: 1 g via INTRAMUSCULAR
  Filled 2012-12-27: qty 10

## 2012-12-27 NOTE — MAU Provider Note (Signed)
Chief Complaint:  Back Pain and Nausea   Grace [name] is a 27 y.o.  [phone] with IUP at [redacted]w[redacted]d presenting for Back Pain and Nausea  Pt presents today with back pain that started last night around 7pm on the right side but has since migrated and is now bilateral. Aching feeling in lower back. Today has seemed to worsen some to a 10/10.  States it is a constant pain associated with some nausea. +urinary frequency. No hematuria, or dysuria. No vomiting, fevers, chills, or diarrhea. Pt states it feels similar to when she had pyelonephritis with her last pregnancy.   Receives [facility] at [facility] for a hx of graves on methimazole.  Pregnancy has been complicated by hyperemesis gravidarum and trichomonas. Otherwise uncomplicated.      Menstrual History: OB History   Grav Para Term Preterm Abortions TAB SAB Ect Mult Living   4 2 2  1  1   2        Patient's last menstrual period was 06/28/2012.      Past Medical History  Diagnosis Date  . Sickle cell trait   . Headache(784.0)   . Hyperthyroidism   . Sickle cell trait   . Chronic kidney disease     recurrent UTIs  . GERD (gastroesophageal reflux disease)   . Cholecystitis 08/18/2011    Past Surgical History  Procedure Laterality Date  . Dilation and curettage of uterus    . Cholecystectomy  08/19/2011    Procedure: LAPAROSCOPIC CHOLECYSTECTOMY WITH INTRAOPERATIVE CHOLANGIOGRAM;  Surgeon: Lodema Pilot, DO;  Location: [facility] OR;  Service: General;  Laterality: N/A;    Family History  Problem Relation Age of Onset  . Diabetes Mother   . Cancer Father   . Heart disease Father   . Diabetes Maternal Uncle   . Cancer Maternal Grandmother   . Cancer Maternal Grandfather   . Hypertension Maternal Grandfather   . Diabetes Maternal Grandfather   . Sickle cell anemia Son   . Asthma Son   . Heart disease Paternal Grandfather     History  Substance Use Topics  . Smoking status: Never Smoker   . Smokeless tobacco: Never Used  . Alcohol Use:  No     No Known Allergies  Prescriptions prior to admission  Medication Sig Dispense Refill  . labetalol (NORMODYNE) 100 MG tablet Take 0.5 tablets (50 mg total) by mouth 2 (two) times daily.  60 tablet  0  . methimazole (TAPAZOLE) 10 MG tablet Take 2 tablets (20 mg total) by mouth 2 (two) times daily.  120 tablet  3  . Prenatal Vit-Fe Fumarate-FA (PRENATAL MULTIVITAMIN) TABS Take 1 tablet by mouth daily at 12 noon.      . promethazine (PHENERGAN) 25 MG tablet Take 1 tablet (25 mg total) by mouth every 6 (six) hours as needed for nausea.  30 tablet  0  . promethazine (PHENERGAN) 25 MG tablet Take 1 tablet (25 mg total) by mouth every 6 (six) hours as needed for nausea.  30 tablet  0  . pyridOXINE (VITAMIN B-6) 25 MG tablet Take 1 tablet (25 mg total) by mouth 3 (three) times daily.  90 tablet  3  . ranitidine (ZANTAC) 150 MG capsule Take 1 capsule (150 mg total) by mouth 2 (two) times daily.  60 capsule  2    Review of Systems - Negative except for what is mentioned in HPI.  Physical Exam  Blood pressure 123/78, pulse 127, temperature 99.1 F (37.3 C), temperature source  Oral, resp. rate 16, height 5' 3.5" (1.613 m), weight 77.202 kg (170 lb 3.2 oz), last menstrual period 06/28/2012, SpO2 100.00%. GENERAL: Well-developed, well-nourished female in no acute distress.  LUNGS: Clear to auscultation bilaterally.  HEART: Regular rate and rhythm. ABDOMEN: Soft, nontender, nondistended, gravid.  BACK: no CVA tenderness but mild low back tenderness EXTREMITIES: Nontender, no edema, 2+ distal pulses. SSE: normal vulva, labia, urethra. Vaginal vault with copious frothy discharge. Cervix slightly friable appearing. Slight CMT. No adnexal tenderness.  Cervical Exam: Dilatation 0cm   Effacement 0%   Station high   FHT:  Baseline rate 150 bpm   Variability moderate  Accelerations present   Decelerations none Contractions: none   Labs: Results for orders placed during the hospital encounter of  12/27/12 (from the past 24 hour(s))  URINALYSIS, ROUTINE W REFLEX MICROSCOPIC   Collection Time    12/27/12  6:55 PM      Result Value Range   Color, Urine YELLOW  YELLOW   APPearance CLEAR  CLEAR   Specific Gravity, Urine 1.010  1.005 - 1.030   pH 6.0  5.0 - 8.0   Glucose, UA NEGATIVE  NEGATIVE mg/dL   Hgb urine dipstick TRACE (*) NEGATIVE   Bilirubin Urine NEGATIVE  NEGATIVE   Ketones, ur 15 (*) NEGATIVE mg/dL   Protein, ur NEGATIVE  NEGATIVE mg/dL   Urobilinogen, UA 0.2  0.0 - 1.0 mg/dL   Nitrite NEGATIVE  NEGATIVE   Leukocytes, UA LARGE (*) NEGATIVE  URINE MICROSCOPIC-ADD ON   Collection Time    12/27/12  6:55 PM      Result Value Range   Squamous Epithelial / LPF RARE  RARE   WBC, UA 21-50  <3 WBC/hpf   RBC / HPF 3-6  <3 RBC/hpf   Bacteria, UA MANY (*) RARE   Urine-Other TRICHOMONAS PRESENT      Imaging Studies:  No results found.  Assessment: Grace [name] is  27 y.o. [phone] at [redacted]w[redacted]d presents with Back Pain and Nausea  Doubt pyelonephritis in the setting of normal temp, no vomiting and UA with no nitrites. UA showing trichomonas on microscopy.  Pt admits that partner may not have been treated fully after her last infection.  Exam consistent with trichomonas also. Suspect symptoms are due to this although UTI is also possible.   .  Plan: - send Urine cx - given dose of flagyl 2gm here for trichomonas - rocephin 1gm IM given also - will treat with keflex prophylactic for UTI pending results of culture.  - discussed return precautions including fever, vomiting, or worsening pain.  - f/u as scheduled next week for routine OBV.     [name] 9/10/20147:48 PM

## 2012-12-27 NOTE — MAU Note (Signed)
Patient states she has been having constant low back pain since last night. Has nausea but no vomiting. Denies bleeding or leaking and reports good fetal movement.

## 2012-12-28 LAB — GC/CHLAMYDIA PROBE AMP: GC Probe RNA: NEGATIVE

## 2012-12-29 LAB — URINE CULTURE: Colony Count: >=100000

## 2013-01-04 ENCOUNTER — Encounter: Payer: Self-pay | Admitting: Obstetrics & Gynecology

## 2013-01-18 ENCOUNTER — Ambulatory Visit (INDEPENDENT_AMBULATORY_CARE_PROVIDER_SITE_OTHER): Payer: Medicaid Other | Admitting: Obstetrics & Gynecology

## 2013-01-18 VITALS — BP 117/79 | Temp 97.1°F | Wt 172.6 lb

## 2013-01-18 DIAGNOSIS — E079 Disorder of thyroid, unspecified: Secondary | ICD-10-CM

## 2013-01-18 DIAGNOSIS — K219 Gastro-esophageal reflux disease without esophagitis: Secondary | ICD-10-CM

## 2013-01-18 DIAGNOSIS — O0993 Supervision of high risk pregnancy, unspecified, third trimester: Secondary | ICD-10-CM

## 2013-01-18 DIAGNOSIS — Z23 Encounter for immunization: Secondary | ICD-10-CM

## 2013-01-18 DIAGNOSIS — D573 Sickle-cell trait: Secondary | ICD-10-CM

## 2013-01-18 LAB — POCT URINALYSIS DIP (DEVICE)
Glucose, UA: NEGATIVE mg/dL
Nitrite: NEGATIVE
Protein, ur: NEGATIVE mg/dL
Urobilinogen, UA: 0.2 mg/dL (ref 0.0–1.0)

## 2013-01-18 LAB — CBC
HCT: 29.7 % — ABNORMAL LOW (ref 36.0–46.0)
MCHC: 33 g/dL (ref 30.0–36.0)
Platelets: 358 10*3/uL (ref 150–400)
RDW: 13.8 % (ref 11.5–15.5)
WBC: 8.7 10*3/uL (ref 4.0–10.5)

## 2013-01-18 MED ORDER — PANTOPRAZOLE SODIUM 40 MG PO TBEC: 40.0000 mg | DELAYED_RELEASE_TABLET | Freq: Every day | ORAL | Status: DC

## 2013-01-18 MED ORDER — DOXYLAMINE SUCCINATE (SLEEP) 25 MG PO TABS: 25.0000 mg | ORAL_TABLET | Freq: Every evening | ORAL | Status: DC | PRN

## 2013-01-18 MED ORDER — INFLUENZA VIRUS VACC SPLIT PF IM SUSP
0.5000 mL | Freq: Once | INTRAMUSCULAR | Status: AC
  Administered 2013-01-18: 0.5 mL via INTRAMUSCULAR

## 2013-01-18 NOTE — Progress Notes (Signed)
P= 97 Pt. Reports intermittent lower abdominal/pelvic pain. Unsure about whether she wants the flu vaccine- information sheet given; pt. Will decide before she leaves today.  Glucola given for 1hr glucose.

## 2013-01-18 NOTE — Patient Instructions (Signed)
Heartburn During Pregnancy   Heartburn is a burning sensation in the chest caused by stomach acid backing up into the esophagus. Heartburn (also known as "reflux") is common in pregnancy because a certain hormone (progesterone) changes. The progesterone hormone may relax the valve that separates the esophagus from the stomach. This allows acid to go up into the esophagus, causing heartburn. Heartburn may also happen in pregnancy because the enlarging uterus pushes up on the stomach, which pushes more acid into the esophagus. This is especially true in the later stages of pregnancy. Heartburn problems usually go away after giving birth.  CAUSES   · The progesterone hormone.  · Changing hormone levels.  · The growing uterus that pushes stomach acid upward.  · Large meals.  · Certain foods and drinks.  · Exercise.  · Increased acid production.  SYMPTOMS   · Burning pain in the chest or lower throat.  · Bitter taste in the mouth.  · Coughing.  DIAGNOSIS   Heartburn is typically diagnosed by your caregiver when taking a careful history of your concern. Your caregiver may order a blood test to check for a certain type of bacteria that is associated with heartburn. Sometimes, heartburn is diagnosed by prescribing a heartburn medicine to see if the symptoms improve. It is rare in pregnancy to have a procedure called an endoscopy. This is when a tube with a light and a camera on the end is used to examine the esophagus and the stomach.  TREATMENT   · Your caregiver may tell you to use certain over-the-counter medicines (antacids, acid reducers) for mild heartburn.  · Your caregiver may prescribe medicines to decrease stomach acid or to protect your stomach lining.  · Your caregiver may recommend certain diet changes.  · For severe cases, your caregiver may recommend that the head of the bed be elevated on blocks. (Sleeping with more pillows is not an effective treatment as it only changes the position of your head and does  not improve the main problem of stomach acid refluxing into the esophagus.)  HOME CARE INSTRUCTIONS   · Take all medicines as directed by your caregiver.  · Raise the head of your bed by putting blocks under the legs if instructed to by your caregiver.  · Do not exercise right after eating.  · Avoid eating 2 or 3 hours before bed. Do not lie down right after eating.  · Eat small meals throughout the day instead of 3 large meals.  · Identify foods and beverages that make your symptoms worse and avoid them. Foods you may want to avoid include:  · Peppers.  · Chocolate.  · High-fat foods, including fried foods.  · Spicy foods.  · Garlic and onions.  · Citrus fruits, including oranges, grapefruit, lemons, and limes.  · Food containing tomatoes or tomato products.  · Mint.  · Carbonated and caffeinated drinks.  · Vinegar.  SEEK IMMEDIATE MEDICAL CARE IF:   · You have severe chest pain that goes down your arm or into your jaw or neck.  · You feel sweaty, dizzy, or lightheaded.  · You become short of breath.  · You vomit blood.  · You have difficulty or pain with swallowing.  · You have bloody or black, tarry stools.  · You have episodes of heartburn more than 3 times a week, for more than 2 weeks.  MAKE SURE YOU:  · Understand these instructions.  · Will watch your condition.  · Will get 

## 2013-01-18 NOTE — Progress Notes (Signed)
Change to Protonix for reflux sx. Unisom for sleep.

## 2013-01-19 LAB — GLUCOSE TOLERANCE, 1 HOUR (50G) W/O FASTING: Glucose, 1 Hour GTT: 150 mg/dL — ABNORMAL HIGH (ref 70–140)

## 2013-01-19 LAB — RPR

## 2013-01-23 ENCOUNTER — Other Ambulatory Visit: Payer: Self-pay

## 2013-01-23 ENCOUNTER — Encounter: Payer: Self-pay | Admitting: Obstetrics and Gynecology

## 2013-01-26 ENCOUNTER — Encounter: Payer: Self-pay | Admitting: *Deleted

## 2013-02-01 ENCOUNTER — Encounter: Payer: Self-pay | Admitting: Obstetrics & Gynecology

## 2013-02-05 LAB — OB RESULTS CONSOLE GC/CHLAMYDIA: Chlamydia: NEGATIVE

## 2013-02-08 ENCOUNTER — Encounter: Payer: Self-pay | Admitting: *Deleted

## 2013-02-08 ENCOUNTER — Encounter: Payer: Self-pay | Admitting: Obstetrics & Gynecology

## 2013-02-24 ENCOUNTER — Encounter (HOSPITAL_COMMUNITY): Payer: Self-pay | Admitting: *Deleted

## 2013-02-24 ENCOUNTER — Inpatient Hospital Stay (HOSPITAL_COMMUNITY)
Admission: AD | Admit: 2013-02-24 | Discharge: 2013-02-25 | Disposition: A | Discharge status: Home / Self Care | Payer: Medicaid Other | Source: Ambulatory Visit | Attending: Obstetrics & Gynecology | Admitting: Obstetrics & Gynecology

## 2013-02-24 DIAGNOSIS — R109 Unspecified abdominal pain: Secondary | ICD-10-CM | POA: Insufficient documentation

## 2013-02-24 DIAGNOSIS — O4700 False labor before 37 completed weeks of gestation, unspecified trimester: Secondary | ICD-10-CM | POA: Insufficient documentation

## 2013-02-24 DIAGNOSIS — O99891 Other specified diseases and conditions complicating pregnancy: Secondary | ICD-10-CM | POA: Insufficient documentation

## 2013-02-24 DIAGNOSIS — R51 Headache: Secondary | ICD-10-CM | POA: Insufficient documentation

## 2013-02-24 DIAGNOSIS — N859 Noninflammatory disorder of uterus, unspecified: Secondary | ICD-10-CM

## 2013-02-24 LAB — URINALYSIS, ROUTINE W REFLEX MICROSCOPIC
Bilirubin Urine: NEGATIVE
Ketones, ur: NEGATIVE mg/dL
Nitrite: NEGATIVE
Protein, ur: NEGATIVE mg/dL
pH: 6 (ref 5.0–8.0)

## 2013-02-24 MED ORDER — NIFEDIPINE 10 MG PO CAPS
10.0000 mg | ORAL_CAPSULE | Freq: Once | ORAL | Status: AC
  Administered 2013-02-24: 10 mg via ORAL
  Filled 2013-02-24: qty 1

## 2013-02-24 MED ORDER — TRAMADOL HCL 50 MG PO TABS: 50.0000 mg | ORAL_TABLET | Freq: Four times a day (QID) | ORAL | Status: DC | PRN

## 2013-02-24 MED ORDER — ACETAMINOPHEN 325 MG PO TABS
650.0000 mg | ORAL_TABLET | Freq: Four times a day (QID) | ORAL | Status: DC | PRN
  Administered 2013-02-24: 650 mg via ORAL
  Filled 2013-02-24: qty 2

## 2013-02-24 MED ORDER — OXYCODONE-ACETAMINOPHEN 5-325 MG PO TABS: 1.0000 | ORAL_TABLET | ORAL | Status: DC | PRN

## 2013-02-24 NOTE — MAU Note (Signed)
I've been having pressure like everything is going to fall out when I stand. Hurts to walk. No bleeding or d/c

## 2013-02-24 NOTE — MAU Note (Signed)
PT SAYS  SHE FEELS PRESSURE/ PAIN IN LOWER ABD   WHEN SHE STANDS - STARTED YESTERDAY-  BUT TODAY -IT HAS BEEN ALL DAY.   GETS [facility]- AT [facility]-   HAS HYPERTHYROIDISM-  WAS SEEN  LAST 10-20- HAD GLUCOSE TEST- DONE-  SO HAS TO GO BACK ON 11-20 FOR A FASTING  BS.     LAST SEX-  SEPT .      DENIES HSV AND MRSA.

## 2013-02-24 NOTE — MAU Provider Note (Signed)
History     CSN: [id number]  Arrival date and time: 02/24/13 1916   None     Chief Complaint  Patient presents with  . Abdominal Pain   HPI CS is a 27 yo G4P2012 at [redacted]w[redacted]d presenting to MAU for pressure/pain in lower abdomen.  The pain is only when standing, but the pressure is constant. She states that symptoms started yesterday. Patient denies LOF and VB.  +FM   Past Medical History  Diagnosis Date  . Sickle cell trait   . Headache(784.0)   . Hyperthyroidism   . Sickle cell trait   . Chronic kidney disease     recurrent UTIs  . GERD (gastroesophageal reflux disease)   . Cholecystitis 08/18/2011    Past Surgical History  Procedure Laterality Date  . Dilation and curettage of uterus    . Cholecystectomy  08/19/2011    Procedure: LAPAROSCOPIC CHOLECYSTECTOMY WITH INTRAOPERATIVE CHOLANGIOGRAM;  Surgeon: Lodema Pilot, DO;  Location: [facility] OR;  Service: General;  Laterality: N/A;    Family History  Problem Relation Age of Onset  . Diabetes Mother   . Cancer Father   . Heart disease Father   . Diabetes Maternal Uncle   . Cancer Maternal Grandmother   . Cancer Maternal Grandfather   . Hypertension Maternal Grandfather   . Diabetes Maternal Grandfather   . Sickle cell anemia Son   . Asthma Son   . Heart disease Paternal Grandfather     History  Substance Use Topics  . Smoking status: Never Smoker   . Smokeless tobacco: Never Used  . Alcohol Use: No    Allergies: No Known Allergies  Prescriptions prior to admission  Medication Sig Dispense Refill  . ferrous sulfate (FERROUSUL) 325 (65 FE) MG tablet Take 1 tablet (325 mg total) by mouth daily with breakfast.  60 tablet  1  . labetalol (NORMODYNE) 100 MG tablet Take 0.5 tablets (50 mg total) by mouth 2 (two) times daily.  60 tablet  0  . methimazole (TAPAZOLE) 10 MG tablet Take 2 tablets (20 mg total) by mouth 2 (two) times daily.  120 tablet  3  . pantoprazole (PROTONIX) 40 MG tablet Take 1 tablet (40 mg total) by  mouth daily.  30 tablet  3  . Prenatal Vit-Fe Fumarate-FA (PRENATAL MULTIVITAMIN) TABS Take 1 tablet by mouth daily at 12 noon.      . pyridOXINE (VITAMIN B-6) 25 MG tablet Take 1 tablet (25 mg total) by mouth 3 (three) times daily.  90 tablet  3  . ranitidine (ZANTAC) 150 MG tablet Take 150 mg by mouth as needed for heartburn.        Review of Systems  Constitutional: Negative.   HENT: Negative.   Eyes: Negative.   Respiratory: Negative.   Cardiovascular: Negative.   Gastrointestinal: Positive for abdominal pain.  Genitourinary: Negative.   Musculoskeletal: Positive for joint pain.  Skin: Negative.   Neurological: Negative.   Endo/Heme/Allergies: Negative.   Psychiatric/Behavioral: Negative.    Physical Exam   Blood pressure 136/85, pulse 94, temperature 99 F (37.2 C), resp. rate 18, height 5\' 4"  (1.626 m), weight 83.099 kg (183 lb 3.2 oz), last menstrual period 06/28/2012.  Physical Exam  Constitutional: She is oriented to person, place, and time. She appears well-developed and well-nourished.  HENT:  Head: Normocephalic and atraumatic.  Eyes: Conjunctivae are normal.  Neck: Normal range of motion. Neck supple.  Cardiovascular: Normal rate, regular rhythm and normal heart sounds.   Respiratory: Effort normal  and breath sounds normal.  GI: Soft.  Genitourinary:  Uterus enlarged due to pregnancy  Musculoskeletal: She exhibits tenderness.  Neurological: She is alert and oriented to person, place, and time.  Skin: Skin is warm and dry.  Psychiatric: She has a normal mood and affect. Her behavior is normal. Judgment and thought content normal.   EFM: baseline 125, accels present, no decels, Category 1 tracing Contractions: Occassional Dilation: 1 Effacement (%): 50 Station: -3 Presentation: Vertex Exam by:: Grace [name], CNM STUDENT   MAU Course  Procedures  Assessment and Plan  27 you [id number] with IUP at 34.[redacted] weeks gestation  Rx Procardia x 2  in MAU Tylenol for  Headache No cervical change in 1.5 hours Continue to monitor contractions   Grace [name] 02/24/2013, 9:33 PM   Seen and examined by me also Agree with note Aviva Signs, CNM

## 2013-03-08 ENCOUNTER — Inpatient Hospital Stay (HOSPITAL_COMMUNITY)
Admission: AD | Admit: 2013-03-08 | Discharge: 2013-03-08 | Disposition: A | Discharge status: Home / Self Care | Payer: Medicaid Other | Source: Ambulatory Visit | Attending: Obstetrics & Gynecology | Admitting: Obstetrics & Gynecology

## 2013-03-08 ENCOUNTER — Encounter (HOSPITAL_COMMUNITY): Payer: Self-pay | Admitting: Obstetrics and Gynecology

## 2013-03-08 ENCOUNTER — Ambulatory Visit (INDEPENDENT_AMBULATORY_CARE_PROVIDER_SITE_OTHER): Payer: Medicaid Other | Admitting: Obstetrics & Gynecology

## 2013-03-08 VITALS — BP 147/95 | Wt 182.3 lb

## 2013-03-08 DIAGNOSIS — O211 Hyperemesis gravidarum with metabolic disturbance: Secondary | ICD-10-CM

## 2013-03-08 DIAGNOSIS — E878 Other disorders of electrolyte and fluid balance, not elsewhere classified: Secondary | ICD-10-CM | POA: Insufficient documentation

## 2013-03-08 DIAGNOSIS — Z23 Encounter for immunization: Secondary | ICD-10-CM

## 2013-03-08 DIAGNOSIS — O212 Late vomiting of pregnancy: Secondary | ICD-10-CM

## 2013-03-08 DIAGNOSIS — O0993 Supervision of high risk pregnancy, unspecified, third trimester: Secondary | ICD-10-CM

## 2013-03-08 DIAGNOSIS — A599 Trichomoniasis, unspecified: Secondary | ICD-10-CM

## 2013-03-08 DIAGNOSIS — O133 Gestational [pregnancy-induced] hypertension without significant proteinuria, third trimester: Secondary | ICD-10-CM

## 2013-03-08 DIAGNOSIS — O98819 Other maternal infectious and parasitic diseases complicating pregnancy, unspecified trimester: Secondary | ICD-10-CM | POA: Insufficient documentation

## 2013-03-08 DIAGNOSIS — A5901 Trichomonal vulvovaginitis: Secondary | ICD-10-CM | POA: Insufficient documentation

## 2013-03-08 DIAGNOSIS — O0990 Supervision of high risk pregnancy, unspecified, unspecified trimester: Secondary | ICD-10-CM | POA: Insufficient documentation

## 2013-03-08 LAB — URINALYSIS, ROUTINE W REFLEX MICROSCOPIC
Bilirubin Urine: NEGATIVE
Glucose, UA: NEGATIVE mg/dL
Nitrite: NEGATIVE
Specific Gravity, Urine: 1.01 (ref 1.005–1.030)
Urobilinogen, UA: 0.2 mg/dL (ref 0.0–1.0)
pH: 6.5 (ref 5.0–8.0)

## 2013-03-08 LAB — PROTEIN / CREATININE RATIO, URINE
Protein Creatinine Ratio: 0.13 (ref 0.00–0.15)
Total Protein, Urine: 9.1 mg/dL

## 2013-03-08 LAB — URINE MICROSCOPIC-ADD ON

## 2013-03-08 LAB — POCT URINALYSIS DIP (DEVICE)
Glucose, UA: NEGATIVE mg/dL
Hgb urine dipstick: NEGATIVE
Nitrite: NEGATIVE
Protein, ur: NEGATIVE mg/dL
Specific Gravity, Urine: 1.015 (ref 1.005–1.030)
Urobilinogen, UA: 0.2 mg/dL (ref 0.0–1.0)

## 2013-03-08 MED ORDER — TETANUS-DIPHTH-ACELL PERTUSSIS 5-2.5-18.5 LF-MCG/0.5 IM SUSP
0.5000 mL | Freq: Once | INTRAMUSCULAR | Status: AC
  Administered 2013-03-08: 0.5 mL via INTRAMUSCULAR

## 2013-03-08 NOTE — Patient Instructions (Signed)

## 2013-03-08 NOTE — MAU Note (Signed)
Pt sent up from clinic for elevated b/p. C/o headache and swelling.

## 2013-03-08 NOTE — MAU Provider Note (Signed)
Chief Complaint:  Hypertension   HPI: Grace [name] is a 27 y.o. [id number] at 50w1dwho sent to maternity admissions from clinic for evaluation of high blood pressures and swelling. Also reporting a moderate bilateral HA with blurry vision. No scotomata. She has migraines normally 1-2/wk but more headaches since becoming pregnant. Denies RUQ pain.   Denies contractions, lof, VB. + FM.   She receives 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 Pregnancy has been complicated by hyperemesis gravidarum and trichomonas which was treated.  Pregnancy Course:   Past Medical History: Past Medical History  Diagnosis Date  . Sickle cell trait   . Headache(784.0)   . Hyperthyroidism   . Sickle cell trait   . Chronic kidney disease     recurrent UTIs  . GERD (gastroesophageal reflux disease)   . Cholecystitis 08/18/2011    Past obstetric history: OB History  Gravida Para Term Preterm AB SAB TAB Ectopic Multiple Living  4 2 2  1 1    2     # Outcome Date GA Lbr Len/2nd Weight Sex Delivery Anes PTL Lv  4 CUR           3 TRM 05/11/11 [redacted]w[redacted]d 896:52 / 01:37 2.36 kg (5 lb 3.3 oz) M SVD EPI  Y     Comments: WNL  2 TRM 07/18/05 [redacted]w[redacted]d 03:00 3.033 kg (6 lb 11 oz) M SVD EPI N Y     Comments: baby has sickle cell disease. hyperemesis  1 SAB 12/2002 [redacted]w[redacted]d            Comments: Molar Pregnancy      Past Surgical History: Past Surgical History  Procedure Laterality Date  . Dilation and curettage of uterus    . Cholecystectomy  08/19/2011    Procedure: LAPAROSCOPIC CHOLECYSTECTOMY WITH INTRAOPERATIVE CHOLANGIOGRAM;  Surgeon: Lodema Pilot, DO;  Location: [facility] OR;  Service: General;  Laterality: N/A;    Family History: Family History  Problem Relation Age of Onset  . Diabetes Mother   . Cancer Father   . Heart disease Father   . Diabetes Maternal Uncle   . Cancer Maternal Grandmother   . Cancer Maternal Grandfather   . Hypertension Maternal Grandfather   . Diabetes Maternal Grandfather   .  Sickle cell anemia Son   . Asthma Son   . Heart disease Paternal Grandfather     Social History: History  Substance Use Topics  . Smoking status: Never Smoker   . Smokeless tobacco: Never Used  . Alcohol Use: No    Allergies: No Known Allergies  Meds:  Prescriptions prior to admission  Medication Sig Dispense Refill  . ferrous sulfate (FERROUSUL) 325 (65 FE) MG tablet Take 1 tablet (325 mg total) by mouth daily with breakfast.  60 tablet  1  . labetalol (NORMODYNE) 100 MG tablet Take 0.5 tablets (50 mg total) by mouth 2 (two) times daily.  60 tablet  0  . methimazole (TAPAZOLE) 10 MG tablet Take 2 tablets (20 mg total) by mouth 2 (two) times daily.  120 tablet  3  . pantoprazole (PROTONIX) 40 MG tablet Take 1 tablet (40 mg total) by mouth daily.  30 tablet  3  . Prenatal Vit-Fe Fumarate-FA (PRENATAL MULTIVITAMIN) TABS Take 1 tablet by mouth daily at 12 noon.      . pyridOXINE (VITAMIN B-6) 25 MG tablet Take 1 tablet (25 mg total) by mouth 3 (three) times daily.  90 tablet  3  . ranitidine (ZANTAC) 150  MG tablet Take 150 mg by mouth as needed for heartburn.      . traMADol (ULTRAM) 50 MG tablet Take 1 tablet (50 mg total) by mouth every 6 (six) hours as needed.  10 tablet  0    ROS: Pertinent findings in history of present illness.  Physical Exam  Blood pressure 122/87, pulse 95, temperature 98.4 F (36.9 C), temperature source Oral, resp. rate 18, last menstrual period 06/28/2012. GENERAL: Well-developed, well-nourished female in no acute distress.  HEENT: normocephalic HEART: normal rate RESP: normal effort ABDOMEN: Soft, non-tender, gravid appropriate for gestational age EXTREMITIES: Nontender, no edema NEURO: alert and oriented, DTRs wnl SPECULUM EXAM: NEFG, physiologic discharge, no blood, cervix clean at 1/20/-3    FHT:  Baseline 140s, moderate variability, 15x15 accelerations present, no decelerations Contractions: None   Labs: Results for orders placed during the  hospital encounter of 03/08/13 (from the past 24 hour(s))  URINALYSIS, ROUTINE W REFLEX MICROSCOPIC     Status: Abnormal   Collection Time    03/08/13 10:35 AM      Result Value Range   Color, Urine YELLOW  YELLOW   APPearance CLEAR  CLEAR   Specific Gravity, Urine 1.010  1.005 - 1.030   pH 6.5  5.0 - 8.0   Glucose, UA NEGATIVE  NEGATIVE mg/dL   Hgb urine dipstick NEGATIVE  NEGATIVE   Bilirubin Urine NEGATIVE  NEGATIVE   Ketones, ur NEGATIVE  NEGATIVE mg/dL   Protein, ur NEGATIVE  NEGATIVE mg/dL   Urobilinogen, UA 0.2  0.0 - 1.0 mg/dL   Nitrite NEGATIVE  NEGATIVE   Leukocytes, UA SMALL (*) NEGATIVE  URINE MICROSCOPIC-ADD ON     Status: None   Collection Time    03/08/13 10:35 AM      Result Value Range   Squamous Epithelial / LPF RARE  RARE   WBC, UA 3-6  <3 WBC/hpf   RBC / HPF 0-2  <3 RBC/hpf  PROTEIN / CREATININE RATIO, URINE     Status: None   Collection Time    03/08/13 10:38 AM      Result Value Range   Creatinine, Urine 67.98     Total Protein, Urine 9.1     PROTEIN CREATININE RATIO 0.13  0.00 - 0.15    Imaging:  No results found. ED Course   Assessment: 27 y.o. [id number] at [redacted]w[redacted]d.   1. Supervision of high-risk pregnancy, third trimester   2. Hyperemesis gravidarum before end of [redacted] week gestation with electrolyte imbalance   3. Trichomonas     Plan: Evaluation of elevated BP/swelling: - PIH labs are reassuring, UPr:Cr 0.13,  and BP has normalized. General trend of BP is good, normotensive.  - Discharge home - Labor precautions and fetal kick counts reviewed - Needs to f/u in [facility], needs 3hr GTT for 1hr: 150.   FWB:  - FHR 140s, reactive with 15x15 accel's, no decel's        Follow-up Information   Follow up with [facility].   Contact information:   [address] [place] [address]        Medication List         ferrous sulfate 325 (65 FE) MG tablet  Commonly known as:  FERROUSUL  Take 1 tablet (325 mg total) by mouth daily  with breakfast.     labetalol 100 MG tablet  Commonly known as:  NORMODYNE  Take 0.5 tablets (50 mg total) by mouth 2 (two) times daily.  methimazole 10 MG tablet  Commonly known as:  TAPAZOLE  Take 2 tablets (20 mg total) by mouth 2 (two) times daily.     pantoprazole 40 MG tablet  Commonly known as:  PROTONIX  Take 1 tablet (40 mg total) by mouth daily.     prenatal multivitamin Tabs tablet  Take 1 tablet by mouth daily at 12 noon.     pyridOXINE 25 MG tablet  Commonly known as:  VITAMIN B-6  Take 1 tablet (25 mg total) by mouth 3 (three) times daily.     ranitidine 150 MG tablet  Commonly known as:  ZANTAC  Take 150 mg by mouth as needed for heartburn.     traMADol 50 MG tablet  Commonly known as:  ULTRAM  Take 1 tablet (50 mg total) by mouth every 6 (six) hours as needed.        [name], MD, PGY-1 03/08/2013 1:53 PM  Evaluation and management procedures were performed by Resident physician under my supervision/collaboration. Chart reviewed, patient examined by me and I agree with management and plan.

## 2013-03-08 NOTE — Progress Notes (Signed)
GBS and GC done. BP elevated and swelling, to MAU for evaluation. Dr. Penne Lash aware. HAS been absent for 7 weeks, abnl GTT needs f/u

## 2013-03-08 NOTE — MAU Note (Signed)
Patient was sent from office for BP evaluation.

## 2013-03-08 NOTE — Progress Notes (Signed)
Pulse-103  Edema-feet, ankle, legs  Pressure-pelvic

## 2013-03-09 LAB — GC/CHLAMYDIA PROBE AMP: CT Probe RNA: NEGATIVE

## 2013-03-11 LAB — CULTURE, BETA STREP (GROUP B ONLY)

## 2013-03-11 NOTE — MAU Provider Note (Signed)
Pt needs 24 hour urine and continue 2x week testing.

## 2013-03-12 ENCOUNTER — Telehealth: Payer: Self-pay | Admitting: *Deleted

## 2013-03-12 ENCOUNTER — Encounter: Payer: Self-pay | Admitting: *Deleted

## 2013-03-12 DIAGNOSIS — O133 Gestational [pregnancy-induced] hypertension without significant proteinuria, third trimester: Secondary | ICD-10-CM

## 2013-03-12 NOTE — Telephone Encounter (Signed)
Also discussed with patient need for 24 hour urine. She states she has not been told to do that and does not have supplies. Informed her we will do that tomorrow at her appt.

## 2013-03-12 NOTE — Telephone Encounter (Signed)
Called Grace [name] and notified her of nst x 2 weekly starting tomorrow , and BPP Friday. Grace [name] voices understanding.

## 2013-03-12 NOTE — Telephone Encounter (Signed)
Message copied by Gerome Apley on Mon Mar 12, 2013 10:37 AM ------      Message from: Lesly Dukes      Created: Sun Mar 11, 2013 12:49 PM       Want to make sure pt is in 2x week testing and is getting a 24 hour urine.  Thx. ------

## 2013-03-13 ENCOUNTER — Encounter: Payer: Self-pay | Admitting: Obstetrics & Gynecology

## 2013-03-13 ENCOUNTER — Ambulatory Visit (INDEPENDENT_AMBULATORY_CARE_PROVIDER_SITE_OTHER): Payer: Medicaid Other | Admitting: Obstetrics & Gynecology

## 2013-03-13 VITALS — BP 130/73 | Wt 182.1 lb

## 2013-03-13 DIAGNOSIS — D573 Sickle-cell trait: Secondary | ICD-10-CM

## 2013-03-13 DIAGNOSIS — E079 Disorder of thyroid, unspecified: Secondary | ICD-10-CM

## 2013-03-13 DIAGNOSIS — O09A0 Supervision of pregnancy with history of molar pregnancy, unspecified trimester: Secondary | ICD-10-CM

## 2013-03-13 DIAGNOSIS — O0993 Supervision of high risk pregnancy, unspecified, third trimester: Secondary | ICD-10-CM

## 2013-03-13 LAB — POCT URINALYSIS DIP (DEVICE)
Bilirubin Urine: NEGATIVE
Glucose, UA: NEGATIVE mg/dL
Nitrite: NEGATIVE
Protein, ur: NEGATIVE mg/dL
Urobilinogen, UA: 0.2 mg/dL (ref 0.0–1.0)
pH: 6 (ref 5.0–8.0)

## 2013-03-13 NOTE — Patient Instructions (Signed)
Gestational Diabetes Mellitus Gestational diabetes mellitus, often simply referred to as gestational diabetes, is a type of diabetes that some women develop during pregnancy. In gestational diabetes, the pancreas does not make enough insulin (a hormone), the cells are less responsive to the insulin that is made (insulin resistance), or both.Normally, insulin moves sugars from food into the tissue cells. The tissue cells use the sugars for energy. The lack of insulin or the lack of normal response to insulin causes excess sugars to build up in the blood instead of going into the tissue cells. As a result, high blood sugar (hyperglycemia) develops. The effect of high sugar (glucose) levels can cause many complications.  RISK FACTORS You have an increased chance of developing gestational diabetes if you have a family history of diabetes and also have one or more of the following risk factors:  A body mass index over 30 (obesity).  A previous pregnancy with gestational diabetes.  An older age at the time of pregnancy. If blood glucose levels are kept in the normal range during pregnancy, women can have a healthy pregnancy. If your blood glucose levels are not well controlled, there may be risks to you, your unborn baby (fetus), your labor and delivery, or your newborn baby.  SYMPTOMS  If symptoms are experienced, they are much like symptoms you would normally expect during pregnancy. The symptoms of gestational diabetes include:   Increased thirst (polydipsia).  Increased urination (polyuria).  Increased urination during the night (nocturia).  Weight loss. This weight loss may be rapid.  Frequent, recurring infections.  Tiredness (fatigue).  Weakness.  Vision changes, such as blurred vision.  Fruity smell to your breath.  Abdominal pain. DIAGNOSIS Diabetes is diagnosed when blood glucose levels are increased. Your blood glucose level may be checked by one or more of the following  blood tests:  A fasting blood glucose test. You will not be allowed to eat for at least 8 hours before a blood sample is taken.  A random blood glucose test. Your blood glucose is checked at any time of the day regardless of when you ate.  A hemoglobin A1c blood glucose test. A hemoglobin A1c test provides information about blood glucose control over the previous 3 months.  An oral glucose tolerance test (OGTT). Your blood glucose is measured after you have not eaten (fasted) for 1 3 hours and then after you drink a glucose-containing beverage. Since the hormones that cause insulin resistance are highest at about 24 28 weeks of a pregnancy, an OGTT is usually performed during that time. If you have risk factors for gestational diabetes, your caregiver may test you for gestational diabetes earlier than 24 weeks of pregnancy. TREATMENT   You will need to take diabetes medicine or insulin daily to keep blood glucose levels in the desired range.  You will need to match insulin dosing with exercise and healthy food choices. The treatment goal is to maintain the before meal (preprandial), bedtime, and overnight blood glucose level at 60 99 mg/dL during pregnancy. The treatment goal is to further maintain peak after meal blood sugar (postprandial glucose) level at 100 140 mg/dL. HOME CARE INSTRUCTIONS   Have your hemoglobin A1c level checked twice a year.  Perform daily blood glucose monitoring as directed by your caregiver. It is common to perform frequent blood glucose monitoring.  Monitor urine ketones when you are ill and as directed by your caregiver.  Take your diabetes medicine and insulin as directed by your caregiver to maintain   your blood glucose level in the desired range.  Never run out of diabetes medicine or insulin. It is needed every day.  Adjust insulin based on your intake of carbohydrates. Carbohydrates can raise blood glucose levels but need to be included in your diet.  Carbohydrates provide vitamins, minerals, and fiber which are an essential part of a healthy diet. Carbohydrates are found in fruits, vegetables, whole grains, dairy products, legumes, and foods containing added sugars.    Eat healthy foods. Alternate 3 meals with 3 snacks.  Maintain a healthy weight gain. The usual total expected weight gain varies according to your prepregnancy body mass index (BMI).  Carry a medical alert card or wear your medical alert jewelry.  Carry a 15 gram carbohydrate snack with you at all times to treat low blood glucose (hypoglycemia). Some examples of 15 gram carbohydrate snacks include:  Glucose tablets, 3 or 4   Glucose gel, 15 gram tube  Raisins, 2 tablespoons (24 g)  Jelly beans, 6  Animal crackers, 8  Fruit juice, regular soda, or low fat milk, 4 ounces (120 mL)  Gummy treats, 9    Recognize hypoglycemia. Hypoglycemia during pregnancy occurs with blood glucose levels of 60 mg/dL and below. The risk for hypoglycemia increases when fasting or skipping meals, during or after intense exercise, and during sleep. Hypoglycemia symptoms can include:  Tremors or shakes.  Decreased ability to concentrate.  Sweating.  Increased heart rate.  Headache.  Dry mouth.  Hunger.  Irritability.  Anxiety.  Restless sleep.  Altered speech or coordination.  Confusion.  Treat hypoglycemia promptly. If you are alert and able to safely swallow, follow the 15:15 rule:  Take 15 20 grams of rapid-acting glucose or carbohydrate. Rapid-acting options include glucose gel, glucose tablets, or 4 ounces (120 mL) of fruit juice, regular soda, or low fat milk.  Check your blood glucose level 15 minutes after taking the glucose.   Take 15 20 grams more of glucose if the repeat blood glucose level is still 70 mg/dL or below.  Eat a meal or snack within 1 hour once blood glucose levels return to normal.  Be alert to polyuria and polydipsia which are early  signs of hyperglycemia. An early awareness of hyperglycemia allows for prompt treatment. Treat hyperglycemia as directed by your caregiver.  Engage in at least 30 minutes of physical activity a day or as directed by your caregiver. Ten minutes of physical activity timed 30 minutes after each meal is encouraged to control postprandial blood glucose levels.  Adjust your insulin dosing and food intake as needed if you start a new exercise or sport.  Follow your sick day plan at any time you are unable to eat or drink as usual.  Avoid tobacco and alcohol use.  Follow up with your caregiver regularly.  Follow the advice of your caregiver regarding your prenatal and post-delivery (postpartum) appointments, meal planning, exercise, medicines, vitamins, blood tests, other medical tests, and physical activities.  Perform daily skin and foot care. Examine your skin and feet daily for cuts, bruises, redness, nail problems, bleeding, blisters, or sores.  Brush your teeth and gums at least twice a day and floss at least once a day. Follow up with your dentist regularly.  Schedule an eye exam during the first trimester of your pregnancy or as directed by your caregiver.  Share your diabetes management plan with your workplace or school.  Stay up-to-date with immunizations.  Learn to manage stress.  Obtain ongoing diabetes education and   support as needed. SEEK MEDICAL CARE IF:   You are unable to eat food or drink fluids for more than 6 hours.  You have nausea and vomiting for more than 6 hours.  You have a blood glucose level of 200 mg/dL and you have ketones in your urine.  There is a change in mental status.  You develop vision problems.  You have a persistent headache.  You have upper abdominal pain or discomfort.  You develop an additional serious illness.  You have diarrhea for more than 6 hours.  You have been sick or have had a fever for a couple of days and are not getting  better. SEEK IMMEDIATE MEDICAL CARE IF:   You have difficulty breathing.  You no longer feel the baby moving.  You are bleeding or have discharge from your vagina.  You start having premature contractions or labor. MAKE SURE YOU:  Understand these instructions.  Will watch your condition.  Will get help right away if you are not doing well or get worse. Document Released: 07/12/2000 Document Revised: 07/31/2012 Document Reviewed: 11/02/2011 [employer] Patient Information 2014 [employer].  

## 2013-03-13 NOTE — Progress Notes (Signed)
NST reviewed and reactive. Pt not fasting needs 3 hour GTT Pt to start 24 hour urine today- encouraged to bring in 24hour urine and complete 3hr GTT in am pt wants sterilization vs depo provera - title XIX NOT signed as pt does not have active ins    Grace [name], M.D., Evern Core

## 2013-03-13 NOTE — Progress Notes (Signed)
Pulse: 108 Still needs 3hr gtt

## 2013-03-14 ENCOUNTER — Telehealth: Payer: Self-pay

## 2013-03-14 ENCOUNTER — Other Ambulatory Visit: Payer: Medicaid Other

## 2013-03-14 DIAGNOSIS — O0993 Supervision of high risk pregnancy, unspecified, third trimester: Secondary | ICD-10-CM

## 2013-03-14 DIAGNOSIS — O133 Gestational [pregnancy-induced] hypertension without significant proteinuria, third trimester: Secondary | ICD-10-CM

## 2013-03-14 LAB — COMPREHENSIVE METABOLIC PANEL
AST: 33 U/L (ref 0–37)
Albumin: 3.1 g/dL — ABNORMAL LOW (ref 3.5–5.2)
Alkaline Phosphatase: 127 U/L — ABNORMAL HIGH (ref 39–117)
BUN: 5 mg/dL — ABNORMAL LOW (ref 6–23)
CO2: 21 mEq/L (ref 19–32)
Calcium: 8.3 mg/dL — ABNORMAL LOW (ref 8.4–10.5)
Chloride: 107 mEq/L (ref 96–112)
Glucose, Bld: 68 mg/dL — ABNORMAL LOW (ref 70–99)
Potassium: 4.4 mEq/L (ref 3.5–5.3)
Sodium: 136 mEq/L (ref 135–145)
Total Protein: 6 g/dL (ref 6.0–8.3)

## 2013-03-14 LAB — GLUCOSE TOLERANCE, 3 HOURS
Glucose Tolerance, 1 hour: 155 mg/dL (ref 70–189)
Glucose Tolerance, 2 hour: 138 mg/dL (ref 70–164)
Glucose Tolerance, Fasting: 69 mg/dL — ABNORMAL LOW (ref 70–104)

## 2013-03-14 NOTE — Telephone Encounter (Signed)
Message copied by Faythe Casa on Wed Mar 14, 2013  2:48 PM ------      Message from: Willodean Rosenthal      Created: Wed Mar 14, 2013  2:10 PM       Please call pt.  She needs to come in Friday Nov 28th for a BPP.            Thx,      clh-S  ------

## 2013-03-14 NOTE — Telephone Encounter (Signed)
Called [facility]ae and left a message you have an appointmetn for an Ultrasound Friday at 1045.

## 2013-03-14 NOTE — Telephone Encounter (Signed)
Message copied by Gerome Apley on Wed Mar 14, 2013  2:49 PM ------      Message from: Willodean Rosenthal      Created: Wed Mar 14, 2013  2:10 PM       Please call pt.  She needs to come in Friday Nov 28th for a BPP.            Thx,      clh-S  ------

## 2013-03-16 ENCOUNTER — Ambulatory Visit (HOSPITAL_COMMUNITY)
Admission: RE | Admit: 2013-03-16 | Discharge: 2013-03-16 | Disposition: A | Discharge status: Home / Self Care | Payer: Medicaid Other | Source: Ambulatory Visit | Attending: Obstetrics & Gynecology | Admitting: Obstetrics & Gynecology

## 2013-03-16 ENCOUNTER — Ambulatory Visit (HOSPITAL_COMMUNITY): Admission: RE | Admit: 2013-03-16 | Payer: Medicaid Other | Source: Ambulatory Visit

## 2013-03-16 DIAGNOSIS — O139 Gestational [pregnancy-induced] hypertension without significant proteinuria, unspecified trimester: Secondary | ICD-10-CM | POA: Insufficient documentation

## 2013-03-16 DIAGNOSIS — O133 Gestational [pregnancy-induced] hypertension without significant proteinuria, third trimester: Secondary | ICD-10-CM

## 2013-03-16 DIAGNOSIS — Z3689 Encounter for other specified antenatal screening: Secondary | ICD-10-CM | POA: Insufficient documentation

## 2013-03-16 LAB — PROTEIN, URINE, 24 HOUR
Protein, 24H Urine: 65 mg/d (ref 50–100)
Protein, Urine: 3 mg/dL

## 2013-03-16 LAB — CREATININE CLEARANCE, URINE, 24 HOUR
Creatinine Clearance: 155 mL/min — ABNORMAL HIGH (ref 75–115)
Creatinine, 24H Ur: 1206 mg/d (ref 700–1800)
Creatinine, Urine: 56.1 mg/dL
Creatinine: 0.54 mg/dL (ref 0.50–1.10)

## 2013-03-19 NOTE — Telephone Encounter (Signed)
Patient came to appt on 11/28, BPP done

## 2013-03-20 ENCOUNTER — Encounter: Payer: Self-pay | Admitting: Obstetrics and Gynecology

## 2013-03-20 ENCOUNTER — Other Ambulatory Visit: Payer: Medicaid Other

## 2013-03-20 ENCOUNTER — Ambulatory Visit (INDEPENDENT_AMBULATORY_CARE_PROVIDER_SITE_OTHER): Payer: Medicaid Other | Admitting: Obstetrics and Gynecology

## 2013-03-20 VITALS — BP 122/76 | Wt 178.9 lb

## 2013-03-20 DIAGNOSIS — O133 Gestational [pregnancy-induced] hypertension without significant proteinuria, third trimester: Secondary | ICD-10-CM

## 2013-03-20 DIAGNOSIS — O139 Gestational [pregnancy-induced] hypertension without significant proteinuria, unspecified trimester: Secondary | ICD-10-CM | POA: Insufficient documentation

## 2013-03-20 DIAGNOSIS — O0993 Supervision of high risk pregnancy, unspecified, third trimester: Secondary | ICD-10-CM

## 2013-03-20 DIAGNOSIS — E079 Disorder of thyroid, unspecified: Secondary | ICD-10-CM

## 2013-03-20 LAB — POCT URINALYSIS DIP (DEVICE)
Bilirubin Urine: NEGATIVE
Nitrite: NEGATIVE
Protein, ur: NEGATIVE mg/dL
Specific Gravity, Urine: 1.01 (ref 1.005–1.030)
pH: 6 (ref 5.0–8.0)

## 2013-03-20 NOTE — Progress Notes (Signed)
P = 111        Pt reports different dosages for some of her meds. She has been taking Labetalol 100 mg tablet po twice daily and methimazole 10 mg tablet once daily.  Pt also reports having 2 episodes of clear fluid leaking from vagina over the past 4 days. She did not come to the hospital because of lack of child care. Pt has moderate amt leukocytes on random void urine- she denies pain upon urination.

## 2013-03-20 NOTE — Progress Notes (Signed)
NST reviewed and reactive. Medications reviewed with patient. BP well controlled today. FM/labor precautions reviewed. SSE- no pooling, neg nitrazine, copious thin white discharge.

## 2013-03-21 ENCOUNTER — Encounter: Payer: Self-pay | Admitting: *Deleted

## 2013-03-23 ENCOUNTER — Ambulatory Visit (INDEPENDENT_AMBULATORY_CARE_PROVIDER_SITE_OTHER): Payer: Medicaid Other | Admitting: *Deleted

## 2013-03-23 VITALS — BP 142/95

## 2013-03-23 DIAGNOSIS — O133 Gestational [pregnancy-induced] hypertension without significant proteinuria, third trimester: Secondary | ICD-10-CM

## 2013-03-23 DIAGNOSIS — O139 Gestational [pregnancy-induced] hypertension without significant proteinuria, unspecified trimester: Secondary | ICD-10-CM

## 2013-03-23 NOTE — Progress Notes (Signed)
NST reviewed and reactive.  

## 2013-03-23 NOTE — Progress Notes (Signed)
P = 107  Pt reports increased amount of headaches over the past couple weeks- has H/A approximately 3 times per week. Tylenol is not always effective pain relief, however it goes away if she sleeps. BP readings discussed with Dr. Ike Bene- continue current dose of Labetalol.

## 2013-03-27 ENCOUNTER — Telehealth (HOSPITAL_COMMUNITY): Payer: Self-pay | Admitting: *Deleted

## 2013-03-27 ENCOUNTER — Encounter: Payer: Self-pay | Admitting: Obstetrics and Gynecology

## 2013-03-27 ENCOUNTER — Ambulatory Visit (INDEPENDENT_AMBULATORY_CARE_PROVIDER_SITE_OTHER): Payer: Medicaid Other | Admitting: Obstetrics and Gynecology

## 2013-03-27 ENCOUNTER — Encounter (HOSPITAL_COMMUNITY): Payer: Self-pay | Admitting: *Deleted

## 2013-03-27 VITALS — BP 132/78 | Temp 97.4°F | Wt 185.9 lb

## 2013-03-27 DIAGNOSIS — O133 Gestational [pregnancy-induced] hypertension without significant proteinuria, third trimester: Secondary | ICD-10-CM

## 2013-03-27 DIAGNOSIS — E079 Disorder of thyroid, unspecified: Secondary | ICD-10-CM

## 2013-03-27 DIAGNOSIS — O139 Gestational [pregnancy-induced] hypertension without significant proteinuria, unspecified trimester: Secondary | ICD-10-CM

## 2013-03-27 DIAGNOSIS — O0993 Supervision of high risk pregnancy, unspecified, third trimester: Secondary | ICD-10-CM

## 2013-03-27 DIAGNOSIS — O0913 Supervision of pregnancy with history of ectopic or molar pregnancy, third trimester: Secondary | ICD-10-CM

## 2013-03-27 DIAGNOSIS — O09A0 Supervision of pregnancy with history of molar pregnancy, unspecified trimester: Secondary | ICD-10-CM

## 2013-03-27 LAB — POCT URINALYSIS DIP (DEVICE)
Hgb urine dipstick: NEGATIVE
Ketones, ur: NEGATIVE mg/dL
Protein, ur: NEGATIVE mg/dL
Urobilinogen, UA: 0.2 mg/dL (ref 0.0–1.0)
pH: 6 (ref 5.0–8.0)

## 2013-03-27 NOTE — Progress Notes (Signed)
P= 106  Pt c/o of feeling like blood pressure is elevated, feeling flushed.  Headache since yesterday, tx with tylenol.  Denies any spots before eyes or blurred vision. Took Labetalol dose at 0600 this am. Manual Blood pressure taken. IOL scheduled for 12/10 @ 0700.  Pt aware.

## 2013-03-27 NOTE — Progress Notes (Signed)
Patient doing well this morning without complaints. FM/labor/preeclampsia precautions reviewed. Patient will be scheduled for IOL at 39 weeks secondary to [facility] on labetalol NST reviewed and reactive

## 2013-03-27 NOTE — Telephone Encounter (Signed)
Preadmission screenPreadmission screen 

## 2013-03-28 ENCOUNTER — Encounter (HOSPITAL_COMMUNITY): Payer: Medicaid Other | Admitting: Anesthesiology

## 2013-03-28 ENCOUNTER — Inpatient Hospital Stay (HOSPITAL_COMMUNITY)
Admission: RE | Admit: 2013-03-28 | Discharge: 2013-03-30 | DRG: 774 | Disposition: A | Discharge status: Home / Self Care | Payer: Medicaid Other | Source: Ambulatory Visit | Attending: Obstetrics and Gynecology | Admitting: Obstetrics and Gynecology

## 2013-03-28 ENCOUNTER — Encounter (HOSPITAL_COMMUNITY): Payer: Self-pay

## 2013-03-28 ENCOUNTER — Observation Stay (HOSPITAL_COMMUNITY): Payer: Medicaid Other | Admitting: Anesthesiology

## 2013-03-28 VITALS — BP 118/77 | HR 79 | Temp 98.2°F | Resp 17 | Ht 63.5 in | Wt 185.0 lb

## 2013-03-28 DIAGNOSIS — E0590 Thyrotoxicosis, unspecified without thyrotoxic crisis or storm: Secondary | ICD-10-CM | POA: Diagnosis present

## 2013-03-28 DIAGNOSIS — O0993 Supervision of high risk pregnancy, unspecified, third trimester: Secondary | ICD-10-CM

## 2013-03-28 DIAGNOSIS — A599 Trichomoniasis, unspecified: Secondary | ICD-10-CM

## 2013-03-28 DIAGNOSIS — E079 Disorder of thyroid, unspecified: Secondary | ICD-10-CM | POA: Diagnosis present

## 2013-03-28 DIAGNOSIS — O9902 Anemia complicating childbirth: Secondary | ICD-10-CM | POA: Diagnosis present

## 2013-03-28 DIAGNOSIS — O211 Hyperemesis gravidarum with metabolic disturbance: Secondary | ICD-10-CM

## 2013-03-28 DIAGNOSIS — O1002 Pre-existing essential hypertension complicating childbirth: Principal | ICD-10-CM | POA: Diagnosis present

## 2013-03-28 DIAGNOSIS — O99284 Endocrine, nutritional and metabolic diseases complicating childbirth: Secondary | ICD-10-CM

## 2013-03-28 DIAGNOSIS — D573 Sickle-cell trait: Secondary | ICD-10-CM | POA: Diagnosis present

## 2013-03-28 DIAGNOSIS — O10919 Unspecified pre-existing hypertension complicating pregnancy, unspecified trimester: Secondary | ICD-10-CM | POA: Diagnosis present

## 2013-03-28 LAB — CBC
HCT: 27.8 % — ABNORMAL LOW (ref 36.0–46.0)
Hemoglobin: 9.5 g/dL — ABNORMAL LOW (ref 12.0–15.0)
MCH: 25.8 pg — ABNORMAL LOW (ref 26.0–34.0)
MCHC: 33.1 g/dL (ref 30.0–36.0)
MCV: 78.1 fL (ref 78.0–100.0)
MCV: 78.6 fL (ref 78.0–100.0)
Platelets: 320 10*3/uL (ref 150–400)
Platelets: 319 10*3/uL (ref 150–400)
RBC: 3.73 MIL/uL — ABNORMAL LOW (ref 3.87–5.11)
RDW: 15.9 % — ABNORMAL HIGH (ref 11.5–15.5)
RDW: 16 % — ABNORMAL HIGH (ref 11.5–15.5)
WBC: 7.4 10*3/uL (ref 4.0–10.5)

## 2013-03-28 LAB — COMPREHENSIVE METABOLIC PANEL
Albumin: 2.4 g/dL — ABNORMAL LOW (ref 3.5–5.2)
Alkaline Phosphatase: 148 U/L — ABNORMAL HIGH (ref 39–117)
BUN: 5 mg/dL — ABNORMAL LOW (ref 6–23)
Creatinine, Ser: 0.62 mg/dL (ref 0.50–1.10)
Potassium: 3.8 mEq/L (ref 3.5–5.1)
Total Bilirubin: 0.5 mg/dL (ref 0.3–1.2)
Total Protein: 6 g/dL (ref 6.0–8.3)

## 2013-03-28 LAB — PROTEIN / CREATININE RATIO, URINE
Creatinine, Urine: 51.12 mg/dL
Total Protein, Urine: 6.3 mg/dL

## 2013-03-28 MED ORDER — EPHEDRINE 5 MG/ML INJ
10.0000 mg | INTRAVENOUS | Status: DC | PRN
  Filled 2013-03-28: qty 2
  Filled 2013-03-28: qty 4

## 2013-03-28 MED ORDER — EPHEDRINE 5 MG/ML INJ
10.0000 mg | INTRAVENOUS | Status: DC | PRN
  Filled 2013-03-28: qty 2

## 2013-03-28 MED ORDER — OXYTOCIN 40 UNITS IN LACTATED RINGERS INFUSION - SIMPLE MED
1.0000 m[IU]/min | INTRAVENOUS | Status: DC
  Administered 2013-03-28: 1 m[IU]/min via INTRAVENOUS

## 2013-03-28 MED ORDER — LACTATED RINGERS IV SOLN
INTRAVENOUS | Status: DC
  Administered 2013-03-28 (×4): via INTRAVENOUS

## 2013-03-28 MED ORDER — FENTANYL CITRATE 0.05 MG/ML IJ SOLN
100.0000 ug | INTRAMUSCULAR | Status: DC | PRN
  Administered 2013-03-28: 100 ug via INTRAVENOUS
  Filled 2013-03-28: qty 2

## 2013-03-28 MED ORDER — ACETAMINOPHEN 325 MG PO TABS
650.0000 mg | ORAL_TABLET | ORAL | Status: DC | PRN
  Administered 2013-03-28: 650 mg via ORAL
  Filled 2013-03-28: qty 2

## 2013-03-28 MED ORDER — METHIMAZOLE 10 MG PO TABS
20.0000 mg | ORAL_TABLET | Freq: Two times a day (BID) | ORAL | Status: DC
  Administered 2013-03-28 – 2013-03-29 (×4): 20 mg via ORAL
  Filled 2013-03-28 (×7): qty 2

## 2013-03-28 MED ORDER — LABETALOL HCL 100 MG PO TABS
50.0000 mg | ORAL_TABLET | Freq: Two times a day (BID) | ORAL | Status: DC
  Filled 2013-03-28: qty 0.5

## 2013-03-28 MED ORDER — OXYCODONE-ACETAMINOPHEN 5-325 MG PO TABS: 1.0000 | ORAL_TABLET | ORAL | Status: DC | PRN

## 2013-03-28 MED ORDER — LIDOCAINE HCL (PF) 1 % IJ SOLN
30.0000 mL | INTRAMUSCULAR | Status: DC | PRN
  Filled 2013-03-28 (×2): qty 30

## 2013-03-28 MED ORDER — LACTATED RINGERS IV SOLN: 500.0000 mL | Freq: Once | INTRAVENOUS | Status: DC

## 2013-03-28 MED ORDER — LACTATED RINGERS IV SOLN
500.0000 mL | INTRAVENOUS | Status: DC | PRN
  Administered 2013-03-28 (×2): 500 mL via INTRAVENOUS

## 2013-03-28 MED ORDER — OXYTOCIN 40 UNITS IN LACTATED RINGERS INFUSION - SIMPLE MED
62.5000 mL/h | INTRAVENOUS | Status: DC
  Filled 2013-03-28: qty 1000

## 2013-03-28 MED ORDER — OXYTOCIN BOLUS FROM INFUSION
500.0000 mL | INTRAVENOUS | Status: DC
  Administered 2013-03-28: 500 mL via INTRAVENOUS

## 2013-03-28 MED ORDER — CITRIC ACID-SODIUM CITRATE 334-500 MG/5ML PO SOLN: 30.0000 mL | ORAL | Status: DC | PRN

## 2013-03-28 MED ORDER — FENTANYL 2.5 MCG/ML BUPIVACAINE 1/10 % EPIDURAL INFUSION (WH - ANES)
INTRAMUSCULAR | Status: DC | PRN
  Administered 2013-03-28: 14 mL/h via EPIDURAL

## 2013-03-28 MED ORDER — PHENYLEPHRINE 40 MCG/ML (10ML) SYRINGE FOR IV PUSH (FOR BLOOD PRESSURE SUPPORT)
80.0000 ug | PREFILLED_SYRINGE | INTRAVENOUS | Status: DC | PRN
  Filled 2013-03-28: qty 2
  Filled 2013-03-28: qty 10

## 2013-03-28 MED ORDER — FLEET ENEMA 7-19 GM/118ML RE ENEM: 1.0000 | ENEMA | RECTAL | Status: DC | PRN

## 2013-03-28 MED ORDER — PHENYLEPHRINE 40 MCG/ML (10ML) SYRINGE FOR IV PUSH (FOR BLOOD PRESSURE SUPPORT)
80.0000 ug | PREFILLED_SYRINGE | INTRAVENOUS | Status: DC | PRN
  Filled 2013-03-28: qty 10
  Filled 2013-03-28: qty 2

## 2013-03-28 MED ORDER — TERBUTALINE SULFATE 1 MG/ML IJ SOLN: 0.2500 mg | Freq: Once | INTRAMUSCULAR | Status: AC | PRN

## 2013-03-28 MED ORDER — ONDANSETRON HCL 4 MG/2ML IJ SOLN: 4.0000 mg | Freq: Four times a day (QID) | INTRAMUSCULAR | Status: DC | PRN

## 2013-03-28 MED ORDER — IBUPROFEN 600 MG PO TABS
600.0000 mg | ORAL_TABLET | Freq: Four times a day (QID) | ORAL | Status: DC | PRN
  Filled 2013-03-28: qty 1

## 2013-03-28 MED ORDER — LABETALOL HCL 100 MG PO TABS
50.0000 mg | ORAL_TABLET | Freq: Two times a day (BID) | ORAL | Status: DC
  Administered 2013-03-28 – 2013-03-30 (×5): 50 mg via ORAL
  Filled 2013-03-28 (×7): qty 0.5

## 2013-03-28 MED ORDER — FENTANYL 2.5 MCG/ML BUPIVACAINE 1/10 % EPIDURAL INFUSION (WH - ANES)
14.0000 mL/h | INTRAMUSCULAR | Status: DC | PRN
  Administered 2013-03-28: 14 mL/h via EPIDURAL
  Filled 2013-03-28: qty 125

## 2013-03-28 MED ORDER — LIDOCAINE HCL (PF) 1 % IJ SOLN
INTRAMUSCULAR | Status: DC | PRN
  Administered 2013-03-28 (×2): 9 mL

## 2013-03-28 MED ORDER — DIPHENHYDRAMINE HCL 50 MG/ML IJ SOLN: 12.5000 mg | INTRAMUSCULAR | Status: DC | PRN

## 2013-03-28 NOTE — Progress Notes (Signed)
Grace [name] is a 27 y.o. [id number] at [redacted]w[redacted]d admitted for IOL due to [facility]  Subjective: Pt reports some intermittent pressure, otherwise without complaint.   Objective: BP 124/66  Pulse 84  Temp(Src) 97.6 F (36.4 C) (Oral)  Resp 18  Ht 5' 3.5" (1.613 m)  Wt 83.915 kg (185 lb)  BMI 32.25 kg/m2  SpO2 100%  LMP 06/28/2012      FHT:  FHR: 120 bpm, variability: moderate,  accelerations:  Present,  decelerations:  Present Early and intermittent variables UC:   irregular, every 2-3 minutes SVE:   Dilation: 9 Effacement (%): 90 Station: 0 Exam by:: e. poore, rn  Labs: Lab Results  Component Value Date   WBC 7.4 03/28/2013   HGB 9.5* 03/28/2013   HCT 29.3* 03/28/2013   MCV 78.6 03/28/2013   PLT 319 03/28/2013    Assessment / Plan: Induction of labor due to chronic HTN,  progressing well on pitocin  Labor: Progressing well with good contraction pattern on pitocin, s/p AROM and epidural Preeclampsia:  no signs or symptoms of toxicity and labs stable Fetal Wellbeing:  Category II Pain Control:  Epidural I/D:  GBS neg Anticipated MOD:  NSVD Chronic HTN: BP stable, labs stable, continuing labetalol.   [name] 03/28/2013, 9:16 PM

## 2013-03-28 NOTE — Progress Notes (Signed)
Grace [name] is a 27 y.o. [id number] at [redacted]w[redacted]d admitted for induction of labor due to Hypertension.  Subjective: S/p AROM, and epidural. Pt is comfortable wihtout complaint  Objective: BP 120/81  Pulse 81  Temp(Src) 97.6 F (36.4 C) (Axillary)  Resp 16  Ht 5' 3.5" (1.613 m)  Wt 83.915 kg (185 lb)  BMI 32.25 kg/m2  SpO2 100%  LMP 06/28/2012      Filed Vitals:   03/28/13 1816 03/28/13 1817 03/28/13 1821 03/28/13 1831  BP: 125/86  121/84 120/81  Pulse: 87 86 84 81  Temp:      TempSrc:      Resp: 16  16 16   Height:      Weight:      SpO2:  100%       FHT:  FHR: 120s bpm, variability: moderate,  accelerations:  Present,  decelerations:  Absent UC:   regular, every 2 minutes SVE:   Dilation: 6 Effacement (%): 60 Station: -3 Exam by:: Dr. Ike [name]  Labs: Lab Results  Component Value Date   WBC 7.4 03/28/2013   HGB 9.5* 03/28/2013   HCT 29.3* 03/28/2013   MCV 78.6 03/28/2013   PLT 319 03/28/2013   CMP     Component Value Date/Time   NA 135 03/28/2013 0750   K 3.8 03/28/2013 0750   CL 105 03/28/2013 0750   CO2 19 03/28/2013 0750   GLUCOSE 71 03/28/2013 0750   BUN 5* 03/28/2013 0750   CREATININE 0.62 03/28/2013 0750   CREATININE 0.54 03/14/2013 1137   CREATININE 0.54 03/14/2013 0916   CALCIUM 8.4 03/28/2013 0750   PROT 6.0 03/28/2013 0750   ALBUMIN 2.4* 03/28/2013 0750   AST 34 03/28/2013 0750   ALT 15 03/28/2013 0750   ALKPHOS 148* 03/28/2013 0750   BILITOT 0.5 03/28/2013 0750   GFRNONAA >90 03/28/2013 0750   GFRAA >90 03/28/2013 0750   Pr: Cr = 0.12   Assessment / Plan: Induction of labor due to Chronic HTN,  progressing well on pitocin  Labor: Progressing normally and on pitocin at 14mU. good contraction pattern. S/p AROM Preeclampsia:  no signs or symptoms of toxicity and labs stable Fetal Wellbeing:  Category I Pain Control:  Epidural I/D:  n/a Anticipated MOD:  NSVD HTN: BP stable mild range, labs reassuring  Grace [name] 03/28/2013,  6:45 PM

## 2013-03-28 NOTE — H&P (Signed)
Grace [name] is a 27 y.o. female presenting for IOL for CHTN.  She has no acute complaints. Denies LOF, VB, discharge. She reports GFM and irregular mild contractions. She denies HA, vision changes, RUQ/epigastric pain, and increased swelling.   She has received [facility] due to chronic medical conditions of HTN and hyperthyroidism diagnosed during this pregnancy. She is on labetalol and methimazole. Glucola was 150, but 3hr GTT was normal (69/155/138/128). She was hospitalized for hyperemesis gravidarum at 12 weeks.   History OB History   Grav Para Term Preterm Abortions TAB SAB Ect Mult Living   4 2 2  1  1   2      Past Medical History  Diagnosis Date  . Sickle cell trait   . Headache(784.0)   . Hyperthyroidism   . Sickle cell trait   . Chronic kidney disease     recurrent UTIs  . GERD (gastroesophageal reflux disease)   . Cholecystitis 08/18/2011   Past Surgical History  Procedure Laterality Date  . Dilation and curettage of uterus    . Cholecystectomy  08/19/2011    Procedure: LAPAROSCOPIC CHOLECYSTECTOMY WITH INTRAOPERATIVE CHOLANGIOGRAM;  Surgeon: Lodema Pilot, DO;  Location: [facility] OR;  Service: General;  Laterality: N/A;   Family History: family history includes Asthma in her son; Cancer in her father, maternal grandfather, and maternal grandmother; Diabetes in her maternal grandfather, maternal uncle, and mother; Hearing loss in her paternal aunt; Heart disease in her paternal grandfather; Hypertension in her maternal grandfather; Sickle cell anemia in her son. Social History:  reports that she has never smoked. She has never used smokeless tobacco. She reports that she does not drink alcohol or use illicit drugs.  ROS Per HPI, otherwise complete ROS is negative.   Dilation: 2 Effacement (%): 50 Station: -2 Exam by:: Gounz Blood pressure 142/90, pulse 92, temperature 98.1 F (36.7 C), temperature source Oral, resp. rate 18, height 5' 3.5" (1.613 m), weight 83.915 kg  (185 lb), last menstrual period 06/28/2012. Maternal Exam:  Introitus: Vulva is negative for lesion.  Vagina is negative for discharge.    Physical Exam  Constitutional: She is oriented to person, place, and time. She appears well-developed and well-nourished.  HENT:  Head: Normocephalic.  Mouth/Throat: Oropharynx is clear and moist.  Eyes: EOM are normal. Pupils are equal, round, and reactive to light.  Neck: Normal range of motion. Neck supple.  Cardiovascular: Normal rate, regular rhythm and normal heart sounds.   Respiratory: Effort normal and breath sounds normal. No respiratory distress.  GI: Soft. There is no tenderness.  Genitourinary: Vulva exhibits no lesion. No vaginal discharge found.  Musculoskeletal: Normal range of motion.  Neurological: She is alert and oriented to person, place, and time.  Skin: Skin is warm.    FHT:  Baseline 130, moderate variability, 15 x 15 accelerations present, no decelerations Toco:  Irregular contractions  Prenatal labs: ABO, Rh: A/POS/-- (07/10 1610) Antibody: NEG (07/10 0852) Rubella: 3.14 (07/10 0852) RPR: NON REAC (10/02 1146)  HBsAg: NEGATIVE (07/10 0852)  HIV: NON REACTIVE (10/02 1146)  GBS: Negative (11/20 0000)   Assessment/Plan: RONITA HARGREAVES is a 27 y.o. [id number] at [redacted]w[redacted]d by 12wk U/S here for IOL due to [facility].   #Labor: Begin pitocin, proceed 1x1 #Chronic HTN: Continue labetalol 50mg  po BID, obtain CBC, CMP, UPr:Cr #Hyperthyroidism: Continue methimazole #Pain: Undecided, may have epidural upon request #FWB: Category I #ID:  GBS neg #MOF: Breast and bottle #MOC: Depo vs. sterilization Anticipate NSVD  Jarvis Newcomer,  Ryan 03/28/2013, 7:59 AM  I have seen the patient with the resident/student and agree with the above.  Tawnya Crook

## 2013-03-28 NOTE — Anesthesia Preprocedure Evaluation (Signed)
Anesthesia Evaluation  Patient identified by MRN, date of birth, ID band Patient awake    Reviewed: Allergy & Precautions, H&P , NPO status , Patient's Chart, lab work & pertinent test results  Airway Mallampati: II TM Distance: >3 FB Neck ROM: full    Dental no notable dental hx.    Pulmonary neg pulmonary ROS,    Pulmonary exam normal       Cardiovascular     Neuro/Psych negative neurological ROS  negative psych ROS   GI/Hepatic Neg liver ROS,   Endo/Other    Renal/GU      Musculoskeletal   Abdominal Normal abdominal exam  (+)   Peds  Hematology negative hematology ROS (+)   Anesthesia Other Findings   Reproductive/Obstetrics (+) Pregnancy                           Anesthesia Physical Anesthesia Plan  ASA: II  Anesthesia Plan: Epidural   Post-op Pain Management:    Induction:   Airway Management Planned:   Additional Equipment:   Intra-op Plan:   Post-operative Plan:   Informed Consent: I have reviewed the patients History and Physical, chart, labs and discussed the procedure including the risks, benefits and alternatives for the proposed anesthesia with the patient or authorized representative who has indicated his/her understanding and acceptance.     Plan Discussed with:   Anesthesia Plan Comments:         Anesthesia Quick Evaluation

## 2013-03-28 NOTE — Anesthesia Procedure Notes (Signed)
Epidural Patient location during procedure: OB Start time: 03/28/2013 5:42 PM End time: 03/28/2013 5:46 PM  Staffing Anesthesiologist: Leilani Able Performed by: anesthesiologist   Preanesthetic Checklist Completed: patient identified, surgical consent, pre-op evaluation, timeout performed, IV checked, risks and benefits discussed and monitors and equipment checked  Epidural Patient position: sitting Prep: site prepped and draped and DuraPrep Patient monitoring: continuous pulse ox and blood pressure Approach: midline Injection technique: LOR air  Needle:  Needle type: Tuohy  Needle gauge: 17 G Needle length: 9 cm and 9 Needle insertion depth: 5 cm cm Catheter type: closed end flexible Catheter size: 19 Gauge Catheter at skin depth: 10 cm Test dose: negative and Other  Assessment Sensory level: T9 Events: blood not aspirated, injection not painful, no injection resistance, negative IV test and no paresthesia  Additional Notes Reason for block:procedure for pain

## 2013-03-28 NOTE — Progress Notes (Signed)
Grace [name] is a 27 y.o. [id number] at [redacted]w[redacted]d admitted for induction of labor due to Hypertension.  Subjective: Pt doing well, feeling ctx but light  Objective: BP 142/89  Pulse 90  Temp(Src) 98.1 F (36.7 C) (Oral)  Resp 16  Ht 5' 3.5" (1.613 m)  Wt 185 lb (83.915 kg)  BMI 32.25 kg/m2  LMP 06/28/2012      FHT:  FHR: 120s bpm, variability: moderate,  accelerations:  Abscent,  decelerations:  Present deep decel to 80s with slow recovery over 2 minutes. not recurrent UC:   regular, every 4 minutes SVE:   Dilation: 2 Effacement (%): 50 Station: -2 Exam by:: Gounz  Labs: Lab Results  Component Value Date   WBC 8.1 03/28/2013   HGB 9.2* 03/28/2013   HCT 27.8* 03/28/2013   MCV 78.1 03/28/2013   PLT 320 03/28/2013   CMP     Component Value Date/Time   NA 135 03/28/2013 0750   K 3.8 03/28/2013 0750   CL 105 03/28/2013 0750   CO2 19 03/28/2013 0750   GLUCOSE 71 03/28/2013 0750   BUN 5* 03/28/2013 0750   CREATININE 0.62 03/28/2013 0750   CREATININE 0.54 03/14/2013 1137   CREATININE 0.54 03/14/2013 0916   CALCIUM 8.4 03/28/2013 0750   PROT 6.0 03/28/2013 0750   ALBUMIN 2.4* 03/28/2013 0750   AST 34 03/28/2013 0750   ALT 15 03/28/2013 0750   ALKPHOS 148* 03/28/2013 0750   BILITOT 0.5 03/28/2013 0750   GFRNONAA >90 03/28/2013 0750   GFRAA >90 03/28/2013 0750   Pr: Cr = 0.12   Assessment / Plan: Induction of labor due to Chronic HTN,  progressing well on pitocin  Labor: Progressing normally and on pitocin at 6mU. Will continue to increase Preeclampsia:  no signs or symptoms of toxicity and labs stable Fetal Wellbeing:  Category II Pain Control:  Epidural and Fentanyl when needed I/D:  n/a Anticipated MOD:  NSVD HTN: BP stable mild range, labs reassuring  Grace [name] 03/28/2013, 12:03 PM

## 2013-03-28 NOTE — Progress Notes (Signed)
Grace [name] is a 27 y.o. [id number] at [redacted]w[redacted]d admitted for induction of labor due to Hypertension.  Subjective: Pt doing well, feeling ctx but light  Objective: BP 131/82  Pulse 91  Temp(Src) 98 F (36.7 C) (Oral)  Resp 16  Ht 5' 3.5" (1.613 m)  Wt 83.915 kg (185 lb)  BMI 32.25 kg/m2  SpO2 100%  LMP 06/28/2012      Filed Vitals:   03/28/13 1314 03/28/13 1345 03/28/13 1415 03/28/13 1455  BP: 131/82  136/84 131/82  Pulse: 94  86 91  Temp:      TempSrc:      Resp: 16 16 16 16   Height:      Weight:      SpO2:         FHT:  FHR: 130s bpm, variability: moderate,  accelerations:  Present,  decelerations:  Present deep decel to 80s with slow recovery over 2 minutes. total of 3 over last 6hrs. Following with good variability UC:   regular, every 3 minutes SVE:   Dilation: 4 Effacement (%): 60 Station: -3 Exam by:: Dr. Ike Bene  Labs: Lab Results  Component Value Date   WBC 8.1 03/28/2013   HGB 9.2* 03/28/2013   HCT 27.8* 03/28/2013   MCV 78.1 03/28/2013   PLT 320 03/28/2013   CMP     Component Value Date/Time   NA 135 03/28/2013 0750   K 3.8 03/28/2013 0750   CL 105 03/28/2013 0750   CO2 19 03/28/2013 0750   GLUCOSE 71 03/28/2013 0750   BUN 5* 03/28/2013 0750   CREATININE 0.62 03/28/2013 0750   CREATININE 0.54 03/14/2013 1137   CREATININE 0.54 03/14/2013 0916   CALCIUM 8.4 03/28/2013 0750   PROT 6.0 03/28/2013 0750   ALBUMIN 2.4* 03/28/2013 0750   AST 34 03/28/2013 0750   ALT 15 03/28/2013 0750   ALKPHOS 148* 03/28/2013 0750   BILITOT 0.5 03/28/2013 0750   GFRNONAA >90 03/28/2013 0750   GFRAA >90 03/28/2013 0750   Pr: Cr = 0.12   Assessment / Plan: Induction of labor due to Chronic HTN,  progressing well on pitocin  Labor: Progressing normally and on pitocin at 14mU. Will continue to increase Preeclampsia:  no signs or symptoms of toxicity and labs stable Fetal Wellbeing:  Category II Pain Control:  Epidural and Fentanyl I/D:  n/a Anticipated MOD:   NSVD HTN: BP stable mild range, labs reassuring  [place] Boyett RYAN 03/28/2013, 3:44 PM

## 2013-03-28 NOTE — H&P (Signed)
Attestation of Attending Supervision of Advanced Practitioner (CNM/NP): Evaluation and management procedures were performed by the Advanced Practitioner under my supervision and collaboration.  I have reviewed the Advanced Practitioner's note and chart, and I agree with the management and plan.  Tyden Kann 03/28/2013 8:50 AM

## 2013-03-29 DIAGNOSIS — O10919 Unspecified pre-existing hypertension complicating pregnancy, unspecified trimester: Secondary | ICD-10-CM | POA: Diagnosis present

## 2013-03-29 LAB — CBC
Hemoglobin: 9.3 g/dL — ABNORMAL LOW (ref 12.0–15.0)
MCH: 26 pg (ref 26.0–34.0)
MCHC: 33 g/dL (ref 30.0–36.0)
MCV: 78.8 fL (ref 78.0–100.0)
WBC: 14.3 10*3/uL — ABNORMAL HIGH (ref 4.0–10.5)

## 2013-03-29 MED ORDER — SIMETHICONE 80 MG PO CHEW: 80.0000 mg | CHEWABLE_TABLET | ORAL | Status: DC | PRN

## 2013-03-29 MED ORDER — DIPHENHYDRAMINE HCL 25 MG PO CAPS: 25.0000 mg | ORAL_CAPSULE | Freq: Four times a day (QID) | ORAL | Status: DC | PRN

## 2013-03-29 MED ORDER — BENZOCAINE-MENTHOL 20-0.5 % EX AERO
1.0000 "application " | INHALATION_SPRAY | CUTANEOUS | Status: DC | PRN
  Administered 2013-03-29: 1 via TOPICAL
  Filled 2013-03-29: qty 56

## 2013-03-29 MED ORDER — DIBUCAINE 1 % RE OINT: 1.0000 "application " | TOPICAL_OINTMENT | RECTAL | Status: DC | PRN

## 2013-03-29 MED ORDER — TETANUS-DIPHTH-ACELL PERTUSSIS 5-2.5-18.5 LF-MCG/0.5 IM SUSP: 0.5000 mL | Freq: Once | INTRAMUSCULAR | Status: DC

## 2013-03-29 MED ORDER — ONDANSETRON HCL 4 MG PO TABS: 4.0000 mg | ORAL_TABLET | ORAL | Status: DC | PRN

## 2013-03-29 MED ORDER — PRENATAL MULTIVITAMIN CH
1.0000 | ORAL_TABLET | Freq: Every day | ORAL | Status: DC
  Administered 2013-03-29: 1 via ORAL
  Filled 2013-03-29: qty 1

## 2013-03-29 MED ORDER — ONDANSETRON HCL 4 MG/2ML IJ SOLN: 4.0000 mg | INTRAMUSCULAR | Status: DC | PRN

## 2013-03-29 MED ORDER — LANOLIN HYDROUS EX OINT: TOPICAL_OINTMENT | CUTANEOUS | Status: DC | PRN

## 2013-03-29 MED ORDER — ZOLPIDEM TARTRATE 5 MG PO TABS: 5.0000 mg | ORAL_TABLET | Freq: Every evening | ORAL | Status: DC | PRN

## 2013-03-29 MED ORDER — IBUPROFEN 600 MG PO TABS
600.0000 mg | ORAL_TABLET | Freq: Four times a day (QID) | ORAL | Status: DC
  Administered 2013-03-29 (×5): 600 mg via ORAL
  Filled 2013-03-29 (×6): qty 1

## 2013-03-29 MED ORDER — WITCH HAZEL-GLYCERIN EX PADS: 1.0000 "application " | MEDICATED_PAD | CUTANEOUS | Status: DC | PRN

## 2013-03-29 MED ORDER — SENNOSIDES-DOCUSATE SODIUM 8.6-50 MG PO TABS
2.0000 | ORAL_TABLET | ORAL | Status: DC
  Administered 2013-03-29: 2 via ORAL
  Filled 2013-03-29: qty 2

## 2013-03-29 NOTE — Anesthesia Postprocedure Evaluation (Signed)
  Anesthesia Post-op Note  Patient: Grace [name]  Procedure(s) Performed: * No procedures listed *  Patient Location: Mother/Baby  Anesthesia Type:Epidural  Level of Consciousness: awake, alert  and oriented  Airway and Oxygen Therapy: Patient Spontanous Breathing  Post-op Pain: none  Post-op Assessment: Post-op Vital signs reviewed  Post-op Vital Signs: Reviewed and stable  Complications: No apparent anesthesia complications

## 2013-03-29 NOTE — Progress Notes (Signed)
UR completed 

## 2013-03-29 NOTE — Progress Notes (Signed)
F Dishmon CNM called and discussed pt"s blood pressures.  Instructed to give labelol as ordered

## 2013-03-29 NOTE — Progress Notes (Signed)
Post Partum Day #1 Subjective: Pain well controlled, bleeding equal to normal menses, eating, drinking, voiding, + flatus. Baby formula feeding well.   Objective: Blood pressure 128/76, pulse 98, temperature 98.5 F (36.9 C), temperature source Oral, resp. rate 18, height 5' 3.5" (1.613 m), weight 83.915 kg (185 lb), last menstrual period 06/28/2012, SpO2 99.00%, unknown if currently breastfeeding.  Physical Exam:  General: alert, cooperative and no distress Lochia: appropriate Uterine Fundus: firm Incision: N/A DVT Evaluation: No evidence of DVT seen on physical exam. No cords or calf tenderness. No significant calf/ankle edema.   Recent Labs  03/28/13 1557 03/29/13 0545  HGB 9.5* 9.3*  HCT 29.3* 28.2*   Assessment/Plan: Grace [name] is a 27 y.o. now [id number] PPD#1 s/p NSVD at [redacted]w[redacted]d after IOL for HTN  - Doing well.  - Formula feeding - Interval BTL (papers signed 12/2) - Circumcision as outpatient - Continue labetalol for chronic HTN, good BP control postpartum.  - Continue methimazole for hyperthyroidism - Desires discharge tomorrow    LOS: 1 day   [name] 03/29/2013, 7:24 AM   I have seen and examined this patient and agree with above documentation in the resident's note. Plan for depo and interval tubal.   Rulon Abide, M.D. [facility] Fellow 03/29/2013 7:49 AM

## 2013-03-30 ENCOUNTER — Other Ambulatory Visit: Payer: Medicaid Other

## 2013-03-30 MED ORDER — IBUPROFEN 600 MG PO TABS: 600.0000 mg | ORAL_TABLET | Freq: Four times a day (QID) | ORAL | Status: DC

## 2013-03-30 NOTE — Discharge Summary (Signed)
Obstetric Discharge Summary Reason for Admission: induction of labor for chronic hypertension Prenatal Procedures: Preeclampsia labs Intrapartum Procedures: spontaneous vaginal delivery Postpartum Procedures: none Complications-Operative and Postpartum: none Hemoglobin  Date Value Range Status  03/29/2013 9.3* 12.0 - 15.0 g/dL Final     HCT  Date Value Range Status  03/29/2013 28.2* 36.0 - 46.0 % Final  Brief Hospital Course: Patient was admitted for induction of labor at [redacted]w[redacted]d for chronic hypertension on the morning of 03/28/13. She received pitocin for induction and progressed well. At 1840 her membranes were artificially ruptured. Her pain was managed with an epidural. She delivered a viable female without any complications. She was continued on labetalol and methimazole and prescribed prior to admission with control of blood pressure and no signs/symptoms of pre-eclampsia. On day of discharge, her pain is controlled on ibuprofen; her bleeding is minimal; she is ambulating well, tolerating PO, urinating and passing flatus.  Physical Exam:  General: alert, cooperative and no distress Lochia: appropriate Uterine Fundus: firm Incision: n/a DVT Evaluation: No evidence of DVT seen on physical exam. Negative Homan's sign. No significant calf/ankle edema.  Discharge Diagnoses: Term Pregnancy-delivered and chronic hypertension and hyperthyroidism  Discharge Information: Date: 03/30/2013 Activity: pelvic rest Diet: routine Medications: PNV, Ibuprofen, labetalol & methimazole Condition: stable Instructions: refer to practice specific booklet Discharge to: home Bottle-feeding and pumping Interval tubal to be scheduled (consent signed 03/20/13) A+, rubella immune, TDaP given  Newborn Data: Live born female  Birth Weight: 7 lb 2.8 oz (3255 g) APGAR: 9, 9  Home with mother.  Pior, Jearld Lesch 03/30/2013, 8:51 AM  I have seen this patient and agree with the above resident's note.   Message sent to schedule pt interval tubal at 4-6 weeks.  Pt notified that she will receive a letter with date/time/details regarding surgery and pt given clinic phone number to call with questions or changes.  LEFTWICH-KIRBY, Olivya Sobol Certified Nurse-Midwife

## 2013-04-27 ENCOUNTER — Encounter (HOSPITAL_COMMUNITY): Payer: Self-pay

## 2013-05-04 ENCOUNTER — Ambulatory Visit: Payer: Medicaid Other | Admitting: Obstetrics & Gynecology

## 2013-05-07 NOTE — Patient Instructions (Addendum)
   Your procedure is scheduled on: Wednesday, Jan 21  [facility] at: 7 AM Pick up the phone at the desk and dial [phone] and inform us of your arrival.  Please call this number if you have any problems the morning of surgery: [phone]  Remember: Do not eat or drink after midnight: Tuesday: Take these medicines the morning of surgery with a SIP OF WATER:  Labetalol, methimazole  Do not wear jewelry, make-up, or FINGER nail polish No metal in your hair or on your body. Do not wear lotions, powders, perfumes.  You may wear deodorant.  Do not bring valuables to the hospital. Contacts, dentures or bridgework may not be worn into surgery.  Patients discharged on the day of surgery will not be allowed to drive home.  Home with sister Misty StanleyLisa cell [phone].

## 2013-05-08 ENCOUNTER — Encounter (HOSPITAL_COMMUNITY)
Admission: RE | Admit: 2013-05-08 | Discharge: 2013-05-08 | Disposition: A | Discharge status: Home / Self Care | Payer: Medicaid Other | Source: Ambulatory Visit | Attending: Obstetrics & Gynecology | Admitting: Obstetrics & Gynecology

## 2013-05-08 ENCOUNTER — Encounter (HOSPITAL_COMMUNITY): Payer: Self-pay

## 2013-05-08 DIAGNOSIS — Z302 Encounter for sterilization: Secondary | ICD-10-CM | POA: Diagnosis present

## 2013-05-08 HISTORY — DX: Pain in unspecified joint: M25.50

## 2013-05-08 HISTORY — DX: Personal history of urinary (tract) infections: Z87.440

## 2013-05-08 HISTORY — DX: Essential (primary) hypertension: I10

## 2013-05-08 LAB — BASIC METABOLIC PANEL
BUN: 7 mg/dL (ref 6–23)
CHLORIDE: 105 meq/L (ref 96–112)
CO2: 26 meq/L (ref 19–32)
CREATININE: 0.66 mg/dL (ref 0.50–1.10)
Calcium: 9.1 mg/dL (ref 8.4–10.5)
GFR calc Af Amer: >90 mL/min (ref >90)
GFR calc non Af Amer: >90 mL/min (ref >90)
Glucose, Bld: 98 mg/dL (ref 70–99)
Potassium: 4.4 mEq/L (ref 3.7–5.3)
Sodium: 141 mEq/L (ref 137–147)

## 2013-05-08 LAB — CBC
HCT: 33.4 % — ABNORMAL LOW (ref 36.0–46.0)
Hemoglobin: 11 g/dL — ABNORMAL LOW (ref 12.0–15.0)
MCH: 26.3 pg (ref 26.0–34.0)
MCHC: 32.9 g/dL (ref 30.0–36.0)
MCV: 79.7 fL (ref 78.0–100.0)
Platelets: 330 10*3/uL (ref 150–400)
RBC: 4.19 MIL/uL (ref 3.87–5.11)
RDW: 16.2 % — ABNORMAL HIGH (ref 11.5–15.5)
WBC: 7.4 10*3/uL (ref 4.0–10.5)

## 2013-05-08 NOTE — Pre-Procedure Instructions (Signed)
Dr Malen GauzeFoster informed of patient's bp at PAT appt.  Dr Malen GauzeFoster instructed me to tell patient to take 100 mg labetalol today-Tuesday and Wednesday am dose.  Patient dose is usually 50 mg labetalol.  Patient verified understanding of increasing labetalol dose.

## 2013-05-09 ENCOUNTER — Ambulatory Visit (HOSPITAL_COMMUNITY): Payer: Medicaid Other | Admitting: Anesthesiology

## 2013-05-09 ENCOUNTER — Encounter (HOSPITAL_COMMUNITY): Payer: Self-pay | Admitting: *Deleted

## 2013-05-09 ENCOUNTER — Encounter (HOSPITAL_COMMUNITY)
Admission: RE | Disposition: A | Discharge status: Home / Self Care | Payer: Self-pay | Source: Ambulatory Visit | Attending: Obstetrics & Gynecology

## 2013-05-09 ENCOUNTER — Encounter: Payer: Self-pay | Admitting: *Deleted

## 2013-05-09 ENCOUNTER — Ambulatory Visit (HOSPITAL_COMMUNITY)
Admission: RE | Admit: 2013-05-09 | Discharge: 2013-05-09 | Disposition: A | Discharge status: Home / Self Care | Payer: Medicaid Other | Source: Ambulatory Visit | Attending: Obstetrics & Gynecology | Admitting: Obstetrics & Gynecology

## 2013-05-09 ENCOUNTER — Encounter (HOSPITAL_COMMUNITY): Payer: Medicaid Other | Admitting: Anesthesiology

## 2013-05-09 DIAGNOSIS — Z3009 Encounter for other general counseling and advice on contraception: Secondary | ICD-10-CM

## 2013-05-09 DIAGNOSIS — Z302 Encounter for sterilization: Secondary | ICD-10-CM | POA: Diagnosis not present

## 2013-05-09 HISTORY — PX: LAPAROSCOPIC BILATERAL SALPINGECTOMY: SHX5889

## 2013-05-09 LAB — PREGNANCY, URINE: Preg Test, Ur: NEGATIVE

## 2013-05-09 SURGERY — SALPINGECTOMY, BILATERAL, LAPAROSCOPIC
Anesthesia: General | Site: Abdomen | Laterality: Bilateral

## 2013-05-09 MED ORDER — BUPIVACAINE HCL (PF) 0.5 % IJ SOLN
INTRAMUSCULAR | Status: DC | PRN
  Administered 2013-05-09: 10 mL

## 2013-05-09 MED ORDER — NEOSTIGMINE METHYLSULFATE 1 MG/ML IJ SOLN
INTRAMUSCULAR | Status: AC
  Filled 2013-05-09: qty 1

## 2013-05-09 MED ORDER — ONDANSETRON HCL 4 MG/2ML IJ SOLN
INTRAMUSCULAR | Status: AC
  Filled 2013-05-09: qty 2

## 2013-05-09 MED ORDER — IBUPROFEN 800 MG PO TABS: 800.0000 mg | ORAL_TABLET | Freq: Three times a day (TID) | ORAL | Status: DC | PRN

## 2013-05-09 MED ORDER — OXYCODONE-ACETAMINOPHEN 5-325 MG PO TABS: 1.0000 | ORAL_TABLET | ORAL | Status: DC | PRN

## 2013-05-09 MED ORDER — GLYCOPYRROLATE 0.2 MG/ML IJ SOLN
INTRAMUSCULAR | Status: DC | PRN
  Administered 2013-05-09: 0.6 mg via INTRAVENOUS

## 2013-05-09 MED ORDER — ACETAMINOPHEN 160 MG/5ML PO SOLN
ORAL | Status: AC
  Administered 2013-05-09: 975 mg via ORAL
  Filled 2013-05-09: qty 20.3

## 2013-05-09 MED ORDER — NEOSTIGMINE METHYLSULFATE 1 MG/ML IJ SOLN
INTRAMUSCULAR | Status: DC | PRN
  Administered 2013-05-09: 4 mg via INTRAVENOUS

## 2013-05-09 MED ORDER — PROPOFOL 10 MG/ML IV EMUL
INTRAVENOUS | Status: AC
  Filled 2013-05-09: qty 20

## 2013-05-09 MED ORDER — FENTANYL CITRATE 0.05 MG/ML IJ SOLN
INTRAMUSCULAR | Status: DC | PRN
  Administered 2013-05-09: 50 ug via INTRAVENOUS
  Administered 2013-05-09: 100 ug via INTRAVENOUS

## 2013-05-09 MED ORDER — LACTATED RINGERS IV SOLN
INTRAVENOUS | Status: DC
  Administered 2013-05-09 (×3): via INTRAVENOUS

## 2013-05-09 MED ORDER — MIDAZOLAM HCL 2 MG/2ML IJ SOLN
INTRAMUSCULAR | Status: AC
  Filled 2013-05-09: qty 2

## 2013-05-09 MED ORDER — DEXAMETHASONE SODIUM PHOSPHATE 10 MG/ML IJ SOLN
INTRAMUSCULAR | Status: DC | PRN
  Administered 2013-05-09: 10 mg via INTRAVENOUS

## 2013-05-09 MED ORDER — BUPIVACAINE HCL (PF) 0.5 % IJ SOLN
INTRAMUSCULAR | Status: AC
  Filled 2013-05-09: qty 30

## 2013-05-09 MED ORDER — MIDAZOLAM HCL 2 MG/2ML IJ SOLN
INTRAMUSCULAR | Status: DC | PRN
  Administered 2013-05-09: 2 mg via INTRAVENOUS

## 2013-05-09 MED ORDER — GLYCOPYRROLATE 0.2 MG/ML IJ SOLN
INTRAMUSCULAR | Status: AC
  Filled 2013-05-09: qty 3

## 2013-05-09 MED ORDER — KETOROLAC TROMETHAMINE 30 MG/ML IJ SOLN
INTRAMUSCULAR | Status: AC
  Filled 2013-05-09: qty 1

## 2013-05-09 MED ORDER — ONDANSETRON HCL 4 MG/2ML IJ SOLN
INTRAMUSCULAR | Status: DC | PRN
  Administered 2013-05-09: 4 mg via INTRAVENOUS

## 2013-05-09 MED ORDER — ACETAMINOPHEN 160 MG/5ML PO SOLN
975.0000 mg | Freq: Once | ORAL | Status: AC
  Administered 2013-05-09: 975 mg via ORAL

## 2013-05-09 MED ORDER — PROPOFOL 10 MG/ML IV BOLUS
INTRAVENOUS | Status: DC | PRN
  Administered 2013-05-09: 200 mg via INTRAVENOUS

## 2013-05-09 MED ORDER — DEXAMETHASONE SODIUM PHOSPHATE 10 MG/ML IJ SOLN
INTRAMUSCULAR | Status: AC
  Filled 2013-05-09: qty 1

## 2013-05-09 MED ORDER — LIDOCAINE HCL (CARDIAC) 20 MG/ML IV SOLN: INTRAVENOUS | Status: DC | PRN

## 2013-05-09 MED ORDER — KETOROLAC TROMETHAMINE 30 MG/ML IJ SOLN
INTRAMUSCULAR | Status: DC | PRN
  Administered 2013-05-09: 30 mg via INTRAVENOUS

## 2013-05-09 MED ORDER — ROCURONIUM BROMIDE 100 MG/10ML IV SOLN
INTRAVENOUS | Status: DC | PRN
  Administered 2013-05-09: 40 mg via INTRAVENOUS

## 2013-05-09 MED ORDER — FENTANYL CITRATE 0.05 MG/ML IJ SOLN
INTRAMUSCULAR | Status: AC
  Filled 2013-05-09: qty 5

## 2013-05-09 MED ORDER — LIDOCAINE HCL (CARDIAC) 20 MG/ML IV SOLN
INTRAVENOUS | Status: DC | PRN
  Administered 2013-05-09: 60 mg via INTRAVENOUS

## 2013-05-09 MED ORDER — LIDOCAINE HCL (CARDIAC) 20 MG/ML IV SOLN
INTRAVENOUS | Status: AC
  Filled 2013-05-09: qty 5

## 2013-05-09 SURGICAL SUPPLY — 33 items
APPLICATOR COTTON TIP 6IN STRL (MISCELLANEOUS) ×3 IMPLANT
BAG SPEC RTRVL LRG 6X4 10 (ENDOMECHANICALS)
CABLE HIGH FREQUENCY MONO STRZ (ELECTRODE) IMPLANT
CATH ROBINSON RED A/P 16FR (CATHETERS) ×3 IMPLANT
CHLORAPREP W/TINT 26ML (MISCELLANEOUS) ×3 IMPLANT
CLOSURE WOUND 1/2 X4 (GAUZE/BANDAGES/DRESSINGS) ×1
CLOTH BEACON ORANGE TIMEOUT ST (SAFETY) ×3 IMPLANT
DRSG COVADERM PLUS 2X2 (GAUZE/BANDAGES/DRESSINGS) ×9 IMPLANT
ELECT REM PT RETURN 9FT ADLT (ELECTROSURGICAL) ×3
ELECTRODE REM PT RTRN 9FT ADLT (ELECTROSURGICAL) IMPLANT
FORCEPS CUTTING 33CM 5MM (CUTTING FORCEPS) IMPLANT
GLOVE BIO SURGEON STRL SZ 6.5 (GLOVE) ×2 IMPLANT
GLOVE BIO SURGEONS STRL SZ 6.5 (GLOVE) ×1
GOWN PREVENTION PLUS LG XLONG (DISPOSABLE) ×6 IMPLANT
NDL SAFETY ECLIPSE 18X1.5 (NEEDLE) ×1 IMPLANT
NEEDLE HYPO 18GX1.5 SHARP (NEEDLE) ×3
NEEDLE INSUFFLATION 120MM (ENDOMECHANICALS) ×3 IMPLANT
NEEDLE INSUFFLATION 150MM (ENDOMECHANICALS) ×2 IMPLANT
NS IRRIG 1000ML POUR BTL (IV SOLUTION) ×3 IMPLANT
PACK LAPAROSCOPY BASIN (CUSTOM PROCEDURE TRAY) ×3 IMPLANT
POUCH SPECIMEN RETRIEVAL 10MM (ENDOMECHANICALS) IMPLANT
PROTECTOR NERVE ULNAR (MISCELLANEOUS) ×3 IMPLANT
SCALPEL HARMONIC ACE (MISCELLANEOUS) ×3 IMPLANT
SET IRRIG TUBING LAPAROSCOPIC (IRRIGATION / IRRIGATOR) IMPLANT
STRIP CLOSURE SKIN 1/2X4 (GAUZE/BANDAGES/DRESSINGS) ×2 IMPLANT
SUT VICRYL 0 UR6 27IN ABS (SUTURE) ×3 IMPLANT
SUT VICRYL 4-0 PS2 18IN ABS (SUTURE) ×6 IMPLANT
TOWEL OR 17X24 6PK STRL BLUE (TOWEL DISPOSABLE) ×6 IMPLANT
TROCAR OPTI TIP 5M 100M (ENDOMECHANICALS) ×6 IMPLANT
TROCAR XCEL DIL TIP R 11M (ENDOMECHANICALS) ×3 IMPLANT
TROCAR XCEL NON-BLD 5MMX100MML (ENDOMECHANICALS) IMPLANT
WARMER LAPAROSCOPE (MISCELLANEOUS) ×3 IMPLANT
WATER STERILE IRR 1000ML POUR (IV SOLUTION) ×3 IMPLANT

## 2013-05-09 NOTE — Op Note (Signed)
05/09/2013  9:08 AM  PATIENT:  Grace [name]  28 y.o. female  PRE-OPERATIVE DIAGNOSIS:  Undesired fertility  POST-OPERATIVE DIAGNOSIS:  Undesired fertility  PROCEDURE:  Procedure(s): LAPAROSCOPIC BILATERAL SALPINGECTOMY (Bilateral)  SURGEON:  Surgeon(s) and Role:    * Allie BossierMyra C Hanae Waiters, MD - Primary  PHYSICIAN ASSISTANT:   ASSISTANTS: none   ANESTHESIA:   general  EBL:  Total I/O In: 1000 [I.V.:1000] Out: -   BLOOD ADMINISTERED:none  DRAINS: none   LOCAL MEDICATIONS USED:  MARCAINE     SPECIMEN:  Source of Specimen:  bilateral oviducts  DISPOSITION OF SPECIMEN:  PATHOLOGY  COUNTS:  YES  TOURNIQUET:  * No tourniquets in log *  DICTATION: .Dragon Dictation  PLAN OF CARE: Discharge to home after PACU  PATIENT DISPOSITION:  PACU - hemodynamically stable.   Delay start of Pharmacological VTE agent (>24hrs) due to surgical blood loss or risk of bleeding: not applicable  The risks, benefits, and alternatives of surgery were explained, understood, accepted. She is certain that she wants permanent sterility. She understands that this is not reversible. She understands there is a small failure rate of this procedure. She also would like to have both oviducts removed her due newest recommendations to help prevent ovarian/peritoneal cancer. In the operating room she was placed in the dorsal lithotomy position, and general anesthesia was given without complication. Her abdomen and vagina were prepped and draped in the usual sterile fashion. A timeout procedure was done. A bimanual exam revealed a small anteverted and mobile uterus. Her adnexa felt normal. A Hulka manipulator was placed. Her bladder was emptied with a Robinson catheter. Gloves were changed, and attention was turned to the abdomen. Approximately the 5 mL of 0.5% Marcaine was injected into the umbilicus. A vertical incision was made at the site. A varies needle was placed intraperitoneally. Low-flow CO2 was used to  insufflate the abdomen to approximately 3-1/2 L. Once a good pneumoperitoneum was established, a 10 mm Excel trocar was placed. Laparoscopy confirmed correct placement. A 5 mm port was placed in each lower quadrant under direct laparoscopic visualization after injecting 0.5% Marcaine in the incision sites. Her pelvis and upper abdomen appeared normal. A Harmonic scapel was used to hemostatically removed both oviducts. I switched to a 5 mm camera and removed the oviducts through the umbilical port.  I removed the 5 mm ports and noted hemostasis. The umbilical fascia was closed with a 0 vicryl suture. No defects were palpable. A subcuticular closure was done with 4-0 Vicryl suture at all incision sites. A Steri-Strip was placed across each incision. She was extubated and taken to the recovery room in stable condition.

## 2013-05-09 NOTE — Anesthesia Postprocedure Evaluation (Signed)
  Anesthesia Post-op Note  Patient: Grace [name]  Procedure(s) Performed: Procedure(s): LAPAROSCOPIC BILATERAL SALPINGECTOMY (Bilateral) Patient is awake and responsive. Pain and nausea are reasonably well controlled. Vital signs are stable and clinically acceptable. Oxygen saturation is clinically acceptable. There are no apparent anesthetic complications at this time. Patient is ready for discharge.

## 2013-05-09 NOTE — Anesthesia Procedure Notes (Signed)
Procedure Name: Intubation Date/Time: 05/09/2013 8:34 AM Performed by: Minami Arriaga, Jannet AskewHARLESETTA M Pre-anesthesia Checklist: Patient identified, Patient being monitored, Emergency Drugs available, Timeout performed and Suction available Patient Re-evaluated:Patient Re-evaluated prior to inductionOxygen Delivery Method: Circle system utilized Preoxygenation: Pre-oxygenation with 100% oxygen Intubation Type: IV induction Ventilation: Mask ventilation without difficulty Laryngoscope Size: Mac Grade View: Grade III Tube type: Oral Tube size: 7.0 mm Number of attempts: 1 Placement Confirmation: ETT inserted through vocal cords under direct vision,  positive ETCO2 and breath sounds checked- equal and bilateral Secured at: 21 cm Dental Injury: Teeth and Oropharynx as per pre-operative assessment

## 2013-05-09 NOTE — H&P (Signed)
Grace [name] is an 28 y.o. female. She wants a interval tubal excision for permanent sterility.  Pertinent Gynecological History: Menses: flow is moderate Bleeding: normal Contraception: none DES exposure: denies Blood transfusions: none Sexually transmitted diseases: no past history Previous GYN Procedures: none    Menstrual History: No LMP recorded.    Past Medical History  Diagnosis Date  . Sickle cell trait   . Hyperthyroidism   . Sickle cell trait   . Cholecystitis 08/18/2011  . SVD (spontaneous vaginal delivery)     x 3 WH  . Hypertension   . History of recurrent UTIs     recurrent UTIs  . Headache(784.0)     otc med prn  . Joint pain     knees and shoulders - otc med prn    Past Surgical History  Procedure Laterality Date  . Cholecystectomy  08/19/2011    Procedure: LAPAROSCOPIC CHOLECYSTECTOMY WITH INTRAOPERATIVE CHOLANGIOGRAM;  Surgeon: Lodema PilotBrian Layton, DO;  Location: [facility] OR;  Service: General;  Laterality: N/A;  . Dilation and curettage of uterus      molar preg    Family History  Problem Relation Age of Onset  . Diabetes Mother   . Cancer Father   . Diabetes Maternal Uncle   . Cancer Maternal Grandmother   . Cancer Maternal Grandfather   . Hypertension Maternal Grandfather   . Diabetes Maternal Grandfather   . Sickle cell anemia Son   . Asthma Son   . Heart disease Paternal Grandfather   . Hearing loss Paternal Aunt     Social History:  reports that she has never smoked. She has never used smokeless tobacco. She reports that she does not drink alcohol or use illicit drugs.  Allergies: No Known Allergies  Prescriptions prior to admission  Medication Sig Dispense Refill  . labetalol (NORMODYNE) 100 MG tablet Take 0.5 tablets (50 mg total) by mouth 2 (two) times daily.  60 tablet  0  . methimazole (TAPAZOLE) 10 MG tablet Take 2 tablets (20 mg total) by mouth 2 (two) times daily.  120 tablet  3    ROS  Blood pressure 144/99, pulse 98,  temperature 98.1 F (36.7 C), temperature source Oral, resp. rate 20, SpO2 100.00%, not currently breastfeeding. Physical Exam Heart- rrr Lungs- CTAB Abd- benign  Results for orders placed during the hospital encounter of 05/09/13 (from the past 24 hour(s))  PREGNANCY, URINE     Status: None   Collection Time    05/09/13  7:00 AM      Result Value Range   Preg Test, Ur NEGATIVE  NEGATIVE    No results found.  Assessment/Plan: Desires sterility- plan for excision of tubes. She knows that this is not reversible.  She understands the risks of surgery, including, but not to infection, bleeding, DVTs, damage to bowel, bladder, ureters. She wishes to proceed.    Elenore Wanninger C. 05/09/2013, 8:19 AM

## 2013-05-09 NOTE — Anesthesia Preprocedure Evaluation (Signed)
Anesthesia Evaluation  Patient identified by MRN, date of birth, ID band Patient awake    Reviewed: Allergy & Precautions, H&P , Patient's Chart, lab work & pertinent test results, reviewed documented beta blocker date and time   Airway Mallampati: II TM Distance: >3 FB Neck ROM: full    Dental no notable dental hx.    Pulmonary  breath sounds clear to auscultation  Pulmonary exam normal       Cardiovascular hypertension, On Medications and On Home Beta Blockers Rhythm:regular Rate:Normal     Neuro/Psych    GI/Hepatic   Endo/Other  Hyperthyroidism   Renal/GU      Musculoskeletal   Abdominal   Peds  Hematology   Anesthesia Other Findings   Reproductive/Obstetrics                           Anesthesia Physical Anesthesia Plan  ASA: II  Anesthesia Plan: General   Post-op Pain Management:    Induction: Intravenous  Airway Management Planned: Oral ETT  Additional Equipment:   Intra-op Plan:   Post-operative Plan: Extubation in OR  Informed Consent: I have reviewed the patients History and Physical, chart, labs and discussed the procedure including the risks, benefits and alternatives for the proposed anesthesia with the patient or authorized representative who has indicated his/her understanding and acceptance.   Dental Advisory Given and Dental advisory given  Plan Discussed with: CRNA and Surgeon  Anesthesia Plan Comments: (  Discussed general anesthesia, including possible nausea, instrumentation of airway, sore throat,pulmonary aspiration, etc. I asked if the were any outstanding questions, or  concerns before we proceeded. )        Anesthesia Quick Evaluation

## 2013-05-09 NOTE — Transfer of Care (Signed)
Immediate Anesthesia Transfer of Care Note  Patient: Grace [name]  Procedure(s) Performed: Procedure(s): LAPAROSCOPIC BILATERAL SALPINGECTOMY (Bilateral)  Patient Location: PACU  Anesthesia Type:General  Level of Consciousness: awake, alert  and oriented  Airway & Oxygen Therapy: Patient Spontanous Breathing and Patient connected to nasal cannula oxygen  Post-op Assessment: Report given to PACU RN and Post -op Vital signs reviewed and stable  Post vital signs: Reviewed and stable  Complications: No apparent anesthesia complications

## 2013-05-09 NOTE — Discharge Instructions (Signed)
DISCHARGE INSTRUCTIONS: Laparoscopy  MAY TAKE IBUPROFEN (MOTRIN, ADVIL) OR ALEVE AFTER 3:00 PM FOR CRAMPS!!  The following instructions have been prepared to help you care for yourself upon your return home today.  Wound care:  Do not get the incision wet for the first 24 hours. The incision should be kept clean and dry.  The Band-Aids or dressings may be removed the day after surgery.  Should the incision become sore, red, and swollen after the first week, check with your doctor.  Personal hygiene:  Shower the day after your procedure.  Activity and limitations:  Do NOT drive or operate any equipment today.  Do NOT lift anything more than 15 pounds for 2-3 weeks after surgery.  Do NOT rest in bed all day.  Walking is encouraged. Walk each day, starting slowly with 5-minute walks 3 or 4 times a day. Slowly increase the length of your walks.  Walk up and down stairs slowly.  Do NOT do strenuous activities, such as golfing, playing tennis, bowling, running, biking, weight lifting, gardening, mowing, or vacuuming for 2-4 weeks. Ask your doctor when it is okay to start.  Diet: Eat a light meal as desired this evening. You may resume your usual diet tomorrow.  Return to work: This is dependent on the type of work you do. For the most part you can return to a desk job within a week of surgery. If you are more active at work, please discuss this with your doctor.  What to expect after your surgery: You may have a slight burning sensation when you urinate on the first day. You may have a very small amount of blood in the urine. Expect to have a small amount of vaginal discharge/light bleeding for 1-2 weeks. It is not unusual to have abdominal soreness and bruising for up to 2 weeks. You may be tired and need more rest for about 1 week. You may experience shoulder pain for 24-72 hours. Lying flat in bed may relieve it.  Call your doctor for any of the following:  Develop a fever of  100.4 or greater  Inability to urinate 6 hours after discharge from hospital  Severe pain not relieved by pain medications  Persistent of heavy bleeding at incision site  Redness or swelling around incision site after a week  Increasing nausea or vomiting  Patient Signature________________________________________ Nurse Signature_________________________________________  DO NOT HAVE UNPROTECTED SEX UNTIL AFTER NEXT MENSTRUAL PERIOD. YOU COULD GET PREGNANT!

## 2013-05-10 ENCOUNTER — Encounter (HOSPITAL_COMMUNITY): Payer: Self-pay | Admitting: Obstetrics & Gynecology

## 2013-06-19 ENCOUNTER — Encounter: Payer: Self-pay | Admitting: *Deleted

## 2013-08-22 ENCOUNTER — Encounter (HOSPITAL_COMMUNITY): Payer: Self-pay | Admitting: Emergency Medicine

## 2013-08-22 ENCOUNTER — Emergency Department (INDEPENDENT_AMBULATORY_CARE_PROVIDER_SITE_OTHER)
Admission: EM | Admit: 2013-08-22 | Discharge: 2013-08-22 | Disposition: A | Discharge status: Home / Self Care | Payer: Medicaid Other | Source: Home / Self Care | Attending: Emergency Medicine | Admitting: Emergency Medicine

## 2013-08-22 DIAGNOSIS — N3000 Acute cystitis without hematuria: Secondary | ICD-10-CM

## 2013-08-22 LAB — POCT URINALYSIS DIP (DEVICE)
Bilirubin Urine: NEGATIVE
GLUCOSE, UA: NEGATIVE mg/dL
Ketones, ur: NEGATIVE mg/dL
NITRITE: NEGATIVE
PROTEIN: 100 mg/dL — AB
SPECIFIC GRAVITY, URINE: 1.02 (ref 1.005–1.030)
UROBILINOGEN UA: 0.2 mg/dL (ref 0.0–1.0)
pH: 6 (ref 5.0–8.0)

## 2013-08-22 LAB — POCT PREGNANCY, URINE: PREG TEST UR: NEGATIVE

## 2013-08-22 MED ORDER — PHENAZOPYRIDINE HCL 200 MG PO TABS: 200.0000 mg | ORAL_TABLET | Freq: Three times a day (TID) | ORAL | Status: DC | PRN

## 2013-08-22 MED ORDER — CEPHALEXIN 500 MG PO CAPS: 500.0000 mg | ORAL_CAPSULE | Freq: Three times a day (TID) | ORAL | Status: DC

## 2013-08-22 NOTE — ED Provider Notes (Signed)
Chief Complaint   No chief complaint on file.   History of Present Illness   Grace [name] is a 28 year old female who's had a one-week history of dysuria, frequency, urgency, blood in the urine, and older of urine. She denies any fever, chills, nausea, vomiting, back pain, or abdominal pain. She's had no GYN symptoms. She had a urinary tract infection with her pregnancy but none since then.  Review of Systems   Other than as noted above, the patient denies any of the following symptoms: General:  No fevers or chills. GI:  No abdominal pain, back pain, nausea, or vomiting. GU:  No hematuria or incontinence. GYN:  No discharge, itching, vulvar pain or lesions, pelvic pain, or abnormal vaginal bleeding.  PMFSH   Past medical history, family history, social history, meds, and allergies were reviewed.  She has hyperthyroidism and is on labetalol and methimazole.  Physical Examination     Vital signs:  BP 135/94  Pulse 91  Temp(Src) 98.5 F (36.9 C) (Oral)  Resp 18  SpO2 100% Gen:  Alert, oriented, in no distress. Lungs:  Clear to auscultation, no wheezes, rales or rhonchi. Heart:  Regular rhythm, no gallop or murmer. Abdomen:  Flat and soft. There was slight suprapubic pain to palpation.  No guarding, or rebound.  No hepato-splenomegaly or mass.  Bowel sounds were normally active.  No hernia. Back:  No CVA tenderness.  Skin:  Clear, warm and dry.  Labs   Results for orders placed during the hospital encounter of 08/22/13  POCT URINALYSIS DIP (DEVICE)      Result Value Ref Range   Glucose, UA NEGATIVE  NEGATIVE mg/dL   Bilirubin Urine NEGATIVE  NEGATIVE   Ketones, ur NEGATIVE  NEGATIVE mg/dL   Specific Gravity, Urine 1.020  1.005 - 1.030   Hgb urine dipstick TRACE (*) NEGATIVE   pH 6.0  5.0 - 8.0   Protein, ur 100 (*) NEGATIVE mg/dL   Urobilinogen, UA 0.2  0.0 - 1.0 mg/dL   Nitrite NEGATIVE  NEGATIVE   Leukocytes, UA SMALL (*) NEGATIVE  POCT PREGNANCY, URINE   Result Value Ref Range   Preg Test, Ur NEGATIVE  NEGATIVE     A urine culture was obtained.  Results are pending at this time and we will call about any positive results.  Assessment   The encounter diagnosis was Acute cystitis.   No evidence of pyelonephritis.   Plan   1.  Meds:  The following meds were prescribed:   New Prescriptions   CEPHALEXIN (KEFLEX) 500 MG CAPSULE    Take 1 capsule (500 mg total) by mouth 3 (three) times daily.   PHENAZOPYRIDINE (PYRIDIUM) 200 MG TABLET    Take 1 tablet (200 mg total) by mouth 3 (three) times daily as needed for pain.    2.  Patient Education/Counseling:  The patient was given appropriate handouts, self care instructions, and instructed in symptomatic relief. The patient was told to avoid intercourse for 10 days, get extra fluids, and return for a follow up with her primary care doctor at the completion of treatment for a repeat UA and culture.    3.  Follow up:  The patient was told to follow up here if no better in 3 to 4 days, or sooner if becoming worse in any way, and given some red flag symptoms such as fever, persistent vomiting, or severe flank or abdominal pain which would prompt immediate return.     Reuben Likesavid C Shiah Berhow, MD  08/22/13 1108 

## 2013-08-22 NOTE — ED Notes (Signed)
Seen by MD only

## 2013-08-22 NOTE — Discharge Instructions (Signed)
Urinary Tract Infection  Urinary tract infections (UTIs) can develop anywhere along your urinary tract. Your urinary tract is your body's drainage system for removing wastes and extra water. Your urinary tract includes two kidneys, two ureters, a bladder, and a urethra. Your kidneys are a pair of bean-shaped organs. Each kidney is about the size of your fist. They are located below your ribs, one on each side of your spine.  CAUSES  Infections are caused by microbes, which are microscopic organisms, including fungi, viruses, and bacteria. These organisms are so small that they can only be seen through a microscope. Bacteria are the microbes that most commonly cause UTIs.  SYMPTOMS   Symptoms of UTIs may vary by age and gender of the patient and by the location of the infection. Symptoms in young women typically include a frequent and intense urge to urinate and a painful, burning feeling in the bladder or urethra during urination. Older women and men are more likely to be tired, shaky, and weak and have muscle aches and abdominal pain. A fever may mean the infection is in your kidneys. Other symptoms of a kidney infection include pain in your back or sides below the ribs, nausea, and vomiting.  DIAGNOSIS  To diagnose a UTI, your caregiver will ask you about your symptoms. Your caregiver also will ask to provide a urine sample. The urine sample will be tested for bacteria and white blood cells. White blood cells are made by your body to help fight infection.  TREATMENT   Typically, UTIs can be treated with medication. Because most UTIs are caused by a bacterial infection, they usually can be treated with the use of antibiotics. The choice of antibiotic and length of treatment depend on your symptoms and the type of bacteria causing your infection.  HOME CARE INSTRUCTIONS   If you were prescribed antibiotics, take them exactly as your caregiver instructs you. Finish the medication even if you feel better after you  have only taken some of the medication.   Drink enough water and fluids to keep your urine clear or pale yellow.   Avoid caffeine, tea, and carbonated beverages. They tend to irritate your bladder.   Empty your bladder often. Avoid holding urine for long periods of time.   Empty your bladder before and after sexual intercourse.   After a bowel movement, women should cleanse from front to back. Use each tissue only once.  SEEK MEDICAL CARE IF:    You have back pain.   You develop a fever.   Your symptoms do not begin to resolve within 3 days.  SEEK IMMEDIATE MEDICAL CARE IF:    You have severe back pain or lower abdominal pain.   You develop chills.   You have nausea or vomiting.   You have continued burning or discomfort with urination.  MAKE SURE YOU:    Understand these instructions.   Will watch your condition.   Will get help right away if you are not doing well or get worse.  Document Released: 01/13/2005 Document Revised: 10/05/2011 Document Reviewed: 05/14/2011  Exit[employer] Patient Information 2014 [employer].

## 2013-08-24 LAB — URINE CULTURE
Colony Count: >=100000
SPECIAL REQUESTS: NORMAL

## 2013-08-24 NOTE — Progress Notes (Signed)
Quick Note:  Results are abnormal as noted, but have been adequately treated. No further action necessary. ______ 

## 2013-08-25 NOTE — ED Notes (Signed)
Urine culture: >100,000 colonies E. Coli.  Pt. adequately treated with Keflex. Grace [name] Senteno 08/25/2013

## 2013-12-07 ENCOUNTER — Encounter (HOSPITAL_COMMUNITY): Payer: Self-pay | Admitting: Emergency Medicine

## 2013-12-07 ENCOUNTER — Emergency Department (HOSPITAL_COMMUNITY)
Admission: EM | Admit: 2013-12-07 | Discharge: 2013-12-07 | Disposition: A | Discharge status: Home / Self Care | Payer: Medicaid Other | Attending: Emergency Medicine | Admitting: Emergency Medicine

## 2013-12-07 DIAGNOSIS — R51 Headache: Secondary | ICD-10-CM | POA: Insufficient documentation

## 2013-12-07 DIAGNOSIS — M62838 Other muscle spasm: Secondary | ICD-10-CM | POA: Insufficient documentation

## 2013-12-07 DIAGNOSIS — I10 Essential (primary) hypertension: Secondary | ICD-10-CM | POA: Insufficient documentation

## 2013-12-07 DIAGNOSIS — E0590 Thyrotoxicosis, unspecified without thyrotoxic crisis or storm: Secondary | ICD-10-CM | POA: Diagnosis not present

## 2013-12-07 DIAGNOSIS — M542 Cervicalgia: Secondary | ICD-10-CM | POA: Insufficient documentation

## 2013-12-07 DIAGNOSIS — Z87440 Personal history of urinary (tract) infections: Secondary | ICD-10-CM | POA: Insufficient documentation

## 2013-12-07 DIAGNOSIS — Z79899 Other long term (current) drug therapy: Secondary | ICD-10-CM | POA: Insufficient documentation

## 2013-12-07 DIAGNOSIS — Z862 Personal history of diseases of the blood and blood-forming organs and certain disorders involving the immune mechanism: Secondary | ICD-10-CM | POA: Insufficient documentation

## 2013-12-07 MED ORDER — CYCLOBENZAPRINE HCL 10 MG PO TABS: 10.0000 mg | ORAL_TABLET | Freq: Two times a day (BID) | ORAL | Status: DC | PRN

## 2013-12-07 MED ORDER — HYDROCODONE-ACETAMINOPHEN 5-325 MG PO TABS: 1.0000 | ORAL_TABLET | Freq: Four times a day (QID) | ORAL | Status: DC | PRN

## 2013-12-07 MED ORDER — NAPROXEN 500 MG PO TABS: 500.0000 mg | ORAL_TABLET | Freq: Two times a day (BID) | ORAL | Status: DC

## 2013-12-07 NOTE — Discharge Instructions (Signed)
Muscle Cramps and Spasms °Muscle cramps and spasms occur when a muscle or muscles tighten and you have no control over this tightening (involuntary muscle contraction). They are a common problem and can develop in any muscle. The most common place is in the calf muscles of the leg. Both muscle cramps and muscle spasms are involuntary muscle contractions, but they also have differences:  °· Muscle cramps are sporadic and painful. They may last a few seconds to a quarter of an hour. Muscle cramps are often more forceful and last longer than muscle spasms. °· Muscle spasms may or may not be painful. They may also last just a few seconds or much longer. °CAUSES  °It is uncommon for cramps or spasms to be due to a serious underlying problem. In many cases, the cause of cramps or spasms is unknown. Some common causes are:  °· Overexertion.   °· Overuse from repetitive motions (doing the same thing over and over).   °· Remaining in a certain position for a long period of time.   °· Improper preparation, form, or technique while performing a sport or activity.   °· Dehydration.   °· Injury.   °· Side effects of some medicines.   °· Abnormally low levels of the salts and ions in your blood (electrolytes), especially potassium and calcium. This could happen if you are taking water pills (diuretics) or you are pregnant.   °Some underlying medical problems can make it more likely to develop cramps or spasms. These include, but are not limited to:  °· Diabetes.   °· Parkinson disease.   °· Hormone disorders, such as thyroid problems.   °· Alcohol abuse.   °· Diseases specific to muscles, joints, and bones.   °· Blood vessel disease where not enough blood is getting to the muscles.   °HOME CARE INSTRUCTIONS  °· Stay well hydrated. Drink enough water and fluids to keep your urine clear or pale yellow. °· It may be helpful to massage, stretch, and relax the affected muscle. °· For tight or tense muscles, use a warm towel, heating  pad, or hot shower water directed to the affected area. °· If you are sore or have pain after a cramp or spasm, applying ice to the affected area may relieve discomfort. °¨ Put ice in a plastic bag. °¨ Place a towel between your skin and the bag. °¨ Leave the ice on for 15-20 minutes, 03-04 times a day. °· Medicines used to treat a known cause of cramps or spasms may help reduce their frequency or severity. Only take over-the-counter or prescription medicines as directed by your caregiver. °SEEK MEDICAL CARE IF:  °Your cramps or spasms get more severe, more frequent, or do not improve over time.  °MAKE SURE YOU:  °· Understand these instructions. °· Will watch your condition. °· Will get help right away if you are not doing well or get worse. °Document Released: 09/25/2001 Document Revised: 07/31/2012 Document Reviewed: 03/22/2012 °Exit[employer]® Patient Information ©2015 [employer]. This information is not intended to replace advice given to you by your health care provider. Make sure you discuss any questions you have with your health care provider. °Heat Therapy °Heat therapy can help ease sore, stiff, injured, and tight muscles and joints. Heat relaxes your muscles, which may help ease your pain.  °RISKS AND COMPLICATIONS °If you have any of the following conditions, do not use heat therapy unless your health care provider has approved: °· Poor circulation. °· Healing wounds or scarred skin in the area being treated. °· Diabetes, heart disease, or high blood   pressure. °· Not being able to feel (numbness) the area being treated. °· Unusual swelling of the area being treated. °· Active infections. °· Blood clots. °· Cancer. °· Inability to communicate pain. This may include young children and people who have problems with their brain function (dementia). °· Pregnancy. °Heat therapy should only be used on old, pre-existing, or long-lasting (chronic) injuries. Do not use heat therapy on new injuries unless directed by  your health care provider. °HOW TO USE HEAT THERAPY °There are several different kinds of heat therapy, including: °· Moist heat pack. °· Warm water bath. °· Hot water bottle. °· Electric heating pad. °· Heated gel pack. °· Heated wrap. °· Electric heating pad. °Use the heat therapy method suggested by your health care provider. Follow your health care provider's instructions on when and how to use heat therapy. °GENERAL HEAT THERAPY RECOMMENDATIONS °· Do not sleep while using heat therapy. Only use heat therapy while you are awake. °· Your skin may turn pink while using heat therapy. Do not use heat therapy if your skin turns red. °· Do not use heat therapy if you have new pain. °· High heat or long exposure to heat can cause burns. Be careful when using heat therapy to avoid burning your skin. °· Do not use heat therapy on areas of your skin that are already irritated, such as with a rash or sunburn. °SEEK MEDICAL CARE IF: °· You have blisters, redness, swelling, or numbness. °· You have new pain. °· Your pain is worse. °MAKE SURE YOU: °· Understand these instructions. °· Will watch your condition. °· Will get help right away if you are not doing well or get worse. °Document Released: 06/28/2011 Document Revised: 08/20/2013 Document Reviewed: 05/29/2013 °[employer]® Patient Information ©2015 [employer]. This information is not intended to replace advice given to you by your health care provider. Make sure you discuss any questions you have with your health care provider. ° °

## 2013-12-07 NOTE — ED Notes (Signed)
Pt c/o neck pain on and off x 2 weeks, worse last 3 days. No known injury. Denies numbness or tingling.

## 2013-12-07 NOTE — ED Notes (Signed)
Pt here with neck pain and stiffness, not associated with any trauma, no fever.  Pain off and on for some time, more in the last 3 days

## 2013-12-07 NOTE — ED Provider Notes (Signed)
CSN: [id number]     Arrival date & time 12/07/13  0850 History  This chart was scribed for non-physician practitioner working Arthor Captain, PA-C with No att. providers found by Leone Payor, ED Scribe. This patient was seen in room TR09C/TR09C and the patient's care was started at 9:33 AM.    Chief Complaint  Patient presents with  . Neck Pain    The history is provided by the patient. No language interpreter was used.    HPI Comments: Grace [name] is a 28 y.o. female who presents to the Emergency Department complaining of 1 week of intermittent neck pain with associated neck stiffness which has worsened over the past 3 days. She states the pain is alleviated by rest and aggravated by movement. She states rotating her head to the right is worse than rotating to the left. She has taken OTC pain medication with minimal relief. She denies visual disturbances, fever.   Past Medical History  Diagnosis Date  . Sickle cell trait   . Hyperthyroidism   . Sickle cell trait   . Cholecystitis 08/18/2011  . SVD (spontaneous vaginal delivery)     x 3 [facility]  . Hypertension   . History of recurrent UTIs     recurrent UTIs  . Headache(784.0)     otc med prn  . Joint pain     knees and shoulders - otc med prn   Past Surgical History  Procedure Laterality Date  . Cholecystectomy  08/19/2011    Procedure: LAPAROSCOPIC CHOLECYSTECTOMY WITH INTRAOPERATIVE CHOLANGIOGRAM;  Surgeon: Lodema Pilot, DO;  Location: [facility] OR;  Service: General;  Laterality: N/A;  . Dilation and curettage of uterus      molar preg  . Laparoscopic bilateral salpingectomy Bilateral 05/09/2013    Procedure: LAPAROSCOPIC BILATERAL SALPINGECTOMY;  Surgeon: Allie Bossier, MD;  Location: [facility];  Service: Gynecology;  Laterality: Bilateral;   Family History  Problem Relation Age of Onset  . Diabetes Mother   . Cancer Father   . Diabetes Maternal Uncle   . Cancer Maternal Grandmother   . Cancer Maternal Grandfather   . Hypertension  Maternal Grandfather   . Diabetes Maternal Grandfather   . Sickle cell anemia Son   . Asthma Son   . Heart disease Paternal Grandfather   . Hearing loss Paternal Aunt    History  Substance Use Topics  . Smoking status: Never Smoker   . Smokeless tobacco: Never Used  . Alcohol Use: No   OB History   Grav Para Term Preterm Abortions TAB SAB Ect Mult Living   4 3 3  1  1   3      Review of Systems  Constitutional: Negative for fever.  Eyes: Negative for photophobia and visual disturbance.  Musculoskeletal: Positive for neck pain and neck stiffness.      Allergies  Review of patient's allergies indicates no known allergies.  Home Medications   Prior to Admission medications   Medication Sig Start Date End Date Taking? Authorizing Provider  ibuprofen (ADVIL,MOTRIN) 200 MG tablet Take 400 mg by mouth every 6 (six) hours as needed for moderate pain.   Yes Historical Provider, MD  labetalol (NORMODYNE) 100 MG tablet Take 0.5 tablets (50 mg total) by mouth 2 (two) times daily. 10/14/12  Yes Currie Paris, NP  methimazole (TAPAZOLE) 10 MG tablet Take 2 tablets (20 mg total) by mouth 2 (two) times daily. 09/30/12  Yes Ozella Rocks, MD   BP 121/83  Pulse  110  Temp(Src) 98.7 F (37.1 C) (Oral)  Resp 20  Ht 5\' 4"  (1.626 m)  Wt 145 lb (65.772 kg)  BMI 24.88 kg/m2  SpO2 99%  LMP 11/23/2013 Physical Exam  Nursing note and vitals reviewed. Constitutional: She is oriented to person, place, and time. She appears well-developed and well-nourished.  HENT:  Head: Normocephalic and atraumatic.  Neck: Neck supple. No rigidity.  Palpable spasm in the occipital muscle and trapezius. Bracing her neck. Limited ROM with right lateral rotation, better to the left. No nuchal rigidity.   Cardiovascular: Normal rate.   Pulmonary/Chest: Effort normal.  Abdominal: She exhibits no distension.  Musculoskeletal: Normal range of motion.  Neurological: She is alert and oriented to person, place,  and time.  Skin: Skin is warm and dry.  Psychiatric: She has a normal mood and affect.    ED Course  Procedures (including critical care time)  DIAGNOSTIC STUDIES: Oxygen Saturation is 99% on RA, normal by my interpretation.    COORDINATION OF CARE: 9:37 AM Discussed treatment plan with pt at bedside and pt agreed to plan.   Labs Review Labs Reviewed - No data to display  Imaging Review No results found.   EKG Interpretation None      MDM   Final diagnoses:  Neck muscle spasm   Patient with spasm of neck. No fever, headache or nuchal rigidity.  Consistent with spasm of the splenius cervicis muscle.  Discharge with Norco, Flexeril, naproxen and referral to physical therapy and massage therapy.   I personally performed the services described in this documentation, which was scribed in my presence. The recorded information has been reviewed and is accurate.    Arthor CaptainAbigail Yoona Ishii, PA-C 12/07/13 0946  Arthor CaptainAbigail Kerria Sapien, PA-C 12/07/13 [id number]

## 2013-12-08 NOTE — ED Provider Notes (Signed)
Medical screening examination/treatment/procedure(s) were performed by non-physician practitioner and as supervising physician I was immediately available for consultation/collaboration.   EKG Interpretation None        Hyrum Shaneyfelt, DO 12/08/13 1610 

## 2014-01-11 ENCOUNTER — Encounter (HOSPITAL_COMMUNITY): Payer: Self-pay | Admitting: Emergency Medicine

## 2014-01-11 ENCOUNTER — Emergency Department (HOSPITAL_COMMUNITY)
Admission: EM | Admit: 2014-01-11 | Discharge: 2014-01-11 | Disposition: A | Discharge status: Home / Self Care | Payer: Medicaid Other | Attending: Emergency Medicine | Admitting: Emergency Medicine

## 2014-01-11 DIAGNOSIS — I498 Other specified cardiac arrhythmias: Secondary | ICD-10-CM | POA: Insufficient documentation

## 2014-01-11 DIAGNOSIS — N390 Urinary tract infection, site not specified: Secondary | ICD-10-CM

## 2014-01-11 DIAGNOSIS — E0590 Thyrotoxicosis, unspecified without thyrotoxic crisis or storm: Secondary | ICD-10-CM

## 2014-01-11 DIAGNOSIS — I10 Essential (primary) hypertension: Secondary | ICD-10-CM | POA: Diagnosis not present

## 2014-01-11 DIAGNOSIS — Z79899 Other long term (current) drug therapy: Secondary | ICD-10-CM | POA: Diagnosis not present

## 2014-01-11 DIAGNOSIS — M545 Low back pain, unspecified: Secondary | ICD-10-CM | POA: Diagnosis not present

## 2014-01-11 DIAGNOSIS — R300 Dysuria: Secondary | ICD-10-CM | POA: Diagnosis present

## 2014-01-11 DIAGNOSIS — Z791 Long term (current) use of non-steroidal anti-inflammatories (NSAID): Secondary | ICD-10-CM | POA: Insufficient documentation

## 2014-01-11 DIAGNOSIS — Z3202 Encounter for pregnancy test, result negative: Secondary | ICD-10-CM | POA: Diagnosis not present

## 2014-01-11 DIAGNOSIS — R110 Nausea: Secondary | ICD-10-CM

## 2014-01-11 DIAGNOSIS — D649 Anemia, unspecified: Secondary | ICD-10-CM | POA: Diagnosis not present

## 2014-01-11 DIAGNOSIS — R509 Fever, unspecified: Secondary | ICD-10-CM | POA: Diagnosis not present

## 2014-01-11 DIAGNOSIS — R109 Unspecified abdominal pain: Secondary | ICD-10-CM | POA: Insufficient documentation

## 2014-01-11 DIAGNOSIS — R000 Tachycardia, unspecified: Secondary | ICD-10-CM

## 2014-01-11 DIAGNOSIS — Z8719 Personal history of other diseases of the digestive system: Secondary | ICD-10-CM | POA: Diagnosis not present

## 2014-01-11 DIAGNOSIS — R10A0 Flank pain, unspecified side: Secondary | ICD-10-CM

## 2014-01-11 LAB — URINALYSIS, ROUTINE W REFLEX MICROSCOPIC
BILIRUBIN URINE: NEGATIVE
Glucose, UA: NEGATIVE mg/dL
Hgb urine dipstick: NEGATIVE
KETONES UR: NEGATIVE mg/dL
NITRITE: NEGATIVE
PROTEIN: NEGATIVE mg/dL
SPECIFIC GRAVITY, URINE: 1.015 (ref 1.005–1.030)
Urobilinogen, UA: 0.2 mg/dL (ref 0.0–1.0)
pH: 5.5 (ref 5.0–8.0)

## 2014-01-11 LAB — CBC WITH DIFFERENTIAL/PLATELET
Basophils Absolute: 0 10*3/uL (ref 0.0–0.1)
Basophils Relative: 0 % (ref 0–1)
Eosinophils Absolute: 0.1 10*3/uL (ref 0.0–0.7)
Eosinophils Relative: 1 % (ref 0–5)
HCT: 32.8 % — ABNORMAL LOW (ref 36.0–46.0)
Hemoglobin: 10.9 g/dL — ABNORMAL LOW (ref 12.0–15.0)
LYMPHS ABS: 1.5 10*3/uL (ref 0.7–4.0)
Lymphocytes Relative: 13 % (ref 12–46)
MCH: 25.6 pg — ABNORMAL LOW (ref 26.0–34.0)
MCHC: 33.2 g/dL (ref 30.0–36.0)
MCV: 77.2 fL — ABNORMAL LOW (ref 78.0–100.0)
Monocytes Absolute: 1.1 10*3/uL — ABNORMAL HIGH (ref 0.1–1.0)
Monocytes Relative: 9 % (ref 3–12)
NEUTROS ABS: 9 10*3/uL — AB (ref 1.7–7.7)
NEUTROS PCT: 77 % (ref 43–77)
PLATELETS: 413 10*3/uL — AB (ref 150–400)
RBC: 4.25 MIL/uL (ref 3.87–5.11)
RDW: 14 % (ref 11.5–15.5)
WBC: 11.7 10*3/uL — AB (ref 4.0–10.5)

## 2014-01-11 LAB — COMPREHENSIVE METABOLIC PANEL
ALT: 6 U/L (ref 0–35)
ANION GAP: 16 — AB (ref 5–15)
AST: 15 U/L (ref 0–37)
Albumin: 4.2 g/dL (ref 3.5–5.2)
Alkaline Phosphatase: 54 U/L (ref 39–117)
BILIRUBIN TOTAL: 0.7 mg/dL (ref 0.3–1.2)
BUN: 6 mg/dL (ref 6–23)
CALCIUM: 9.1 mg/dL (ref 8.4–10.5)
CHLORIDE: 102 meq/L (ref 96–112)
CO2: 23 mEq/L (ref 19–32)
CREATININE: 0.7 mg/dL (ref 0.50–1.10)
GFR calc Af Amer: >90 mL/min (ref >90)
GFR calc non Af Amer: >90 mL/min (ref >90)
Glucose, Bld: 95 mg/dL (ref 70–99)
Potassium: 4.2 mEq/L (ref 3.7–5.3)
SODIUM: 141 meq/L (ref 137–147)
Total Protein: 8.1 g/dL (ref 6.0–8.3)

## 2014-01-11 LAB — T4, FREE: FREE T4: 1.24 ng/dL (ref 0.80–1.80)

## 2014-01-11 LAB — POC URINE PREG, ED: PREG TEST UR: NEGATIVE

## 2014-01-11 LAB — TSH: TSH: 0.247 u[IU]/mL — AB (ref 0.350–4.500)

## 2014-01-11 LAB — LIPASE, BLOOD: LIPASE: 21 U/L (ref 11–59)

## 2014-01-11 LAB — URINE MICROSCOPIC-ADD ON

## 2014-01-11 MED ORDER — MORPHINE SULFATE 4 MG/ML IJ SOLN
4.0000 mg | Freq: Once | INTRAMUSCULAR | Status: AC
  Administered 2014-01-11: 4 mg via INTRAVENOUS
  Filled 2014-01-11: qty 1

## 2014-01-11 MED ORDER — KETOROLAC TROMETHAMINE 30 MG/ML IJ SOLN
30.0000 mg | Freq: Once | INTRAMUSCULAR | Status: AC
  Administered 2014-01-11: 30 mg via INTRAVENOUS
  Filled 2014-01-11: qty 1

## 2014-01-11 MED ORDER — ONDANSETRON HCL 8 MG PO TABS
8.0000 mg | ORAL_TABLET | Freq: Three times a day (TID) | ORAL | Status: DC | PRN
Start: 2014-01-11 — End: 2014-03-27

## 2014-01-11 MED ORDER — SODIUM CHLORIDE 0.9 % IV BOLUS (SEPSIS)
1000.0000 mL | Freq: Once | INTRAVENOUS | Status: AC
  Administered 2014-01-11: 1000 mL via INTRAVENOUS

## 2014-01-11 MED ORDER — CIPROFLOXACIN HCL 500 MG PO TABS: 500.0000 mg | ORAL_TABLET | Freq: Two times a day (BID) | ORAL | Status: DC

## 2014-01-11 MED ORDER — ACETAMINOPHEN 325 MG PO TABS
650.0000 mg | ORAL_TABLET | Freq: Once | ORAL | Status: AC
  Administered 2014-01-11: 650 mg via ORAL
  Filled 2014-01-11: qty 2

## 2014-01-11 MED ORDER — NAPROXEN 500 MG PO TABS: 500.0000 mg | ORAL_TABLET | Freq: Two times a day (BID) | ORAL | Status: DC | PRN

## 2014-01-11 MED ORDER — HYDROCODONE-ACETAMINOPHEN 5-325 MG PO TABS: 1.0000 | ORAL_TABLET | Freq: Four times a day (QID) | ORAL | Status: DC | PRN

## 2014-01-11 MED ORDER — ONDANSETRON HCL 4 MG/2ML IJ SOLN
4.0000 mg | INTRAMUSCULAR | Status: AC | PRN
  Administered 2014-01-11: 4 mg via INTRAVENOUS
  Filled 2014-01-11: qty 2

## 2014-01-11 NOTE — Discharge Instructions (Signed)
Alternate with naprosyn and tylenol as needed for fever. Take naprosyn as directed as needed for inflammation and pain using norco for breakthrough pain. Do not drive or operate machinery with pain medication use. May need over-the-counter stool softener with this pain medication use. Avoid using more than 3,000mg  tylenol per day, being aware that tylenol is in your Norco prescription. Use Zofran as needed for nausea. Stay very well hydrated with plenty of water throughout the day. Take antibiotic until completed. The hospital will call you if your urine culture grows a bacteria that is resistant to Cipro (the antibiotic given to you today), but if it is sensitive you will not receive a call. Follow up with primary care physician in 1 week for recheck of ongoing symptoms but return to ER for emergent changing or worsening of symptoms. See your regular doctor for a check up on your thyroid levels as well. Please seek immediate care if you develop the following: Your symptoms are no better, or worse in 3 days. There is severe back pain or lower abdominal pain.  You develop worsening chills unrelieved with naprosyn or tylenol You have a fever.  There is nausea or vomiting.  There is continued burning or discomfort with urination.    Flank Pain Flank pain is pain in your side. The flank is the area of your side between your upper belly (abdomen) and your back. Pain in this area can be caused by many different things. HOME CARE Home care and treatment will depend on the cause of your pain.  Rest as told by your doctor.  Drink enough fluids to keep your pee (urine) clear or pale yellow.  Only take medicine as told by your doctor.  Tell your doctor about any changes in your pain.  Follow up with your doctor. GET HELP RIGHT AWAY IF:   Your pain does not get better with medicine.   You have new symptoms or your symptoms get worse.  Your pain gets worse.   You have belly (abdominal) pain.    You are short of breath.   You always feel sick to your stomach (nauseous).   You keep throwing up (vomiting).   You have puffiness (swelling) in your belly.   You feel light-headed or you pass out (faint).   You have blood in your pee.  You have a fever or lasting symptoms for more than 2-3 days.  You have a fever and your symptoms suddenly get worse. MAKE SURE YOU:   Understand these instructions.  Will watch your condition.  Will get help right away if you are not doing well or get worse. Document Released: 01/13/2008 Document Revised: 08/20/2013 Document Reviewed: 11/18/2011 [facility] Patient Information 2015 [place], [place]. This information is not intended to replace advice given to you by your health care provider. Make sure you discuss any questions you have with your health care provider.  Fever, Adult A fever is a temperature of 100.4 F (38 C) or above.  HOME CARE  Take fever medicine as told by your doctor. Do not  take aspirin for fever if you are younger than 30 years of age.  If you are given antibiotic medicine, take it as told. Finish the medicine even if you start to feel better.  Rest.  Drink enough fluids to keep your pee (urine) clear or pale yellow. Do not drink alcohol.  Take a bath or shower with room temperature water. Do not use ice water or alcohol sponge baths.  Wear  lightweight, loose clothes. GET HELP RIGHT AWAY IF:   You are short of breath or have trouble breathing.  You are very weak.  You are dizzy or you pass out (faint).  You are very thirsty or are making little or no urine.  You have new pain.  You throw up (vomit) or have watery poop (diarrhea).  You keep throwing up or having watery poop for more than 1 to 2 days.  You have a stiff neck or light bothers your eyes.  You have a skin rash.  You have a fever or problems (symptoms) that last for more than 2 to 3 days.  You have a fever and your problems quickly  get worse.  You keep throwing up the fluids you drink.  You do not feel better after 3 days.  You have new problems. MAKE SURE YOU:   Understand these instructions.  Will watch your condition.  Will get help right away if you are not doing well or get worse. Document Released: 01/13/2008 Document Revised: 06/28/2011 Document Reviewed: 02/04/2011 [facility] Patient Information 2015 [place], [place]. This information is not intended to replace advice given to you by your health care provider. Make sure you discuss any questions you have with your health care provider.  Nausea, Adult Nausea means you feel sick to your stomach or need to throw up (vomit). It may be a sign of a more serious problem. If nausea gets worse, you may throw up. If you throw up a lot, you may lose too much body fluid (dehydration). HOME CARE   Get plenty of rest.  Ask your doctor how to replace body fluid losses (rehydrate).  Eat small amounts of food. Sip liquids more often.  Take all medicines as told by your doctor. GET HELP RIGHT AWAY IF:  You have a fever.  You pass out (faint).  You keep throwing up or have blood in your throw up.  You are very weak, have dry lips or a dry mouth, or you are very thirsty (dehydrated).  You have dark or bloody poop (stool).  You have very bad chest or belly (abdominal) pain.  You do not get better after 2 days, or you get worse.  You have a headache. MAKE SURE YOU:  Understand these instructions.  Will watch your condition.  Will get help right away if you are not doing well or get worse. Document Released: 03/25/2011 Document Revised: 06/28/2011 Document Reviewed: 03/25/2011 [facility] Patient Information 2015 [place], [place]. This information is not intended to replace advice given to you by your health care provider. Make sure you discuss any questions you have with your health care provider.  Urinary Tract Infection A urinary tract infection (UTI) can  occur any place along the urinary tract. The tract includes the kidneys, ureters, bladder, and urethra. A type of germ called bacteria often causes a UTI. UTIs are often helped with antibiotic medicine.  HOME CARE   If given, take antibiotics as told by your doctor. Finish them even if you start to feel better.  Drink enough fluids to keep your pee (urine) clear or pale yellow.  Avoid tea, drinks with caffeine, and bubbly (carbonated) drinks.  Pee often. Avoid holding your pee in for a long time.  Pee before and after having sex (intercourse).  Wipe from front to back after you poop (bowel movement) if you are a woman. Use each tissue only once. GET HELP RIGHT AWAY IF:   You have back pain.  You  have lower belly (abdominal) pain.  You have chills.  You feel sick to your stomach (nauseous).  You throw up (vomit).  Your burning or discomfort with peeing does not go away.  You have a fever.  Your symptoms are not better in 3 days. MAKE SURE YOU:   Understand these instructions.  Will watch your condition.  Will get help right away if you are not doing well or get worse. Document Released: 09/22/2007 Document Revised: 12/29/2011 Document Reviewed: 11/04/2011 [facility] Patient Information 2015 [place], [place]. This information is not intended to replace advice given to you by your health care provider. Make sure you discuss any questions you have with your health care provider.  Hyperthyroidism The thyroid is a large gland located in the lower front part of your neck. The thyroid helps control metabolism. Metabolism is how your body uses food. It controls metabolism with the hormone thyroxine. When the thyroid is overactive, it produces too much hormone. When this happens, these following problems may occur:   Nervousness  Heat intolerance  Weight loss (in spite of increase food intake)  Diarrhea  Change in hair or skin texture  Palpitations (heart skipping or having  extra beats)  Tachycardia (rapid heart rate)  Loss of menstruation (amenorrhea)  Shaking of the hands CAUSES  Grave's Disease (the immune system attacks the thyroid gland). This is the most common cause.  Inflammation of the thyroid gland.  Tumor (usually benign) in the thyroid gland or elsewhere.  Excessive use of thyroid medications (both prescription and 'natural').  Excessive ingestion of Iodine. DIAGNOSIS  To prove hyperthyroidism, your caregiver may do blood tests and ultrasound tests. Sometimes the signs are hidden. It may be necessary for your caregiver to watch this illness with blood tests, either before or after diagnosis and treatment. TREATMENT Short-term treatment There are several treatments to control symptoms. Drugs called beta blockers may give some relief. Drugs that decrease hormone production will provide temporary relief in many people. These measures will usually not give permanent relief. Definitive therapy There are treatments available which can be discussed between you and your caregiver which will permanently treat the problem. These treatments range from surgery (removal of the thyroid), to the use of radioactive iodine (destroys the thyroid by radiation), to the use of antithyroid drugs (interfere with hormone synthesis). The first two treatments are permanent and usually successful. They most often require hormone replacement therapy for life. This is because it is impossible to remove or destroy the exact amount of thyroid required to make a person euthyroid (normal). HOME CARE INSTRUCTIONS  See your caregiver if the problems you are being treated for get worse. Examples of this would be the problems listed above. SEEK MEDICAL CARE IF: Your general condition worsens. MAKE SURE YOU:   Understand these instructions.  Will watch your condition.  Will get help right away if you are not doing well or get worse. Document Released: 04/05/2005 Document  Revised: 06/28/2011 Document Reviewed: 08/17/2006 [facility] Patient Information 2015 [place], [place]. This information is not intended to replace advice given to you by your health care provider. Make sure you discuss any questions you have with your health care provider.

## 2014-01-11 NOTE — ED Notes (Signed)
Pt drinking water and eating crackers

## 2014-01-11 NOTE — ED Notes (Signed)
Pt reports sudden onset overnight of midlow back pain. Pt reports subjective fever overnight, some dysuria. States hx of hyperthyroid, did take daily medications today. States felt dizzy while standing in the shower today.

## 2014-01-11 NOTE — ED Provider Notes (Signed)
CSN: [id number]     Arrival date & time 01/11/14  1116 History   First MD Initiated Contact with Patient 01/11/14 1127     Chief Complaint  Patient presents with  . Back Pain  . Dysuria     (Consider location/radiation/quality/duration/timing/severity/associated sxs/prior Treatment) HPI Comments: Grace [name] is a 28 y.o. female with a PMHx of sickle cell trait, hyperthyroidism, HTN, recurrent UTIs, and chronic joint pain, with a PSHx of cholecystectomy, D&C for molar pregnancy, and b/l salpingectomy, who presents to the ED with complaints of increased urinary frequency, cloudy urine, and midlow back pain that began suddenly last night. Pain is located bilaterally in the mid and lower back, rated at 10/10 throbbing, constant, nonradiating, worse with movement and palpation, and improved with lying down. She states that NSAIDs provided no relief of symptoms. Associated symptoms include increased urinary frequency with cloudy urine, but no dysuria or hematuria or changes in color or odor of her urine. She endorses chills, palpitations, lightheadedness with standing, and nausea without emesis. Denies any chest pain, shortness of breath, dyspnea, cough, sore throat, URI symptoms, abdominal pain, vomiting, changes in bowel habits, constipation, diarrhea, hematochezia, hematuria, dysuria, vaginal bleeding or discharge, dyspareunia, syncope, paresthesias, incontinence of urine or stool, or perianal numbness. No recent trauma or heavy lifting. Endorses being sexually active with one partner and no condoms used, last sexual encounter was several days ago. Reports being compliant with her meds, particularly Methimazole, but states she's no longer taking labetalol. Has not had any thyroid issues in >32yr, states her last check up was 1 yr ago.   Patient is a 28 y.o. female presenting with back pain and frequency. The history is provided by the patient. No language interpreter was used.  Back Pain Location:   Lumbar spine (mid-low back, bilateral) Quality: throbbing. Radiates to:  Does not radiate Pain severity:  Severe (10/10) Pain is:  Same all the time Onset quality:  Sudden Duration:  12 hours Timing:  Constant Progression:  Unchanged Chronicity:  New Context: not recent illness and not recent injury   Context comment:  Spontaneous without inciting event Relieved by:  Nothing Worsened by:  Bending, palpation and movement Ineffective treatments:  NSAIDs Associated symptoms: no abdominal pain, no abdominal swelling, no bladder incontinence, no bowel incontinence, no chest pain, no dysuria, no fever, no headaches, no leg pain, no numbness, no paresthesias, no pelvic pain, no perianal numbness, no tingling and no weakness   Urinary Frequency This is a new problem. The current episode started yesterday. The problem occurs constantly. The problem has been unchanged. Associated symptoms include chills, nausea and urinary symptoms. Pertinent negatives include no abdominal pain, arthralgias, change in bowel habit, chest pain, congestion, coughing, diaphoresis, fatigue, fever, headaches, myalgias, neck pain, numbness, rash, sore throat, vomiting or weakness. Nothing aggravates the symptoms. She has tried nothing for the symptoms. The treatment provided no relief.    Past Medical History  Diagnosis Date  . Sickle cell trait   . Hyperthyroidism   . Sickle cell trait   . Cholecystitis 08/18/2011  . SVD (spontaneous vaginal delivery)     x 3 [facility]  . Hypertension   . History of recurrent UTIs     recurrent UTIs  . Headache(784.0)     otc med prn  . Joint pain     knees and shoulders - otc med prn   Past Surgical History  Procedure Laterality Date  . Cholecystectomy  08/19/2011    Procedure: LAPAROSCOPIC CHOLECYSTECTOMY WITH  INTRAOPERATIVE CHOLANGIOGRAM;  Surgeon: Lodema Pilot, DO;  Location: [facility] OR;  Service: General;  Laterality: N/A;  . Dilation and curettage of uterus      molar preg  .  Laparoscopic bilateral salpingectomy Bilateral 05/09/2013    Procedure: LAPAROSCOPIC BILATERAL SALPINGECTOMY;  Surgeon: Allie Bossier, MD;  Location: [facility];  Service: Gynecology;  Laterality: Bilateral;   Family History  Problem Relation Age of Onset  . Diabetes Mother   . Cancer Father   . Diabetes Maternal Uncle   . Cancer Maternal Grandmother   . Cancer Maternal Grandfather   . Hypertension Maternal Grandfather   . Diabetes Maternal Grandfather   . Sickle cell anemia Son   . Asthma Son   . Heart disease Paternal Grandfather   . Hearing loss Paternal Aunt    History  Substance Use Topics  . Smoking status: Never Smoker   . Smokeless tobacco: Never Used  . Alcohol Use: No   OB History   Grav Para Term Preterm Abortions TAB SAB Ect Mult Living   Review of Systems  Constitutional: Positive for chills and appetite change (decreased). Negative for fever, diaphoresis and fatigue.  HENT: Negative for congestion, rhinorrhea and sore throat.   Eyes: Negative for visual disturbance.  Respiratory: Negative for cough and shortness of breath.   Cardiovascular: Positive for palpitations. Negative for chest pain and leg swelling.  Gastrointestinal: Positive for nausea. Negative for vomiting, abdominal pain, diarrhea, constipation, blood in stool, rectal pain, change in bowel habit and bowel incontinence.  Genitourinary: Positive for urgency, frequency and flank pain. Negative for bladder incontinence, dysuria, hematuria, decreased urine volume, vaginal bleeding, vaginal discharge, difficulty urinating, vaginal pain, menstrual problem, pelvic pain and dyspareunia.  Musculoskeletal: Positive for back pain. Negative for arthralgias, gait problem, myalgias and neck pain.  Skin: Negative for color change and rash.  Neurological: Positive for light-headedness (with standing). Negative for dizziness, tingling, tremors, syncope, weakness, numbness, headaches and paresthesias.    Psychiatric/Behavioral: Negative for confusion.   10 Systems reviewed and are negative for acute change except as noted in the HPI.    Allergies  Review of patient's allergies indicates no known allergies.  Home Medications   Prior to Admission medications   Medication Sig Start Date End Date Taking? Authorizing Provider  cyclobenzaprine (FLEXERIL) 10 MG tablet Take 1 tablet (10 mg total) by mouth 2 (two) times daily as needed for muscle spasms. 12/07/13   Arthor Captain, PA-C  HYDROcodone-acetaminophen (NORCO) 5-325 MG per tablet Take 1-2 tablets by mouth every 6 (six) hours as needed for moderate pain. 12/07/13   Arthor Captain, PA-C  ibuprofen (ADVIL,MOTRIN) 200 MG tablet Take 400 mg by mouth every 6 (six) hours as needed for moderate pain.    Historical Provider, MD  labetalol (NORMODYNE) 100 MG tablet Take 0.5 tablets (50 mg total) by mouth 2 (two) times daily. 10/14/12   Currie Paris, NP  methimazole (TAPAZOLE) 10 MG tablet Take 2 tablets (20 mg total) by mouth 2 (two) times daily. 09/30/12   Ozella Rocks, MD  naproxen (NAPROSYN) 500 MG tablet Take 1 tablet (500 mg total) by mouth 2 (two) times daily with a meal. 12/07/13   Arthor Captain, PA-C   BP 132/80  Pulse 130  Temp(Src) 100.2 F (37.9 C) (Oral)  Resp 18  SpO2 100%  LMP 12/27/2013 Physical Exam  Nursing note and vitals reviewed. Constitutional: She is oriented to  person, place, and time. She appears well-developed and well-nourished.  Non-toxic appearance. No distress.  Febrile at 100.2, tachycardic at 130, although does not appear distressed or toxic  HENT:  Head: Normocephalic and atraumatic.  Mouth/Throat: Oropharynx is clear and moist. Mucous membranes are dry (mildly dry lips).  Oropharynx clear without exudates or erythema Lips mildly dry but otherwise moist oral mucosa  Eyes: Conjunctivae and EOM are normal. Pupils are equal, round, and reactive to light. Right eye exhibits no discharge. Left eye exhibits  no discharge.  Neck: Normal range of motion. Neck supple. No JVD present. No spinous process tenderness and no muscular tenderness present. No rigidity. Normal range of motion present. No thyromegaly present.  No thyromegaly or tenderness  Cardiovascular: Regular rhythm, normal heart sounds and intact distal pulses.  Tachycardia present.  Exam reveals no gallop and no friction rub.   No murmur heard. Tachycardic in the 120s-130s, worse with sitting up during exam, regular rhythm, nl s1/s2, no M/R/G, intact distal pulses  Pulmonary/Chest: Effort normal and breath sounds normal. No respiratory distress. She has no decreased breath sounds. She has no wheezes. She has no rhonchi. She has no rales.  Abdominal: Soft. Normal appearance and bowel sounds are normal. She exhibits no distension. There is tenderness in the suprapubic area. There is CVA tenderness (mild, bilateral). There is no rigidity, no rebound, no guarding and no tenderness at McBurney's point.    Soft, nondistended, +BS throughout, with mild suprapubic TTP, no r/g/r, neg mcburney's point TTP, mild CVA TTP bilaterally  Musculoskeletal: Normal range of motion.       Lumbar back: She exhibits tenderness. She exhibits no swelling and no spasm.       Back:  FROM intact in all spinal levels, no midline bony TTP in all spinal levels, mild TTP over paraspinous muscles of thoracic/lumbar spine from CVA down to iliac crest, no spasm noted. No warmth or erythema to overlying tissues. Strength 5/5 in all extremities, sensation grossly intact in all extremities, distal pulses intact, gait WNL  Neurological: She is alert and oriented to person, place, and time. She has normal strength. No sensory deficit. Gait normal.  Skin: Skin is warm, dry and intact. No rash noted. No erythema.  No erythema or warmth over back or hips No rashes Skin warm and dry No pedal edema  Psychiatric: She has a normal mood and affect.    ED Course  Procedures  (including critical care time) Labs Review Labs Reviewed  COMPREHENSIVE METABOLIC PANEL - Abnormal; Notable for the following:    Anion gap 16 (*)    All other components within normal limits  URINALYSIS, ROUTINE W REFLEX MICROSCOPIC - Abnormal; Notable for the following:    APPearance HAZY (*)    Leukocytes, UA SMALL (*)    All other components within normal limits  CBC WITH DIFFERENTIAL - Abnormal; Notable for the following:    WBC 11.7 (*)    Hemoglobin 10.9 (*)    HCT 32.8 (*)    MCV 77.2 (*)    MCH 25.6 (*)    Platelets 413 (*)    Neutro Abs 9.0 (*)    Monocytes Absolute 1.1 (*)    All other components within normal limits  TSH - Abnormal; Notable for the following:    TSH 0.247 (*)    All other components within normal limits  URINE MICROSCOPIC-ADD ON - Abnormal; Notable for the following:    Squamous Epithelial / LPF MANY (*)  Bacteria, UA MANY (*)    All other components within normal limits  URINE CULTURE  LIPASE, BLOOD  T4, FREE  POC URINE PREG, ED    Imaging Review No results found.   EKG Interpretation None      MDM   Final diagnoses:  UTI (lower urinary tract infection)  Flank pain  Hyperthyroidism  Fever, unspecified fever cause  Sinus tachycardia  Anemia, unspecified anemia type  Nausea    27y/o female with urinary freq and b/l flank pain, febrile and tachycardic. DDx includes thyroid storm although doubtful given lack of other exam findings and compliance on medications, or pyelonephritis. Will obtain labs and hold on imaging until these result, possibly obtain CT or U/S of kidneys. Doubt nephrolithiasis or PID/pelvic etiology given lack of symptoms. Will give fluids, pain control, and see if VS stabilize. Will reassess shortly.  1:22 PM CMP WNL with preserved kidney function, CBC w/diff showing WBC 11.7, along with baseline anemia, mildly elevated platelets, likely acute phase reactant. Lipase WNL. Upreg neg, U/A with small leuks, 21-50WBC,  many bacteria, although also has many squamous cells. Given symptoms, and concern for early pyelo, will treat, and send for UCx. TSH 0.247 which is marginally low but doubtful that thyroid storm is the issue today. Temp improved after tylenol, tachycardia resolving with fluids and pain control. Pain still 7/10, will give toradol and one more bolus fluids prior to d/c. Last UCx grew E.coli sensitive to Cipro in May 2015, plan to d/c with this. Will reassess after toradol and plan for discharge.  2:14 PM Pt feeling improved with pain, appetite returning. VS improved, stable and nontoxic, tolerating PO while in ED. Doubt need for admission at this time, no longer meeting SIRS criteria. Will give cipro, zofran, norco, and naprosyn prescriptions. Discussed f/up with PCP for ongoing management of recurrent UTIs, chronic anemia, hyperthyroidism, and for recheck of symptoms in 1 week. I explained the diagnosis and have given explicit precautions to return to the ER including for any other new or worsening symptoms. The patient understands and accepts the medical plan as it's been dictated and I have answered their questions. Discharge instructions concerning home care and prescriptions have been given. The patient is STABLE and is discharged to home in good condition.   BP 109/69  Pulse 99  Temp(Src) 99 F (37.2 C) (Oral)  Resp 18  SpO2 99%  LMP 12/27/2013  Meds ordered this encounter  Medications  . acetaminophen (TYLENOL) tablet 650 mg    Sig:   . sodium chloride 0.9 % bolus 1,000 mL    Sig:   . morphine 4 MG/ML injection 4 mg    Sig:   . ondansetron (ZOFRAN) injection 4 mg    Sig:   . ketorolac (TORADOL) 30 MG/ML injection 30 mg    Sig:   . sodium chloride 0.9 % bolus 1,000 mL    Sig:   . HYDROcodone-acetaminophen (NORCO) 5-325 MG per tablet    Sig: Take 1-2 tablets by mouth every 6 (six) hours as needed for severe pain.    Dispense:  10 tablet    Refill:  0    Order Specific Question:   Supervising Provider    Answer:  Eber Hong D [[id number]]  . naproxen (NAPROSYN) 500 MG tablet    Sig: Take 1 tablet (500 mg total) by mouth 2 (two) times daily as needed for mild pain, moderate pain or headache (TAKE WITH MEALS.).    Dispense:  20 tablet  Refill:  0    Order Specific Question:  Supervising Provider    Answer:  Eber Hong D [[id number]]  . ciprofloxacin (CIPRO) 500 MG tablet    Sig: Take 1 tablet (500 mg total) by mouth 2 (two) times daily. One po bid x 10 days    Dispense:  20 tablet    Refill:  0    Order Specific Question:  Supervising Provider    Answer:  Eber Hong D [[id number]]  . ondansetron (ZOFRAN) 8 MG tablet    Sig: Take 1 tablet (8 mg total) by mouth every 8 (eight) hours as needed for nausea or vomiting.    Dispense:  10 tablet    Refill:  0    Order Specific Question:  Supervising Provider    Answer:  Vida Roller [address] [place], PA-C 01/11/14 1415

## 2014-01-13 LAB — URINE CULTURE: Colony Count: >=100000

## 2014-01-13 NOTE — ED Provider Notes (Signed)
Medical screening examination/treatment/procedure(s) were performed by non-physician practitioner and as supervising physician I was immediately available for consultation/collaboration.   EKG Interpretation None        Rolland Porter, MD 01/13/14 1537

## 2014-01-14 ENCOUNTER — Telehealth (HOSPITAL_BASED_OUTPATIENT_CLINIC_OR_DEPARTMENT_OTHER): Payer: Self-pay | Admitting: Emergency Medicine

## 2014-01-14 NOTE — Telephone Encounter (Signed)
Post ED Visit - Positive Culture Follow-up  Culture report reviewed by antimicrobial stewardship pharmacist:  Wes Dulaney, Pharm.D., BCPS  Celedonio Miyamoto, Pharm.D., BCPS  Georgina Pillion, Pharm.D., BCPS  [place], [place].D., BCPS, AAHIVP  Estella Husk, Pharm.D., BCPS, AAHIVP  Carly Sabat, Pharm.D.  Enzo Bi, [address].D.  Positive urine culture >100,000 colonies/ml E. Coli Treated with Ciprofloxacin  tabs bid x 10 days, organism sensitive to the same and no further patient follow-up is required at this time.  Berle Mull 01/14/2014, 6:13 PM

## 2014-02-18 ENCOUNTER — Encounter (HOSPITAL_COMMUNITY): Payer: Self-pay | Admitting: Emergency Medicine

## 2014-03-26 ENCOUNTER — Emergency Department (HOSPITAL_COMMUNITY)
Admission: EM | Admit: 2014-03-26 | Discharge: 2014-03-27 | Disposition: A | Discharge status: Home / Self Care | Payer: Self-pay | Attending: Emergency Medicine | Admitting: Emergency Medicine

## 2014-03-26 ENCOUNTER — Encounter (HOSPITAL_COMMUNITY): Payer: Self-pay | Admitting: Emergency Medicine

## 2014-03-26 ENCOUNTER — Emergency Department (HOSPITAL_COMMUNITY): Payer: Medicaid Other

## 2014-03-26 ENCOUNTER — Emergency Department (HOSPITAL_COMMUNITY): Payer: Self-pay

## 2014-03-26 DIAGNOSIS — Z79899 Other long term (current) drug therapy: Secondary | ICD-10-CM | POA: Insufficient documentation

## 2014-03-26 DIAGNOSIS — N390 Urinary tract infection, site not specified: Secondary | ICD-10-CM | POA: Insufficient documentation

## 2014-03-26 DIAGNOSIS — Z862 Personal history of diseases of the blood and blood-forming organs and certain disorders involving the immune mechanism: Secondary | ICD-10-CM | POA: Insufficient documentation

## 2014-03-26 DIAGNOSIS — R0602 Shortness of breath: Secondary | ICD-10-CM

## 2014-03-26 DIAGNOSIS — R000 Tachycardia, unspecified: Secondary | ICD-10-CM | POA: Insufficient documentation

## 2014-03-26 DIAGNOSIS — Z9049 Acquired absence of other specified parts of digestive tract: Secondary | ICD-10-CM | POA: Insufficient documentation

## 2014-03-26 DIAGNOSIS — Z8739 Personal history of other diseases of the musculoskeletal system and connective tissue: Secondary | ICD-10-CM | POA: Insufficient documentation

## 2014-03-26 DIAGNOSIS — Z8719 Personal history of other diseases of the digestive system: Secondary | ICD-10-CM | POA: Insufficient documentation

## 2014-03-26 DIAGNOSIS — Z90710 Acquired absence of both cervix and uterus: Secondary | ICD-10-CM | POA: Insufficient documentation

## 2014-03-26 DIAGNOSIS — R059 Cough, unspecified: Secondary | ICD-10-CM

## 2014-03-26 DIAGNOSIS — E0590 Thyrotoxicosis, unspecified without thyrotoxic crisis or storm: Secondary | ICD-10-CM | POA: Insufficient documentation

## 2014-03-26 DIAGNOSIS — R05 Cough: Secondary | ICD-10-CM | POA: Insufficient documentation

## 2014-03-26 DIAGNOSIS — I10 Essential (primary) hypertension: Secondary | ICD-10-CM | POA: Insufficient documentation

## 2014-03-26 LAB — CBC WITH DIFFERENTIAL/PLATELET
BASOS ABS: 0 10*3/uL (ref 0.0–0.1)
BASOS PCT: 0 % (ref 0–1)
Eosinophils Absolute: 0 10*3/uL (ref 0.0–0.7)
Eosinophils Relative: 0 % (ref 0–5)
HCT: 32.8 % — ABNORMAL LOW (ref 36.0–46.0)
Hemoglobin: 11.1 g/dL — ABNORMAL LOW (ref 12.0–15.0)
Lymphocytes Relative: 16 % (ref 12–46)
Lymphs Abs: 1.7 10*3/uL (ref 0.7–4.0)
MCH: 26.3 pg (ref 26.0–34.0)
MCHC: 33.8 g/dL (ref 30.0–36.0)
MCV: 77.7 fL — ABNORMAL LOW (ref 78.0–100.0)
Monocytes Absolute: 1.2 10*3/uL — ABNORMAL HIGH (ref 0.1–1.0)
Monocytes Relative: 12 % (ref 3–12)
NEUTROS PCT: 72 % (ref 43–77)
Neutro Abs: 7.6 10*3/uL (ref 1.7–7.7)
PLATELETS: 353 10*3/uL (ref 150–400)
RBC: 4.22 MIL/uL (ref 3.87–5.11)
RDW: 14.9 % (ref 11.5–15.5)
WBC: 10.6 10*3/uL — ABNORMAL HIGH (ref 4.0–10.5)

## 2014-03-26 LAB — URINE MICROSCOPIC-ADD ON

## 2014-03-26 LAB — I-STAT TROPONIN, ED: Troponin i, poc: 0 ng/mL (ref 0.00–0.08)

## 2014-03-26 LAB — COMPREHENSIVE METABOLIC PANEL
ALBUMIN: 3.9 g/dL (ref 3.5–5.2)
ALK PHOS: 56 U/L (ref 39–117)
ALT: 19 U/L (ref 0–35)
AST: 27 U/L (ref 0–37)
Anion gap: 17 — ABNORMAL HIGH (ref 5–15)
BILIRUBIN TOTAL: 0.5 mg/dL (ref 0.3–1.2)
BUN: 6 mg/dL (ref 6–23)
CO2: 24 mEq/L (ref 19–32)
Calcium: 9.4 mg/dL (ref 8.4–10.5)
Chloride: 97 mEq/L (ref 96–112)
Creatinine, Ser: 0.72 mg/dL (ref 0.50–1.10)
GFR calc Af Amer: >90 mL/min (ref >90)
GFR calc non Af Amer: >90 mL/min (ref >90)
Glucose, Bld: 90 mg/dL (ref 70–99)
POTASSIUM: 3.3 meq/L — AB (ref 3.7–5.3)
Sodium: 138 mEq/L (ref 137–147)
TOTAL PROTEIN: 8.6 g/dL — AB (ref 6.0–8.3)

## 2014-03-26 LAB — URINALYSIS, ROUTINE W REFLEX MICROSCOPIC
Bilirubin Urine: NEGATIVE
GLUCOSE, UA: NEGATIVE mg/dL
KETONES UR: 15 mg/dL — AB
Nitrite: POSITIVE — AB
PH: 5.5 (ref 5.0–8.0)
Protein, ur: 100 mg/dL — AB
SPECIFIC GRAVITY, URINE: 1.015 (ref 1.005–1.030)
Urobilinogen, UA: 0.2 mg/dL (ref 0.0–1.0)

## 2014-03-26 LAB — D-DIMER, QUANTITATIVE: D-Dimer, Quant: 0.99 ug/mL-FEU — ABNORMAL HIGH (ref 0.00–0.48)

## 2014-03-26 LAB — LIPASE, BLOOD: Lipase: 27 U/L (ref 11–59)

## 2014-03-26 MED ORDER — ONDANSETRON 4 MG PO TBDP
4.0000 mg | ORAL_TABLET | Freq: Once | ORAL | Status: AC
  Administered 2014-03-26: 4 mg via ORAL
  Filled 2014-03-26: qty 1

## 2014-03-26 MED ORDER — DEXTROSE 5 % IV SOLN
1.0000 g | Freq: Once | INTRAVENOUS | Status: AC
  Administered 2014-03-26: 1 g via INTRAVENOUS
  Filled 2014-03-26: qty 10

## 2014-03-26 MED ORDER — ONDANSETRON HCL 4 MG/2ML IJ SOLN
4.0000 mg | Freq: Once | INTRAMUSCULAR | Status: AC
  Administered 2014-03-26: 4 mg via INTRAVENOUS
  Filled 2014-03-26: qty 2

## 2014-03-26 MED ORDER — SODIUM CHLORIDE 0.9 % IV BOLUS (SEPSIS)
1000.0000 mL | Freq: Once | INTRAVENOUS | Status: AC
Start: 2014-03-26 — End: 2014-03-27
  Administered 2014-03-26: 1000 mL via INTRAVENOUS

## 2014-03-26 MED ORDER — CEFTRIAXONE SODIUM 1 G IJ SOLR: 1.0000 g | Freq: Once | INTRAMUSCULAR | Status: DC

## 2014-03-26 MED ORDER — ACETAMINOPHEN 325 MG PO TABS
650.0000 mg | ORAL_TABLET | Freq: Once | ORAL | Status: AC
  Administered 2014-03-26: 650 mg via ORAL
  Filled 2014-03-26: qty 2

## 2014-03-26 MED ORDER — IOHEXOL 350 MG/ML SOLN
100.0000 mL | Freq: Once | INTRAVENOUS | Status: AC | PRN
  Administered 2014-03-26: 100 mL via INTRAVENOUS

## 2014-03-26 MED ORDER — CEFTRIAXONE SODIUM 250 MG IJ SOLR: 250.0000 mg | Freq: Once | INTRAMUSCULAR | Status: DC

## 2014-03-26 MED ORDER — SODIUM CHLORIDE 0.9 % IV BOLUS (SEPSIS)
1000.0000 mL | Freq: Once | INTRAVENOUS | Status: AC
  Administered 2014-03-26: 1000 mL via INTRAVENOUS

## 2014-03-26 NOTE — ED Notes (Signed)
Patient allowed nurse to know at this time that she was experiencing chest pain. Patient stated a 5/10 chest pressure that started yesterday.

## 2014-03-26 NOTE — ED Notes (Signed)
Pt sts URI sx with fever and cough and N/V x 3 days

## 2014-03-26 NOTE — ED Notes (Signed)
PA at the bedside.

## 2014-03-26 NOTE — ED Provider Notes (Signed)
CSN: [id number]     Arrival date & time 03/26/14  1712 History   First MD Initiated Contact with Patient 03/26/14 2020     Chief Complaint  Patient presents with  . URI  . Emesis     (Consider location/radiation/quality/duration/timing/severity/associated sxs/prior Treatment) HPI Grace [name] is a 28 y.o. female  with a history of sickle cell trait, hyperthyroidism comes in for evaluation of cough, nausea and vomiting, muscle aches. Patient states yesterday around lunch time she started to feel nauseous and could not keep anything down. She reports a posttussive emesis rather than an overt vomiting. She reports decreased appetite. She reports abdominal discomfort that feels like a squeezing pressure that wraps around to her back "in my kidneys". She is unsure of fevers at home. She denies any recent travel, surgeries, unilateral leg swelling, exogenous estrogen. She did not have a flu shot this year. No other modifying factors. Past Medical History  Diagnosis Date  . Sickle cell trait   . Hyperthyroidism   . Sickle cell trait   . Cholecystitis 08/18/2011  . SVD (spontaneous vaginal delivery)     x 3 WH  . Hypertension   . History of recurrent UTIs     recurrent UTIs  . Headache(784.0)     otc med prn  . Joint pain     knees and shoulders - otc med prn   Past Surgical History  Procedure Laterality Date  . Cholecystectomy  08/19/2011    Procedure: LAPAROSCOPIC CHOLECYSTECTOMY WITH INTRAOPERATIVE CHOLANGIOGRAM;  Surgeon: Lodema Pilot, DO;  Location: [facility] OR;  Service: General;  Laterality: N/A;  . Dilation and curettage of uterus      molar preg  . Laparoscopic bilateral salpingectomy Bilateral 05/09/2013    Procedure: LAPAROSCOPIC BILATERAL SALPINGECTOMY;  Surgeon: Allie Bossier, MD;  Location: [facility];  Service: Gynecology;  Laterality: Bilateral;   Family History  Problem Relation Age of Onset  . Diabetes Mother   . Cancer Father   . Diabetes Maternal Uncle   . Cancer Maternal  Grandmother   . Cancer Maternal Grandfather   . Hypertension Maternal Grandfather   . Diabetes Maternal Grandfather   . Sickle cell anemia Son   . Asthma Son   . Heart disease Paternal Grandfather   . Hearing loss Paternal Aunt    History  Substance Use Topics  . Smoking status: Never Smoker   . Smokeless tobacco: Never Used  . Alcohol Use: No   OB History    Gravida Para Term Preterm AB TAB SAB Ectopic Multiple Living   4 3 3  1  1   3      Review of Systems  Constitutional: Positive for fever.  HENT: Negative for sore throat.   Eyes: Negative for visual disturbance.  Respiratory: Positive for cough. Negative for shortness of breath.   Cardiovascular: Negative for chest pain.  Gastrointestinal: Positive for nausea, vomiting and abdominal pain.  Endocrine: Negative for polyuria.  Genitourinary: Negative for dysuria.  Musculoskeletal: Positive for myalgias.  Skin: Negative for rash.  Neurological: Negative for headaches.      Allergies  Review of patient's allergies indicates no known allergies.  Home Medications   Prior to Admission medications   Medication Sig Start Date End Date Taking? Authorizing Provider  labetalol (NORMODYNE) 100 MG tablet Take 0.5 tablets (50 mg total) by mouth 2 (two) times daily. 10/14/12  Yes Currie Paris, NP  methimazole (TAPAZOLE) 10 MG tablet Take 2 tablets (20 mg total) by  mouth 2 (two) times daily. 09/30/12  Yes Ozella Rocksavid J Merrell, MD  cephALEXin (KEFLEX) 500 MG capsule Take 1 capsule (500 mg total) by mouth 4 (four) times daily. 03/27/14   Sharlene MottsBenjamin W Mahdiya Mossberg, PA-C  chlorpheniramine-HYDROcodone (TUSSIONEX PENNKINETIC ER) 10-8 MG/5ML LQCR Take 5 mLs by mouth every 12 (twelve) hours as needed for cough (Cough). 03/27/14   Earle GellBenjamin W Elaynah Virginia, PA-C  ciprofloxacin (CIPRO) 500 MG tablet Take 1 tablet (500 mg total) by mouth 2 (two) times daily. One po bid x 10 days Patient not taking: Reported on 03/26/2014 01/11/14   Donnita FallsMercedes Strupp Camprubi-Soms,  PA-C  cyclobenzaprine (FLEXERIL) 10 MG tablet Take 1 tablet (10 mg total) by mouth 2 (two) times daily as needed for muscle spasms. Patient not taking: Reported on 03/26/2014 12/07/13   Arthor CaptainAbigail Harris, PA-C  HYDROcodone-acetaminophen (NORCO) 5-325 MG per tablet Take 1-2 tablets by mouth every 6 (six) hours as needed for severe pain. Patient not taking: Reported on 03/26/2014 01/11/14   Donnita FallsMercedes Strupp Camprubi-Soms, PA-C  ibuprofen (ADVIL,MOTRIN) 200 MG tablet Take 400 mg by mouth every 6 (six) hours as needed for moderate pain.    Historical Provider, MD  naproxen (NAPROSYN) 500 MG tablet Take 1 tablet (500 mg total) by mouth 2 (two) times daily as needed for mild pain, moderate pain or headache (TAKE WITH MEALS.). 03/27/14   Earle GellBenjamin W Lacora Folmer, PA-C  ondansetron (ZOFRAN) 8 MG tablet Take 1 tablet (8 mg total) by mouth every 8 (eight) hours as needed for nausea or vomiting. 03/27/14   Sharlene MottsBenjamin W Diago Haik, PA-C  phenazopyridine (PYRIDIUM) 200 MG tablet Take 1 tablet (200 mg total) by mouth 3 (three) times daily. 03/27/14   Earle GellBenjamin W Ariabella Brien, PA-C   BP 113/68 mmHg  Pulse 107  Temp(Src) 101.3 F (38.5 C) (Oral)  Resp 18  SpO2 100% Physical Exam  Constitutional: She is oriented to person, place, and time. She appears well-developed and well-nourished.  HENT:  Head: Normocephalic and atraumatic.  Mouth/Throat: Oropharynx is clear and moist.  Eyes: Conjunctivae are normal. Pupils are equal, round, and reactive to light. Right eye exhibits no discharge. Left eye exhibits no discharge. No scleral icterus.  Neck: Neck supple. No JVD present. No tracheal deviation present.  Cardiovascular: Normal rate and normal heart sounds.   Patient tachycardic on exam. No murmurs rubs or gallops appreciated  Pulmonary/Chest: Effort normal and breath sounds normal. No respiratory distress. She has no wheezes. She has no rales.  Abdominal: Soft. She exhibits no distension and no mass. There is no tenderness. There is no  rebound and no guarding.  No CVA tenderness  Musculoskeletal: Normal range of motion. She exhibits no edema or tenderness.  Lymphadenopathy:    She has no cervical adenopathy.  Neurological: She is alert and oriented to person, place, and time.  Cranial Nerves II-XII grossly intact  Skin: Skin is warm and dry. No rash noted.  Psychiatric: She has a normal mood and affect.  Nursing note and vitals reviewed.   ED Course  Procedures (including critical care time) Labs Review Labs Reviewed  CBC WITH DIFFERENTIAL - Abnormal; Notable for the following:    WBC 10.6 (*)    Hemoglobin 11.1 (*)    HCT 32.8 (*)    MCV 77.7 (*)    Monocytes Absolute 1.2 (*)    All other components within normal limits  COMPREHENSIVE METABOLIC PANEL - Abnormal; Notable for the following:    Potassium 3.3 (*)    Total Protein 8.6 (*)  Anion gap 17 (*)    All other components within normal limits  URINALYSIS, ROUTINE W REFLEX MICROSCOPIC - Abnormal; Notable for the following:    APPearance CLOUDY (*)    Hgb urine dipstick SMALL (*)    Ketones, ur 15 (*)    Protein, ur 100 (*)    Nitrite POSITIVE (*)    Leukocytes, UA LARGE (*)    All other components within normal limits  URINE MICROSCOPIC-ADD ON - Abnormal; Notable for the following:    Squamous Epithelial / LPF FEW (*)    Bacteria, UA MANY (*)    All other components within normal limits  D-DIMER, QUANTITATIVE - Abnormal; Notable for the following:    D-Dimer, Quant 0.99 (*)    All other components within normal limits  LIPASE, BLOOD  I-STAT TROPOININ, ED    Imaging Review Dg Chest 2 View (if Patient Has Fever And/or Copd)  03/26/2014   CLINICAL DATA:  Diarrhea, vomiting, cough, and fever for 2 days, history sickle cell trait, hypertension  EXAM: CHEST  2 VIEW  COMPARISON:  12/31/2004; correlation interval CT chest of 04/03/2011  FINDINGS: Normal heart size, mediastinal contours, and pulmonary vascularity.  Elevation of LEFT diaphragm versus  previous exam.  Peribronchial thickening without infiltrate or pleural effusion.  No pneumothorax.  Gaseous distention of stomach and splenic flexure of colon.  No acute osseous findings.  IMPRESSION: Bronchitic changes.  Elevation of LEFT diaphragm with gaseous distention of stomach and splenic flexure of colon.   Electronically Signed   By: Ulyses SouthwardMark  Boles M.D.   On: 03/26/2014 18:47   Ct Angio Chest Pe W/cm &/or Wo Cm  03/27/2014   CLINICAL DATA:  Upper respiratory tract symptoms with fever, cough, and nausea and vomiting for 3 days. Shortness of breath.  EXAM: CT ANGIOGRAPHY CHEST WITH CONTRAST  TECHNIQUE: Multidetector CT imaging of the chest was performed using the standard protocol during bolus administration of intravenous contrast. Multiplanar CT image reconstructions and MIPs were obtained to evaluate the vascular anatomy.  CONTRAST:  100mL OMNIPAQUE IOHEXOL 350 MG/ML SOLN  COMPARISON:  04/03/2011  FINDINGS: Technically adequate study with good opacification of the central and segmental pulmonary arteries. No focal filling defects demonstrated. No evidence of significant pulmonary embolus.  Normal heart size. Normal caliber thoracic aorta. No evidence of aortic dissection. Great vessel origins are patent. Mild prominence of axillary lymph nodes. Likely reactive. No significant mediastinal lymphadenopathy. Esophagus is intermittently dilated. This could indicate dysmotility.  Lungs appear clear and expanded. No focal airspace disease or consolidation. No pneumothorax. No pleural effusions.  Included portions of the upper abdominal organs are grossly unremarkable. No destructive bone lesions.  Review of the MIP images confirms the above findings.  IMPRESSION: No evidence of significant pulmonary embolus. No evidence of active pulmonary disease.   Electronically Signed   By: Burman NievesWilliam  Stevens M.D.   On: 03/27/2014 00:16     EKG Interpretation None     Meds given in ED:  Medications  ondansetron  (ZOFRAN-ODT) disintegrating tablet 4 mg (4 mg Oral Given 03/26/14 1959)  ondansetron (ZOFRAN) injection 4 mg (4 mg Intravenous Given 03/26/14 2134)  acetaminophen (TYLENOL) tablet 650 mg (650 mg Oral Given 03/26/14 2134)  sodium chloride 0.9 % bolus 1,000 mL (0 mLs Intravenous Stopped 03/26/14 2328)  sodium chloride 0.9 % bolus 1,000 mL (0 mLs Intravenous Stopped 03/27/14 0042)  cefTRIAXone (ROCEPHIN) 1 g in dextrose 5 % 50 mL IVPB (0 g Intravenous Stopped 03/27/14 0031)  iohexol (OMNIPAQUE)  350 MG/ML injection 100 mL (100 mLs Intravenous Contrast Given 03/26/14 2352)    Discharge Medication List as of 03/27/2014 12:57 AM     Filed Vitals:   03/26/14 2145 03/26/14 2200 03/26/14 2245 03/27/14 0007  BP: 113/69 109/56 106/61 113/68  Pulse: 112 113 108 107  Temp:      TempSrc:      Resp:   16 18  SpO2: 99% 100% 100% 100%   PT reports chest pain while at rest in ED. This in addition to cough and tachycardia, will obtain d-dimer to r/o PE. Dimer positive, will obtain PE study. MDM  Grace [name] is a 28 y.o. female with a history of hyperthyroidism, sickle cell trait comes in for evaluation of cough, abdominal pain, nausea and vomiting. Patient found to have UTI as well as evidence of bronchitis.  Vitals stable  Temp 101.3, tachycardia reducing in ED. Pt resting comfortably in ED. feels better since today in ED. PE-not concerning further acute or emergent pathology. No evidence of acute or surgical abdomen. Labwork-urinalysis consistent with UTI. Will treat empirically with Rocephin in ED and outpatient antibiotics. Otherwise noncontributory, troponin negative, EKG not concerning Imaging-chest x-ray shows bronchitic changes, no other acute cardiopulmonary pathologies. CT angio shows no evidence of PE  Will DC with antibiotics, Zofran, cough and pain medicine. Discussed f/u with PCP and return precautions, pt very amenable to plan. Patient stable, in good condition and is appropriate for  discharge. Patient ambulates independently out of the ED without any difficulty  Prior to patient discharge, I discussed and reviewed this case with Dr. Jodi Mourning, who also saw and evaluated the pt.  Final diagnoses:  UTI (lower urinary tract infection)  Cough        Earle Gell Sayre, PA-C 03/27/14 1322  Enid Skeens, MD 04/01/14 1515

## 2014-03-27 MED ORDER — NAPROXEN 500 MG PO TABS: 500.0000 mg | ORAL_TABLET | Freq: Two times a day (BID) | ORAL | Status: DC | PRN

## 2014-03-27 MED ORDER — HYDROCOD POLST-CHLORPHEN POLST 10-8 MG/5ML PO LQCR: 5.0000 mL | Freq: Two times a day (BID) | ORAL | Status: DC | PRN

## 2014-03-27 MED ORDER — ONDANSETRON HCL 8 MG PO TABS
8.0000 mg | ORAL_TABLET | Freq: Three times a day (TID) | ORAL | Status: DC | PRN
Start: 2014-03-27 — End: 2014-06-11

## 2014-03-27 MED ORDER — PHENAZOPYRIDINE HCL 200 MG PO TABS: 200.0000 mg | ORAL_TABLET | Freq: Three times a day (TID) | ORAL | Status: DC

## 2014-03-27 MED ORDER — CEPHALEXIN 500 MG PO CAPS: 500.0000 mg | ORAL_CAPSULE | Freq: Four times a day (QID) | ORAL | Status: DC

## 2014-03-27 MED ORDER — CEPHALEXIN 500 MG PO CAPS
500.0000 mg | ORAL_CAPSULE | Freq: Four times a day (QID) | ORAL | Status: DC
Start: 2014-03-27 — End: 2014-06-11

## 2014-03-27 NOTE — Discharge Instructions (Signed)
You were evaluated in the ED today for your cough, abdominal pain, nausea.  Found to have a UTI.  Please take all of your antibiotics as prescribed.  You may take the Pyridium for any discomfort you experience with urination.  Please take the Tussionex as prescribed for your cough and associated chest discomfort.  Please follow-up with your primary care for further evaluation and management of your symptoms.  Return to ED for worsening symptoms

## 2014-03-27 NOTE — ED Notes (Signed)
Pt. Left with all belongings and refused wheelchair 

## 2014-06-11 ENCOUNTER — Emergency Department (HOSPITAL_COMMUNITY): Payer: Medicaid Other

## 2014-06-11 ENCOUNTER — Emergency Department (HOSPITAL_COMMUNITY)
Admission: EM | Admit: 2014-06-11 | Discharge: 2014-06-11 | Disposition: A | Discharge status: Home / Self Care | Payer: Medicaid Other | Attending: Emergency Medicine | Admitting: Emergency Medicine

## 2014-06-11 ENCOUNTER — Encounter (HOSPITAL_COMMUNITY): Payer: Self-pay

## 2014-06-11 DIAGNOSIS — Z862 Personal history of diseases of the blood and blood-forming organs and certain disorders involving the immune mechanism: Secondary | ICD-10-CM | POA: Insufficient documentation

## 2014-06-11 DIAGNOSIS — Z113 Encounter for screening for infections with a predominantly sexual mode of transmission: Secondary | ICD-10-CM | POA: Insufficient documentation

## 2014-06-11 DIAGNOSIS — E0590 Thyrotoxicosis, unspecified without thyrotoxic crisis or storm: Secondary | ICD-10-CM | POA: Insufficient documentation

## 2014-06-11 DIAGNOSIS — N390 Urinary tract infection, site not specified: Secondary | ICD-10-CM | POA: Insufficient documentation

## 2014-06-11 DIAGNOSIS — I10 Essential (primary) hypertension: Secondary | ICD-10-CM | POA: Insufficient documentation

## 2014-06-11 DIAGNOSIS — Z79899 Other long term (current) drug therapy: Secondary | ICD-10-CM | POA: Insufficient documentation

## 2014-06-11 DIAGNOSIS — N939 Abnormal uterine and vaginal bleeding, unspecified: Secondary | ICD-10-CM | POA: Insufficient documentation

## 2014-06-11 DIAGNOSIS — Z8719 Personal history of other diseases of the digestive system: Secondary | ICD-10-CM | POA: Insufficient documentation

## 2014-06-11 LAB — CBC WITH DIFFERENTIAL/PLATELET
Basophils Absolute: 0 10*3/uL (ref 0.0–0.1)
Basophils Relative: 1 % (ref 0–1)
Eosinophils Absolute: 0.1 10*3/uL (ref 0.0–0.7)
Eosinophils Relative: 1 % (ref 0–5)
HCT: 35.6 % — ABNORMAL LOW (ref 36.0–46.0)
HEMOGLOBIN: 11.6 g/dL — AB (ref 12.0–15.0)
LYMPHS ABS: 1.7 10*3/uL (ref 0.7–4.0)
LYMPHS PCT: 26 % (ref 12–46)
MCH: 25.7 pg — ABNORMAL LOW (ref 26.0–34.0)
MCHC: 32.6 g/dL (ref 30.0–36.0)
MCV: 78.8 fL (ref 78.0–100.0)
Monocytes Absolute: 0.4 10*3/uL (ref 0.1–1.0)
Monocytes Relative: 7 % (ref 3–12)
NEUTROS ABS: 4.3 10*3/uL (ref 1.7–7.7)
NEUTROS PCT: 65 % (ref 43–77)
Platelets: 396 10*3/uL (ref 150–400)
RBC: 4.52 MIL/uL (ref 3.87–5.11)
RDW: 14.3 % (ref 11.5–15.5)
WBC: 6.6 10*3/uL (ref 4.0–10.5)

## 2014-06-11 LAB — URINE MICROSCOPIC-ADD ON

## 2014-06-11 LAB — I-STAT CHEM 8, ED
BUN: 6 mg/dL (ref 6–23)
CALCIUM ION: 1.15 mmol/L (ref 1.12–1.23)
Chloride: 107 mmol/L (ref 96–112)
Creatinine, Ser: 0.7 mg/dL (ref 0.50–1.10)
GLUCOSE: 97 mg/dL (ref 70–99)
HCT: 38 % (ref 36.0–46.0)
HEMOGLOBIN: 12.9 g/dL (ref 12.0–15.0)
Potassium: 4.2 mmol/L (ref 3.5–5.1)
Sodium: 143 mmol/L (ref 135–145)
TCO2: 21 mmol/L (ref 0–100)

## 2014-06-11 LAB — URINALYSIS, ROUTINE W REFLEX MICROSCOPIC
BILIRUBIN URINE: NEGATIVE
Glucose, UA: NEGATIVE mg/dL
Ketones, ur: NEGATIVE mg/dL
NITRITE: POSITIVE — AB
PROTEIN: NEGATIVE mg/dL
Specific Gravity, Urine: 1.013 (ref 1.005–1.030)
UROBILINOGEN UA: 0.2 mg/dL (ref 0.0–1.0)
pH: 6 (ref 5.0–8.0)

## 2014-06-11 LAB — WET PREP, GENITAL
TRICH WET PREP: NONE SEEN
Yeast Wet Prep HPF POC: NONE SEEN

## 2014-06-11 LAB — I-STAT BETA HCG BLOOD, ED (MC, WL, AP ONLY)

## 2014-06-11 MED ORDER — CEPHALEXIN 500 MG PO CAPS: 500.0000 mg | ORAL_CAPSULE | Freq: Two times a day (BID) | ORAL | Status: DC

## 2014-06-11 NOTE — ED Notes (Signed)
Pt states she is here for pelvic pain and vaginal bleeding since 05/01/14. Started her normal period on 05/01/14 but has since continued to bleed constantly. Is on depo and has her tubes tied.

## 2014-06-11 NOTE — ED Notes (Signed)
Pelvic cart set up at bedside  

## 2014-06-11 NOTE — ED Notes (Signed)
Patient transported to Ultrasound 

## 2014-06-11 NOTE — ED Provider Notes (Signed)
CSN: [id number]     Arrival date & time 06/11/14  2130 History   First MD Initiated Contact with Patient 06/11/14 [id number]     Chief Complaint  Patient presents with  . Vaginal Bleeding     (Consider location/radiation/quality/duration/timing/severity/associated sxs/prior Treatment) HPI Comments: Pt comes in with c/o vaginal bleeding and pelvic pain since 1/13. States that she is going thru 8 tampons a day. She received her last depo shot in December and she has been on it for several years and has never had a problem. Denies vomiting, diarrhea, fever or dysuria. States that she was seen at the health department and they told her to come in it the symptoms continued  The history is provided by the patient. No language interpreter was used.    Past Medical History  Diagnosis Date  . Sickle cell trait   . Hyperthyroidism   . Sickle cell trait   . Cholecystitis 08/18/2011  . SVD (spontaneous vaginal delivery)     x 3 WH  . Hypertension   . History of recurrent UTIs     recurrent UTIs  . Headache(784.0)     otc med prn  . Joint pain     knees and shoulders - otc med prn   Past Surgical History  Procedure Laterality Date  . Cholecystectomy  08/19/2011    Procedure: LAPAROSCOPIC CHOLECYSTECTOMY WITH INTRAOPERATIVE CHOLANGIOGRAM;  Surgeon: Lodema Pilot, DO;  Location: [facility] OR;  Service: General;  Laterality: N/A;  . Dilation and curettage of uterus      molar preg  . Laparoscopic bilateral salpingectomy Bilateral 05/09/2013    Procedure: LAPAROSCOPIC BILATERAL SALPINGECTOMY;  Surgeon: Allie Bossier, MD;  Location: [facility];  Service: Gynecology;  Laterality: Bilateral;   Family History  Problem Relation Age of Onset  . Diabetes Mother   . Cancer Father   . Diabetes Maternal Uncle   . Cancer Maternal Grandmother   . Cancer Maternal Grandfather   . Hypertension Maternal Grandfather   . Diabetes Maternal Grandfather   . Sickle cell anemia Son   . Asthma Son   . Heart disease Paternal  Grandfather   . Hearing loss Paternal Aunt    History  Substance Use Topics  . Smoking status: Never Smoker   . Smokeless tobacco: Never Used  . Alcohol Use: No   OB History    Gravida Para Term Preterm AB TAB SAB Ectopic Multiple Living   Review of Systems  All other systems reviewed and are negative.     Allergies  Review of patient's allergies indicates no known allergies.  Home Medications   Prior to Admission medications   Medication Sig Start Date End Date Taking? Authorizing Provider  ibuprofen (ADVIL,MOTRIN) 200 MG tablet Take 400 mg by mouth every 6 (six) hours as needed for moderate pain.   Yes Historical Provider, MD  labetalol (NORMODYNE) 100 MG tablet Take 0.5 tablets (50 mg total) by mouth 2 (two) times daily. 10/14/12  Yes Currie Paris, NP  methimazole (TAPAZOLE) 10 MG tablet Take 2 tablets (20 mg total) by mouth 2 (two) times daily. 09/30/12  Yes Ozella Rocks, MD  cephALEXin (KEFLEX) 500 MG capsule Take 1 capsule (500 mg total) by mouth 4 (four) times daily. Patient not taking: Reported on 06/11/2014 03/27/14   Earle Gell Cartner, PA-C  chlorpheniramine-HYDROcodone [facility]) 10-8 MG/5ML LQCR Take 5 mLs by mouth every 12 (twelve)  hours as needed for cough (Cough). Patient not taking: Reported on 06/11/2014 03/27/14   Earle GellBenjamin W Cartner, PA-C  ciprofloxacin (CIPRO) 500 MG tablet Take 1 tablet (500 mg total) by mouth 2 (two) times daily. One po bid x 10 days Patient not taking: Reported on 03/26/2014 01/11/14   Donnita FallsMercedes Strupp Camprubi-Soms, PA-C  cyclobenzaprine (FLEXERIL) 10 MG tablet Take 1 tablet (10 mg total) by mouth 2 (two) times daily as needed for muscle spasms. Patient not taking: Reported on 03/26/2014 12/07/13   Arthor CaptainAbigail Harris, PA-C  HYDROcodone-acetaminophen (NORCO) 5-325 MG per tablet Take 1-2 tablets by mouth every 6 (six) hours as needed for severe pain. Patient not taking: Reported on 03/26/2014 01/11/14   Donnita FallsMercedes  Strupp Camprubi-Soms, PA-C  naproxen (NAPROSYN) 500 MG tablet Take 1 tablet (500 mg total) by mouth 2 (two) times daily as needed for mild pain, moderate pain or headache (TAKE WITH MEALS.). Patient not taking: Reported on 06/11/2014 03/27/14   Earle GellBenjamin W Cartner, PA-C  ondansetron (ZOFRAN) 8 MG tablet Take 1 tablet (8 mg total) by mouth every 8 (eight) hours as needed for nausea or vomiting. Patient not taking: Reported on 06/11/2014 03/27/14   Earle GellBenjamin W Cartner, PA-C  phenazopyridine (PYRIDIUM) 200 MG tablet Take 1 tablet (200 mg total) by mouth 3 (three) times daily. Patient not taking: Reported on 06/11/2014 03/27/14   Earle GellBenjamin W Cartner, PA-C   BP 126/79 mmHg  Pulse 100  Temp(Src) 99 F (37.2 C) (Oral)  Resp 18  SpO2 100%  LMP 05/01/2014 Physical Exam  Constitutional: She is oriented to person, place, and time. She appears well-developed and well-nourished.  Cardiovascular: Normal rate and regular rhythm.   Pulmonary/Chest: Effort normal and breath sounds normal.  Abdominal: Bowel sounds are normal. There is no tenderness.  Genitourinary:  Small amount of bloody discharge. Left adnexal tenderness  Musculoskeletal: Normal range of motion.  Neurological: She is alert and oriented to person, place, and time.  Skin: Skin is warm and dry.  Psychiatric: She has a normal mood and affect.  Nursing note and vitals reviewed.   ED Course  Procedures (including critical care time) Labs Review Labs Reviewed  WET PREP, GENITAL - Abnormal; Notable for the following:    Clue Cells Wet Prep HPF POC FEW (*)    WBC, Wet Prep HPF POC TOO NUMEROUS TO COUNT (*)    All other components within normal limits  URINALYSIS, ROUTINE W REFLEX MICROSCOPIC - Abnormal; Notable for the following:    Hgb urine dipstick TRACE (*)    Nitrite POSITIVE (*)    Leukocytes, UA SMALL (*)    All other components within normal limits  CBC WITH DIFFERENTIAL/PLATELET - Abnormal; Notable for the following:    Hemoglobin  11.6 (*)    HCT 35.6 (*)    MCH 25.7 (*)    All other components within normal limits  URINE MICROSCOPIC-ADD ON - Abnormal; Notable for the following:    Squamous Epithelial / LPF FEW (*)    Bacteria, UA MANY (*)    All other components within normal limits  HIV ANTIBODY (ROUTINE TESTING)  RPR  I-STAT BETA HCG BLOOD, ED ([facility], [facility], [facility] ONLY)  I-STAT CHEM 8, ED  GC/CHLAMYDIA PROBE AMP ([place])    Imaging Review [place] Transvaginal Non-ob  06/11/2014   CLINICAL DATA:  Vaginal bleeding.  Pain  EXAM: TRANSABDOMINAL AND TRANSVAGINAL ULTRASOUND OF PELVIS  TECHNIQUE: Both transabdominal and transvaginal ultrasound examinations of the pelvis were performed. Transabdominal technique was performed for  global imaging of the pelvis including uterus, ovaries, adnexal regions, and pelvic cul-de-sac. It was necessary to proceed with endovaginal exam following the transabdominal exam to visualize the uterus, endometrium and ovaries.  COMPARISON:  None  FINDINGS: Uterus  Measurements: 8 x 4.0 x 4.9 cm. No fibroids or other mass visualized.  Endometrium  Thickness: 4 mm.  No focal abnormality visualized.  Right ovary  Measurements: 2.2 x 1.5 x 1.9 cm. Small follicle measuring 1 cm. Normal appearance/no adnexal mass.  Left ovary  Measurements: 2.5 x 2.4 x 1.6 cm. Normal appearance/no adnexal mass.  Other findings  No free fluid.  IMPRESSION: 1. Normal pelvic sonogram.   Electronically Signed   By: Signa Kell M.D.   On: 06/11/2014 12:09   US Pelvis Complete  06/11/2014   CLINICAL DATA:  Vaginal bleeding.  Pain  EXAM: TRANSABDOMINAL AND TRANSVAGINAL ULTRASOUND OF PELVIS  TECHNIQUE: Both transabdominal and transvaginal ultrasound examinations of the pelvis were performed. Transabdominal technique was performed for global imaging of the pelvis including uterus, ovaries, adnexal regions, and pelvic cul-de-sac. It was necessary to proceed with endovaginal exam following the transabdominal exam to visualize the uterus,  endometrium and ovaries.  COMPARISON:  None  FINDINGS: Uterus  Measurements: 8 x 4.0 x 4.9 cm. No fibroids or other mass visualized.  Endometrium  Thickness: 4 mm.  No focal abnormality visualized.  Right ovary  Measurements: 2.2 x 1.5 x 1.9 cm. Small follicle measuring 1 cm. Normal appearance/no adnexal mass.  Left ovary  Measurements: 2.5 x 2.4 x 1.6 cm. Normal appearance/no adnexal mass.  Other findings  No free fluid.  IMPRESSION: 1. Normal pelvic sonogram.   Electronically Signed   By: Signa Kell M.D.   On: 06/11/2014 12:09     EKG Interpretation None      MDM   Final diagnoses:  Vaginal bleeding  UTI (lower urinary tract infection)  Screening for STD (sexually transmitted disease)    Will treat for uti. No cyst noted. No anemia noted. Discussed follow up with the health department for continued symptoms or worsen. Std cultures sent    Teressa Lower, NP 06/11/14 1705  Gerhard Munch, MD 06/12/14 1152

## 2014-06-12 LAB — GC/CHLAMYDIA PROBE AMP (~~LOC~~) NOT AT ARMC
CHLAMYDIA, DNA PROBE: NEGATIVE
NEISSERIA GONORRHEA: NEGATIVE

## 2014-06-12 LAB — HIV ANTIBODY (ROUTINE TESTING W REFLEX): HIV Screen 4th Generation wRfx: NONREACTIVE

## 2014-06-12 LAB — RPR: RPR: NONREACTIVE

## 2014-09-10 ENCOUNTER — Encounter (HOSPITAL_COMMUNITY): Payer: Self-pay | Admitting: Physical Medicine and Rehabilitation

## 2014-09-10 ENCOUNTER — Emergency Department (HOSPITAL_COMMUNITY)
Admission: EM | Admit: 2014-09-10 | Discharge: 2014-09-10 | Disposition: A | Discharge status: Home / Self Care | Payer: Medicaid Other | Attending: Emergency Medicine | Admitting: Emergency Medicine

## 2014-09-10 DIAGNOSIS — Z79899 Other long term (current) drug therapy: Secondary | ICD-10-CM | POA: Insufficient documentation

## 2014-09-10 DIAGNOSIS — R519 Headache, unspecified: Secondary | ICD-10-CM

## 2014-09-10 DIAGNOSIS — Z8719 Personal history of other diseases of the digestive system: Secondary | ICD-10-CM | POA: Insufficient documentation

## 2014-09-10 DIAGNOSIS — H538 Other visual disturbances: Secondary | ICD-10-CM | POA: Insufficient documentation

## 2014-09-10 DIAGNOSIS — Z87440 Personal history of urinary (tract) infections: Secondary | ICD-10-CM | POA: Insufficient documentation

## 2014-09-10 DIAGNOSIS — E0590 Thyrotoxicosis, unspecified without thyrotoxic crisis or storm: Secondary | ICD-10-CM | POA: Insufficient documentation

## 2014-09-10 DIAGNOSIS — R110 Nausea: Secondary | ICD-10-CM | POA: Insufficient documentation

## 2014-09-10 DIAGNOSIS — Z862 Personal history of diseases of the blood and blood-forming organs and certain disorders involving the immune mechanism: Secondary | ICD-10-CM | POA: Insufficient documentation

## 2014-09-10 DIAGNOSIS — H53149 Visual discomfort, unspecified: Secondary | ICD-10-CM | POA: Insufficient documentation

## 2014-09-10 DIAGNOSIS — R51 Headache: Secondary | ICD-10-CM | POA: Insufficient documentation

## 2014-09-10 DIAGNOSIS — Z8739 Personal history of other diseases of the musculoskeletal system and connective tissue: Secondary | ICD-10-CM | POA: Insufficient documentation

## 2014-09-10 DIAGNOSIS — I10 Essential (primary) hypertension: Secondary | ICD-10-CM | POA: Insufficient documentation

## 2014-09-10 MED ORDER — SODIUM CHLORIDE 0.9 % IV BOLUS (SEPSIS)
1000.0000 mL | Freq: Once | INTRAVENOUS | Status: AC
  Administered 2014-09-10: 1000 mL via INTRAVENOUS

## 2014-09-10 MED ORDER — METOCLOPRAMIDE HCL 5 MG/ML IJ SOLN
10.0000 mg | Freq: Once | INTRAMUSCULAR | Status: AC
  Administered 2014-09-10: 10 mg via INTRAVENOUS
  Filled 2014-09-10: qty 2

## 2014-09-10 MED ORDER — KETOROLAC TROMETHAMINE 30 MG/ML IJ SOLN
30.0000 mg | Freq: Once | INTRAMUSCULAR | Status: AC
  Administered 2014-09-10: 30 mg via INTRAVENOUS
  Filled 2014-09-10: qty 1

## 2014-09-10 MED ORDER — DIPHENHYDRAMINE HCL 50 MG/ML IJ SOLN
25.0000 mg | Freq: Once | INTRAMUSCULAR | Status: AC
  Administered 2014-09-10: 25 mg via INTRAVENOUS
  Filled 2014-09-10: qty 1

## 2014-09-10 NOTE — ED Provider Notes (Signed)
CSN: [id number]     Arrival date & time 09/10/14  1521 History   First MD Initiated Contact with Patient 09/10/14 1908     Chief Complaint  Patient presents with  . Headache  . Nausea     (Consider location/radiation/quality/duration/timing/severity/associated sxs/prior Treatment) HPI  Pt is a 29yo female with hx of hyperthyroidism, HTN, and headaches, presenting to ED with c/o gradual onset, gradually worsening, constant Left sided headache that is 8/10 at this time, associated nausea, blurred vision and photophobia. Pt states she normally takes ibuprofen and pain resolves but it has not.  Denies vomiting, fever, or chills. Denies recent increase in stress. No known head injuries. Pt does report having seen a headache specialist many years ago but was never prescribed a medication for her headaches. Denies hx of seizures or strokes. Denies numbness or weakness in arms or legs.   Past Medical History  Diagnosis Date  . Sickle cell trait   . Hyperthyroidism   . Sickle cell trait   . Cholecystitis 08/18/2011  . SVD (spontaneous vaginal delivery)     x 3 [facility]  . Hypertension   . History of recurrent UTIs     recurrent UTIs  . Headache(784.0)     otc med prn  . Joint pain     knees and shoulders - otc med prn   Past Surgical History  Procedure Laterality Date  . Cholecystectomy  08/19/2011    Procedure: LAPAROSCOPIC CHOLECYSTECTOMY WITH INTRAOPERATIVE CHOLANGIOGRAM;  Surgeon: Lodema PilotBrian Layton, DO;  Location: [facility] OR;  Service: General;  Laterality: N/A;  . Dilation and curettage of uterus      molar preg  . Laparoscopic bilateral salpingectomy Bilateral 05/09/2013    Procedure: LAPAROSCOPIC BILATERAL SALPINGECTOMY;  Surgeon: Allie BossierMyra C Dove, MD;  Location: [facility];  Service: Gynecology;  Laterality: Bilateral;   Family History  Problem Relation Age of Onset  . Diabetes Mother   . Cancer Father   . Diabetes Maternal Uncle   . Cancer Maternal Grandmother   . Cancer Maternal Grandfather   .  Hypertension Maternal Grandfather   . Diabetes Maternal Grandfather   . Sickle cell anemia Son   . Asthma Son   . Heart disease Paternal Grandfather   . Hearing loss Paternal Aunt    History  Substance Use Topics  . Smoking status: Never Smoker   . Smokeless tobacco: Never Used  . Alcohol Use: No   OB History    Gravida Para Term Preterm AB TAB SAB Ectopic Multiple Living   4 3 3  1  1   3      Review of Systems  Constitutional: Negative for fever, chills, appetite change and fatigue.  Eyes: Positive for photophobia and visual disturbance. Negative for pain and redness.  Respiratory: Negative for shortness of breath.   Gastrointestinal: Positive for nausea. Negative for vomiting, abdominal pain and diarrhea.  Musculoskeletal: Negative for neck pain and neck stiffness.  Neurological: Positive for facial asymmetry. Negative for dizziness, seizures, syncope, weakness, light-headedness and headaches.  All other systems reviewed and are negative.     Allergies  Review of patient's allergies indicates no known allergies.  Home Medications   Prior to Admission medications   Medication Sig Start Date End Date Taking? Authorizing Provider  ibuprofen (ADVIL,MOTRIN) 200 MG tablet Take 400 mg by mouth every 6 (six) hours as needed for moderate pain.   Yes Historical Provider, MD  labetalol (NORMODYNE) 100 MG tablet Take 0.5 tablets (50 mg total) by mouth  2 (two) times daily. 10/14/12  Yes Currie Paris, NP  methimazole (TAPAZOLE) 10 MG tablet Take 2 tablets (20 mg total) by mouth 2 (two) times daily. 09/30/12  Yes Ozella Rocks, MD   BP 128/86 mmHg  Pulse 88  Temp(Src) 99.8 F (37.7 C) (Oral)  Resp 16  SpO2 100% Physical Exam  Constitutional: She is oriented to person, place, and time. She appears well-developed and well-nourished. No distress.  HENT:  Head: Normocephalic and atraumatic.  Eyes: Conjunctivae and EOM are normal. Pupils are equal, round, and reactive to light.  No scleral icterus.  Neck: Normal range of motion. Neck supple.  Cardiovascular: Normal rate, regular rhythm and normal heart sounds.   Pulmonary/Chest: Effort normal and breath sounds normal. No respiratory distress. She has no wheezes. She has no rales. She exhibits no tenderness.  Abdominal: Soft. Bowel sounds are normal. She exhibits no distension and no mass. There is no tenderness. There is no rebound and no guarding.  Musculoskeletal: Normal range of motion.  Neurological: She is alert and oriented to person, place, and time. She has normal strength. No cranial nerve deficit or sensory deficit. She displays a negative Romberg sign. Coordination and gait normal. GCS eye subscore is 4. GCS verbal subscore is 5. GCS motor subscore is 6.  Skin: Skin is warm and dry. She is not diaphoretic.  Nursing note and vitals reviewed.   ED Course  Procedures (including critical care time) Labs Review Labs Reviewed - No data to display  Imaging Review No results found.   EKG Interpretation None      MDM   Final diagnoses:  Left-sided headache  Nausea    Pt is a 29yo female with hx of headaches, presenting to ED with HA similar to previous but more consistent.  No focal neuro deficit. Vitals: WNL.  Doubt SAH, CVA, or meningitis. No imaging or further workup indicated at this time. Will tx with IV fluids, toradol, reglan and benadryl.   9:07 PM Pt states HA has improved from 8/10 to 1/10. States she feels comfortable being discharged home. Will provide work note for tomorrow. Home care instructions provided. Return precautions provided. Pt verbalized understanding and agreement with tx plan.    Junius Finner, PA-C 09/10/14 2109  Eber Hong, MD 09/11/14 [phone]

## 2014-09-10 NOTE — ED Notes (Signed)
Pt presents to department for evaluation of headache, nausea and blurred vision. 8/10 pain upon arrival to department. Pt is alert and oriented x4.

## 2014-12-24 ENCOUNTER — Encounter (HOSPITAL_COMMUNITY): Payer: Self-pay | Admitting: *Deleted

## 2014-12-26 ENCOUNTER — Ambulatory Visit (HOSPITAL_COMMUNITY)
Admission: RE | Admit: 2014-12-26 | Discharge: 2014-12-26 | Disposition: A | Discharge status: Home / Self Care | Payer: Medicaid Other | Source: Ambulatory Visit | Attending: Obstetrics and Gynecology | Admitting: Obstetrics and Gynecology

## 2014-12-26 ENCOUNTER — Encounter (HOSPITAL_COMMUNITY): Payer: Self-pay

## 2014-12-26 VITALS — BP 112/74 | Temp 98.6°F | Ht 64.25 in | Wt 154.0 lb

## 2014-12-26 DIAGNOSIS — R87612 Low grade squamous intraepithelial lesion on cytologic smear of cervix (LGSIL): Secondary | ICD-10-CM

## 2014-12-26 NOTE — Progress Notes (Signed)
CLINIC:   [facility] [employer] [facility]  REASON FOR VISIT: Well-woman exam  HISTORY OF PRESENT ILLNESS:   Grace [name] is a 29 y.o. female who presents to the [facility] today for clinical breast exam. No previous mammogram. Her last pap smear was performed in July 2016 and showed LGSIL/mild dysplasia, and cells suspicious for high grade. 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 Follow-up was recommended in 1 year.   REVIEW OF SYSTEMS:   Denies breast pain, nodularity, skin changes, nipple inversion, or nipple discharge bilaterally.  Denies any pelvic pain, pressure, or abnormal vaginal bleeding.   ALLERGIES: No Known Allergies  MEDICATIONS:  Current outpatient prescriptions:  .  methimazole (TAPAZOLE) 10 MG tablet, Take 2 tablets (20 mg total) by mouth 2 (two) times daily., Disp: 120 tablet, Rfl: 3 .  ibuprofen (ADVIL,MOTRIN) 200 MG tablet, Take 400 mg by mouth every 6 (six) hours as needed for moderate pain., Disp: , Rfl:  .  labetalol (NORMODYNE) 100 MG tablet, Take 0.5 tablets (50 mg total) by mouth 2 (two) times daily. (Patient not taking: Reported on 12/26/2014), Disp: 60 tablet, Rfl: 0  PHYSICAL EXAM:   BP 112/74 mmHg  Temp(Src) 98.6 F (37 C) (Oral)  Ht 5' 4.25" (1.632 m)  Wt 154 lb (69.854 kg)  BMI 26.23 kg/m2  LMP 12/12/2014 (Approximate)  General: Well-nourished, well-appearing female in no acute distress.  She is unaccompanied in clinic today.  Grace Bang, LPN was present during physical exam for this patient.   Breasts: Bilateral breasts exposed and observed with patient standing (arms at side, arms on hips, arms on hips flexed forward, and arms over head).  No gross abnormalities including breast skin puckering or dimpling noted on observation.  Breasts symmetrical without evidence of skin redness, thickening, or peau d'orange appearance. No nipple retraction or nipple discharge noted bilaterally.  No breast nodularity palpated in  bilateral breasts. Axillary lymph nodes: No axillary lymphadenopathy bilaterally.     ASSESSMENT & PLAN:  1. Breast cancer screening: Grace [name] has no palpable breast abnormalities on her clinical breast exam today. Mammogram not indicated due to age. She was given instructions and educational materials regarding breast self-awareness. Grace [name] is aware of this plan and agrees with it.   2. Cervical cancer screening: Grace [name] had a recent abnormal pap. She is scheduled for a colposcopy at the [facility] in the near future.  She will be contacted by that clinic with results.     Grace [name] was encouraged to ask questions and all questions were answered to her satisfaction.      Grace Pare, DNP, AGPCNP-BC, [facility] [phone]

## 2015-01-15 ENCOUNTER — Other Ambulatory Visit (HOSPITAL_COMMUNITY)
Admission: RE | Admit: 2015-01-15 | Discharge: 2015-01-15 | Disposition: A | Discharge status: Home / Self Care | Payer: Self-pay | Source: Ambulatory Visit | Attending: Obstetrics and Gynecology | Admitting: Obstetrics and Gynecology

## 2015-01-15 ENCOUNTER — Ambulatory Visit (INDEPENDENT_AMBULATORY_CARE_PROVIDER_SITE_OTHER): Payer: Medicaid Other | Admitting: Obstetrics and Gynecology

## 2015-01-15 ENCOUNTER — Telehealth: Payer: Self-pay | Admitting: Obstetrics and Gynecology

## 2015-01-15 ENCOUNTER — Encounter: Payer: Self-pay | Admitting: Obstetrics & Gynecology

## 2015-01-15 VITALS — BP 125/84 | HR 90 | Temp 98.5°F | Resp 20 | Ht 64.0 in | Wt 151.3 lb

## 2015-01-15 DIAGNOSIS — IMO0002 Reserved for concepts with insufficient information to code with codable children: Secondary | ICD-10-CM

## 2015-01-15 DIAGNOSIS — R896 Abnormal cytological findings in specimens from other organs, systems and tissues: Secondary | ICD-10-CM

## 2015-01-15 DIAGNOSIS — Z01812 Encounter for preprocedural laboratory examination: Secondary | ICD-10-CM

## 2015-01-15 LAB — POCT PREGNANCY, URINE: Preg Test, Ur: NEGATIVE

## 2015-01-15 MED ORDER — IBUPROFEN 200 MG PO TABS
600.0000 mg | ORAL_TABLET | Freq: Once | ORAL | Status: AC
  Administered 2015-01-15: 600 mg via ORAL

## 2015-01-15 NOTE — Addendum Note (Signed)
Addended by: Jill Side on: 01/15/2015 03:20 PM   Modules accepted: Orders

## 2015-01-15 NOTE — Addendum Note (Signed)
Addended by: Shonna Chock B on: 01/15/2015 04:00 PM   Modules accepted: Orders, Level of Service

## 2015-01-15 NOTE — Telephone Encounter (Signed)
U/s shows possible adenomyosis. Will arrange f/u to discuss tx options.

## 2015-01-15 NOTE — Progress Notes (Signed)
GYNECOLOGY CLINIC COLPOSCOPY VISIT AND PROCEDURE NOTE  29 y.o. [id number] here for colposcopy for LSIL cannot rule-out high-grade, HPV positive w/o differentiation pap smear on 10/2014. 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 We do not have those actual results, but that history is written on patient's most current pap (those records are all from the health department). The patient is not a cigarette smoker. The patient is not immunosuppressed. The patient is not pregnant. The patient is not taking anticoagulants and denies an allergy to iodine.  Patient given informed consent, signed copy in the chart, time out was performed.  Placed in lithotomy position.   Gross findings:   Vagina: normal  Vulva: normal  Cervix: normal  Visualization after:  Acetic acid: transformation zone seen just protruding from superior margin of external os. Cervical speculum inserted and complete transformation zone visualized just inside the external os.  Lugol's solution: sharp acetowhite borders most pronounced at 12 and 6 o'clock, with punctation.  Upper one-third of vagina examined, revealing: normal  Biopsies obtained: two: 6 and 12 o'clock  ECC specimen obtained: obtained  Colposcopy adequate? Yes  All specimens were labelled and sent to pathology.  Patient was given post procedure instructions.  Will follow up pathology and manage accordingly.      Shonna Chock, MD OB/GYN Fellow

## 2015-01-22 ENCOUNTER — Telehealth: Payer: Self-pay | Admitting: Obstetrics and Gynecology

## 2015-01-22 NOTE — Telephone Encounter (Signed)
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 Will have our nursing staff call to offer repeat co-testing in 1 year or excisional/ablative procedure.

## 2015-01-23 ENCOUNTER — Telehealth: Payer: Self-pay | Admitting: *Deleted

## 2015-01-23 NOTE — Telephone Encounter (Signed)
Grace [name] called and left a message this morning requesting results of her colposcopy from last week.

## 2015-01-24 NOTE — Telephone Encounter (Signed)
Per Dr. Ashok Pall, pt results of colpo resulted CIN 1 same as last year; he recommends two options rpt pap in one year or she can speak with surgeon concerning other management.  Called pt and informed pt of providers recommendations and pt requested to have a rpt pap in one year.  I advised pt that if she does not have insurance that she can contact [facility] again for the rpt pap smear.  Pt stated understanding.

## 2015-03-05 NOTE — Addendum Note (Signed)
Encounter addended by: Priscille Heidelberghristine P Pattrick Bady, RN on: 03/05/2015  4:41 PM<BR>     Documentation filed: Charges VN

## 2015-03-08 ENCOUNTER — Encounter (HOSPITAL_COMMUNITY): Payer: Self-pay

## 2015-03-08 ENCOUNTER — Emergency Department (HOSPITAL_COMMUNITY)
Admission: EM | Admit: 2015-03-08 | Discharge: 2015-03-08 | Disposition: A | Discharge status: Home / Self Care | Payer: Self-pay | Attending: Emergency Medicine | Admitting: Emergency Medicine

## 2015-03-08 DIAGNOSIS — Z79899 Other long term (current) drug therapy: Secondary | ICD-10-CM | POA: Insufficient documentation

## 2015-03-08 DIAGNOSIS — Z87440 Personal history of urinary (tract) infections: Secondary | ICD-10-CM | POA: Insufficient documentation

## 2015-03-08 DIAGNOSIS — Z862 Personal history of diseases of the blood and blood-forming organs and certain disorders involving the immune mechanism: Secondary | ICD-10-CM | POA: Insufficient documentation

## 2015-03-08 DIAGNOSIS — I10 Essential (primary) hypertension: Secondary | ICD-10-CM | POA: Insufficient documentation

## 2015-03-08 DIAGNOSIS — G43809 Other migraine, not intractable, without status migrainosus: Secondary | ICD-10-CM | POA: Insufficient documentation

## 2015-03-08 DIAGNOSIS — Z8719 Personal history of other diseases of the digestive system: Secondary | ICD-10-CM | POA: Insufficient documentation

## 2015-03-08 DIAGNOSIS — E0590 Thyrotoxicosis, unspecified without thyrotoxic crisis or storm: Secondary | ICD-10-CM | POA: Insufficient documentation

## 2015-03-08 MED ORDER — METOCLOPRAMIDE HCL 5 MG/ML IJ SOLN
10.0000 mg | Freq: Once | INTRAMUSCULAR | Status: AC
  Administered 2015-03-08: 10 mg via INTRAVENOUS
  Filled 2015-03-08: qty 2

## 2015-03-08 MED ORDER — SODIUM CHLORIDE 0.9 % IV SOLN: 1000.0000 mL | INTRAVENOUS | Status: DC

## 2015-03-08 MED ORDER — KETOROLAC TROMETHAMINE 30 MG/ML IJ SOLN
30.0000 mg | Freq: Once | INTRAMUSCULAR | Status: AC
  Administered 2015-03-08: 30 mg via INTRAVENOUS
  Filled 2015-03-08: qty 1

## 2015-03-08 MED ORDER — DIPHENHYDRAMINE HCL 50 MG/ML IJ SOLN
25.0000 mg | Freq: Once | INTRAMUSCULAR | Status: AC
Start: 2015-03-08 — End: 2015-03-08
  Administered 2015-03-08: 25 mg via INTRAVENOUS
  Filled 2015-03-08: qty 1

## 2015-03-08 MED ORDER — SODIUM CHLORIDE 0.9 % IV SOLN
1000.0000 mL | Freq: Once | INTRAVENOUS | Status: AC
  Administered 2015-03-08: 1000 mL via INTRAVENOUS

## 2015-03-08 NOTE — Discharge Instructions (Signed)
Migraine Headache °A migraine headache is an intense, throbbing pain on one or both sides of your head. A migraine can last for 30 minutes to several hours. °CAUSES  °The exact cause of a migraine headache is not always known. However, a migraine may be caused when nerves in the brain become irritated and release chemicals that cause inflammation. This causes pain. °Certain things may also trigger migraines, such as: °· Alcohol. °· Smoking. °· Stress. °· Menstruation. °· Aged cheeses. °· Foods or drinks that contain nitrates, glutamate, aspartame, or tyramine. °· Lack of sleep. °· Chocolate. °· Caffeine. °· Hunger. °· Physical exertion. °· Fatigue. °· Medicines used to treat chest pain (nitroglycerine), birth control pills, estrogen, and some blood pressure medicines. °SIGNS AND SYMPTOMS °· Pain on one or both sides of your head. °· Pulsating or throbbing pain. °· Severe pain that prevents daily activities. °· Pain that is aggravated by any physical activity. °· Nausea, vomiting, or both. °· Dizziness. °· Pain with exposure to bright lights, loud noises, or activity. °· General sensitivity to bright lights, loud noises, or smells. °Before you get a migraine, you may get warning signs that a migraine is coming (aura). An aura may include: °· Seeing flashing lights. °· Seeing bright spots, halos, or zigzag lines. °· Having tunnel vision or blurred vision. °· Having feelings of numbness or tingling. °· Having trouble talking. °· Having muscle weakness. °DIAGNOSIS  °A migraine headache is often diagnosed based on: °· Symptoms. °· Physical exam. °· A CT scan or MRI of your head. These imaging tests cannot diagnose migraines, but they can help rule out other causes of headaches. °TREATMENT °Medicines may be given for pain and nausea. Medicines can also be given to help prevent recurrent migraines.  °HOME CARE INSTRUCTIONS °· Only take over-the-counter or prescription medicines for pain or discomfort as directed by your  health care provider. The use of long-term narcotics is not recommended. °· Lie down in a dark, quiet room when you have a migraine. °· Keep a journal to find out what may trigger your migraine headaches. For example, write down: °¨ What you eat and drink. °¨ How much sleep you get. °¨ Any change to your diet or medicines. °· Limit alcohol consumption. °· Quit smoking if you smoke. °· Get 7-9 hours of sleep, or as recommended by your health care provider. °· Limit stress. °· Keep lights dim if bright lights bother you and make your migraines worse. °SEEK IMMEDIATE MEDICAL CARE IF:  °· Your migraine becomes severe. °· You have a fever. °· You have a stiff neck. °· You have vision loss. °· You have muscular weakness or loss of muscle control. °· You start losing your balance or have trouble walking. °· You feel faint or pass out. °· You have severe symptoms that are different from your first symptoms. °MAKE SURE YOU:  °· Understand these instructions. °· Will watch your condition. °· Will get help right away if you are not doing well or get worse. °  °This information is not intended to replace advice given to you by your health care provider. Make sure you discuss any questions you have with your health care provider. °  °Document Released: 04/05/2005 Document Revised: 04/26/2014 Document Reviewed: 12/11/2012 °Elsevier Interactive Patient Education ©2016 Elsevier Inc. ° ° °Emergency Department Resource Guide °1) Find a Doctor and Pay Out of Pocket °Although you won't have to find out who is covered by your insurance plan, it is a good idea to   around and get recommendations. You will then need to call the office and see if the doctor you have chosen will accept you as a new patient and what types of options they offer for patients who are self-pay. Some doctors offer discounts or will set up payment plans for their patients who do not have insurance, but you will need to ask so you aren't surprised when you get to  your appointment.  2) Contact Your Local Health Department Not all health departments have doctors that can see patients for sick visits, but many do, so it is worth a call to see if yours does. If you don't know where your local health department is, you can check in your phone book. The CDC also has a tool to help you locate your state's health department, and many state websites also have listings of all of their local health departments.  3) Find a [facility] If your illness is not likely to be very severe or complicated, you may want to try a walk in clinic. These are popping up all over the country in pharmacies, drugstores, and shopping centers. They're usually staffed by nurse practitioners or physician assistants that have been trained to treat common illnesses and complaints. They're usually fairly quick and inexpensive. However, if you have serious medical issues or chronic medical problems, these are probably not your best option.  No Primary Care Doctor: - Call Health Connect at  [phone] - they can help you locate a primary care doctor that  accepts your insurance, provides certain services, etc. - Physician Referral Service- [phone]  Chronic Pain Problems: Organization         Address  Phone   Notes  [facility]  [phone] Patients need to be referred by their primary care doctor.   Medication Assistance: Organization         Address  Phone   Notes  [facility] Medication [facility] [place], [place] [address] [phone] --Must be a resident of [facility] -- Must have NO insurance coverage whatsoever (no Medicaid/ Medicare, etc.) -- The pt. MUST have a primary care doctor that directs their care regularly and follows them in the community   Med[facility]ist  [phone]   [employer]  [phone]    Agencies that provide inexpensive medical care: Organization         Address  Phone    Notes  [place]  [phone]   [facility]    [phone]   [facility] [place], [place] [address] [phone]   [place] [address] [place] [phone]   Planned Parenthood    [phone]   [facility]    [phone]   [place] and [place] [address], [place] Phone:  [phone], Fax:  [phone] Hours of Operation:  9 am - 6 pm, M-F.  Also accepts Medicaid/Medicare and self-pay.  [facility] for [place], [address], [place] Phone: [phone], Fax: [phone]. Hours of Operation:  8:30 am - 5:30 pm, M-F.  Also accepts Medicaid and self-pay.  [facility] [address], [place] Phone: [phone]   [place], [place], [place] [phone], Ext. [phone] Mondays & Thursdays: 7-9 AM.  First 15 patients are seen  on a first come, first serve basis.    [place] Providers:  Organization         Address  Phone   Notes  [facility] [address], [place] [phone] Also accepts self-pay patients.  [facility] [address], [place]  [phone]   [place], [address], [place] [phone]   [facility] [address], [place] [phone]   Lucianne Lei [address], [place]   [phone] Only accepts [place] Access [place] patients after they have their name applied to their card.   Self-Pay (no insurance) in [facility]:  Organization         Address  Phone   Notes  Sickle Cell Patients, [facility] Internal Medicine [place] [phone]   [facility] Urgent Care Ali [place]ina [phone]   [facility]  [facility] [place]  [place], [place], [place] [phone]   [facility]/Dr. Osei-Bonsu  [address] [place], [place] [phone] Phone number for both [place] and [name] locations is the same.  [facility] [address], [place] [phone]   [facility] [address] [place] or [address] [phone]   [facility] [address] [place] [phone], phone; [phone], fax Sees patients 1st and 3rd Saturday of every month.  Must not qualify for public or private insurance (i.e. Medicaid, Medicare, [place] Health Choice, Veterans' Benefits)  Household income should be no more than 200% of the poverty level The clinic cannot treat you if you are pregnant or think you are pregnant  Sexually transmitted diseases are not treated at the clinic.    Dental Care: Organization         Address  Phone  Notes  [facility] Department of [facility] [place] [phone] Accepts children up to age 68 who are enrolled in [place] or [place]; pregnant women with a Medicaid card; and children who have applied for Medicaid or [place] Health Choice, but were declined, whose parents can pay a reduced fee at time of service.  [facility] Department of [facility]  [address], [place] [phone] Accepts children up to age 19 who are enrolled in [place] or [place]; pregnant women with a Medicaid card; and children who have applied for Medicaid or [place] Health Choice, but were declined, whose parents can pay a reduced fee at time of service.  [place] Adult Dental Access PROGRAM  [place] [phone] Patients are seen by appointment only. Walk-ins are not accepted. [place] will see patients 35 years of age  and older. Monday - Tuesday (8am-5pm) Most Wednesdays (8:30-5pm) $30 per visit, cash only  [facility] Adult Dental Access PROGRAM  [address], [facility] [phone] Patients are seen by appointment only. Walk-ins are not accepted. [place] will see patients 34 years of age and older. One Wednesday Evening (Monthly: Volunteer Based).  $30 per visit, cash only  [place]  [phone] for adults; Children under age 56, call Graduate Pediatric Dentistry at [phone].  Children aged 72-14, please call [phone] to request a pediatric application.  Dental services are provided in all areas of dental care including fillings, crowns and bridges, complete and partial dentures, implants, gum treatment, root canals, and extractions. Preventive care is also provided. Treatment is provided to both adults and children. Patients are selected via a lottery and there is often a waiting list.   [facility] [address] [place]  [phone] [email or web address]   [facility] [address] [place], [place] [phone], Ext. [phone] Second and Fourth Thursday of each month, opens at 6:30 AM; Clinic ends at 9 AM.  Patients are seen on a first-come first-served basis, and a limited number are seen during each clinic.   [facility]  [address] [place]ard Danker [place], [place] [phone]   Eligibility Requirements You must have lived in [place], [place], or [place] counties for at least the last three months.   You cannot be eligible for state or federal sponsored [employer], including [employer], [place], or [employer].   You generally cannot be eligible for healthcare insurance through your employer.    How to apply: Eligibility screenings are held every Tuesday and Wednesday afternoon from 1:00 pm until 4:00 pm. You do not need an appointment for the interview!  [facility] [address], [place], [place]   [place]  [place] Department  [place]  [phone]    Behavioral Health Resources in the Community: Intensive Outpatient Programs Organization         Address  Phone  Notes  [place]. [address] [place], [place] [phone]   [facility] Outpatient [address], [place], [place]   ADS: Alcohol & Drug Svcs [address], [place], [place]   [place] [address]  [place], [place] or [phone]   Substance Abuse Resources Organization         Address  Phone  Notes  Alcohol and Drug Services  [phone]   [place]  [phone]   [facility] [place]   Chinita Pester  [phone]   Residential & Outpatient Substance Abuse Program  [phone]   Psychological Services Organization         Address  Phone  Notes  [facility] [place]  [place]  [phone]   [place] [address], [place] or [phone]    Mobile Crisis Teams Organization         Address  Phone  Notes  [facility], Mobile Crisis Care Unit  [phone]   [facility]  [address] [place], [place]   [place] [address], [place] [phone]    Self-Help/Support Groups Organization         Address  Phone             Notes  [place]. of [place] - variety of support groups  [place] Call for more information  Narcotics Anonymous (NA), [facility] [address], [place]  2 meetings at this location   Special educational needs teacher         Address  Phone  Notes  [facility] [place],    [place]  [phone]  [facility]  [address], [place] [address], [place], [place] [phone]   [facility] [address] [place], [place] [phone] Admissions: 8am-3pm M-F  [facility] [address],    [place], [place] [phone]   [facility] [address] [place], [place], [place] [phone]   [facility] [address],  [place], [place] [phone]   [facility] - Intensive Outpatient [address]ure[address], [place], [place] [phone]   [facility] ([facility] [address]  [place], [place] [phone] or [phone]   [facility] Services ([facility]) [address], [place], [place] [phone] Accepts Medicaid  [facility] [address],  [place] [phone] Substance Abuse/Addiction Treatment   [facility] Organization         Address  Phone  Notes  [facility]  [phone]   Angie Fava, PhD [address] Ervin Kn[place], [place]   [phone] or [phone]   [facility]   [address] [place], [place] [phone]   [facility] [address] [place], [place] [phone] Insurance/Medicaid/sponsorship through [facility] and Families [address]                                    [place], [place] [phone] Therapy/tele-psych/case  [facility] [address], [place] [phone]    Dr. Lolly Mustache  [phone]   [facility]  [employer] [facility] Dept. 1) [address] [place] 2) [address] [place] 3)  [address], [place] [phone]  [phone]   [facility] Child Abuse Hotline 772-736-3458 or [phone] (After Hours)

## 2015-03-08 NOTE — ED Provider Notes (Signed)
CSN: [id number]     Arrival date & time 03/08/15  0716 History   First MD Initiated Contact with Patient 03/08/15 0745     Chief Complaint  Patient presents with  . Migraine     (Consider location/radiation/quality/duration/timing/severity/associated sxs/prior Treatment) HPI 3 days of headache. She reports it was gradual in onset. Initially it was her whole head and now his concentration of the right side of her head. Typically her headaches will be either the entire head or right or left. She reports they also usually will radiate to the back of her neck. She states she does have light and sound sensitivity. There is nausea without vomiting. No focal neurologic deficit. No gait difficulty. No fevers no chills. No sinus ear or throat symptoms. Patient has tried migraine Excedrin at home without relief. She states she's had headaches for as long as she can remember. Past Medical History  Diagnosis Date  . Sickle cell trait (HCC)   . Hyperthyroidism   . Sickle cell trait (HCC)   . Cholecystitis 08/18/2011  . SVD (spontaneous vaginal delivery)     x 3 WH  . Hypertension   . History of recurrent UTIs     recurrent UTIs  . Headache(784.0)     otc med prn  . Joint pain     knees and shoulders - otc med prn   Past Surgical History  Procedure Laterality Date  . Cholecystectomy  08/19/2011    Procedure: LAPAROSCOPIC CHOLECYSTECTOMY WITH INTRAOPERATIVE CHOLANGIOGRAM;  Surgeon: Lodema Pilot, DO;  Location: [facility] OR;  Service: General;  Laterality: N/A;  . Dilation and curettage of uterus      molar preg  . Laparoscopic bilateral salpingectomy Bilateral 05/09/2013    Procedure: LAPAROSCOPIC BILATERAL SALPINGECTOMY;  Surgeon: Allie Bossier, MD;  Location: [facility] ORS;  Service: Gynecology;  Laterality: Bilateral;   Family History  Problem Relation Age of Onset  . Diabetes Mother   . Cancer Father   . Diabetes Maternal Uncle   . Cancer Maternal Grandmother   . Cancer Maternal Grandfather   .  Hypertension Maternal Grandfather   . Diabetes Maternal Grandfather   . Sickle cell anemia Son   . Asthma Son   . Heart disease Paternal Grandfather   . Hearing loss Paternal Aunt    Social History  Substance Use Topics  . Smoking status: Never Smoker   . Smokeless tobacco: Never Used  . Alcohol Use: No   OB History    Gravida Para Term Preterm AB TAB SAB Ectopic Multiple Living   Review of Systems 10 Systems reviewed and are negative for acute change except as noted in the HPI.    Allergies  Review of patient's allergies indicates no known allergies.  Home Medications   Prior to Admission medications   Medication Sig Start Date End Date Taking? Authorizing Provider  ibuprofen (ADVIL,MOTRIN) 200 MG tablet Take 400 mg by mouth every 6 (six) hours as needed for moderate pain.   Yes Historical Provider, MD  methimazole (TAPAZOLE) 10 MG tablet Take 2 tablets (20 mg total) by mouth 2 (two) times daily. 09/30/12  Yes Ozella Rocks, MD   BP 109/85 mmHg  Pulse 88  Temp(Src) 98.3 F (36.8 C) (Oral)  Resp 20  Ht  (1.626 m)  Wt 151 lb (68.493 kg)  BMI 25.91 kg/m2  SpO2 100%  LMP 02/18/2015 Physical Exam  Constitutional: She is  oriented to person, place, and time. She appears well-developed and well-nourished.  HENT:  Head: Normocephalic and atraumatic.  Right Ear: External ear normal.  Left Ear: External ear normal.  Nose: Nose normal.  Mouth/Throat: Oropharynx is clear and moist.  Eyes: EOM are normal. Pupils are equal, round, and reactive to light.  Neck: Neck supple.  Cardiovascular: Normal rate, regular rhythm, normal heart sounds and intact distal pulses.   Pulmonary/Chest: Effort normal and breath sounds normal.  Abdominal: Soft. Bowel sounds are normal. She exhibits no distension. There is no tenderness.  Musculoskeletal: Normal range of motion. She exhibits no edema.  Neurological: She is alert and oriented to person, place, and time.  She has normal strength. No cranial nerve deficit. She exhibits normal muscle tone. Coordination normal. GCS eye subscore is 4. GCS verbal subscore is 5. GCS motor subscore is 6.  Skin: Skin is warm, dry and intact.  Psychiatric: She has a normal mood and affect.    ED Course  Procedures (including critical care time) Labs Review Labs Reviewed - No data to display  Imaging Review No results found. I have personally reviewed and evaluated these images and lab results as part of my medical decision-making.   EKG Interpretation None     09:10 patient's headache has resolved. MDM   Final diagnoses:  Other migraine without status migrainosus, not intractable   Patient presents with recurrent headaches. She is well in appearance. She has no other associated symptoms. Headache has resolved with migraine cocktail. At this time she'll be discharged with instructions to schedule primary care follow-up for ongoing management.    Arby BarretteMarcy Jakeira Seeman, MD 03/08/15 [phone]

## 2015-03-08 NOTE — ED Notes (Signed)
Patient undressed, in gown, on continuous pulse oximetry and blood pressure cuff 

## 2015-03-08 NOTE — ED Notes (Signed)
Pt. Coming from home with a headache x3 days. Pt. Also experience nausea, but no vomiting. Pt. Took ibuprofen with no relief. Pt. Not able to sleep since headache started. Pt. Claims to have hx of migraines.

## 2015-03-08 NOTE — ED Notes (Signed)
EDP at bedside  

## 2015-03-15 ENCOUNTER — Encounter (HOSPITAL_COMMUNITY): Payer: Self-pay | Admitting: *Deleted

## 2015-03-15 DIAGNOSIS — Z862 Personal history of diseases of the blood and blood-forming organs and certain disorders involving the immune mechanism: Secondary | ICD-10-CM | POA: Insufficient documentation

## 2015-03-15 DIAGNOSIS — E0590 Thyrotoxicosis, unspecified without thyrotoxic crisis or storm: Secondary | ICD-10-CM | POA: Insufficient documentation

## 2015-03-15 DIAGNOSIS — I10 Essential (primary) hypertension: Secondary | ICD-10-CM | POA: Insufficient documentation

## 2015-03-15 DIAGNOSIS — F329 Major depressive disorder, single episode, unspecified: Secondary | ICD-10-CM | POA: Insufficient documentation

## 2015-03-15 DIAGNOSIS — Z79899 Other long term (current) drug therapy: Secondary | ICD-10-CM | POA: Insufficient documentation

## 2015-03-15 DIAGNOSIS — Z87440 Personal history of urinary (tract) infections: Secondary | ICD-10-CM | POA: Insufficient documentation

## 2015-03-15 DIAGNOSIS — Z3202 Encounter for pregnancy test, result negative: Secondary | ICD-10-CM | POA: Insufficient documentation

## 2015-03-15 DIAGNOSIS — Z8719 Personal history of other diseases of the digestive system: Secondary | ICD-10-CM | POA: Insufficient documentation

## 2015-03-15 LAB — POC URINE PREG, ED: Preg Test, Ur: NEGATIVE

## 2015-03-15 LAB — RAPID URINE DRUG SCREEN, HOSP PERFORMED
AMPHETAMINES: NOT DETECTED
Barbiturates: NOT DETECTED
Benzodiazepines: NOT DETECTED
Cocaine: NOT DETECTED
OPIATES: NOT DETECTED
Tetrahydrocannabinol: NOT DETECTED

## 2015-03-15 NOTE — ED Notes (Signed)
Sitter arrived

## 2015-03-15 NOTE — ED Notes (Signed)
Patient presents tearful stating she hasn't slept in 3 days.  States she is so depressed and this time it is different then other times

## 2015-03-15 NOTE — ED Notes (Addendum)
Patient changed into burgundy scrubs and blue socks.  All belongings taken out of the room.  Security notified to wand patient and charge aware of the need for a sitter - staffing states they will not be having anyone to sit.

## 2015-03-15 NOTE — ED Notes (Signed)
Patient wanded and placed in the triage waiting area

## 2015-03-15 NOTE — ED Notes (Signed)
Staffing called for sitter.   

## 2015-03-16 ENCOUNTER — Inpatient Hospital Stay (HOSPITAL_COMMUNITY)
Admission: AD | Admit: 2015-03-16 | Discharge: 2015-04-01 | DRG: 885 | Disposition: A | Discharge status: Home / Self Care | Payer: Medicaid Other | Source: Intra-hospital | Attending: Psychiatry | Admitting: Psychiatry

## 2015-03-16 ENCOUNTER — Encounter (HOSPITAL_COMMUNITY): Payer: Self-pay | Admitting: *Deleted

## 2015-03-16 ENCOUNTER — Emergency Department (HOSPITAL_COMMUNITY)
Admission: EM | Admit: 2015-03-16 | Discharge: 2015-03-16 | Disposition: A | Discharge status: Other Acute Inpatient Hospital | Payer: Self-pay | Attending: Emergency Medicine | Admitting: Emergency Medicine

## 2015-03-16 DIAGNOSIS — R002 Palpitations: Secondary | ICD-10-CM

## 2015-03-16 DIAGNOSIS — R000 Tachycardia, unspecified: Secondary | ICD-10-CM

## 2015-03-16 DIAGNOSIS — F329 Major depressive disorder, single episode, unspecified: Secondary | ICD-10-CM

## 2015-03-16 DIAGNOSIS — Z23 Encounter for immunization: Secondary | ICD-10-CM

## 2015-03-16 DIAGNOSIS — I10 Essential (primary) hypertension: Secondary | ICD-10-CM | POA: Diagnosis present

## 2015-03-16 DIAGNOSIS — Z825 Family history of asthma and other chronic lower respiratory diseases: Secondary | ICD-10-CM

## 2015-03-16 DIAGNOSIS — E0590 Thyrotoxicosis, unspecified without thyrotoxic crisis or storm: Secondary | ICD-10-CM | POA: Diagnosis present

## 2015-03-16 DIAGNOSIS — F4001 Agoraphobia with panic disorder: Secondary | ICD-10-CM | POA: Diagnosis present

## 2015-03-16 DIAGNOSIS — F331 Major depressive disorder, recurrent, moderate: Secondary | ICD-10-CM | POA: Diagnosis not present

## 2015-03-16 DIAGNOSIS — Z8249 Family history of ischemic heart disease and other diseases of the circulatory system: Secondary | ICD-10-CM

## 2015-03-16 DIAGNOSIS — K219 Gastro-esophageal reflux disease without esophagitis: Secondary | ICD-10-CM | POA: Diagnosis present

## 2015-03-16 DIAGNOSIS — R079 Chest pain, unspecified: Secondary | ICD-10-CM | POA: Diagnosis present

## 2015-03-16 DIAGNOSIS — R45851 Suicidal ideations: Secondary | ICD-10-CM | POA: Diagnosis present

## 2015-03-16 DIAGNOSIS — D573 Sickle-cell trait: Secondary | ICD-10-CM | POA: Diagnosis present

## 2015-03-16 DIAGNOSIS — Z822 Family history of deafness and hearing loss: Secondary | ICD-10-CM

## 2015-03-16 DIAGNOSIS — Z833 Family history of diabetes mellitus: Secondary | ICD-10-CM

## 2015-03-16 DIAGNOSIS — F419 Anxiety disorder, unspecified: Secondary | ICD-10-CM | POA: Diagnosis present

## 2015-03-16 DIAGNOSIS — F32A Depression, unspecified: Secondary | ICD-10-CM

## 2015-03-16 LAB — COMPREHENSIVE METABOLIC PANEL
ALK PHOS: 38 U/L (ref 38–126)
ALT: 69 U/L — AB (ref 14–54)
ANION GAP: 9 (ref 5–15)
AST: 52 U/L — ABNORMAL HIGH (ref 15–41)
Albumin: 4.3 g/dL (ref 3.5–5.0)
BILIRUBIN TOTAL: 0.6 mg/dL (ref 0.3–1.2)
BUN: 7 mg/dL (ref 6–20)
CALCIUM: 9.6 mg/dL (ref 8.9–10.3)
CO2: 24 mmol/L (ref 22–32)
CREATININE: 0.75 mg/dL (ref 0.44–1.00)
Chloride: 106 mmol/L (ref 101–111)
GFR calc non Af Amer: >60 mL/min (ref >60)
Glucose, Bld: 98 mg/dL (ref 65–99)
Potassium: 4.5 mmol/L (ref 3.5–5.1)
Sodium: 139 mmol/L (ref 135–145)
TOTAL PROTEIN: 7.7 g/dL (ref 6.5–8.1)

## 2015-03-16 LAB — CBC
HEMATOCRIT: 37.7 % (ref 36.0–46.0)
Hemoglobin: 13.1 g/dL (ref 12.0–15.0)
MCH: 30.8 pg (ref 26.0–34.0)
MCHC: 34.7 g/dL (ref 30.0–36.0)
MCV: 88.5 fL (ref 78.0–100.0)
Platelets: 368 10*3/uL (ref 150–400)
RBC: 4.26 MIL/uL (ref 3.87–5.11)
RDW: 13.3 % (ref 11.5–15.5)
WBC: 10.4 10*3/uL (ref 4.0–10.5)

## 2015-03-16 LAB — ETHANOL: Alcohol, Ethyl (B): <5 mg/dL (ref <5)

## 2015-03-16 LAB — ACETAMINOPHEN LEVEL

## 2015-03-16 LAB — SALICYLATE LEVEL

## 2015-03-16 MED ORDER — LORAZEPAM 1 MG PO TABS
1.0000 mg | ORAL_TABLET | Freq: Three times a day (TID) | ORAL | Status: DC | PRN
  Administered 2015-03-16 – 2015-03-25 (×17): 1 mg via ORAL
  Filled 2015-03-16 (×17): qty 1

## 2015-03-16 MED ORDER — IBUPROFEN 600 MG PO TABS
600.0000 mg | ORAL_TABLET | Freq: Three times a day (TID) | ORAL | Status: DC | PRN
  Administered 2015-03-16 – 2015-03-19 (×3): 600 mg via ORAL
  Filled 2015-03-16 (×4): qty 1

## 2015-03-16 MED ORDER — ALUM & MAG HYDROXIDE-SIMETH 200-200-20 MG/5ML PO SUSP: 30.0000 mL | ORAL | Status: DC | PRN

## 2015-03-16 MED ORDER — ALUM & MAG HYDROXIDE-SIMETH 200-200-20 MG/5ML PO SUSP
30.0000 mL | ORAL | Status: DC | PRN
  Administered 2015-03-20 – 2015-03-24 (×4): 30 mL via ORAL
  Filled 2015-03-16 (×4): qty 30

## 2015-03-16 MED ORDER — ONDANSETRON HCL 4 MG PO TABS: 4.0000 mg | ORAL_TABLET | Freq: Three times a day (TID) | ORAL | Status: DC | PRN

## 2015-03-16 MED ORDER — TRAZODONE HCL 50 MG PO TABS
50.0000 mg | ORAL_TABLET | Freq: Every evening | ORAL | Status: DC | PRN
  Administered 2015-03-17: 50 mg via ORAL
  Filled 2015-03-16: qty 1

## 2015-03-16 MED ORDER — LORAZEPAM 1 MG PO TABS: 1.0000 mg | ORAL_TABLET | Freq: Three times a day (TID) | ORAL | Status: DC | PRN

## 2015-03-16 MED ORDER — SERTRALINE HCL 50 MG PO TABS
50.0000 mg | ORAL_TABLET | Freq: Every day | ORAL | Status: DC
  Administered 2015-03-16 – 2015-03-19 (×4): 50 mg via ORAL
  Filled 2015-03-16 (×7): qty 1

## 2015-03-16 MED ORDER — METHIMAZOLE 10 MG PO TABS
20.0000 mg | ORAL_TABLET | Freq: Two times a day (BID) | ORAL | Status: DC
  Administered 2015-03-16 – 2015-03-26 (×20): 20 mg via ORAL
  Filled 2015-03-16 (×22): qty 2

## 2015-03-16 MED ORDER — ZOLPIDEM TARTRATE 5 MG PO TABS: 5.0000 mg | ORAL_TABLET | Freq: Every evening | ORAL | Status: DC | PRN

## 2015-03-16 MED ORDER — ACETAMINOPHEN 325 MG PO TABS: 650.0000 mg | ORAL_TABLET | ORAL | Status: DC | PRN

## 2015-03-16 MED ORDER — IBUPROFEN 400 MG PO TABS: 600.0000 mg | ORAL_TABLET | Freq: Three times a day (TID) | ORAL | Status: DC | PRN

## 2015-03-16 MED ORDER — METHIMAZOLE 10 MG PO TABS
20.0000 mg | ORAL_TABLET | Freq: Two times a day (BID) | ORAL | Status: DC
  Administered 2015-03-16 (×2): 20 mg via ORAL
  Filled 2015-03-16 (×4): qty 2

## 2015-03-16 NOTE — ED Notes (Signed)
TTS completed. 

## 2015-03-16 NOTE — Progress Notes (Signed)
Patient ID: Grace [name], female   DOB: 02/12/1986, [age] y.o.   MRN: [id number] Patient admitted to observation for depression that had increased over last week. Patient reports decreased appetite, no sleep, crying spells, lack of pleasure in day to day, sadness after break up with boyfriend.  Patient Has no plan for suicide but reports she thinks about "just not being here". Patient has flat affect. Patient negative for alcohol, drugs, tobacco use.  Has 3 children she worries about. Vitals are stable. Calm and cooperative during admission. Patient given luncy. Under observation.
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
## 2015-03-16 NOTE — ED Notes (Signed)
Pt signed consent forms - [facility], [facility].

## 2015-03-16 NOTE — Progress Notes (Signed)
[facility] INPATIENT:  Family/Significant Other Suicide Prevention Education  Suicide Prevention Education:  Patient Refusal for Family/Significant Other Suicide Prevention Education: The patient Samai Christin BachM Sime has refused to provide written consent for family/significant other to be provided Family/Significant Other Suicide Prevention Education during admission and/or prior to discharge.  Physician notified.  Loren RacerMaggio, Shakerra Red J 03/16/2015, 12:33 PM

## 2015-03-16 NOTE — Plan of Care (Signed)
[facility] Observation Crisis Plan  Reason for Crisis Plan:  Crisis Stabilization   Plan of Care:  Referral for Inpatient Hospitalization  Family Support:      Current Living Environment:  Living Arrangements: Children, Other relatives  Insurance:   Hospital Account    Name Acct ID Class Status Primary Coverage   Grace [name] [id number] BEHAVIORAL HEALTH OBSERVATION [facility] [place] [address]        Guarantor Account (for Hospital Account [id number])    Name Relation to Pt Service Area Active? Acct Type   Grace [name] Self [facility] Yes Behavioral Health   Address Phone       [address] [place], [place] [address] [phone])          Coverage Information (for Hospital Account [id number])    F/O Payor/Plan Precert #   [facility] [address]    Subscriber Subscriber #   Grace [name] [id number]   Address Phone   [address] [place], [place] [address] [phone]      Legal Guardian:     Primary Care Provider:  [facility]  Current Outpatient Providers:  none  Psychiatrist:     Counselor/Therapist:     Compliant with Medications:  Yes  Additional Information:   Grace [name] 11/27/201612:34 PM

## 2015-03-16 NOTE — Clinical Social Work Note (Signed)
CSW met with pt individually to discuss aftercare options. She reports no prior psychiatric admissions or treatment. Pt stated that has been depressed for the past several weeks and is interested in beiing placed on anti depressant/anxiety medication. Pt was given [facility] [place] information and was educated on their services (medication management, individual/family counseling, and support groups). Pt stated that she plans to return home at d/c from [facility]. No substance abuse reported. Pt asked to be tested for STDs. Pt has been on phone/tearful. Pt reports that her children's' father is caring for her 3 children while she is at the hospital.

## 2015-03-16 NOTE — ED Provider Notes (Signed)
CSN: [id number]     Arrival date & time 03/15/15  2215 History   By signing my name below, I, Arlan Organ, attest that this documentation has been prepared under the direction and in the presence of Azalia Bilis, MD.  Electronically Signed: Arlan Organ, ED Scribe. 03/16/2015. 1:41 AM.   Chief Complaint  Patient presents with  . Depression   The history is provided by the patient. No language interpreter was used.    HPI Comments: Grace [name] is a 29 y.o. female who presents to the Emergency Department here for worsening depression this evening. Pt believes dperession may be triggered from her separation. Ongoing sleep disturbances as pt has not been able to fall asleep in last 3 days. She admits to suicidal ideation without a plan. No previous suicide attempts. No previous psychiatric evaluation or admissions. No underlying mental illnesses. PMHx includes hyperthyroidism and HTN.   PCP: [facility]    Past Medical History  Diagnosis Date  . Sickle cell trait (HCC)   . Hyperthyroidism   . Sickle cell trait (HCC)   . Cholecystitis 08/18/2011  . SVD (spontaneous vaginal delivery)     x 3 WH  . Hypertension   . History of recurrent UTIs     recurrent UTIs  . Headache(784.0)     otc med prn  . Joint pain     knees and shoulders - otc med prn   Past Surgical History  Procedure Laterality Date  . Cholecystectomy  08/19/2011    Procedure: LAPAROSCOPIC CHOLECYSTECTOMY WITH INTRAOPERATIVE CHOLANGIOGRAM;  Surgeon: Lodema Pilot, DO;  Location: [facility] OR;  Service: General;  Laterality: N/A;  . Dilation and curettage of uterus      molar preg  . Laparoscopic bilateral salpingectomy Bilateral 05/09/2013    Procedure: LAPAROSCOPIC BILATERAL SALPINGECTOMY;  Surgeon: Allie Bossier, MD;  Location: [facility] ORS;  Service: Gynecology;  Laterality: Bilateral;   Family History  Problem Relation Age of Onset  . Diabetes Mother   . Cancer Father   . Diabetes Maternal Uncle    . Cancer Maternal Grandmother   . Cancer Maternal Grandfather   . Hypertension Maternal Grandfather   . Diabetes Maternal Grandfather   . Sickle cell anemia Son   . Asthma Son   . Heart disease Paternal Grandfather   . Hearing loss Paternal Aunt    Social History  Substance Use Topics  . Smoking status: Never Smoker   . Smokeless tobacco: Never Used  . Alcohol Use: No   OB History    Gravida Para Term Preterm AB TAB SAB Ectopic Multiple Living   Review of Systems  A complete 10 system review of systems was obtained and all systems are negative except as noted in the HPI and PMH.    Allergies  Review of patient's allergies indicates no known allergies.  Home Medications   Prior to Admission medications   Medication Sig Start Date End Date Taking? Authorizing Provider  ibuprofen (ADVIL,MOTRIN) 200 MG tablet Take 400 mg by mouth every 6 (six) hours as needed for moderate pain.   Yes Historical Provider, MD  methimazole (TAPAZOLE) 10 MG tablet Take 2 tablets (20 mg total) by mouth 2 (two) times daily. 09/30/12  Yes Ozella Rocks, MD   Triage Vitals: BP 147/101 mmHg  Pulse 135  Temp(Src) 97.3 F (36.3 C) (Oral)  Resp 16  Ht  (  1.626 m)  Wt 151 lb (68.493 kg)  BMI 25.91 kg/m2  SpO2 98%  LMP 02/18/2015   Physical Exam  Constitutional: She is oriented to person, place, and time. She appears well-developed and well-nourished.  HENT:  Head: Normocephalic.  Eyes: EOM are normal.  Neck: Normal range of motion.  Pulmonary/Chest: Effort normal.  Abdominal: She exhibits no distension.  Musculoskeletal: Normal range of motion.  Neurological: She is alert and oriented to person, place, and time.  Psychiatric: She has a normal mood and affect.  Nursing note and vitals reviewed.   ED Course  Procedures (including critical care time)  DIAGNOSTIC STUDIES: Oxygen Saturation is 98% on RA, Normal by my interpretation.    COORDINATION OF CARE: 1:25  AM-Discussed treatment plan with pt at bedside and pt agreed to plan.     Labs Review Labs Reviewed  COMPREHENSIVE METABOLIC PANEL - Abnormal; Notable for the following:    AST 52 (*)    ALT 69 (*)    All other components within normal limits  ACETAMINOPHEN LEVEL - Abnormal; Notable for the following:    Acetaminophen (Tylenol), Serum <10 (*)    All other components within normal limits  ETHANOL  SALICYLATE LEVEL  CBC  URINE RAPID DRUG SCREEN, HOSP PERFORMED  POC URINE PREG, ED    Imaging Review No results found. I have personally reviewed and evaluated these images and lab results as part of my medical decision-making.   EKG Interpretation None      MDM   Final diagnoses:  Depression   Pt is medically clear at this time. Depression with vague SI. TTS to evaluate  I personally performed the services described in this documentation, which was scribed in my presence. The recorded information has been reviewed and is accurate.       Azalia BilisKevin Terald Jump, MD 03/16/15 [phone]

## 2015-03-16 NOTE — H&P (Signed)
[facility] Admission Assessment Adult  Patient Identification: Grace [name] MRN:  [id number] Date of Evaluation:  03/16/2015 Chief Complaint:  "I got more depressed after my separation a few weeks ago."  Principal Diagnosis: MDD (major depressive disorder), recurrent episode, moderate ([place]) Diagnosis:   Patient Active Problem List   Diagnosis Date Noted  . MDD (major depressive disorder), recurrent episode, moderate ([place]) [F33.1] 03/16/2015  . Consultation for female sterilization [Y86.57] 05/09/2013  . NSVD (normal spontaneous vaginal delivery) [O80] 03/30/2013  . Chronic hypertension in pregnancy [O10.919] 03/29/2013  . HTN in pregnancy, chronic [O10.919] 03/28/2013  . Transient hypertension of pregnancy, antepartum [O13.9] 03/20/2013  . GERD (gastroesophageal reflux disease) [K21.9] 01/18/2013  . Hyperemesis gravidarum before end of [redacted] week gestation with electrolyte imbalance [O21.1] 09/23/2012  . Trichomonas [A59.9] 09/23/2012  . Supervision of high-risk pregnancy [O09.90] 04/19/2011  . History of molar pregnancy, antepartum [O09.A0] 03/22/2011  . Hyperthyroidism, antepartum [Q46.962, E07.9] 03/16/2011  . Sickle cell trait ([place]) [D57.3] 12/30/2010   History of Present Illness::   Grace [name] is an 29 y.o. female, single, African-American who presents unaccompanied to [facility] ED reporting insomnia and symptoms of depression. Patient stated she has experienced depression in the past and her symptoms have worsened over the past week. She acknowledges symptoms including crying spells, social withdrawal, loss of interest in usual pleasures, anhedonia, decreased appetite, irritability and feelings of hopelessness. She states she has not slept in several days. She reports recent suicidal ideation with no specific plan, stating she has thoughts she doesn't want to live. Patient has denied any past attempts or any past psychiatric treatment. Patient stated during assessment  today "I usually am sad sometimes but since I broke up with my boyfriend it's really been bad. I had no idea that anything was wrong. I have not been eating or sleeping well. I had suicidal thoughts with no plan. But I am not having those today. I have my children to care for. I have a lot of stress with my son who has sickle cell. He is often in the hospital for several weeks at a time and I have lost jobs over it. Finances are another problem. I am open to taking some medication now but have never done so before. Normally I talk to my Aunt but family does not always understand. I have been hesitant before to talk with a strangers about my problems." Patient appears depressed throughout assessment but is able to fully participate. She reports prior problems with depressed mood but lately it has worsened since her boyfriend unexpectedly broke up with her. Patient is willing to be started on an antidepressant and outpatient therapy. She was given information to follow up with [facility] after discharge. Denies any substance abuse which is supported by negative urine drug screen and alcohol level. There is no evidence or report of psychosis during the psychiatric assessment.   Associated Signs/Symptoms: Depression Symptoms:  depressed mood, anhedonia, insomnia, psychomotor retardation, fatigue, feelings of worthlessness/guilt, difficulty concentrating, anxiety, loss of energy/fatigue, (Hypo) Manic Symptoms:  Irritable Mood, Labiality of Mood, Anxiety Symptoms:  Excessive Worry, Social Anxiety, Psychotic Symptoms:  Denies PTSD Symptoms: NA Total Time spent with patient: 45 minutes  Past Psychiatric History: MDD   Risk to Self: Is patient at risk for suicide?: No Risk to Others:   Prior Inpatient Therapy:   Prior Outpatient Therapy:    Alcohol Screening: 1. How often do you have a drink containing alcohol?: Never 9. Have you  or someone else been injured as a result of  your drinking?: No 10. Has a relative or friend or a doctor or another health worker been concerned about your drinking or suggested you cut down?: No Alcohol Use Disorder Identification Test Final Score (AUDIT): 0 Brief Intervention: AUDIT score less than 7 or less-screening does not suggest unhealthy drinking-brief intervention not indicated Substance Abuse History in the last 12 months:  No. Consequences of Substance Abuse: Negative Previous Psychotropic Medications: No  Psychological Evaluations: No  Past Medical History:  Past Medical History  Diagnosis Date  . Sickle cell trait ([place])   . Hyperthyroidism   . Sickle cell trait ([place])   . Cholecystitis 08/18/2011  . SVD (spontaneous vaginal delivery)     x 3 WH  . Hypertension   . History of recurrent UTIs     recurrent UTIs  . Headache(784.0)     otc med prn  . Joint pain     knees and shoulders - otc med prn    Past Surgical History  Procedure Laterality Date  . Cholecystectomy  08/19/2011    Procedure: LAPAROSCOPIC CHOLECYSTECTOMY WITH INTRAOPERATIVE CHOLANGIOGRAM;  Surgeon: Madilyn Hook, DO;  Location: [facility];  Service: General;  Laterality: N/A;  . Dilation and curettage of uterus      molar preg  . Laparoscopic bilateral salpingectomy Bilateral 05/09/2013    Procedure: LAPAROSCOPIC BILATERAL SALPINGECTOMY;  Surgeon: Emily Filbert, MD;  Location: [facility];  Service: Gynecology;  Laterality: Bilateral;   Family History:  Family History  Problem Relation Age of Onset  . Diabetes Mother   . Cancer Father   . Diabetes Maternal Uncle   . Cancer Maternal Grandmother   . Cancer Maternal Grandfather   . Hypertension Maternal Grandfather   . Diabetes Maternal Grandfather   . Sickle cell anemia Son   . Asthma Son   . Heart disease Paternal Grandfather   . Hearing loss Paternal Aunt    Family Psychiatric  History:  Social History:  History  Alcohol Use No     History  Drug Use No    Social History   Social History  .  Marital Status: Single    Spouse Name: N/A  . Number of Children: N/A  . Years of Education: N/A   Social History Main Topics  . Smoking status: Never Smoker   . Smokeless tobacco: Never Used  . Alcohol Use: No  . Drug Use: No  . Sexual Activity: Yes    Birth Control/ Protection: Surgical   Other Topics Concern  . None   Social History Narrative   2 Adults, 4 children at home   FOB involved   Additional Social History:    Pain Medications: Denies abuse Prescriptions: Denies abuse Over the Counter: Denies abuse History of alcohol / drug use?: No history of alcohol / drug abuse                    Allergies:  No Known Allergies Lab Results:  Results for orders placed or performed during the hospital encounter of 03/16/15 (from the past 48 hour(s))  Urine rapid drug screen (hosp performed) (Not at [facility])     Status: None   Collection Time: 03/15/15 10:48 PM  Result Value Ref Range   Opiates NONE DETECTED NONE DETECTED   Cocaine NONE DETECTED NONE DETECTED   Benzodiazepines NONE DETECTED NONE DETECTED   Amphetamines NONE DETECTED NONE DETECTED   Tetrahydrocannabinol NONE DETECTED NONE DETECTED  Barbiturates NONE DETECTED NONE DETECTED    Comment:        DRUG SCREEN FOR MEDICAL PURPOSES ONLY.  IF CONFIRMATION IS NEEDED FOR ANY PURPOSE, NOTIFY LAB WITHIN 5 DAYS.        LOWEST DETECTABLE LIMITS FOR URINE DRUG SCREEN Drug Class       Cutoff (ng/mL) Amphetamine      1000 Barbiturate      200 Benzodiazepine   919 Tricyclics       166 Opiates          300 Cocaine          300 THC              50   POC urine preg, ED (not at [facility])     Status: None   Collection Time: 03/15/15 10:57 PM  Result Value Ref Range   Preg Test, Ur NEGATIVE NEGATIVE    Comment:        THE SENSITIVITY OF THIS METHODOLOGY IS >24 mIU/mL   Comprehensive metabolic panel     Status: Abnormal   Collection Time: 03/15/15 11:32 PM  Result Value Ref Range   Sodium 139 135 - 145 mmol/L    Potassium 4.5 3.5 - 5.1 mmol/L   Chloride 106 101 - 111 mmol/L   CO2 24 22 - 32 mmol/L   Glucose, Bld 98 65 - 99 mg/dL   BUN 7 6 - 20 mg/dL   Creatinine, Ser 0.75 0.44 - 1.00 mg/dL   Calcium 9.6 8.9 - 10.3 mg/dL   Total Protein 7.7 6.5 - 8.1 g/dL   Albumin 4.3 3.5 - 5.0 g/dL   AST 52 (H) 15 - 41 U/L   ALT 69 (H) 14 - 54 U/L   Alkaline Phosphatase 38 38 - 126 U/L   Total Bilirubin 0.6 0.3 - 1.2 mg/dL   GFR calc non Af Amer >60 >60 mL/min   GFR calc Af Amer >60 >60 mL/min    Comment: (NOTE) The eGFR has been calculated using the CKD EPI equation. This calculation has not been validated in all clinical situations. eGFR's persistently <60 mL/min signify possible Chronic Kidney Disease.    Anion gap 9 5 - 15  Ethanol (ETOH)     Status: None   Collection Time: 03/15/15 11:32 PM  Result Value Ref Range   Alcohol, Ethyl (B) <5 <5 mg/dL    Comment:        LOWEST DETECTABLE LIMIT FOR SERUM ALCOHOL IS 5 mg/dL FOR MEDICAL PURPOSES ONLY   Salicylate level     Status: None   Collection Time: 03/15/15 11:32 PM  Result Value Ref Range   Salicylate Lvl <0.6 2.8 - 30.0 mg/dL  Acetaminophen level     Status: Abnormal   Collection Time: 03/15/15 11:32 PM  Result Value Ref Range   Acetaminophen (Tylenol), Serum <10 (L) 10 - 30 ug/mL    Comment:        THERAPEUTIC CONCENTRATIONS VARY SIGNIFICANTLY. A RANGE OF 10-30 ug/mL MAY BE AN EFFECTIVE CONCENTRATION FOR MANY PATIENTS. HOWEVER, SOME ARE BEST TREATED AT CONCENTRATIONS OUTSIDE THIS RANGE. ACETAMINOPHEN CONCENTRATIONS >150 ug/mL AT 4 HOURS AFTER INGESTION AND >50 ug/mL AT 12 HOURS AFTER INGESTION ARE OFTEN ASSOCIATED WITH TOXIC REACTIONS.   CBC     Status: None   Collection Time: 03/15/15 11:32 PM  Result Value Ref Range   WBC 10.4 4.0 - 10.5 K/uL   RBC 4.26 3.87 - 5.11 MIL/uL   Hemoglobin 13.1 12.0 -  15.0 g/dL   HCT 37.7 36.0 - 46.0 %   MCV 88.5 78.0 - 100.0 fL   MCH 30.8 26.0 - 34.0 pg   MCHC 34.7 30.0 - 36.0 g/dL   RDW  13.3 11.5 - 15.5 %   Platelets 368 150 - 751 K/uL    Metabolic Disorder Labs:  No results found for: HGBA1C, MPG No results found for: PROLACTIN No results found for: CHOL, TRIG, HDL, CHOLHDL, VLDL, LDLCALC  Current Medications: Current Facility-Administered Medications  Medication Dose Route Frequency Provider Last Rate Last Dose  . acetaminophen (TYLENOL) tablet 650 mg  650 mg Oral Q4H PRN Niel Hummer, NP      . alum & mag hydroxide-simeth (MAALOX/MYLANTA) 200-200-20 MG/5ML suspension 30 mL  30 mL Oral PRN Niel Hummer, NP      . ibuprofen (ADVIL,MOTRIN) tablet 600 mg  600 mg Oral Q8H PRN Niel Hummer, NP   600 mg at 03/16/15 1320  . LORazepam (ATIVAN) tablet 1 mg  1 mg Oral Q8H PRN Niel Hummer, NP   1 mg at 03/16/15 1320  . methimazole (TAPAZOLE) tablet 20 mg  20 mg Oral BID Niel Hummer, NP      . ondansetron [facility]) tablet 4 mg  4 mg Oral Q8H PRN Niel Hummer, NP      . sertraline (ZOLOFT) tablet 50 mg  50 mg Oral Daily Niel Hummer, NP      . traZODone (DESYREL) tablet 50 mg  50 mg Oral QHS PRN Niel Hummer, NP       PTA Medications: Prescriptions prior to admission  Medication Sig Dispense Refill Last Dose  . ibuprofen (ADVIL,MOTRIN) 200 MG tablet Take 400 mg by mouth every 6 (six) hours as needed for moderate pain.   last month  . methimazole (TAPAZOLE) 10 MG tablet Take 2 tablets (20 mg total) by mouth 2 (two) times daily. 120 tablet 3 03/15/2015 at Unknown time    Musculoskeletal: Strength & Muscle Tone: within normal limits Gait & Station: normal Patient leans: N/A  Psychiatric Specialty Exam: Physical Exam  Review of Systems  Constitutional: Negative.   HENT: Negative.   Eyes: Negative.   Respiratory: Negative.   Cardiovascular: Negative.   Gastrointestinal: Negative.   Genitourinary: Negative.   Musculoskeletal: Negative.   Skin: Negative.   Neurological: Negative.   Endo/Heme/Allergies: Negative.   Psychiatric/Behavioral: Positive for  depression. Negative for suicidal ideas, hallucinations, memory loss and substance abuse. The patient is nervous/anxious and has insomnia.     Pulse 105, temperature 98.3 F (36.8 C), temperature source Oral, resp. rate 16, height _0  (1.626 m), weight 68.493 kg (151 lb), last menstrual period 02/18/2015, SpO2 100 %, not currently breastfeeding.Body mass index is 25.91 kg/(m^2).  General Appearance: Casual  Eye Contact::  Good  Speech:  Clear and Coherent  Volume:  Decreased  Mood:  Depressed  Affect:  Constricted  Thought Process:  Goal Directed and Intact  Orientation:  Full (Time, Place, and Person)  Thought Content:  Symptoms, worries, concerns  Suicidal Thoughts:  No  Homicidal Thoughts:  No  Memory:  Immediate;   Good Recent;   Good Remote;   Good  Judgement:  Fair  Insight:  Present  Psychomotor Activity:  Decreased  Concentration:  Good  Recall:  Good  Fund of Knowledge:Good  Language: Good  Akathisia:  No  Handed:  Right  AIMS (if indicated):     Assets:  Communication Skills Desire for Improvement  Leisure Time Physical Health Resilience Social Support  ADL's:  Intact  Cognition: WNL  Sleep:        Treatment Plan Summary: Daily contact with patient to assess and evaluate symptoms and progress in treatment and Medication management  -Admit to the [facility] to be started on antidepressant and referred for outpatient services. -Will reevaluate in the morning with likely discharge 03/17/2015  Observation Level/Precautions:  Continuous Observation  Laboratory:  CBC Chemistry Profile UDS  Psychotherapy:  Individual therapy  Medications:  Start Zoloft 50 mg daily for depression, Trazodone 50 mg hs prn insomnia   Consultations:  As needed  Discharge Concerns:  None at this time   Estimated LOS: Less than 48 hours  Other:     Elmarie Shiley, NP-C 11/27/20163:44 PM I agree with assessment and plan Geralyn Flash A. Sabra Heck, M.D.

## 2015-03-16 NOTE — Clinical Social Work Note (Signed)
Pt asked to speak with CSW privately. She stated that she was having thoughts of SI "If I walk out of here, I feel like I'm gonna do something stupid like walk into traffic." Pt tearful, stating that "I'm in my head too much." She reports racing thoughts. CSW provided emotional encouragement. Pt stated that she has passive SI but is able to contract for safety. She stated that she would tell CSW or RN if she had thoughts of hurting herself. Pt acknowledged that she needs to focus on her mental health in order to take care of her children and other responsibilities.   Trula SladeHeather Smart, MSW, LCSW Clinical Social Worker 03/16/2015 6:47 PM

## 2015-03-17 DIAGNOSIS — Z822 Family history of deafness and hearing loss: Secondary | ICD-10-CM | POA: Diagnosis not present

## 2015-03-17 DIAGNOSIS — F331 Major depressive disorder, recurrent, moderate: Secondary | ICD-10-CM | POA: Diagnosis present

## 2015-03-17 DIAGNOSIS — Z833 Family history of diabetes mellitus: Secondary | ICD-10-CM | POA: Diagnosis not present

## 2015-03-17 DIAGNOSIS — E0590 Thyrotoxicosis, unspecified without thyrotoxic crisis or storm: Secondary | ICD-10-CM | POA: Diagnosis present

## 2015-03-17 DIAGNOSIS — I10 Essential (primary) hypertension: Secondary | ICD-10-CM | POA: Diagnosis present

## 2015-03-17 DIAGNOSIS — R002 Palpitations: Secondary | ICD-10-CM | POA: Diagnosis not present

## 2015-03-17 DIAGNOSIS — F419 Anxiety disorder, unspecified: Secondary | ICD-10-CM | POA: Diagnosis present

## 2015-03-17 DIAGNOSIS — Z825 Family history of asthma and other chronic lower respiratory diseases: Secondary | ICD-10-CM | POA: Diagnosis not present

## 2015-03-17 DIAGNOSIS — R45851 Suicidal ideations: Secondary | ICD-10-CM | POA: Diagnosis present

## 2015-03-17 DIAGNOSIS — K219 Gastro-esophageal reflux disease without esophagitis: Secondary | ICD-10-CM | POA: Diagnosis present

## 2015-03-17 DIAGNOSIS — Z8249 Family history of ischemic heart disease and other diseases of the circulatory system: Secondary | ICD-10-CM | POA: Diagnosis not present

## 2015-03-17 DIAGNOSIS — F4001 Agoraphobia with panic disorder: Secondary | ICD-10-CM | POA: Diagnosis present

## 2015-03-17 DIAGNOSIS — R0789 Other chest pain: Secondary | ICD-10-CM | POA: Diagnosis not present

## 2015-03-17 DIAGNOSIS — Z23 Encounter for immunization: Secondary | ICD-10-CM | POA: Diagnosis not present

## 2015-03-17 DIAGNOSIS — D573 Sickle-cell trait: Secondary | ICD-10-CM | POA: Diagnosis present

## 2015-03-17 MED ORDER — HYDROXYZINE HCL 25 MG PO TABS
25.0000 mg | ORAL_TABLET | Freq: Four times a day (QID) | ORAL | Status: DC | PRN
Start: 2015-03-17 — End: 2015-03-26
  Administered 2015-03-17 – 2015-03-24 (×8): 25 mg via ORAL
  Filled 2015-03-17 (×7): qty 1

## 2015-03-17 NOTE — Progress Notes (Signed)
Patient ID: Grace [name], female   DOB: 09/07/1985, 29 y.o.   MRN: [id number] Sought writer out to request something for her anxiety. She had spoken with the NP just a bit earlier. States she knows she will be transferred to the adult unit when a bed becomes available. Gave her an Ativan as ordered. Will continue to monitor.

## 2015-03-17 NOTE — Progress Notes (Signed)
[facility] Progress Note  03/17/2015 3:09 PM [place]  MRN:  [id number] Subjective:   Patient states "I feel very anxious today. I started thinking more about suicide overnight. I'm worried I will walk into traffic or cut myself. The thoughts are pretty persistent. I have really bad anxiety. I am just tired of life right now. I got upset last night after talking to my boyfriend. He told me that he cheated on me and then I had sex with him. I am scared now that I could have a sexually transmitted disease. I want to be tested to ease my mind."   Objective:   Patient is seen and chart is reviewed. The patient presented for help with depression and suicidal thoughts. Grace [name] reported symptoms of depression previously but recently have worsened since her boyfriend suddenly broke up with her. She just learned last night that the reason was that he was seeing another woman "We were together over a year and I trusted him." She is reporting increased symptoms of anxiety triggered by her fears of potentially getting an sexually transmitted infection from him. Patient is meeting criteria for admission to the adult unit at this time as she is not able to contract for safety. Her suicidal thoughts do not appear to be related to be started on an SSRI yesterday (has only received two doses) but rather from psychosocial stressors. Will order testing to evaluate for STI's.   Principal Problem: MDD (major depressive disorder), recurrent episode, moderate ([place]) Diagnosis:   Patient Active Problem List   Diagnosis Date Noted  . MDD (major depressive disorder), recurrent episode, moderate ([place]) [F33.1] 03/16/2015  . Consultation for female sterilization [Y85.02] 05/09/2013  . NSVD (normal spontaneous vaginal delivery) [O80] 03/30/2013  . Chronic hypertension in pregnancy [O10.919] 03/29/2013  . HTN in pregnancy, chronic [O10.919] 03/28/2013  . Transient hypertension of pregnancy, antepartum  [O13.9] 03/20/2013  . GERD (gastroesophageal reflux disease) [K21.9] 01/18/2013  . Hyperemesis gravidarum before end of [redacted] week gestation with electrolyte imbalance [O21.1] 09/23/2012  . Trichomonas [A59.9] 09/23/2012  . Supervision of high-risk pregnancy [O09.90] 04/19/2011  . History of molar pregnancy, antepartum [O09.A0] 03/22/2011  . Hyperthyroidism, antepartum [D74.128, E07.9] 03/16/2011  . Sickle cell trait ([place]) [D57.3] 12/30/2010   Total Time spent with patient: 30 minutes  Past Psychiatric History: Denies  Past Medical History:  Past Medical History  Diagnosis Date  . Sickle cell trait ([place])   . Hyperthyroidism   . Sickle cell trait ([place])   . Cholecystitis 08/18/2011  . SVD (spontaneous vaginal delivery)     x 3 WH  . Hypertension   . History of recurrent UTIs     recurrent UTIs  . Headache(784.0)     otc med prn  . Joint pain     knees and shoulders - otc med prn    Past Surgical History  Procedure Laterality Date  . Cholecystectomy  08/19/2011    Procedure: LAPAROSCOPIC CHOLECYSTECTOMY WITH INTRAOPERATIVE CHOLANGIOGRAM;  Surgeon: Madilyn Hook, DO;  Location: [facility];  Service: General;  Laterality: N/A;  . Dilation and curettage of uterus      molar preg  . Laparoscopic bilateral salpingectomy Bilateral 05/09/2013    Procedure: LAPAROSCOPIC BILATERAL SALPINGECTOMY;  Surgeon: Emily Filbert, MD;  Location: [facility];  Service: Gynecology;  Laterality: Bilateral;   Family History:  Family History  Problem Relation Age of Onset  . Diabetes Mother   . Cancer Father   . Diabetes Maternal Uncle   .  Cancer Maternal Grandmother   . Cancer Maternal Grandfather   . Hypertension Maternal Grandfather   . Diabetes Maternal Grandfather   . Sickle cell anemia Son   . Asthma Son   . Heart disease Paternal Grandfather   . Hearing loss Paternal Aunt    Social History:  History  Alcohol Use No     History  Drug Use No    Social History   Social History  . Marital Status:  Single    Spouse Name: N/A  . Number of Children: N/A  . Years of Education: N/A   Social History Main Topics  . Smoking status: Never Smoker   . Smokeless tobacco: Never Used  . Alcohol Use: No  . Drug Use: No  . Sexual Activity: Yes    Birth Control/ Protection: Surgical   Other Topics Concern  . None   Social History Narrative   2 Adults, 4 children at home   FOB involved   Additional Social History:    Pain Medications: Denies abuse Prescriptions: Denies abuse Over the Counter: Denies abuse History of alcohol / drug use?: No history of alcohol / drug abuse                    Sleep: Poor  Appetite:  Fair  Current Medications: Current Facility-Administered Medications  Medication Dose Route Frequency Provider Last Rate Last Dose  . acetaminophen (TYLENOL) tablet 650 mg  650 mg Oral Q4H PRN Niel Hummer, NP      . alum & mag hydroxide-simeth (MAALOX/MYLANTA) 200-200-20 MG/5ML suspension 30 mL  30 mL Oral PRN Niel Hummer, NP      . hydrOXYzine (ATARAX/VISTARIL) tablet 25 mg  25 mg Oral Q6H PRN Niel Hummer, NP      . ibuprofen (ADVIL,MOTRIN) tablet 600 mg  600 mg Oral Q8H PRN Niel Hummer, NP   600 mg at 03/16/15 1320  . LORazepam (ATIVAN) tablet 1 mg  1 mg Oral Q8H PRN Niel Hummer, NP   1 mg at 03/17/15 1038  . methimazole (TAPAZOLE) tablet 20 mg  20 mg Oral BID Niel Hummer, NP   20 mg at 03/17/15 0948  . ondansetron (ZOFRAN) tablet 4 mg  4 mg Oral Q8H PRN Niel Hummer, NP      . sertraline (ZOLOFT) tablet 50 mg  50 mg Oral Daily Niel Hummer, NP   50 mg at 03/17/15 0806  . traZODone (DESYREL) tablet 50 mg  50 mg Oral QHS PRN Niel Hummer, NP        Lab Results:  Results for orders placed or performed during the hospital encounter of 03/16/15 (from the past 48 hour(s))  Urine rapid drug screen (hosp performed) (Not at [facility])     Status: None   Collection Time: 03/15/15 10:48 PM  Result Value Ref Range   Opiates NONE DETECTED NONE DETECTED    Cocaine NONE DETECTED NONE DETECTED   Benzodiazepines NONE DETECTED NONE DETECTED   Amphetamines NONE DETECTED NONE DETECTED   Tetrahydrocannabinol NONE DETECTED NONE DETECTED   Barbiturates NONE DETECTED NONE DETECTED    Comment:        DRUG SCREEN FOR MEDICAL PURPOSES ONLY.  IF CONFIRMATION IS NEEDED FOR ANY PURPOSE, NOTIFY LAB WITHIN 5 DAYS.        LOWEST DETECTABLE LIMITS FOR URINE DRUG SCREEN Drug Class       Cutoff (ng/mL) Amphetamine      1000 Barbiturate  200 Benzodiazepine   762 Tricyclics       263 Opiates          300 Cocaine          300 THC              50   POC urine preg, ED (not at [facility])     Status: None   Collection Time: 03/15/15 10:57 PM  Result Value Ref Range   Preg Test, Ur NEGATIVE NEGATIVE    Comment:        THE SENSITIVITY OF THIS METHODOLOGY IS >24 mIU/mL   Comprehensive metabolic panel     Status: Abnormal   Collection Time: 03/15/15 11:32 PM  Result Value Ref Range   Sodium 139 135 - 145 mmol/L   Potassium 4.5 3.5 - 5.1 mmol/L   Chloride 106 101 - 111 mmol/L   CO2 24 22 - 32 mmol/L   Glucose, Bld 98 65 - 99 mg/dL   BUN 7 6 - 20 mg/dL   Creatinine, Ser 0.75 0.44 - 1.00 mg/dL   Calcium 9.6 8.9 - 10.3 mg/dL   Total Protein 7.7 6.5 - 8.1 g/dL   Albumin 4.3 3.5 - 5.0 g/dL   AST 52 (H) 15 - 41 U/L   ALT 69 (H) 14 - 54 U/L   Alkaline Phosphatase 38 38 - 126 U/L   Total Bilirubin 0.6 0.3 - 1.2 mg/dL   GFR calc non Af Amer >60 >60 mL/min   GFR calc Af Amer >60 >60 mL/min    Comment: (NOTE) The eGFR has been calculated using the CKD EPI equation. This calculation has not been validated in all clinical situations. eGFR's persistently <60 mL/min signify possible Chronic Kidney Disease.    Anion gap 9 5 - 15  Ethanol (ETOH)     Status: None   Collection Time: 03/15/15 11:32 PM  Result Value Ref Range   Alcohol, Ethyl (B) <5 <5 mg/dL    Comment:        LOWEST DETECTABLE LIMIT FOR SERUM ALCOHOL IS 5 mg/dL FOR MEDICAL PURPOSES ONLY    Salicylate level     Status: None   Collection Time: 03/15/15 11:32 PM  Result Value Ref Range   Salicylate Lvl <3.3 2.8 - 30.0 mg/dL  Acetaminophen level     Status: Abnormal   Collection Time: 03/15/15 11:32 PM  Result Value Ref Range   Acetaminophen (Tylenol), Serum <10 (L) 10 - 30 ug/mL    Comment:        THERAPEUTIC CONCENTRATIONS VARY SIGNIFICANTLY. A RANGE OF 10-30 ug/mL MAY BE AN EFFECTIVE CONCENTRATION FOR MANY PATIENTS. HOWEVER, SOME ARE BEST TREATED AT CONCENTRATIONS OUTSIDE THIS RANGE. ACETAMINOPHEN CONCENTRATIONS >150 ug/mL AT 4 HOURS AFTER INGESTION AND >50 ug/mL AT 12 HOURS AFTER INGESTION ARE OFTEN ASSOCIATED WITH TOXIC REACTIONS.   CBC     Status: None   Collection Time: 03/15/15 11:32 PM  Result Value Ref Range   WBC 10.4 4.0 - 10.5 K/uL   RBC 4.26 3.87 - 5.11 MIL/uL   Hemoglobin 13.1 12.0 - 15.0 g/dL   HCT 37.7 36.0 - 46.0 %   MCV 88.5 78.0 - 100.0 fL   MCH 30.8 26.0 - 34.0 pg   MCHC 34.7 30.0 - 36.0 g/dL   RDW 13.3 11.5 - 15.5 %   Platelets 368 150 - 400 K/uL    Physical Findings: AIMS: Facial and Oral Movements Muscles of Facial Expression: None, normal Lips and Perioral Area: None, normal  Jaw: None, normal Tongue: None, normal,Extremity Movements Upper (arms, wrists, hands, fingers): None, normal Lower (legs, knees, ankles, toes): None, normal, Trunk Movements Neck, shoulders, hips: None, normal, Overall Severity Severity of abnormal movements (highest score from questions above): None, normal Incapacitation due to abnormal movements: None, normal Patient's awareness of abnormal movements (rate only patient's report): No Awareness, Dental Status Current problems with teeth and/or dentures?: No Does patient usually wear dentures?: No  CIWA:    COWS:     Musculoskeletal: Strength & Muscle Tone: within normal limits Gait & Station: normal Patient leans: N/A  Psychiatric Specialty Exam: Review of Systems  Constitutional: Negative.    HENT: Negative.   Eyes: Negative.   Respiratory: Negative.   Cardiovascular: Negative.   Gastrointestinal: Negative.   Genitourinary: Negative.   Musculoskeletal: Negative.   Skin: Negative.   Neurological: Negative.   Endo/Heme/Allergies: Negative.   Psychiatric/Behavioral: Positive for depression and suicidal ideas. Negative for hallucinations, memory loss and substance abuse. The patient is nervous/anxious and has insomnia.     Blood pressure 109/65, pulse 92, temperature 98.3 F (36.8 C), temperature source Oral, resp. rate 18, height _0  (1.626 m), weight 68.493 kg (151 lb), last menstrual period 02/18/2015, SpO2 100 %, not currently breastfeeding.Body mass index is 25.91 kg/(m^2).  General Appearance: Casual  Eye Contact::  Fair  Speech:  Clear and Coherent  Volume:  Decreased  Mood:  Anxious  Affect:  Depressed  Thought Process:  Goal Directed and Intact  Orientation:  Full (Time, Place, and Person)  Thought Content:  Symptoms, worries   Suicidal Thoughts:  Yes.  with intent/plan  Homicidal Thoughts:  No  Memory:  Immediate;   Good Recent;   Good Remote;   Good  Judgement:  Fair  Insight:  Present  Psychomotor Activity:  Normal  Concentration:  Good  Recall:  Good  Fund of Knowledge:Good  Language: Good  Akathisia:  No  Handed:  Right  AIMS (if indicated):     Assets:  Communication Skills Desire for Improvement Leisure Time Physical Health Resilience Social Support  ADL's:  Intact  Cognition: WNL  Sleep:      Treatment Plan Summary: Daily contact with patient to assess and evaluate symptoms and progress in treatment and Medication management  -Admit to the [facility] Adult 400 hall unit due to depressive symptoms and suicidal ideation with a plan  -Continue Zoloft 50 mg daily for depression -Continue Ativan 1 mg every eight hours prn anxiety -Ordered chlamydia/gonnorrhea urine test, HIV/RPR blood test this evening   Catcher Dehoyos, NP-C 03/17/2015, 3:09  PM

## 2015-03-17 NOTE — Progress Notes (Signed)
Patient ID: Grace [name], female   DOB: 04/17/1986, 29 y.o.   MRN: [id number] D-On awakening this am medicine gave her medication and assessed her status and plans for her once time is up here in OBS. She states she is depressed and endorsing suicidal plan to either cut self or run out into traffic. States she doesn't feel like herself. She does have numerous stressors, relationship difficulty and three young kids, one with special needs and she is unemployed. A-Support offered monitored for safety and medications as ordered. R-She would like to be admitted and treated for depression and suicidal ideation. Is currently taking Zoloft that was started here.

## 2015-03-17 NOTE — Tx Team (Signed)
Initial Interdisciplinary Treatment Plan   PATIENT STRESSORS: Financial difficulties Marital or family conflict   PATIENT STRENGTHS: Ability for insight Capable of independent living Motivation for treatment/growth   PROBLEM LIST: Problem List/Patient Goals Date to be addressed Date deferred Reason deferred Estimated date of resolution  "help with depression" 03/17/15     "suicidal thoughts" 03/17/15                                                DISCHARGE CRITERIA:  Ability to meet basic life and health needs Adequate post-discharge living arrangements Improved stabilization in mood, thinking, and/or behavior  PRELIMINARY DISCHARGE PLAN: Attend aftercare/continuing care group Attend PHP/IOP  PATIENT/FAMIILY INVOLVEMENT: This treatment plan has been presented to and reviewed with the patient, Caprice RedChaquita M Patchen, and/or family member.  The patient and family have been given the opportunity to ask questions and make suggestions.  Genisis Sonnier L 03/17/2015, 5:49 PM

## 2015-03-17 NOTE — Progress Notes (Signed)
Admission note: Pt transferred from Obs to the adult unit. Pt endorses passive suicidal thoughts. Pt denies suicidal thoughts at time of admit. Pt verbally contracts not to harm self. Pt reports increased depression after a break-up with her boyfriend who now have a new girlfriend. Pt asked writer about being screened for STD's. Writer provided pt with specimen cup and sample was collected this evening. Pt calm and cooperative at this time.

## 2015-03-17 NOTE — Progress Notes (Signed)
Patient ID: Grace [name], female   DOB: 12/27/1985, 29 y.o.   MRN: [id number] Feeling better after taking Ativan for anxiety. She is up and watching TV and reading magazines waiting on transfer to the adult unit due to her continued endorsement of suicidal ideation with a plan.

## 2015-03-17 NOTE — Progress Notes (Signed)
Adult Psychoeducational Group Note  Date:  03/17/2015 Time:  9:37 PM  Group Topic/Focus:  Wrap-Up Group:   The focus of this group is to help patients review their daily goal of treatment and discuss progress on daily workbooks.  Participation Level:  Active  Participation Quality:  Appropriate and Attentive  Affect:  Appropriate  Cognitive:  Appropriate  Insight: Appropriate and Good  Engagement in Group:  Engaged  Modes of Intervention:  Education  Additional Comments:  Pt stated she did not have a good day. Pt goal for tomorrow is to feel better and not be depress.   Merlinda FrederickKeshia S Rykin Route 03/17/2015, 9:37 PM

## 2015-03-18 ENCOUNTER — Encounter (HOSPITAL_COMMUNITY): Payer: Self-pay | Admitting: Psychiatry

## 2015-03-18 DIAGNOSIS — R45851 Suicidal ideations: Secondary | ICD-10-CM

## 2015-03-18 LAB — RPR: RPR Ser Ql: NONREACTIVE

## 2015-03-18 LAB — HIV ANTIBODY (ROUTINE TESTING W REFLEX): HIV Screen 4th Generation wRfx: NONREACTIVE

## 2015-03-18 MED ORDER — TRAZODONE HCL 100 MG PO TABS
100.0000 mg | ORAL_TABLET | Freq: Every evening | ORAL | Status: DC | PRN
  Administered 2015-03-18 – 2015-03-20 (×3): 100 mg via ORAL
  Filled 2015-03-18 (×3): qty 1

## 2015-03-18 NOTE — Progress Notes (Signed)
Recreation Therapy Notes  Animal-Assisted Activity (AAA) Program Checklist/Progress Notes Patient Eligibility Criteria Checklist & Daily Group note for Rec Tx Intervention  Date: 11.29.2016 Time: 2:45pm Location: [address] Dayroom    AAA/T Program Assumption of Risk Form signed by Patient/ or Parent Legal Guardian yes  Patient is free of allergies or sever asthma yes  Patient reports no fear of animals yes  Patient reports no history of cruelty to animals yes  Patient understands his/her participation is voluntary yes  Patient washes hands before animal contact yes  Patient washes hands after animal contact yes  Behavioral Response: Appropriate   Education: Hand Washing, Appropriate Animal Interaction   Education Outcome: Acknowledges education.   Clinical Observations/Feedback: Patient engaged appropriately with therapy dog and peers in session.    Marykay Lexenise L Fryda Molenda, LRT/CTRS  Jearl KlinefelterBlanchfield, Tripton Ned L 03/18/2015 3:16 PM

## 2015-03-18 NOTE — H&P (Addendum)
Psychiatric Admission Assessment Adult  Patient Identification: Grace [name] MRN:  [id number] Date of Evaluation:  03/18/2015 Chief Complaint:   " I have been more depressed " Principal Diagnosis: MDD (major depressive disorder), recurrent episode, moderate (HCC) Diagnosis:   Patient Active Problem List   Diagnosis Date Noted  . MDD (major depressive disorder), recurrent episode, moderate (HCC) [F33.1] 03/16/2015  . Consultation for female sterilization [Z30.09] 05/09/2013  . NSVD (normal spontaneous vaginal delivery) [O80] 03/30/2013  . Chronic hypertension in pregnancy [O10.919] 03/29/2013  . HTN in pregnancy, chronic [O10.919] 03/28/2013  . Transient hypertension of pregnancy, antepartum [O13.9] 03/20/2013  . GERD (gastroesophageal reflux disease) [K21.9] 01/18/2013  . Hyperemesis gravidarum before end of [redacted] week gestation with electrolyte imbalance [O21.1] 09/23/2012  . Trichomonas [A59.9] 09/23/2012  . Supervision of high-risk pregnancy [O09.90] 04/19/2011  . History of molar pregnancy, antepartum [O09.A0] 03/22/2011  . Hyperthyroidism, antepartum [N82.956[O99.280, E07.9] 03/16/2011  . Sickle cell trait (HCC) [D57.3] 12/30/2010   History of Present Illness:: 29 year old female, states she has been dealing with depression for " a long time on and off ". States that over the last 2 weeks or so her depression has been worsening, with increased sadness, neuro- vegetative symptoms as listed below, and has developed suicidal ideations, such as fantasizing about cutting self or walking into traffic . States she has been more " stressed " recently. Reports she had a relationship break up with her boyfriend in early November ( and also found out that he had been being unfaithful to her )  , and also reports that her father passed away suddenly in 2011, and that last Thursday was his birthday. Patient states she has continued to grieve his death, and that she feels more depressed around his  birthday. Associated Signs/Symptoms: Depression Symptoms:  depressed mood, anhedonia, insomnia, suicidal thoughts with specific plan, loss of energy/fatigue, decreased appetite, decreased sense of self esteem (Hypo) Manic Symptoms:  Does not endorse  Anxiety Symptoms:  States she has been worrying excessively , some panic attacks, mild degree of agoraphobia endorsed  Psychotic Symptoms:   Does not endorse  PTSD Symptoms: She states she was present when her father died in 2011, and she still has nightmares and intrusive recollections about this experience  Total Time spent with patient: 45 minutes  Past Psychiatric History:  No prior psychiatric admissions,  States she has had recurrent episodes of depression, usually relatively mild, but states that recently it has been more severe . She does not endorse any clear history of mania, hypomania, denies history of suicide attempts, denies history of self cutting, states she tends to " throw stuff " when she is angry but denies violence towards people. Denies history of psychosis.  States she has never been on psychiatric medications.  Risk to Self: Is patient at risk for suicide?: No What has been your use of drugs/alcohol within the last 12 months?: Pt denies Risk to Others:   Prior Inpatient Therapy:   Prior Outpatient Therapy:    Alcohol Screening: 1. How often do you have a drink containing alcohol?: Never 9. Have you or someone else been injured as a result of your drinking?: No 10. Has a relative or friend or a doctor or another health worker been concerned about your drinking or suggested you cut down?: No Alcohol Use Disorder Identification Test Final Score (AUDIT): 0 Brief Intervention: AUDIT score less than 7 or less-screening does not suggest unhealthy drinking-brief intervention not indicated Substance Abuse History in  the last 12 months: denies any alcohol or drug abuse  Consequences of Substance Abuse: Denies  Previous  Psychotropic Medications: states she has never been on any psychiatric medication Psychological Evaluations:  No  Past Medical History: NKDA, does not smoke   Past Medical History  Diagnosis Date  . Sickle cell trait (HCC)   . Hyperthyroidism   . Sickle cell trait (HCC)   . Cholecystitis 08/18/2011  . SVD (spontaneous vaginal delivery)     x 3 [facility]  . Hypertension   . History of recurrent UTIs     recurrent UTIs  . Headache(784.0)     otc med prn  . Joint pain     knees and shoulders - otc med prn    Past Surgical History  Procedure Laterality Date  . Cholecystectomy  08/19/2011    Procedure: LAPAROSCOPIC CHOLECYSTECTOMY WITH INTRAOPERATIVE CHOLANGIOGRAM;  Surgeon: Lodema Pilot, DO;  Location: [facility] OR;  Service: General;  Laterality: N/A;  . Dilation and curettage of uterus      molar preg  . Laparoscopic bilateral salpingectomy Bilateral 05/09/2013    Procedure: LAPAROSCOPIC BILATERAL SALPINGECTOMY;  Surgeon: Allie Bossier, MD;  Location: [facility];  Service: Gynecology;  Laterality: Bilateral;   Family History:  Father passed away from cancer in Sep 07, 2009, mother alive, has one brother and one sister . Family History  Problem Relation Age of Onset  . Diabetes Mother   . Cancer Father   . Diabetes Maternal Uncle   . Cancer Maternal Grandmother   . Cancer Maternal Grandfather   . Hypertension Maternal Grandfather   . Diabetes Maternal Grandfather   . Sickle cell anemia Son   . Asthma Son   . Heart disease Paternal Grandfather   . Hearing loss Paternal Aunt    Family Psychiatric  History: states she has an aunt who suffers from bipolar disorder and anxiety, no suicides in family, no drug abuse/ alcohol abuse in family Social History: she is currently living with her children , ages 35,3, 1 . Currently they are with their father. Currently unemployed, and currently has no source of income. Denies legal issues. Recent break up with BF in early November . History  Alcohol Use No     History   Drug Use No    Social History   Social History  . Marital Status: Single    Spouse Name: N/A  . Number of Children: N/A  . Years of Education: N/A   Social History Main Topics  . Smoking status: Never Smoker   . Smokeless tobacco: Never Used  . Alcohol Use: No  . Drug Use: No  . Sexual Activity: Yes    Birth Control/ Protection: Surgical   Other Topics Concern  . None   Social History Narrative   2 Adults, 4 children at home   FOB involved   Additional Social History:    Pain Medications: Denies abuse Prescriptions: Denies abuse Over the Counter: Denies abuse History of alcohol / drug use?: No history of alcohol / drug abuse  Allergies:  No Known Allergies Lab Results: No results found for this or any previous visit (from the past 48 hour(s)).  Metabolic Disorder Labs:  No results found for: HGBA1C, MPG No results found for: PROLACTIN No results found for: CHOL, TRIG, HDL, CHOLHDL, VLDL, LDLCALC  Current Medications: Current Facility-Administered Medications  Medication Dose Route Frequency Provider Last Rate Last Dose  . acetaminophen (TYLENOL) tablet 650 mg  650 mg Oral Q4H PRN Thermon Leyland,  NP      . alum & mag hydroxide-simeth (MAALOX/MYLANTA) 200-200-20 MG/5ML suspension 30 mL  30 mL Oral PRN Thermon Leyland, NP      . hydrOXYzine (ATARAX/VISTARIL) tablet 25 mg  25 mg Oral Q6H PRN Thermon Leyland, NP   25 mg at 03/17/15 2307  . ibuprofen (ADVIL,MOTRIN) tablet 600 mg  600 mg Oral Q8H PRN Thermon Leyland, NP   600 mg at 03/16/15 1320  . LORazepam (ATIVAN) tablet 1 mg  1 mg Oral Q8H PRN Thermon Leyland, NP   1 mg at 03/18/15 1610  . methimazole (TAPAZOLE) tablet 20 mg  20 mg Oral BID Thermon Leyland, NP   20 mg at 03/18/15 9604  . ondansetron (ZOFRAN) tablet 4 mg  4 mg Oral Q8H PRN Thermon Leyland, NP      . sertraline (ZOLOFT) tablet 50 mg  50 mg Oral Daily Thermon Leyland, NP   50 mg at 03/18/15 5409  . traZODone (DESYREL) tablet 50 mg  50 mg Oral QHS PRN Thermon Leyland, NP    50 mg at 03/17/15 2203   PTA Medications: Prescriptions prior to admission  Medication Sig Dispense Refill Last Dose  . ibuprofen (ADVIL,MOTRIN) 200 MG tablet Take 400 mg by mouth every 6 (six) hours as needed for moderate pain.   last month  . methimazole (TAPAZOLE) 10 MG tablet Take 2 tablets (20 mg total) by mouth 2 (two) times daily. 120 tablet 3 03/15/2015 at Unknown time    Musculoskeletal: Strength & Muscle Tone: within normal limits Gait & Station: normal Patient leans: N/A  Psychiatric Specialty Exam: Physical Exam  Review of Systems  Constitutional: Negative.   Eyes: Negative.   Respiratory: Negative.   Cardiovascular: Negative.   Gastrointestinal: Positive for nausea. Negative for vomiting, constipation and blood in stool.  Genitourinary: Negative.   Musculoskeletal: Negative.   Skin: Negative.   Neurological: Positive for headaches. Negative for seizures.  Endo/Heme/Allergies: Negative.   Psychiatric/Behavioral: Positive for depression and suicidal ideas.  All other systems reviewed and are negative.   Blood pressure 124/86, pulse 109, temperature 98.7 F (37.1 C), temperature source Oral, resp. rate 14, height 5\' 4"  (1.626 m), weight 151 lb (68.493 kg), last menstrual period 02/18/2015, SpO2 100 %, not currently breastfeeding.Body mass index is 25.91 kg/(m^2).  General Appearance: Fairly Groomed  Patent attorney::  Good  Speech:  Normal Rate  Volume:  Normal  Mood:  Depressed  Affect:  Constricted  Thought Process:  Linear  Orientation:  Full (Time, Place, and Person)  Thought Content:  no hallucinations, no delusions, ruminative about recent stressors, losses   Suicidal Thoughts:  Yes.  without intent/plan- denies any plan or intention of hurting self or of SI, contracts for safety on the unit   Homicidal Thoughts:  No  Memory:  recent and remote grossly intact   Judgement:  Fair  Insight:  Fair  Psychomotor Activity:  Normal  Concentration:  Good  Recall:   Good  Fund of Knowledge:Good  Language: Good  Akathisia:  Negative  Handed:  Right  AIMS (if indicated):     Assets:  Communication Skills Desire for Improvement Resilience  ADL's:  Intact  Cognition: WNL  Sleep:  Number of Hours: 5.75     Treatment Plan Summary: Daily contact with patient to assess and evaluate symptoms and progress in treatment, Medication management, Plan inpatient admission and medications as below  Observation Level/Precautions:  15 minute checks  Laboratory:  as needed- patient requesting to get tested for STDs, and due to mildly elevated AST/ALT, will check for  Viral hepatitis ( Hep B /Hep C .)   Psychotherapy:  Milieu,  Groups   Medications:  Has been started on Zoloft 50 mgrs QDAY for depression. Also on Tapazole for history of hyperthyroidism  Consultations:  As needed   Discharge Concerns:  -   Estimated LOS: 5-6 days   Other:     I certify that inpatient services furnished can reasonably be expected to improve the patient's condition.   Rechy Bost 11/29/201611:19 AM

## 2015-03-18 NOTE — [facility] Group Notes (Signed)
Adult Psychoeducational Group Note  Date:  03/18/2015 Time:  9:11 PM  Group Topic/Focus:  Wrap-Up Group:   The focus of this group is to help patients review their daily goal of treatment and discuss progress on daily workbooks.  Participation Level:  Minimal  Participation Quality:  Attentive  Affect:  Flat  Cognitive:  Appropriate  Insight: Limited  Engagement in Group:  Limited  Modes of Intervention:  Discussion  Additional Comments:  Patient stated her day was okay.  She expressed it was a regular day.  Her goal was to to get stable.  Caroll RancherLindsay, Tishia Maestre A 03/18/2015, 9:11 PM

## 2015-03-18 NOTE — [facility] Suicide Risk Assessment (Signed)
[facility] INPATIENT:  Family/Significant Other Suicide Prevention Education  Suicide Prevention Education:  Patient Refusal for Family/Significant Other Suicide Prevention Education: The patient Grace [name] has refused to provide written consent for family/significant other to be provided Family/Significant Other Suicide Prevention Education during admission and/or prior to discharge.  Physician notified. SPE reviewed with patient and brochure provided. Patient encouraged to return to hospital if having suicidal thoughts, patient verbalized his/her understanding and has no further questions at this time.   Elaina HoopsCarter, Falicia Lizotte M 03/18/2015, 11:54 AM

## 2015-03-18 NOTE — Progress Notes (Signed)
NUTRITION ASSESSMENT  Pt identified as at risk on the Malnutrition Screen Tool  INTERVENTION: 1. Educated patient on the importance of nutrition and encouraged intake of food and beverages. 2. Discussed weight goals. 3. Supplements: none  NUTRITION DIAGNOSIS: No nutrition dx at this time.  Goal: Pt to meet >/= 90% of their estimated nutrition needs.  Monitor:  PO intake  Assessment:  Pt seen for MST. Pt admitted for SI and insomnia. Pt unsure of recent weight changes. Per chart review, pt has lost 3 lbs (2% body weight) in 2.5 months which is not significant for time frame.  No nutrition intervention needed at this time.  29 y.o. female  Height: Ht Readings from Last 1 Encounters:  03/16/15 5\' 4"  (1.626 m)    Weight: Wt Readings from Last 1 Encounters:  03/16/15 151 lb (68.493 kg)    Weight Hx: Wt Readings from Last 10 Encounters:  03/16/15 151 lb (68.493 kg)  03/15/15 151 lb (68.493 kg)  03/08/15 151 lb (68.493 kg)  01/15/15 151 lb 4.8 oz (68.629 kg)  12/26/14 154 lb (69.854 kg)  12/07/13 145 lb (65.772 kg)  05/08/13 167 lb 5 oz (75.892 kg)  03/28/13 185 lb (83.915 kg)  03/27/13 185 lb 14.4 oz (84.324 kg)  03/20/13 178 lb 14.4 oz (81.149 kg)    BMI:  Body mass index is 25.91 kg/(m^2). Pt meets criteria for overweight based on current BMI.  Estimated Nutritional Needs: Kcal: 25-30 kcal/kg Protein: > 1 gram protein/kg Fluid: 1 ml/kcal  Diet Order: Diet Heart Room service appropriate?: Yes; Fluid consistency:: Thin Pt is also offered choice of unit snacks mid-morning and mid-afternoon.  Pt is eating as desired.   Lab results and medications reviewed.       Trenton GammonJessica Jaycie Kregel, RD, LDN Inpatient Clinical Dietitian Pager # [phone] After hours/weekend pager # [phone]

## 2015-03-18 NOTE — [facility] Group Notes (Signed)
[facility] LCSW Group Therapy 03/18/2015 1:15 PM  Type of Therapy: Group Therapy- Feelings about Diagnosis  Participation Level: Reserved  Participation Quality:  Appropriate  Affect:  Flat  Cognitive: Alert and Oriented   Insight:  Developing   Engagement in Therapy: Developing/Improving and Engaged   Modes of Intervention: Clarification, Confrontation, Discussion, Education, Exploration, Limit-setting, Orientation, Problem-solving, Rapport Building, Dance movement psychotherapisteality Testing, Socialization and Support  Description of Group:   This group will allow patients to explore their thoughts and feelings about diagnoses they have received. Patients will be guided to explore their level of understanding and acceptance of these diagnoses. Facilitator will encourage patients to process their thoughts and feelings about the reactions of others to their diagnosis, and will guide patients in identifying ways to discuss their diagnosis with significant others in their lives. This group will be process-oriented, with patients participating in exploration of their own experiences as well as giving and receiving support and challenge from other group members.  Summary of Progress/Problems:  Pt was reserved in group but did participate, interacting well with peers. She identified that some of her family members do not believe in mental illness which invalidates her struggle with depression and inclines her to isolate even more.   Therapeutic Modalities:   Cognitive Behavioral Therapy Solution Focused Therapy Motivational Interviewing Relapse Prevention Therapy  Chad CordialLauren Carter, LCSWA 03/18/2015 3:27 PM
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
## 2015-03-18 NOTE — Progress Notes (Signed)
Patient ID: Grace [name], female   DOB: 02/04/1986, 29 y.o.   MRN: [id number] D:Affect is sad ,mood is depressed. States that she wants to work on "getting back to myself" and intends to do this by talking more in groups and get the relationship issue off of her mind she says. A:Support and encouragement offered. R:Receptive. No complaints of pain or problems at this time.

## 2015-03-18 NOTE — Tx Team (Signed)
Interdisciplinary Treatment Plan Update (Adult) Date: 03/18/2015   Date: 03/18/2015 1:07 PM  Progress in Treatment:  Attending groups: Pt is new to milieu, continuing to assess  Participating in groups: Pt is new to milieu, continuing to assess  Taking medication as prescribed: Yes  Tolerating medication: Yes  Family/Significant othe contact made: No, Pt declines Patient understands diagnosis: Yes AEB seeking help with depression Discussing patient identified problems/goals with staff: Yes  Medical problems stabilized or resolved: Yes  Denies suicidal/homicidal ideation: Yes Patient has not harmed self or Others: Yes   New problem(s) identified: None identified at this time.   Discharge Plan or Barriers: Pt will return home and follow-up with [facility] [place]  Additional comments:  Patient and CSW reviewed pt's identified goals and treatment plan. Patient verbalized understanding and agreed to treatment plan. CSW reviewed [facility] "Discharge Process and Patient Involvement" Form. Pt verbalized understanding of information provided and signed form.   Reason for Continuation of Hospitalization:  Depression Medication stabilization Suicidal ideation  Estimated length of stay: 3-5 days  Review of initial/current patient goals per problem list:   1.  Goal(s): Patient will participate in aftercare plan  Met:  Yes  Target date: 3-5 days from date of admission   As evidenced by: Patient will participate within aftercare plan AEB aftercare provider and housing plan at discharge being identified.   03/18/15: Pt will discharge home and follow-up with [facility] [place]  2.  Goal (s): Patient will exhibit decreased depressive symptoms and suicidal ideations.  Met:  No  Target date: 3-5 days from date of admission   As evidenced by: Patient will utilize self rating of depression at 3 or below and demonstrate decreased signs of depression or be deemed stable  for discharge by MD. 03/18/15: Pt was admitted with symptoms of depression, rating 10/10. Pt continues to present with flat affect and depressive symptoms.  Pt will demonstrate decreased symptoms of depression and rate depression at 3/10 or lower prior to discharge.   Attendees:  Patient:    Family:    Physician: Dr. Parke Poisson, MD  03/18/2015 1:07 PM  Nursing: Lars Pinks, RN Case manager  03/18/2015 1:07 PM  Clinical Social Worker Peri Maris, [place] 03/18/2015 1:07 PM  Other: Tilden Fossa, [place] 03/18/2015 1:07 PM  Clinical: Darrol Angel RN 03/18/2015 1:07 PM  Other: , RN Charge Nurse 03/18/2015 1:07 PM  Other: Hilda Lias, [place], [place] Social Work [phone]

## 2015-03-19 LAB — RPR: RPR: NONREACTIVE

## 2015-03-19 LAB — T4, FREE: Free T4: 1.37 ng/dL — ABNORMAL HIGH (ref 0.61–1.12)

## 2015-03-19 LAB — TSH: TSH: 0.419 u[IU]/mL (ref 0.350–4.500)

## 2015-03-19 LAB — HIV ANTIBODY (ROUTINE TESTING W REFLEX): HIV SCREEN 4TH GENERATION: NONREACTIVE

## 2015-03-19 MED ORDER — SERTRALINE HCL 25 MG PO TABS
75.0000 mg | ORAL_TABLET | Freq: Every day | ORAL | Status: DC
  Administered 2015-03-20: 75 mg via ORAL
  Filled 2015-03-19 (×2): qty 3

## 2015-03-19 NOTE — BHH Group Notes (Signed)
[facility] LCSW Group Therapy 03/19/2015 1:15 PM  Type of Therapy: Group Therapy- Emotion Regulation  Participation Level: Minimal  Participation Quality:  Reserved  Affect: Flat  Cognitive: Alert and Oriented   Insight:  Developing/Improving  Engagement in Therapy: Developing/Improving and Engaged   Modes of Intervention: Clarification, Confrontation, Discussion, Education, Exploration, Limit-setting, Orientation, Problem-solving, Rapport Building, Dance movement psychotherapisteality Testing, Socialization and Support  Summary of Progress/Problems: The topic for group today was emotional regulation. This group focused on both positive and negative emotion identification and allowed group members to process ways to identify feelings, regulate negative emotions, and find healthy ways to manage internal/external emotions. Group members were asked to reflect on a time when their reaction to an emotion led to a negative outcome and explored how alternative responses using emotion regulation would have benefited them. Group members were also asked to discuss a time when emotion regulation was utilized when a negative emotion was experienced. Pt participates minimally in group but is observed to be attentive to group discussion.   Grace [name], LCSWA 03/19/2015 3:16 PM

## 2015-03-19 NOTE — Progress Notes (Signed)
D: Pt continues to be very flat and depressed on the unit today. Pt also continues to be very isolative and not interacting much. Pt has been cooperative with her care and medications.  Pt reported that his depression was a 10, his hopelessness was a 10, and that his anxiety was a 10. Pt reported passive SI with no plan, no HI, AH/VH noted and contracted for safety. A: 15 min checks continued for patient safety. R: Pts safety maintained.

## 2015-03-19 NOTE — Progress Notes (Signed)
[facility] MD Progress Note  03/19/2015 4:10 PM Grace [name]  MRN:  [id number] Subjective:   Patient states she is feeling " a little better today", but still depressed, sad, and today ruminative about her family not coming to visit her in spite of knowing she is in hospital. Denies medication side effects.  Objective : I have discussed case with treatment team and have met with patient. Remains depressed, sad, although affect is reactive and tended to improve during session. As above, ruminates about recent relationship break up and on perceived lack of support from family. Going to some groups, limited interactions with peers . As per chart notes, has continued to characterize her depression as severe, 8/10 today.  Does not endorse medication side effects. No disruptive or agitated behaviors on unit  Labs- RPR, HIV are negative  TSH WNL, FT4 marginally elevated - patient has history of hyperthyroidism and is on tapazole  Principal Problem: MDD (major depressive disorder), recurrent episode, moderate ([place]) Diagnosis:   Patient Active Problem List   Diagnosis Date Noted  . MDD (major depressive disorder), recurrent episode, moderate ([place]) [F33.1] 03/16/2015  . Consultation for female sterilization [G25.63] 05/09/2013  . NSVD (normal spontaneous vaginal delivery) [O80] 03/30/2013  . Chronic hypertension in pregnancy [O10.919] 03/29/2013  . HTN in pregnancy, chronic [O10.919] 03/28/2013  . Transient hypertension of pregnancy, antepartum [O13.9] 03/20/2013  . GERD (gastroesophageal reflux disease) [K21.9] 01/18/2013  . Hyperemesis gravidarum before end of [redacted] week gestation with electrolyte imbalance [O21.1] 09/23/2012  . Trichomonas [A59.9] 09/23/2012  . Supervision of high-risk pregnancy [O09.90] 04/19/2011  . History of molar pregnancy, antepartum [O09.A0] 03/22/2011  . Hyperthyroidism, antepartum [S93.734, E07.9] 03/16/2011  . Sickle cell trait ([place]) [D57.3] 12/30/2010   Total Time  spent with patient: 25 minutes     Past Medical History:  Past Medical History  Diagnosis Date  . Sickle cell trait ([place])   . Hyperthyroidism   . Sickle cell trait ([place])   . Cholecystitis 08/18/2011  . SVD (spontaneous vaginal delivery)     x 3 WH  . Hypertension   . History of recurrent UTIs     recurrent UTIs  . Headache(784.0)     otc med prn  . Joint pain     knees and shoulders - otc med prn    Past Surgical History  Procedure Laterality Date  . Cholecystectomy  08/19/2011    Procedure: LAPAROSCOPIC CHOLECYSTECTOMY WITH INTRAOPERATIVE CHOLANGIOGRAM;  Surgeon: Madilyn Hook, DO;  Location: [place];  Service: General;  Laterality: N/A;  . Dilation and curettage of uterus      molar preg  . Laparoscopic bilateral salpingectomy Bilateral 05/09/2013    Procedure: LAPAROSCOPIC BILATERAL SALPINGECTOMY;  Surgeon: Emily Filbert, MD;  Location: [facility];  Service: Gynecology;  Laterality: Bilateral;   Family History:  Family History  Problem Relation Age of Onset  . Diabetes Mother   . Cancer Father   . Diabetes Maternal Uncle   . Cancer Maternal Grandmother   . Cancer Maternal Grandfather   . Hypertension Maternal Grandfather   . Diabetes Maternal Grandfather   . Sickle cell anemia Son   . Asthma Son   . Heart disease Paternal Grandfather   . Hearing loss Paternal Aunt     Social History:  History  Alcohol Use No     History  Drug Use No    Social History   Social History  . Marital Status: Single    Spouse Name: N/A  . Number  of Children: N/A  . Years of Education: N/A   Social History Main Topics  . Smoking status: Never Smoker   . Smokeless tobacco: Never Used  . Alcohol Use: No  . Drug Use: No  . Sexual Activity: Yes    Birth Control/ Protection: Surgical   Other Topics Concern  . None   Social History Narrative   2 Adults, 4 children at home   FOB involved   Additional Social History:    Pain Medications: Denies abuse Prescriptions: Denies  abuse Over the Counter: Denies abuse History of alcohol / drug use?: No history of alcohol / drug abuse   Sleep: improving   Appetite:  Good  Current Medications: Current Facility-Administered Medications  Medication Dose Route Frequency Provider Last Rate Last Dose  . acetaminophen (TYLENOL) tablet 650 mg  650 mg Oral Q4H PRN Niel Hummer, NP      . alum & mag hydroxide-simeth (MAALOX/MYLANTA) 200-200-20 MG/5ML suspension 30 mL  30 mL Oral PRN Niel Hummer, NP      . hydrOXYzine (ATARAX/VISTARIL) tablet 25 mg  25 mg Oral Q6H PRN Niel Hummer, NP   25 mg at 03/19/15 0757  . ibuprofen (ADVIL,MOTRIN) tablet 600 mg  600 mg Oral Q8H PRN Niel Hummer, NP   600 mg at 03/18/15 1430  . LORazepam (ATIVAN) tablet 1 mg  1 mg Oral Q8H PRN Niel Hummer, NP   1 mg at 03/19/15 1417  . methimazole (TAPAZOLE) tablet 20 mg  20 mg Oral BID Niel Hummer, NP   20 mg at 03/19/15 0754  . ondansetron (ZOFRAN) tablet 4 mg  4 mg Oral Q8H PRN Niel Hummer, NP      . Derrill Memo ON 03/20/2015] sertraline (ZOLOFT) tablet 75 mg  75 mg Oral Daily Jenne Campus, MD      . traZODone (DESYREL) tablet 100 mg  100 mg Oral QHS PRN Jenne Campus, MD   100 mg at 03/18/15 2135    Lab Results:  Results for orders placed or performed during the hospital encounter of 03/16/15 (from the past 48 hour(s))  HIV antibody (routine testing) (NOT for [facility])     Status: None   Collection Time: 03/17/15  6:38 PM  Result Value Ref Range   HIV Screen 4th Generation wRfx Non Reactive Non Reactive    Comment: (NOTE) Performed At: [facility] [place], [place] [id number] Lindon Romp MD [id number] Performed at [facility]   RPR     Status: None   Collection Time: 03/17/15  6:38 PM  Result Value Ref Range   RPR Ser Ql Non Reactive Non Reactive    Comment: (NOTE) Performed At: [facility] [place], [place] [id number] Lindon Romp MD  [id number] Performed at [facility]   HIV antibody     Status: None   Collection Time: 03/19/15  6:35 AM  Result Value Ref Range   HIV Screen 4th Generation wRfx Non Reactive Non Reactive    Comment: (NOTE) Performed At: [facility] [place], [place] [id number] Lindon Romp MD [id number] Performed at [facility]   RPR     Status: None   Collection Time: 03/19/15  6:35 AM  Result Value Ref Range   RPR Ser Ql Non Reactive Non Reactive    Comment: (NOTE) Performed At: [facility] [address] [place], [place] [id number] Darrel Hoover  F MD [id number] Performed at [facility]   TSH     Status: None   Collection Time: 03/19/15  6:35 AM  Result Value Ref Range   TSH 0.419 0.350 - 4.500 uIU/mL    Comment: Performed at [facility]  T4, free     Status: Abnormal   Collection Time: 03/19/15  6:35 AM  Result Value Ref Range   Free T4 1.37 (H) 0.61 - 1.12 ng/dL    Comment: Performed at [facility]    Physical Findings: AIMS: Facial and Oral Movements Muscles of Facial Expression: None, normal Lips and Perioral Area: None, normal Jaw: None, normal Tongue: None, normal,Extremity Movements Upper (arms, wrists, hands, fingers): None, normal Lower (legs, knees, ankles, toes): None, normal, Trunk Movements Neck, shoulders, hips: None, normal, Overall Severity Severity of abnormal movements (highest score from questions above): None, normal Incapacitation due to abnormal movements: None, normal Patient's awareness of abnormal movements (rate only patient's report): No Awareness, Dental Status Current problems with teeth and/or dentures?: No Does patient usually wear dentures?: No  CIWA:    COWS:     Musculoskeletal: Strength & Muscle Tone: within normal limits Gait & Station: normal Patient leans: N/A  Psychiatric Specialty Exam: ROS denies chest  pain, no SOB, (+) palpitations, no vomiting   Blood pressure 105/80, pulse 115, temperature 98.2 F (36.8 C), temperature source Oral, resp. rate 16, height _0  (1.626 m), weight 151 lb (68.493 kg), last menstrual period 02/18/2015, SpO2 100 %, not currently breastfeeding.Body mass index is 25.91 kg/(m^2).  General Appearance: Fairly Groomed  Engineer, water::  Fair  Speech:  Normal Rate  Volume:  Decreased  Mood:  Anxious and Depressed  Affect:  Congruent  Thought Process:  Linear  Orientation:  Full (Time, Place, and Person)  Thought Content:  denies hallucinations, no delusions, not internally preoccupied   Suicidal Thoughts:  No- at this time denies any suicidal ideations, contracts for safety on unit   Homicidal Thoughts:  No  Memory:  recent and remote grossly intact   Judgement:  Fair  Insight:  Fair  Psychomotor Activity:  Normal- no restlessness or agitation  Concentration:  Good  Recall:  Good  Fund of Knowledge:Good  Language: Good  Akathisia:  Negative  Handed:  Right  AIMS (if indicated):     Assets:  Communication Skills Desire for Improvement Resilience  ADL's:  Intact  Cognition: WNL  Sleep:  Number of Hours: 5.75  Assessment - patient remains depressed and somewhat anxious, today ruminative and saddened because of perceived lack of family support, whom she states have not come to visit her on unit in spite of knowledge she is here . Denies medication side effects at this time.  Although depressed, denies any suicidal ideations and contracts for safety at this time Tolerating Zoloft well thus far. Treatment Plan Summary: Daily contact with patient to assess and evaluate symptoms and progress in treatment, Medication management, Plan inpatient admission and medications as below Continue to encourage group participation, milieu, to work on coping skills and symptom reduction Continue Tapazole for history of hyperthyroidism Continue Trazodone 100 mgrs QHS PRN for  insomnia, as needed  Increase Zoloft to 75 mgrs QDAY for management of depression Continue Ativan 1 mgr Q 8 hours PRN for severe anxiety, as needed  COBOS, FERNANDO 03/19/2015, 4:10 PM

## 2015-03-19 NOTE — Progress Notes (Signed)
Recreation Therapy Notes  Date: 11.30.2016 Time: 9:30am Location: [address] Group Room  Group Topic: Stress Management  Goal Area(s) Addresses:  Patient will actively participate in stress management techniques presented during session.   Behavioral Response: Did not attend.   Carmello Cabiness L Billiejean Schimek, LRT/CTRS        Harlyn Italiano L 03/19/2015 7:27 PM 

## 2015-03-19 NOTE — Progress Notes (Signed)
   03/18/15 2150  Vital Signs  Pulse Rate (!) 113  Pulse Rate Source Dinamap  Resp 16  BP (!) 133/93 mmHg  BP Location Right Arm  BP Method Automatic  Patient Position (if appropriate) Sitting  Pain Assessment  Pain Assessment No/denies pain   D: Patient alert and oriented x 4. Patient denies pain/SI/HI/AVH. Patient reports her heart is racing she states she thinks its due to her hyperthyroidism. Vitals obtained. PA notified of complaint. PA ordered thyroid labs in the AM lab draw. Patient made aware.  A: Staff to monitor Q 15 mins for safety. Encouragement and support offered. Scheduled medications administered per orders. R: Patient remains safe on the unit. Patient attended group tonight. Patient visible on hte unit and interacting with peers. Patient taking administered medications.

## 2015-03-19 NOTE — [facility] Group Notes (Signed)
[facility]  03/19/2015 8:45 AM  Participation Quality: Alert, Appropriate and Oriented  Mood/Affect: Flat; "So-So"  Depression Rating: 8  Anxiety Rating: 8  Thoughts of Suicide: Pt endorses passive SI  Will you contract for safety? Yes  Current AVH: Pt denies  Plan for Discharge/Comments: Pt attended discharge planning group and actively participated in group. CSW discussed suicide prevention education with the group and encouraged them to discuss discharge planning and any relevant barriers. Pt continue to present with flat affect; reports that she did not sleep well last night. No needs expressed at this time. She will return home and [facility]  Transportation Means: Pt reports access to transportation  Supports: No supports mentioned at this time  Chad CordialLauren Carter, LCSWA 03/19/2015 9:10 AM

## 2015-03-20 LAB — T3: T3 TOTAL: 167 ng/dL (ref 71–180)

## 2015-03-20 LAB — T4: T4, Total: 13.1 ug/dL — ABNORMAL HIGH (ref 4.5–12.0)

## 2015-03-20 LAB — T3, FREE: T3 FREE: 3.9 pg/mL (ref 2.0–4.4)

## 2015-03-20 LAB — HEPATITIS B SURFACE ANTIGEN: Hepatitis B Surface Ag: NEGATIVE

## 2015-03-20 LAB — HEPATITIS C ANTIBODY: HCV Ab: <0.1 s/co ratio (ref 0.0–0.9)

## 2015-03-20 MED ORDER — PRAZOSIN HCL 1 MG PO CAPS
1.0000 mg | ORAL_CAPSULE | Freq: Every day | ORAL | Status: DC
  Administered 2015-03-20: 1 mg via ORAL
  Filled 2015-03-20 (×3): qty 1

## 2015-03-20 MED ORDER — CITALOPRAM HYDROBROMIDE 10 MG PO TABS
10.0000 mg | ORAL_TABLET | Freq: Every day | ORAL | Status: DC
  Administered 2015-03-20 – 2015-03-21 (×2): 10 mg via ORAL
  Filled 2015-03-20 (×3): qty 1

## 2015-03-20 NOTE — Progress Notes (Signed)
Patient ID: Grace [name], female   DOB: 12/28/1985, 29 y.o.   MRN: [id number] [facility] MD Progress Note  03/20/2015 11:57 AM [place]  MRN:  [id number] Subjective:    Patient states she still feels depressed, and in fact feels more depressed today. She states she does not think Zoloft is working for her, and although she expresses understanding that antidepressants may take days to weeks for clinical effect, states " I think it does not agree with me" and is hoping for another medication trial. Another reason for increased depression today is that today is father's anniversary of death. She states she has had chronic grief and loss symptoms since his passing away, which often worsen around this time of year. She states she was present when he died , and this was very traumatic for her- states she has nightmares about this , often waking her up during the night .  Objective : I have discussed case with treatment team and have met with patient. As above , reports ongoing depression , sadness, describes some passive thoughts of death , but denies any suicidal plans or intentions and contracts for safety on unit. As above, prefers to switch to another antidepressant .  She is visible on unit, going to some groups, behavior calm and in good control. Noted by staff to present depressed, anxious . Labs- Hep BSAg Negative , Hep C AB Negative  FT4 marginally elevated .   Principal Problem: MDD (major depressive disorder), recurrent episode, moderate ([place]) Diagnosis:   Patient Active Problem List   Diagnosis Date Noted  . MDD (major depressive disorder), recurrent episode, moderate ([employer]) [F33.1] 03/16/2015  . Consultation for female sterilization [O96.29] 05/09/2013  . NSVD (normal spontaneous vaginal delivery) [O80] 03/30/2013  . Chronic hypertension in pregnancy [O10.919] 03/29/2013  . HTN in pregnancy, chronic [O10.919] 03/28/2013  . Transient hypertension of pregnancy, antepartum [O13.9]  03/20/2013  . GERD (gastroesophageal reflux disease) [K21.9] 01/18/2013  . Hyperemesis gravidarum before end of [redacted] week gestation with electrolyte imbalance [O21.1] 09/23/2012  . Trichomonas [A59.9] 09/23/2012  . Supervision of high-risk pregnancy [O09.90] 04/19/2011  . History of molar pregnancy, antepartum [O09.A0] 03/22/2011  . Hyperthyroidism, antepartum [B28.413, E07.9] 03/16/2011  . Sickle cell trait ([place]) [D57.3] 12/30/2010   Total Time spent with patient: 25 minutes     Past Medical History:  Past Medical History  Diagnosis Date  . Sickle cell trait ([place])   . Hyperthyroidism   . Sickle cell trait ([place])   . Cholecystitis 08/18/2011  . SVD (spontaneous vaginal delivery)     x 3 WH  . Hypertension   . History of recurrent UTIs     recurrent UTIs  . Headache(784.0)     otc med prn  . Joint pain     knees and shoulders - otc med prn    Past Surgical History  Procedure Laterality Date  . Cholecystectomy  08/19/2011    Procedure: LAPAROSCOPIC CHOLECYSTECTOMY WITH INTRAOPERATIVE CHOLANGIOGRAM;  Surgeon: Madilyn Hook, DO;  Location: [facility];  Service: General;  Laterality: N/A;  . Dilation and curettage of uterus      molar preg  . Laparoscopic bilateral salpingectomy Bilateral 05/09/2013    Procedure: LAPAROSCOPIC BILATERAL SALPINGECTOMY;  Surgeon: Emily Filbert, MD;  Location: [facility];  Service: Gynecology;  Laterality: Bilateral;   Family History:  Family History  Problem Relation Age of Onset  . Diabetes Mother   . Cancer Father   . Diabetes Maternal Uncle   .  Cancer Maternal Grandmother   . Cancer Maternal Grandfather   . Hypertension Maternal Grandfather   . Diabetes Maternal Grandfather   . Sickle cell anemia Son   . Asthma Son   . Heart disease Paternal Grandfather   . Hearing loss Paternal Aunt     Social History:  History  Alcohol Use No     History  Drug Use No    Social History   Social History  . Marital Status: Single    Spouse Name: N/A  .  Number of Children: N/A  . Years of Education: N/A   Social History Main Topics  . Smoking status: Never Smoker   . Smokeless tobacco: Never Used  . Alcohol Use: No  . Drug Use: No  . Sexual Activity: Yes    Birth Control/ Protection: Surgical   Other Topics Concern  . None   Social History Narrative   2 Adults, 4 children at home   FOB involved   Additional Social History:    Pain Medications: Denies abuse Prescriptions: Denies abuse Over the Counter: Denies abuse History of alcohol / drug use?: No history of alcohol / drug abuse   Sleep: improving   Appetite:  Good  Current Medications: Current Facility-Administered Medications  Medication Dose Route Frequency Provider Last Rate Last Dose  . acetaminophen (TYLENOL) tablet 650 mg  650 mg Oral Q4H PRN Niel Hummer, NP      . alum & mag hydroxide-simeth (MAALOX/MYLANTA) 200-200-20 MG/5ML suspension 30 mL  30 mL Oral PRN Niel Hummer, NP      . citalopram (CELEXA) tablet 10 mg  10 mg Oral Daily [name], MD      . hydrOXYzine (ATARAX/VISTARIL) tablet 25 mg  25 mg Oral Q6H PRN Niel Hummer, NP   25 mg at 03/19/15 0757  . ibuprofen (ADVIL,MOTRIN) tablet 600 mg  600 mg Oral Q8H PRN Niel Hummer, NP   600 mg at 03/19/15 2207  . LORazepam (ATIVAN) tablet 1 mg  1 mg Oral Q8H PRN Niel Hummer, NP   1 mg at 03/20/15 0749  . methimazole (TAPAZOLE) tablet 20 mg  20 mg Oral BID Niel Hummer, NP   20 mg at 03/20/15 0750  . ondansetron (ZOFRAN) tablet 4 mg  4 mg Oral Q8H PRN Niel Hummer, NP      . prazosin (MINIPRESS) capsule 1 mg  1 mg Oral QHS [name], MD      . traZODone (DESYREL) tablet 100 mg  100 mg Oral QHS PRN Jenne Campus, MD   100 mg at 03/19/15 2207    Lab Results:  Results for orders placed or performed during the hospital encounter of 03/16/15 (from the past 48 hour(s))  Hepatitis B surface antigen     Status: None   Collection Time: 03/19/15  6:35 AM  Result Value Ref Range   Hepatitis B  Surface Ag Negative Negative    Comment: (NOTE) Performed At: [facility] [place], [place] [id number] Lindon Romp MD [id number] Performed at [facility]   Hepatitis C antibody     Status: None   Collection Time: 03/19/15  6:35 AM  Result Value Ref Range   HCV Ab <0.1 0.0 - 0.9 s/co ratio    Comment: (NOTE)  Negative:     < 0.8                             Indeterminate: 0.8 - 0.9                                  Positive:     > 0.9 The CDC recommends that a positive HCV antibody result be followed up with a HCV Nucleic Acid Amplification test (595638). Performed At: [facility] [place], [place] [id number] Lindon Romp MD [id number] Performed at [facility]   HIV antibody     Status: None   Collection Time: 03/19/15  6:35 AM  Result Value Ref Range   HIV Screen 4th Generation wRfx Non Reactive Non Reactive    Comment: (NOTE) Performed At: [facility] [place], [place] [id number] Lindon Romp MD [id number] Performed at [facility]   RPR     Status: None   Collection Time: 03/19/15  6:35 AM  Result Value Ref Range   RPR Ser Ql Non Reactive Non Reactive    Comment: (NOTE) Performed At: [facility] [place], [place] [id number] Lindon Romp MD [id number] Performed at [facility]   TSH     Status: None   Collection Time: 03/19/15  6:35 AM  Result Value Ref Range   TSH 0.419 0.350 - 4.500 uIU/mL    Comment: Performed at [facility]  T4, free     Status: Abnormal   Collection Time: 03/19/15  6:35 AM  Result Value Ref Range   Free T4 1.37 (H) 0.61 - 1.12 ng/dL    Comment: Performed at [facility]  T4     Status: Abnormal   Collection Time: 03/19/15  6:35 AM  Result Value Ref Range   T4, Total 13.1 (H) 4.5 - 12.0 ug/dL     Comment: (NOTE) Performed At: [facility] [place], [place] [id number] Lindon Romp MD [id number] Performed at [facility]   T3, free     Status: None   Collection Time: 03/19/15  6:35 AM  Result Value Ref Range   T3, Free 3.9 2.0 - 4.4 pg/mL    Comment: (NOTE) Performed At: [facility] [place], [place] [id number] Lindon Romp MD [id number] Performed at [facility]   T3     Status: None   Collection Time: 03/19/15  6:35 AM  Result Value Ref Range   T3, Total 167 71 - 180 ng/dL    Comment: (NOTE) Performed At: [facility] [place], [place] [id number] Lindon Romp MD [id number] Performed at [facility]     Physical Findings: AIMS: Facial and Oral Movements Muscles of Facial Expression: None, normal Lips and Perioral Area: None, normal Jaw: None, normal Tongue: None, normal,Extremity Movements Upper (arms, wrists, hands, fingers): None, normal Lower (legs, knees, ankles, toes): None, normal, Trunk Movements Neck, shoulders, hips: None, normal, Overall Severity Severity of abnormal movements (highest score from questions above): None, normal Incapacitation due to abnormal movements: None, normal Patient's awareness of abnormal movements (rate only patient's report): No Awareness, Dental Status Current problems with teeth and/or dentures?: No Does patient usually wear dentures?: No  CIWA:    COWS:  Musculoskeletal: Strength & Muscle Tone: within normal limits Gait & Station: normal Patient leans: N/A  Psychiatric Specialty Exam: ROS denies chest pain, no SOB, (+) palpitations, no vomiting   Blood pressure 105/80, pulse 115, temperature 98.2 F (36.8 C), temperature source Oral, resp. rate 16, height $RemoveBe'5\' 4"'sVaudKgFz$  (1.626 m), weight 151 lb (68.493 kg), last menstrual period 02/18/2015, SpO2 100 %, not currently  breastfeeding.Body mass index is 25.91 kg/(m^2).  General Appearance: Fairly Groomed  Engineer, water::  Good  Speech:  Normal Rate  Volume:  Normal  Mood:  States continues to feel depressed   Affect:  Congruent, but affect more reactive today, smiling at times appropriately  Thought Process:  Linear  Orientation:  Full (Time, Place, and Person)  Thought Content:  denies hallucinations, no delusions, not internally preoccupied   Suicidal Thoughts:  No- at this time denies any plan or intention of hurting herself or of SI, contracts for safety on unit   Homicidal Thoughts:  No  Memory:  recent and remote grossly intact   Judgement:  Fair  Insight:  Fair  Psychomotor Activity:  Normal- no restlessness or agitation  Concentration:  Good  Recall:  Good  Fund of Knowledge:Good  Language: Good  Akathisia:  Negative  Handed:  Right  AIMS (if indicated):     Assets:  Communication Skills Desire for Improvement Resilience  ADL's:  Intact  Cognition: WNL  Sleep:  Number of Hours: 5.75  Assessment - patient reports increased depression , which she attributes partly to today being father's anniversary of death. She has had some vague/ passive SI, but denies any plan or intentions of hurting self . She is presenting with a fuller range of affect today. She states Zoloft not helpful and is concerned that it may be making her feel worse- wants to try another antidepressant - we discussed options, due to anxiety in addition to depression, and SSRI may be best indicated at this time. Agrees to Celexa trial. Will also start Minipress to address nightmares , which are sometimes disrupting her sleep. Medication side effects and rationales discussed . Treatment Plan Summary: Daily contact with patient to assess and evaluate symptoms and progress in treatment, Medication management, Plan inpatient admission and medications as below Continue to encourage group participation, milieu, to work on coping skills  and symptom reduction Continue Tapazole for history of hyperthyroidism Continue Trazodone 100 mgrs QHS PRN for insomnia, as needed  D/C Zoloft  Start Celexa 10 mgrs QDAY for depression and anxiety Start Minipress 1 mgrs QHS for nightmares  Continue Ativan 1 mgr Q 8 hours PRN for severe anxiety, as needed  COBOS, FERNANDO 03/20/2015, 11:57 AM

## 2015-03-20 NOTE — Progress Notes (Signed)
D: Pt presents with flat affect and depressed mood. Pt appears sad this morning. Pt reported that today is the anniversary of her father's death. Pt rates depression 10/10. Hopeless 8/10. Anxiety 10/10. Pt endorses suicidal thoughts w/o a plan. Pt verbally contracts not to harm self. Pt c/o having ongoing nightmares at bedtime. MD made aware. Pt requested prn Ativan this morning for anxiety. Med given at pt's request. Pt compliant with taking meds and attending groups. A: Medications administered as ordered per MD. Verbal support given. Pt encouraged to attend groups. 15 minute checks performed for safety. R: Pt receptive to tx.

## 2015-03-20 NOTE — [facility] Group Notes (Signed)
[facility] Group Therapy 03/20/2015 1:15pm  Type of Therapy: [facility] [employer] Presentation  Participation Level: Active  Participation Quality: Attentive  Affect: Appropriate  Cognitive: Oriented  Insight: Developing/Improving  Engagement in Therapy: Engaged  Modes of Intervention: Discussion, Education and Socialization  Summary of Progress/Problems: [facility] [employer] (M[facility]) Speaker came to talk about his personal journey with substance abuse and addiction. The pt processed ways by which to relate to the speaker. MHA speaker provided handouts and educational information pertaining to groups and services offered by the MHA. Pt was engaged in speaker's presentation and was receptive to resources provided.    [employer] Teegarden Carter, LCSWA 03/20/2015 1:30 PM  

## 2015-03-20 NOTE — Progress Notes (Signed)
Patient attend karaoke group tonight.  

## 2015-03-20 NOTE — Progress Notes (Signed)
D. Pt had been up and visible in milieu this evening, did attend and participate in evening group activity. Pt seen interacting appropriately with fellow peers, pt spoke of her day and spoke about how she was doing a little better today, did endorse some anxiety and spoke about how she has been dealing with depression for awhile and has gotten worse in the past week leading up to the hospitalization. Pt received all medications without incident this evening. A. Support and encouragement provided. R. Safety maintained, will continue to monitor.

## 2015-03-21 DIAGNOSIS — R000 Tachycardia, unspecified: Secondary | ICD-10-CM

## 2015-03-21 DIAGNOSIS — R002 Palpitations: Secondary | ICD-10-CM

## 2015-03-21 DIAGNOSIS — R079 Chest pain, unspecified: Secondary | ICD-10-CM | POA: Diagnosis present

## 2015-03-21 DIAGNOSIS — E0590 Thyrotoxicosis, unspecified without thyrotoxic crisis or storm: Secondary | ICD-10-CM | POA: Diagnosis present

## 2015-03-21 MED ORDER — INFLUENZA VAC SPLIT QUAD 0.5 ML IM SUSY
0.5000 mL | PREFILLED_SYRINGE | INTRAMUSCULAR | Status: AC
  Administered 2015-03-22: 0.5 mL via INTRAMUSCULAR
  Filled 2015-03-21: qty 0.5

## 2015-03-21 MED ORDER — METOPROLOL TARTRATE 25 MG PO TABS
12.5000 mg | ORAL_TABLET | Freq: Two times a day (BID) | ORAL | Status: DC
  Administered 2015-03-21 – 2015-03-26 (×10): 12.5 mg via ORAL
  Filled 2015-03-21 (×2): qty 0.5
  Filled 2015-03-21: qty 1
  Filled 2015-03-21 (×10): qty 0.5

## 2015-03-21 MED ORDER — QUETIAPINE FUMARATE 25 MG PO TABS
25.0000 mg | ORAL_TABLET | Freq: Every day | ORAL | Status: DC
  Administered 2015-03-21 – 2015-03-23 (×3): 25 mg via ORAL
  Filled 2015-03-21 (×5): qty 1

## 2015-03-21 MED ORDER — CITALOPRAM HYDROBROMIDE 20 MG PO TABS
20.0000 mg | ORAL_TABLET | Freq: Every day | ORAL | Status: DC
  Administered 2015-03-22 – 2015-03-26 (×5): 20 mg via ORAL
  Filled 2015-03-21 (×6): qty 1

## 2015-03-21 NOTE — Plan of Care (Signed)
Problem: Diagnosis: Increased Risk For Suicide Attempt Goal: STG-Patient Will Report Suicidal Feelings to Staff Outcome: Progressing Pt continues to report passive SI without a plan.  She verbally contracts for safety.

## 2015-03-21 NOTE — Progress Notes (Signed)
D: Pt has depressed affect and labile mood.  Upon initial shift assessment, pt reports her day has "been fair, right now I'm kind of depressed and sad."  Pt reports her goal is to "get sleep, we changed my sleeping medication."  Later in shift, pt came to writer and reported feeling "depressed, angry, anxious."  Pt denies HI, denies hallucinations, denies pain.  She reports passive SI without a plan.  Pt verbally contracts for safety.  Pt attended evening group.   A: Met with pt 1:1 and provided support and encouragement.  Actively listened to pt.  Positive coping skills encouraged.  Medications administered per order.  PRN medication administered for anxiety. R: Pt is compliant with medications.  Pt verbally contracts for safety.  Will continue to monitor and assess.

## 2015-03-21 NOTE — Progress Notes (Signed)
D: Pt has depressed affect and mood.  She reports her goal today was "instead of keeping my emotions in, to talk about it."  Pt reported she was able to do this "a little."  Pt's pulse was elevated and she reported feeling light-headed.  Pt reports passive SI without a plan.  Pt verbally contracts for safety.  Pt denies HI, reports visual hallucinations, stating "I see shadows."  Pt denies auditory hallucinations.  Pt denies pain.  Pt has been visible in milieu interacting with peers and staff appropriately.  Pt attended evening group.   A: Introduced self to pt.  Encouraged and provided PO fluids.  Encouraged pt to rest if she is not feeling well.  She reported that her pulse usually run high.  Met with pt 1:1 and provided support and encouragement.  Actively listened to pt.  Medications administered per order.  PRN medication administered for sleep and anxiety. R: Pt is compliant with medications.  Pt verbally contracts for safety.  Will continue to monitor and assess.

## 2015-03-21 NOTE — Progress Notes (Signed)
Patient ID: Grace [name], female   DOB: [birth year], 29 y.o.   MRN: [id number] [facility] MD Progress Note  03/21/2015 9:20 AM [place]  MRN:  [id number] Subjective:    Patient  Reports ongoing depression, some anxiety. Ruminative about negative issues, particularly her being upset about her family, mother not coming to visit her on unit, in spite of their knowing she is hospitalized. States " I feel like they don't care " Denies medication side effects.  Objective : I have discussed case with treatment team and have met with patient. As noted, reports ongoing depression, anxiety, denies suicidal plan or intention, contracts for safety on the unit, sleep was fair. Patient worries about insomnia and does not feel current medications are helping. Identifies night time anxiety as an issue as well. We discussed options such as increasing Trazodone dose- prefers to try another medication and agrees to low dose  Seroquel trial. Denies medication side effects. However, may be presenting with increased tachycardia after Minipress, which was started last night for nightmares . She remains visible on unit, going to  groups, behavior calm and in good control. No self injurious or agitated behaviors . Of note, patient has history of Hyperthyroidism , for which she is on Tapazole. She is noted to be persistently tachycardic.   Principal Problem: MDD (major depressive disorder), recurrent episode, moderate ([place]) Diagnosis:   Patient Active Problem List   Diagnosis Date Noted  . MDD (major depressive disorder), recurrent episode, moderate ([place]) [F33.1] 03/16/2015  . Consultation for female sterilization [V74.82] 05/09/2013  . NSVD (normal spontaneous vaginal delivery) [O80] 03/30/2013  . Chronic hypertension in pregnancy [O10.919] 03/29/2013  . HTN in pregnancy, chronic [O10.919] 03/28/2013  . Transient hypertension of pregnancy, antepartum [O13.9] 03/20/2013  . GERD (gastroesophageal reflux  disease) [K21.9] 01/18/2013  . Hyperemesis gravidarum before end of [redacted] week gestation with electrolyte imbalance [O21.1] 09/23/2012  . Trichomonas [A59.9] 09/23/2012  . Supervision of high-risk pregnancy [O09.90] 04/19/2011  . History of molar pregnancy, antepartum [O09.A0] 03/22/2011  . Hyperthyroidism, antepartum [L07.867, E07.9] 03/16/2011  . Sickle cell trait ([place]) [D57.3] 12/30/2010   Total Time spent with patient: 25 minutes     Past Medical History:  Past Medical History  Diagnosis Date  . Sickle cell trait ([place])   . Hyperthyroidism   . Sickle cell trait ([place])   . Cholecystitis 08/18/2011  . SVD (spontaneous vaginal delivery)     x 3 WH  . Hypertension   . History of recurrent UTIs     recurrent UTIs  . Headache(784.0)     otc med prn  . Joint pain     knees and shoulders - otc med prn    Past Surgical History  Procedure Laterality Date  . Cholecystectomy  08/19/2011    Procedure: LAPAROSCOPIC CHOLECYSTECTOMY WITH INTRAOPERATIVE CHOLANGIOGRAM;  Surgeon: Madilyn Hook, DO;  Location: [place];  Service: General;  Laterality: N/A;  . Dilation and curettage of uterus      molar preg  . Laparoscopic bilateral salpingectomy Bilateral 05/09/2013    Procedure: LAPAROSCOPIC BILATERAL SALPINGECTOMY;  Surgeon: Emily Filbert, MD;  Location: [facility];  Service: Gynecology;  Laterality: Bilateral;   Family History:  Family History  Problem Relation Age of Onset  . Diabetes Mother   . Cancer Father   . Diabetes Maternal Uncle   . Cancer Maternal Grandmother   . Cancer Maternal Grandfather   . Hypertension Maternal Grandfather   . Diabetes Maternal Grandfather   . Sickle  cell anemia Son   . Asthma Son   . Heart disease Paternal Grandfather   . Hearing loss Paternal Aunt     Social History:  History  Alcohol Use No     History  Drug Use No    Social History   Social History  . Marital Status: Single    Spouse Name: N/A  . Number of Children: N/A  . Years of Education:  N/A   Social History Main Topics  . Smoking status: Never Smoker   . Smokeless tobacco: Never Used  . Alcohol Use: No  . Drug Use: No  . Sexual Activity: Yes    Birth Control/ Protection: Surgical   Other Topics Concern  . None   Social History Narrative   2 Adults, 4 children at home   FOB involved   Additional Social History:    Pain Medications: Denies abuse Prescriptions: Denies abuse Over the Counter: Denies abuse History of alcohol / drug use?: No history of alcohol / drug abuse   Sleep:  Fair   Appetite:  Good  Current Medications: Current Facility-Administered Medications  Medication Dose Route Frequency Provider Last Rate Last Dose  . acetaminophen (TYLENOL) tablet 650 mg  650 mg Oral Q4H PRN Niel Hummer, NP      . alum & mag hydroxide-simeth (MAALOX/MYLANTA) 200-200-20 MG/5ML suspension 30 mL  30 mL Oral PRN Niel Hummer, NP   30 mL at 03/20/15 1451  . [START ON 03/22/2015] citalopram (CELEXA) tablet 20 mg  20 mg Oral Daily Myer Peer Tawna Alwin, MD      . hydrOXYzine (ATARAX/VISTARIL) tablet 25 mg  25 mg Oral Q6H PRN Niel Hummer, NP   25 mg at 03/20/15 2232  . ibuprofen (ADVIL,MOTRIN) tablet 600 mg  600 mg Oral Q8H PRN Niel Hummer, NP   600 mg at 03/19/15 2207  . LORazepam (ATIVAN) tablet 1 mg  1 mg Oral Q8H PRN Niel Hummer, NP   1 mg at 03/20/15 1609  . methimazole (TAPAZOLE) tablet 20 mg  20 mg Oral BID Niel Hummer, NP   20 mg at 03/21/15 8453  . ondansetron (ZOFRAN) tablet 4 mg  4 mg Oral Q8H PRN Niel Hummer, NP      . QUEtiapine (SEROQUEL) tablet 25 mg  25 mg Oral QHS Jenne Campus, MD        Lab Results:  No results found for this or any previous visit (from the past 48 hour(s)).  Physical Findings: AIMS: Facial and Oral Movements Muscles of Facial Expression: None, normal Lips and Perioral Area: None, normal Jaw: None, normal Tongue: None, normal,Extremity Movements Upper (arms, wrists, hands, fingers): None, normal Lower (legs, knees,  ankles, toes): None, normal, Trunk Movements Neck, shoulders, hips: None, normal, Overall Severity Severity of abnormal movements (highest score from questions above): None, normal Incapacitation due to abnormal movements: None, normal Patient's awareness of abnormal movements (rate only patient's report): No Awareness, Dental Status Current problems with teeth and/or dentures?: No Does patient usually wear dentures?: No  CIWA:    COWS:     Musculoskeletal: Strength & Muscle Tone: within normal limits Gait & Station: normal Patient leans: N/A  Psychiatric Specialty Exam: ROS denies chest pain, no SOB, (+) palpitations, no vomiting   Blood pressure 110/81, pulse 133, temperature 98.8 F (37.1 C), temperature source Oral, resp. rate 18, height $RemoveBe'5\' 4"'nViyxToBY$  (1.626 m), weight 151 lb (68.493 kg), last menstrual period 02/18/2015, SpO2 100 %, not currently  breastfeeding.Body mass index is 25.91 kg/(m^2).  General Appearance: Fairly Groomed  Engineer, water::  Good  Speech:  Normal Rate  Volume:  Normal  Mood: depressed   Affect:  affect more reactive , but still tends to be sad, ruminative   Thought Process:  Linear  Orientation:  Full (Time, Place, and Person)  Thought Content:  denies hallucinations, no delusions, not internally preoccupied   Suicidal Thoughts:  No- at this time denies any plan or intention of hurting herself or of SI, contracts for safety on unit   Homicidal Thoughts:  No  Memory:  recent and remote grossly intact   Judgement:  Fair  Insight:  Fair  Psychomotor Activity:  Normal- no restlessness or agitation  Concentration:  Good  Recall:  Good  Fund of Knowledge:Good  Language: Good  Akathisia:  Negative  Handed:  Right  AIMS (if indicated):     Assets:  Communication Skills Desire for Improvement Resilience  ADL's:  Intact  Cognition: WNL  Sleep:  Number of Hours: 6  Assessment - ongoing depression, sadness, negative ruminations, in particular about her feeling  hurt by her family not visiting on the unit. Denies SI. Tolerating medications well at this time, but persistent tachycardia noted, which may be related to underlying history of hyperthyroidism and may have worsened on Minipress . Treatment Plan Summary: Daily contact with patient to assess and evaluate symptoms and progress in treatment, Medication management, Plan inpatient admission and medications as below Continue to encourage group participation, milieu, to work on coping skills and symptom reduction Continue Tapazole for history of hyperthyroidism Start Seroquel 25 mgrs QHS for insomnia and  Night time anxiety Increase  Celexa  To 20  mgrs QDAY for depression and anxiety D/C Minipress D/C Trazodone  Requested Hospitalist Consultation to review for tachycardia, possible B blocker management  Continue Ativan 1 mgr Q 8 hours PRN for severe anxiety, as needed  Harsha Yusko 03/21/2015, 9:20 AM

## 2015-03-21 NOTE — Consult Note (Cosign Needed)
Consult NoteTEA COLLUMS NFA:[id number] DOB: 10/24/1985 DOA: 03/16/2015 PCP: [facility]  Requesting physician: Dr. Jama Flavors   Reason for consultation: Hyperthyroidism, palpitations, tachycardia    History of Present Illness:   Grace [name] is an 29 y.o. female with a PMH of hyperthyroidism diagnosed in 2007. She was started on Tapazole in 2014 after she became pregnant. She was admitted to [facility] on 03/15/15 for treatment of suicidal ideation in the setting of bipolar 1 disorder and depression. Routine vital signs have shown persistent tachycardia, for which we have been consulted. Patient's review of systems is positive for decreased energy, weight loss, hot flashes, and occasional chest pain that is associated with arrhythmias, lasts a few hours at a time, is left-sided, and nonradiating. The last time this occurred was a few days ago, no current chest pain. No specific aggravating or alleviating factors. TSH is 0. 419, but free T4 slightly elevated. Heart rate is 106-133.  Review of Systems:   Review of Systems  Constitutional: Positive for weight loss and malaise/fatigue. Negative for fever and chills.  Eyes: Negative.   Respiratory: Positive for shortness of breath. Negative for cough.   Cardiovascular: Positive for chest pain and palpitations.  Gastrointestinal: Positive for nausea. Negative for vomiting, abdominal pain, diarrhea, constipation, blood in stool and melena.  Genitourinary: Negative.   Musculoskeletal: Negative.   Skin: Negative.   Neurological: Positive for dizziness and headaches.  Endo/Heme/Allergies: Negative.   Psychiatric/Behavioral: Positive for depression and suicidal ideas. The patient is nervous/anxious.     Allergies:    No Known Allergies  Past Medical History:     Past Medical History  Diagnosis Date  . Sickle cell trait ([facility])   . Hyperthyroidism   . Sickle cell trait ([facility])   . Cholecystitis  08/18/2011  . SVD (spontaneous vaginal delivery)     x 3 WH  . Hypertension   . History of recurrent UTIs     recurrent UTIs  . Headache(784.0)     otc med prn  . Joint pain     knees and shoulders - otc med prn    Past Surgical History:   Past Surgical History  Procedure Laterality Date  . Cholecystectomy  08/19/2011    Procedure: LAPAROSCOPIC CHOLECYSTECTOMY WITH INTRAOPERATIVE CHOLANGIOGRAM;  Surgeon: Lodema Pilot, DO;  Location: [facility];  Service: General;  Laterality: N/A;  . Dilation and curettage of uterus      molar preg  . Laparoscopic bilateral salpingectomy Bilateral 05/09/2013    Procedure: LAPAROSCOPIC BILATERAL SALPINGECTOMY;  Surgeon: Allie Bossier, MD;  Location: [facility];  Service: Gynecology;  Laterality: Bilateral;    Current Medications:   Scheduled Meds: . [START ON 03/22/2015] citalopram  20 mg Oral Daily  . [START ON 03/22/2015] Influenza vac split quadrivalent PF  0.5 mL Intramuscular Tomorrow-1000  . methimazole  20 mg Oral BID  . metoprolol tartrate  12.5 mg Oral BID  . QUEtiapine  25 mg Oral QHS   Continuous Infusions:  PRN Meds:.acetaminophen, alum & mag hydroxide-simeth, hydrOXYzine, ibuprofen, LORazepam, ondansetron  Social History:    reports that she has never smoked. She has never used smokeless tobacco. She reports that she does not drink alcohol or use illicit drugs.  Family History:   Family History  Problem Relation Age of Onset  . Diabetes Mother   . Cancer Father   . Diabetes Maternal Uncle   . Cancer Maternal Grandmother   . Cancer  Maternal Grandfather   . Hypertension Maternal Grandfather   . Diabetes Maternal Grandfather   . Sickle cell anemia Son   . Asthma Son   . Heart disease Paternal Grandfather   . Hearing loss Paternal Aunt      Physical Exam:    Filed Vitals:   03/20/15 1200 03/20/15 1201 03/21/15 0623 03/21/15 0624  BP: 120/78 121/71 122/65 110/81  Pulse: 106 123 117 133  Temp: 98.4 F (36.9 C)  98.8 F (37.1 C)    TempSrc: Oral  Oral   Resp: 20  18   Height:      Weight:      SpO2:       General: Alert, awake, oriented x3, in no acute distress. HEENT: No bruits, no goiter. Heart: Regular rate and rhythm, without murmurs, rubs, gallops. Lungs: Clear to auscultation bilaterally. Abdomen: Soft, nontender, nondistended, positive bowel sounds. Extremities: No clubbing cyanosis or edema with positive pedal pulses. Neuro: Grossly intact, nonfocal. Psych: Mood and affect normal.  Data Review:   Labs:  Basic Metabolic Panel:  Recent Labs Lab 03/15/15 2332  NA 139  K 4.5  CL 106  CO2 24  GLUCOSE 98  BUN 7  CREATININE 0.75  CALCIUM 9.6   GFR Estimated Creatinine Clearance: 96.8 mL/min (by C-G formula based on Cr of 0.75). Liver Function Tests:  Recent Labs Lab 03/15/15 2332  AST 52*  ALT 69*  ALKPHOS 38  BILITOT 0.6  PROT 7.7  ALBUMIN 4.3    CBC:  Recent Labs Lab 03/15/15 2332  WBC 10.4  HGB 13.1  HCT 37.7  MCV 88.5  PLT 368   Thyroid function studies  Recent Labs  03/19/15 0635  TSH 0.419  T4TOTAL 13.1*  T3FREE 3.9   Anemia work up No results for input(s): VITAMINB12, FOLATE, FERRITIN, TIBC, IRON, RETICCTPCT in the last 72 hours. Urinalysis    Component Value Date/Time   COLORURINE YELLOW 06/11/2014 1039   APPEARANCEUR CLEAR 06/11/2014 1039   LABSPEC 1.013 06/11/2014 1039   PHURINE 6.0 06/11/2014 1039   GLUCOSEU NEGATIVE 06/11/2014 1039   HGBUR TRACE* 06/11/2014 1039   BILIRUBINUR NEGATIVE 06/11/2014 1039   KETONESUR NEGATIVE 06/11/2014 1039   PROTEINUR NEGATIVE 06/11/2014 1039   UROBILINOGEN 0.2 06/11/2014 1039   NITRITE POSITIVE* 06/11/2014 1039   LEUKOCYTESUR SMALL* 06/11/2014 1039     Assessment and Plan:   Principal Problem:   MDD (major depressive disorder), recurrent episode, moderate (H[facility]) Active Problems:   Hyperthyroidism   Tachycardia   Palpitations   Chest pain  Patient likely has a high adrenergic state related to her  hyperthyroidism. She would benefit from beta blockade and therefore I have started her on Lopressor 12.5 mg twice a day. She has intermittent chest pain related to tachycardia/arrhythmias. We'll get a 12-lead EKG to rule out any associated ischemic changes. Continue Tapazole.  Thank you for this consultation.  We will follow the patient with you.  Time Spent on Consult: 1 hour.  Chau Sawin 03/21/2015, 4:38 PM

## 2015-03-21 NOTE — Progress Notes (Signed)
Adult Psychoeducational Group Note  Date:  03/21/2015 Time:  9:27 PM  Group Topic/Focus:  Wrap-Up Group:   The focus of this group is to help patients review their daily goal of treatment and discuss progress on daily workbooks.  Participation Level:  Active  Participation Quality:  Appropriate  Affect:  Appropriate  Cognitive:  Alert  Insight: Appropriate  Engagement in Group:  Engaged  Modes of Intervention:  Discussion  Additional Comments:  Patient stated having an "okay day". Patient's goal was not to keep everything in. Patient stated she was working on it. Patient stated her new goal was to get sleep.   Grace [name] L [name] 03/21/2015, 9:27 PM

## 2015-03-21 NOTE — Tx Team (Signed)
Interdisciplinary Treatment Plan Update (Adult) Date: 03/21/2015   Date: 03/21/2015 9:53 AM  Progress in Treatment:  Attending groups: Yes  Participating in groups: Yes, minimally  Taking medication as prescribed: Yes  Tolerating medication: Yes  Family/Significant othe contact made: No, Pt declines Patient understands diagnosis: Yes AEB seeking help with depression Discussing patient identified problems/goals with staff: Yes  Medical problems stabilized or resolved: Yes  Denies suicidal/homicidal ideation: Yes Patient has not harmed self or Others: Yes   New problem(s) identified: None identified at this time.   Discharge Plan or Barriers: Pt will return home and follow-up with [facility] [place]  Additional comments:  Patient and CSW reviewed pt's identified goals and treatment plan. Patient verbalized understanding and agreed to treatment plan. CSW reviewed [facility] "Discharge Process and Patient Involvement" Form. Pt verbalized understanding of information provided and signed form.   Reason for Continuation of Hospitalization:  Depression Medication stabilization Suicidal ideation  Estimated length of stay: 3-5 days  Review of initial/current patient goals per problem list:   1.  Goal(s): Patient will participate in aftercare plan  Met:  Yes  Target date: 3-5 days from date of admission   As evidenced by: Patient will participate within aftercare plan AEB aftercare provider and housing plan at discharge being identified.   03/18/15: Pt will discharge home and follow-up with [facility] [place]  2.  Goal (s): Patient will exhibit decreased depressive symptoms and suicidal ideations.  Met:  No  Target date: 3-5 days from date of admission   As evidenced by: Patient will utilize self rating of depression at 3 or below and demonstrate decreased signs of depression or be deemed stable for discharge by MD. 03/18/15: Pt was admitted with symptoms of  depression, rating 10/10. Pt continues to present with flat affect and depressive symptoms.  Pt will demonstrate decreased symptoms of depression and rate depression at 3/10 or lower prior to discharge. 03/21/15: Pt rates depression at 7/10; denies SI   Attendees:  Patient:    Family:    Physician: Dr. Parke Poisson, MD  03/21/2015 9:53 AM  Nursing: Lars Pinks, RN Case manager  03/21/2015 9:53 AM  Clinical Social Worker Peri Maris, [place] 03/21/2015 9:53 AM  Other: Tilden Fossa, LCSWA 03/21/2015 9:53 AM  Clinical: Marcella Dubs RN 03/21/2015 9:53 AM  Other: , RN Charge Nurse 03/21/2015 9:53 AM  Other: Hilda Lias, [place], [place] Social Work [phone]

## 2015-03-21 NOTE — [facility] Group Notes (Signed)
[facility] LCSW Group Therapy 03/21/2015 1:15pm  Type of Therapy: Group Therapy- Feelings Around Relapse and Recovery  Participation Level: Minimal  Participation Quality:  Reserved  Affect:  Flat  Cognitive: Alert and Oriented   Insight:  Developing   Engagement in Therapy: Developing/Improving and Engaged   Modes of Intervention: Clarification, Confrontation, Discussion, Education, Exploration, Limit-setting, Orientation, Problem-solving, Rapport Building, Dance movement psychotherapisteality Testing, Socialization and Support  Summary of Progress/Problems: The topic for today was feelings about relapse. The group discussed what relapse prevention is to them and identified triggers that they are on the path to relapse. Members also processed their feeling towards relapse and were able to relate to common experiences. Group also discussed coping skills that can be used for relapse prevention.  Pt continues to participate minimally in group. Pt did identify loneliness and feelings of being unvalued by family as triggers for relapse. Pt expresses that she has little family support and describes that they do not believe she has depression, telling her she is being dramatic. Pt reports that this makes her apathetic to change.   Therapeutic Modalities:   Cognitive Behavioral Therapy Solution-Focused Therapy Assertiveness Training Relapse Prevention Therapy    Lamar SprinklesLauren Carter, LCSWA [phone] 03/21/2015 3:12 PM

## 2015-03-21 NOTE — Plan of Care (Signed)
Problem: Diagnosis: Increased Risk For Suicide Attempt Goal: STG-Patient Will Attend All Groups On The Unit Outcome: Progressing Pt attended evening group on 03/20/15     

## 2015-03-21 NOTE — [facility] Group Notes (Signed)
[facility]  03/21/2015 8:45 AM  Participation Quality: Alert, Appropriate and Oriented  Mood/Affect: Flat  Depression Rating: 7  Anxiety Rating: 10  Thoughts of Suicide: Pt denies SI/HI  Will you contract for safety? Yes  Current AVH: Pt denies  Plan for Discharge/Comments: Pt attended discharge planning group and actively participated in group. CSW discussed suicide prevention education with the group and encouraged them to discuss discharge planning and any relevant barriers. Pt expresses that her anxiety is related to not knowing what is "happening at home" and her family is not answering her phone calls. Pt reports that she is still not sleeping well and would like her sleep medication changed.  Transportation Means: Pt reports access to transportation  Supports: No supports mentioned at this time  Chad CordialLauren Carter, LCSWA 03/21/2015 9:20 AM

## 2015-03-21 NOTE — Progress Notes (Signed)
Patient ID: Grace [name], female   DOB: 11/20/1985, 29 y.o.   MRN: [id number]   Pt currently presents with a flat affect and anxious behavior. Per self inventory, pt rates depression at a 9, hopelessness 9 and anxiety 0. Pt's daily goal is "opening up" and they intend to do so by "talking." Pt reports poor sleep, a fair appetite, low energy and poor concentration. Pt is restless today. Pt seen interacting with other pts in the dayroom.   Pt provided with medications per providers orders. Pt's labs and vitals were monitored throughout the day. Pt supported emotionally and encouraged to express concerns and questions. Pt educated on medications. EKG completed, no abnormalities noted.   Pt's safety ensured with 15 minute and environmental checks. Pt endorses SI, with no plan, pt states "I just always see it in my head." Pt verbally agrees to contact staff before acting on any harmful thoughts. Pt also endorses seeing "shadows in the corner of my eye, then I look and it's gone." Pt verbally agrees to contact staff if [facility] worsens or becomes frightening to the pt. Pt currently denies HI and auditory hallucinations. Pt verbally agrees to seek staff if HI or AH occurs. Will continue POC.

## 2015-03-22 NOTE — Progress Notes (Signed)
[facility] MD Progress Note  03/22/2015 10:24 AM Grace [name]  MRN:  [id number]  Subjective:     " I am still depressed I have a lot stuff going on".  Objective :Grace [name] is awake, alert and oriented X4 , found resting in bedroom. Report suicidal ideation that's ongoing. Denies homicidal ideation. Denies auditory or visual hallucination and does not appear to be responding to internal stimuli. Patient interacts well with staff and others. Patient reports she is medication compliant without mediation side effects. Report learning new coping skills, States she is learning to express feelings better. States her depression 10/10. Patient states "I am still depressed and I have a lot of stuff ongoing on. Ruminative about  Her mother not coming to visit and not able to see her 3 children. Denies medication side effects.Reports good appetite and resting "okay". Patient is asking to increase her Seroquel to help her fall asleep faster. Support, encouragement and reassurance was provided.    Principal Problem: MDD (major depressive disorder), recurrent episode, moderate (HCC) Diagnosis:   Patient Active Problem List   Diagnosis Date Noted  . Hyperthyroidism [E05.90] 03/21/2015  . Tachycardia [R00.0] 03/21/2015  . Palpitations [R00.2] 03/21/2015  . Chest pain [R07.9] 03/21/2015  . MDD (major depressive disorder), recurrent episode, moderate (HCC) [F33.1] 03/16/2015  . Consultation for female sterilization [Z30.09] 05/09/2013  . NSVD (normal spontaneous vaginal delivery) [O80] 03/30/2013  . Chronic hypertension in pregnancy [O10.919] 03/29/2013  . HTN in pregnancy, chronic [O10.919] 03/28/2013  . Transient hypertension of pregnancy, antepartum [O13.9] 03/20/2013  . GERD (gastroesophageal reflux disease) [K21.9] 01/18/2013  . Hyperemesis gravidarum before end of [redacted] week gestation with electrolyte imbalance [O21.1] 09/23/2012  . Trichomonas [A59.9] 09/23/2012  . Supervision of high-risk  pregnancy [O09.90] 04/19/2011  . History of molar pregnancy, antepartum [O09.A0] 03/22/2011  . Hyperthyroidism, antepartum [W09.811[O99.280, E07.9] 03/16/2011  . Sickle cell trait (HCC) [D57.3] 12/30/2010   Total Time spent with patient: 30 minutes    Past Medical History:  Past Medical History  Diagnosis Date  . Sickle cell trait (HCC)   . Hyperthyroidism   . Sickle cell trait (HCC)   . Cholecystitis 08/18/2011  . SVD (spontaneous vaginal delivery)     x 3 WH  . Hypertension   . History of recurrent UTIs     recurrent UTIs  . Headache(784.0)     otc med prn  . Joint pain     knees and shoulders - otc med prn    Past Surgical History  Procedure Laterality Date  . Cholecystectomy  08/19/2011    Procedure: LAPAROSCOPIC CHOLECYSTECTOMY WITH INTRAOPERATIVE CHOLANGIOGRAM;  Surgeon: Lodema PilotBrian Layton, DO;  Location: [facility] OR;  Service: General;  Laterality: N/A;  . Dilation and curettage of uterus      molar preg  . Laparoscopic bilateral salpingectomy Bilateral 05/09/2013    Procedure: LAPAROSCOPIC BILATERAL SALPINGECTOMY;  Surgeon: Allie BossierMyra C Dove, MD;  Location: [facility] ORS;  Service: Gynecology;  Laterality: Bilateral;   Family History:  Family History  Problem Relation Age of Onset  . Diabetes Mother   . Cancer Father   . Diabetes Maternal Uncle   . Cancer Maternal Grandmother   . Cancer Maternal Grandfather   . Hypertension Maternal Grandfather   . Diabetes Maternal Grandfather   . Sickle cell anemia Son   . Asthma Son   . Heart disease Paternal Grandfather   . Hearing loss Paternal Aunt     Social History:  History  Alcohol Use No  History  Drug Use No    Social History   Social History  . Marital Status: Single    Spouse Name: N/A  . Number of Children: N/A  . Years of Education: N/A   Social History Main Topics  . Smoking status: Never Smoker   . Smokeless tobacco: Never Used  . Alcohol Use: No  . Drug Use: No  . Sexual Activity: Yes    Birth Control/ Protection:  Surgical   Other Topics Concern  . None   Social History Narrative   2 Adults, 4 children at home   FOB involved   Additional Social History:    Pain Medications: Denies abuse Prescriptions: Denies abuse Over the Counter: Denies abuse History of alcohol / drug use?: No history of alcohol / drug abuse   Sleep:  Fair/ asking to increase Seroquel  Appetite:  Good  Current Medications: Current Facility-Administered Medications  Medication Dose Route Frequency Provider Last Rate Last Dose  . acetaminophen (TYLENOL) tablet 650 mg  650 mg Oral Q4H PRN Thermon Leyland, NP      . alum & mag hydroxide-simeth (MAALOX/MYLANTA) 200-200-20 MG/5ML suspension 30 mL  30 mL Oral PRN Thermon Leyland, NP   30 mL at 03/20/15 1451  . citalopram (CELEXA) tablet 20 mg  20 mg Oral Daily Craige Cotta, MD   20 mg at 03/22/15 0829  . hydrOXYzine (ATARAX/VISTARIL) tablet 25 mg  25 mg Oral Q6H PRN Thermon Leyland, NP   25 mg at 03/21/15 2157  . ibuprofen (ADVIL,MOTRIN) tablet 600 mg  600 mg Oral Q8H PRN Thermon Leyland, NP   600 mg at 03/19/15 2207  . Influenza vac split quadrivalent PF (FLUARIX) injection 0.5 mL  0.5 mL Intramuscular Tomorrow-1000 Rockey Situ Cobos, MD      . LORazepam (ATIVAN) tablet 1 mg  1 mg Oral Q8H PRN Thermon Leyland, NP   1 mg at 03/21/15 1901  . methimazole (TAPAZOLE) tablet 20 mg  20 mg Oral BID Thermon Leyland, NP   20 mg at 03/22/15 0829  . metoprolol tartrate (LOPRESSOR) tablet 12.5 mg  12.5 mg Oral BID Maryruth Bun Rama, MD   12.5 mg at 03/22/15 0828  . ondansetron (ZOFRAN) tablet 4 mg  4 mg Oral Q8H PRN Thermon Leyland, NP      . QUEtiapine (SEROQUEL) tablet 25 mg  25 mg Oral QHS Craige Cotta, MD   25 mg at 03/21/15 2141    Lab Results:  No results found for this or any previous visit (from the past 48 hour(s)).  Physical Findings: AIMS: Facial and Oral Movements Muscles of Facial Expression: None, normal Lips and Perioral Area: None, normal Jaw: None, normal Tongue: None,  normal,Extremity Movements Upper (arms, wrists, hands, fingers): None, normal Lower (legs, knees, ankles, toes): None, normal, Trunk Movements Neck, shoulders, hips: None, normal, Overall Severity Severity of abnormal movements (highest score from questions above): None, normal Incapacitation due to abnormal movements: None, normal Patient's awareness of abnormal movements (rate only patient's report): No Awareness, Dental Status Current problems with teeth and/or dentures?: No Does patient usually wear dentures?: No  CIWA:    COWS:     Musculoskeletal: Strength & Muscle Tone: within normal limits Gait & Station: normal Patient leans: N/A  Psychiatric Specialty Exam: Review of Systems  Psychiatric/Behavioral: Positive for depression and suicidal ideas. The patient is nervous/anxious (stable) and has insomnia.   All other systems reviewed and are negative.  denies chest  pain, no SOB, (+) palpitations, no vomiting   Blood pressure 119/76, pulse 97, temperature 98.4 F (36.9 C), temperature source Oral, resp. rate 18, height  (1.626 m), weight 68.493 kg (151 lb), last menstrual period 02/18/2015, SpO2 100 %, not currently breastfeeding.Body mass index is 25.91 kg/(m^2).  General Appearance: Casual and Fairly Groomed  Eye Contact::  Good  Speech:  Normal Rate  Volume:  Normal  Mood: depressed   Affect:  affect more reactive , but still tends to be sad, ruminative   Thought Process:  Linear  Orientation:  Full (Time, Place, and Person)  Thought Content:  denies hallucinations, no delusions, not internally preoccupied   Suicidal Thoughts:  Yes.  without intent/plan- at this time denies any plan or intention of hurting herself or of SI, contracts for safety on unit   Homicidal Thoughts:  No  Memory:  recent and remote grossly intact   Judgement:  Fair  Insight:  Fair  Psychomotor Activity:  Normal- no restlessness or agitation  Concentration:  Good  Recall:  Good  Fund of  Knowledge:Good  Language: Good  Akathisia:  Negative  Handed:  Right  AIMS (if indicated):     Assets:  Communication Skills Desire for Improvement Resilience  ADL's:  Intact  Cognition: WNL  Sleep:  Number of Hours: 6.75    I agree with current treatment plan on 03/22/2015, Patient seen face-to-face for psychiatric evaluation follow-up, chart reviewed.  Treatment Plan Summary: Daily contact with patient to assess and evaluate symptoms and progress in treatment, Medication management, Plan inpatient admission and medications as below Continue to encourage group participation, milieu, to work on coping skills and symptom reduction Continue Tapazole for history of hyperthyroidism Continue Seroquel 25 mgrs QHS for insomnia and  Night time anxiety/ May consider increase after EKG Increase  Celexa  To 20  Mg QDAY for depression and anxiety D/C Minipress D/C Trazodone  Requested Hospitalist Consultation to review for tachycardia, possible B blocker management  Continue Ativan 1 mgr Q 8 hours PRN for severe anxiety, as needed   Oneta Rack FNP-BC 03/22/2015, 10:24 AM

## 2015-03-22 NOTE — Progress Notes (Signed)
Internal Medicine Consult Progress Note   Grace [name] ZOX:[id number] DOB: 09/07/1985 DOA: 03/16/2015 PCP: [facility]  Requesting physician: Dr. Jama Flavorsobos Reason for consultation: Hyperthyroidism, palpitations, tachycardia  Brief Narrative:   Grace RedChaquita M Winkowski is an 29 y.o. female with a PMH of hyperthyroidism diagnosed in 2007. She was started on Tapazole in 2014 after she became pregnant. [facility] for treatment of suicidal ideation in the setting of bipolar 1 disorder and depression.   Assessment/Plan:   Principal Problem:   MDD (major depressive disorder), recurrent episode, moderate (HCC) Active Problems:   Hyperthyroidism   Tachycardia   Palpitations   Chest pain  Plan: Metoprolol started yesterday. Heart rate trending down. 12-lead EKG showed NSR without ischemic changes.    Subjective:    Chevon M Kroening reports less palpitations.  No reports of chest pain.  Objective:    Filed Vitals:   03/21/15 0624 03/21/15 1726 03/21/15 2040 03/21/15 2041  BP: 110/81 124/87 132/80 113/90  Pulse: 133 111 102 102  Temp:      TempSrc:      Resp:   18   Height:      Weight:      SpO2:       No intake or output data in the 24 hours ending 03/22/15 0714 Filed Weights   03/16/15 1200  Weight: 68.493 kg (151 lb)    Exam: Pt not examined today.   Data Reviewed:    Labs: Basic Metabolic Panel:  Recent Labs Lab 03/15/15 2332  NA 139  K 4.5  CL 106  CO2 24  GLUCOSE 98  BUN 7  CREATININE 0.75  CALCIUM 9.6   GFR Estimated Creatinine Clearance: 96.8 mL/min (by C-G formula based on Cr of 0.75). Liver Function Tests:  Recent Labs Lab 03/15/15 2332  AST 52*  ALT 69*  ALKPHOS 38  BILITOT 0.6  PROT 7.7  ALBUMIN 4.3   No results for input(s): LIPASE, AMYLASE in the last 168 hours. No results for input(s): AMMONIA in the last 168 hours. Coagulation profile No results for input(s): INR,  PROTIME in the last 168 hours.  CBC:  Recent Labs Lab 03/15/15 2332  WBC 10.4  HGB 13.1  HCT 37.7  MCV 88.5  PLT 368   Cardiac Enzymes: No results for input(s): CKTOTAL, CKMB, CKMBINDEX, TROPONINI in the last 168 hours. BNP (last 3 results) No results for input(s): PROBNP in the last 8760 hours. CBG: No results for input(s): GLUCAP in the last 168 hours. D-Dimer: No results for input(s): DDIMER in the last 72 hours. Hgb A1c: No results for input(s): HGBA1C in the last 72 hours. Lipid Profile: No results for input(s): CHOL, HDL, LDLCALC, TRIG, CHOLHDL, LDLDIRECT in the last 72 hours. Thyroid function studies: No results for input(s): TSH, T4TOTAL, T3FREE, THYROIDAB in the last 72 hours.  Invalid input(s): FREET3 Anemia work up: No results for input(s): VITAMINB12, FOLATE, FERRITIN, TIBC, IRON, RETICCTPCT in the last 72 hours. Sepsis Labs:  Recent Labs Lab 03/15/15 2332  WBC 10.4   Microbiology No results found for this or any previous visit (from the past 240 hour(s)).   Medications:   . citalopram  20 mg Oral Daily  . Influenza vac split quadrivalent PF  0.5 mL Intramuscular Tomorrow-1000  . methimazole  20 mg Oral BID  . metoprolol tartrate  12.5 mg Oral BID  . QUEtiapine  25 mg Oral QHS   Continuous Infusions:  Time spent: 15 minutes.   LOS: 5 days   RAMA,CHRISTINA  [facility] Pager [phone]. If unable to reach me by pager, please call my cell phone at [phone].  *Please refer to [email or web address], password [email or web address] to get updated schedule on who will round on this patient, as hospitalists switch teams weekly. If 7PM-7AM, please contact night-coverage at [email or web address], password [email or web address] for any overnight needs.  03/22/2015, 7:14 AM

## 2015-03-22 NOTE — [facility] Group Notes (Signed)
[place] Group Notes:  (Clinical Social Work)  03/22/2015     1:15-2:15PM  Summary of Progress/Problems:   The main focus of today's process group was to discuss healthy versus unhealthy coping techniques.  Patients listed needs that they have, as well as both unhealthy and healthy ways they have tried to get their needs met.  Motivational Interviewing was utilized to help patients explore in depth the common element within the group which was suicidal ideation and/or attempts.  Various individual explained their life circumstances and feelings that led to their SI, as well as protective factors that are in their lives.  The patient expressed that she is in the hospital for depression and suicidal ideation.  She dozed off for parts of group, and did not talk much.  Type of Therapy:  Group Therapy - Process   Participation Level:  Minimal  Participation Quality:  Drowsy  Affect:  Flat  Cognitive:  Oriented  Insight:  Improving  Engagement in Therapy:  Limited  Modes of Intervention:  Education, Motivational Interviewing  Selmer Dominion, LCSW 03/22/2015, 3:54 PM

## 2015-03-22 NOTE — Progress Notes (Signed)
Adult Psychoeducational Group Note  Date:  03/22/2015 Time: 09:40am  Group Topic/Focus:  Healthy Communication:   The focus of this group is to discuss communication, barriers to communication, as well as healthy ways to communicate with others.  Participation Level:  Did Not Attend  Participation Quality: n/a  Affect: n/a  Cognitive: n/a  Insight: n/a  Engagement in Group: n/a  Modes of Intervention:  Clarification, Discussion, Education, Orientation and Support  Additional Comments:  Pt was in bed asleep.   Aurora Maskwyman, Gracilyn Gunia E 03/22/2015, 11:29 AM

## 2015-03-22 NOTE — Progress Notes (Signed)
Adult Psychoeducational Group Note  Date:  03/22/2015 Time:  9:50 PM  Group Topic/Focus:  Wrap-Up Group:   The focus of this group is to help patients review their daily goal of treatment and discuss progress on daily workbooks.  Participation Level:  Active  Participation Quality:  Appropriate and Attentive  Affect:  Appropriate  Cognitive:  Alert and Appropriate  Insight: Appropriate and Good  Engagement in Group:  Engaged  Modes of Intervention:  Discussion  Additional Comments:  Patient reported having a "not so good day."  Patient confirmed working on her goal to obtain an adequate amount of sleep. Patient confirmed attending group throughout the day.  Patient reported having intentions to control her mood during the upcoming day.   Elmore GuiseSLOAN, Grace [name] N 03/22/2015, 9:50 PM

## 2015-03-22 NOTE — Progress Notes (Addendum)
Patient ID: Grace [name], female   DOB: 02/04/1986, 29 y.o.   MRN: [id number]   Pt currently presents with a flat affect and anxious behavior. Per self inventory, pt rates depression, hopelessness and anxiety at a 0. Pt's daily goal is to "not keep things bottled up" and they intend to do so by "talking." Pt reports good sleep, a good appetite, normal energy and good concentration.   Pt provided with medications per providers orders. Pt's labs and vitals were monitored throughout the day. Pt supported emotionally and encouraged to express concerns and questions. Pt educated on medications and how to utilize CBT for suicidal ideations and negative self thoughts. Pt encouraged to identify and contact a positive support person.   Pt's safety ensured with 15 minute and environmental checks. Pt currently denies SI/HI and A/V hallucinations. Pt verbally agrees to seek staff if SI/HI or A/VH occurs and to consult with staff before acting on these thoughts. Pt able to notify staff when SI occurs and pt experiences "more depression."  Will continue POC.

## 2015-03-23 DIAGNOSIS — R0789 Other chest pain: Secondary | ICD-10-CM

## 2015-03-23 DIAGNOSIS — R002 Palpitations: Secondary | ICD-10-CM

## 2015-03-23 DIAGNOSIS — E0590 Thyrotoxicosis, unspecified without thyrotoxic crisis or storm: Secondary | ICD-10-CM

## 2015-03-23 DIAGNOSIS — R000 Tachycardia, unspecified: Secondary | ICD-10-CM

## 2015-03-23 MED ORDER — LORAZEPAM 1 MG PO TABS
1.0000 mg | ORAL_TABLET | Freq: Once | ORAL | Status: AC
  Administered 2015-03-23: 1 mg via ORAL
  Filled 2015-03-23: qty 1

## 2015-03-23 NOTE — Progress Notes (Signed)
Adult Psychoeducational Group Note  Date:  03/23/2015 Time:  9:48 PM  Group Topic/Focus:  Wrap-Up Group:   The focus of this group is to help patients review their daily goal of treatment and discuss progress on daily workbooks.  Participation Level:  Active  Participation Quality:  Appropriate  Affect:  Appropriate  Cognitive:  Alert  Insight: Appropriate  Engagement in Group:  Engaged  Modes of Intervention:  Discussion  Additional Comments:   Patient stated having a good day besides another patient from the 300 hall called her out of her name in the cafeteria. Patient stated her goal for today was to not let her depression bother her today. Patient stated she was still working on it.  [name] [name] 03/23/2015, 9:48 PM

## 2015-03-23 NOTE — Progress Notes (Signed)
Met with pt 1:1 this morning.  Initially, Probation officer asked pt if she slept well and pt stated "I tossed and turned all night."  Pt was encouraged to discuss possible changes to sleep medication with her provider.  Pt then asked writer to come talk to her and she stated "I feel like cutting myself."  Pt reported her trigger to these thoughts was "I had a nightmare.  I had a dream that I did it."  Pt verbally contracts for safety.  She agreed to stay in the day room where staff can see her.  Staff on the hall made aware of what pt reported.

## 2015-03-23 NOTE — Progress Notes (Signed)
Patient ID: Grace [name], female   DOB: 1985-05-18, 29 y.o.   MRN: [id number] [facility] MD Progress Note  03/23/2015 12:05 PM Zamorah AERABELLA GALASSO  MRN:  [id number]  Subjective: " I am still depressed I have a lot stuff going on I just found out that my brother passed away on lastnight".  Objective :Sherryn M Lorey is awake, alert and oriented X4 , found resting in bedroom. Report suicidal ideation that's ongoing states that she will contract for safety. States that the thoughts are increased today due to recent situation. States anxiety is 10/10.Denies homicidal ideation. Denies auditory or visual hallucination and does not appear to be responding to internal stimuli. Patient interacts well with staff and others. Patient reports she is medication compliant without mediation side effects.  Ruminative about  her mother still not able to visit and not able to see her 3 children. Patient has concerns that she will be discharged in the middle of all that is going on. States she doesn't feel safe. " I just can't handle this right now."  Reports her biological brother passed away on last night. Denies medication side effects.Reports good appetite and resting "okay". Patient is asking to increase her Seroquel to help her fall asleep faster. Support, encouragement and reassurance was provided.    Principal Problem: MDD (major depressive disorder), recurrent episode, moderate (HCC) Diagnosis:   Patient Active Problem List   Diagnosis Date Noted  . Hyperthyroidism [E05.90] 03/21/2015  . Tachycardia [R00.0] 03/21/2015  . Palpitations [R00.2] 03/21/2015  . Chest pain [R07.9] 03/21/2015  . MDD (major depressive disorder), recurrent episode, moderate (HCC) [F33.1] 03/16/2015  . Consultation for female sterilization [Z30.09] 05/09/2013  . NSVD (normal spontaneous vaginal delivery) [O80] 03/30/2013  . Chronic hypertension in pregnancy [O10.919] 03/29/2013  . HTN in pregnancy, chronic [O10.919] 03/28/2013  .  Transient hypertension of pregnancy, antepartum [O13.9] 03/20/2013  . GERD (gastroesophageal reflux disease) [K21.9] 01/18/2013  . Hyperemesis gravidarum before end of [redacted] week gestation with electrolyte imbalance [O21.1] 09/23/2012  . Trichomonas [A59.9] 09/23/2012  . Supervision of high-risk pregnancy [O09.90] 04/19/2011  . History of molar pregnancy, antepartum [O09.A0] 03/22/2011  . Hyperthyroidism, antepartum [N82.956, E07.9] 03/16/2011  . Sickle cell trait (HCC) [D57.3] 12/30/2010   Total Time spent with patient: 30 minutes    Past Medical History:  Past Medical History  Diagnosis Date  . Sickle cell trait (HCC)   . Hyperthyroidism   . Sickle cell trait (HCC)   . Cholecystitis 08/18/2011  . SVD (spontaneous vaginal delivery)     x 3 WH  . Hypertension   . History of recurrent UTIs     recurrent UTIs  . Headache(784.0)     otc med prn  . Joint pain     knees and shoulders - otc med prn    Past Surgical History  Procedure Laterality Date  . Cholecystectomy  08/19/2011    Procedure: LAPAROSCOPIC CHOLECYSTECTOMY WITH INTRAOPERATIVE CHOLANGIOGRAM;  Surgeon: Lodema Pilot, DO;  Location: [facility] OR;  Service: General;  Laterality: N/A;  . Dilation and curettage of uterus      molar preg  . Laparoscopic bilateral salpingectomy Bilateral 05/09/2013    Procedure: LAPAROSCOPIC BILATERAL SALPINGECTOMY;  Surgeon: Allie Bossier, MD;  Location: [facility] ORS;  Service: Gynecology;  Laterality: Bilateral;   Family History:  Family History  Problem Relation Age of Onset  . Diabetes Mother   . Cancer Father   . Diabetes Maternal Uncle   . Cancer Maternal Grandmother   . Cancer  Maternal Grandfather   . Hypertension Maternal Grandfather   . Diabetes Maternal Grandfather   . Sickle cell anemia Son   . Asthma Son   . Heart disease Paternal Grandfather   . Hearing loss Paternal Aunt     Social History:  History  Alcohol Use No     History  Drug Use No    Social History   Social History   . Marital Status: Single    Spouse Name: N/A  . Number of Children: N/A  . Years of Education: N/A   Social History Main Topics  . Smoking status: Never Smoker   . Smokeless tobacco: Never Used  . Alcohol Use: No  . Drug Use: No  . Sexual Activity: Yes    Birth Control/ Protection: Surgical   Other Topics Concern  . None   Social History Narrative   2 Adults, 4 children at home   FOB involved   Additional Social History:    Pain Medications: Denies abuse Prescriptions: Denies abuse Over the Counter: Denies abuse History of alcohol / drug use?: No history of alcohol / drug abuse   Sleep:  Fair/ asking to increase Seroquel  Appetite:  Good  Current Medications: Current Facility-Administered Medications  Medication Dose Route Frequency Provider Last Rate Last Dose  . acetaminophen (TYLENOL) tablet 650 mg  650 mg Oral Q4H PRN Thermon Leyland, NP      . alum & mag hydroxide-simeth (MAALOX/MYLANTA) 200-200-20 MG/5ML suspension 30 mL  30 mL Oral PRN Thermon Leyland, NP   30 mL at 03/20/15 1451  . citalopram (CELEXA) tablet 20 mg  20 mg Oral Daily Craige Cotta, MD   20 mg at 03/23/15 0754  . hydrOXYzine (ATARAX/VISTARIL) tablet 25 mg  25 mg Oral Q6H PRN Thermon Leyland, NP   25 mg at 03/22/15 1705  . ibuprofen (ADVIL,MOTRIN) tablet 600 mg  600 mg Oral Q8H PRN Thermon Leyland, NP   600 mg at 03/19/15 2207  . LORazepam (ATIVAN) tablet 1 mg  1 mg Oral Q8H PRN Thermon Leyland, NP   1 mg at 03/22/15 2127  . methimazole (TAPAZOLE) tablet 20 mg  20 mg Oral BID Thermon Leyland, NP   20 mg at 03/23/15 0754  . metoprolol tartrate (LOPRESSOR) tablet 12.5 mg  12.5 mg Oral BID Maryruth Bun Rama, MD   12.5 mg at 03/23/15 0754  . ondansetron (ZOFRAN) tablet 4 mg  4 mg Oral Q8H PRN Thermon Leyland, NP      . QUEtiapine (SEROQUEL) tablet 25 mg  25 mg Oral QHS Craige Cotta, MD   25 mg at 03/22/15 2127    Lab Results:  No results found for this or any previous visit (from the past 48  hour(s)).  Physical Findings: AIMS: Facial and Oral Movements Muscles of Facial Expression: None, normal Lips and Perioral Area: None, normal Jaw: None, normal Tongue: None, normal,Extremity Movements Upper (arms, wrists, hands, fingers): None, normal Lower (legs, knees, ankles, toes): None, normal, Trunk Movements Neck, shoulders, hips: None, normal, Overall Severity Severity of abnormal movements (highest score from questions above): None, normal Incapacitation due to abnormal movements: None, normal Patient's awareness of abnormal movements (rate only patient's report): No Awareness, Dental Status Current problems with teeth and/or dentures?: No Does patient usually wear dentures?: No  CIWA:    COWS:     Musculoskeletal: Strength & Muscle Tone: within normal limits Gait & Station: normal Patient leans: N/A  Psychiatric Specialty  Exam: Review of Systems  Psychiatric/Behavioral: Positive for depression and suicidal ideas. The patient is nervous/anxious (stable) and has insomnia.   All other systems reviewed and are negative.  denies chest pain, no SOB, (+) palpitations, no vomiting   Blood pressure 111/71, pulse 101, temperature 98.7 F (37.1 C), temperature source Oral, resp. rate 16, height 5\' 4"  (1.626 m), weight 68.493 kg (151 lb), last menstrual period 02/18/2015, SpO2 99 %, not currently breastfeeding.Body mass index is 25.91 kg/(m^2).  General Appearance: Casual and Fairly Groomed  Eye Contact::  Good  Speech:  Normal Rate  Volume:  Normal  Mood: depressed   Affect: flat, tearful but still tends to be sad, ruminative   Thought Process:  Linear  Orientation:  Full (Time, Place, and Person)  Thought Content:  denies hallucinations, no delusions, not internally preoccupied   Suicidal Thoughts:  Yes.  without intent/plan- at this time denies any plan or intention of hurting herself or of SI, contracts for safety on unit states increasing thoughts w/o plan due to the recent  passing of her brother.  Homicidal Thoughts:  No  Memory:  recent and remote grossly intact   Judgement:  Fair  Insight:  Fair  Psychomotor Activity:  Normal- no restlessness or agitation  Concentration:  Good  Recall:  Good  Fund of Knowledge:Good  Language: Good  Akathisia:  Negative  Handed:  Right  AIMS (if indicated):     Assets:  Communication Skills Desire for Improvement Resilience  ADL's:  Intact  Cognition: WNL  Sleep:  Number of Hours: 6    I agree with current treatment plan on 03/23/2015, Patient seen face-to-face for psychiatric evaluation follow-up, chart reviewed.  Treatment Plan Summary: Daily contact with patient to assess and evaluate symptoms and progress in treatment, Medication management, Plan inpatient admission and medications as below Continue to encourage group participation, milieu, to work on coping skills and symptom reduction Continue Tapazole for history of hyperthyroidism Ativan 1 mg Po Now for increased anxiety  Continue Seroquel 25 mgrs QHS for insomnia and  Night time anxiety/ May consider increase after EKG Increase  Celexa  To 20  Mg QDAY for depression and anxiety D/C Minipress D/C Trazodone  Requested Hospitalist Consultation to review for tachycardia, possible B blocker management  Continue Ativan 1 mgr Q 8 hours PRN for severe anxiety, as needed   Oneta Rackanika N Lewis FNP-BC 03/23/2015, 12:05 PM

## 2015-03-23 NOTE — [facility] Group Notes (Signed)
[facility] Group Notes:  (Nursing/MHT/Case Management/Adjunct)  Date:  03/23/2015  Time:  2:29 PM  Type of Therapy:  Psychoeducational Skills  Participation Level:  Did Not Attend  Participation Quality:  N/A  Affect:  N/A  Cognitive:  N/A  Insight:  None  Engagement in Group:  None  Modes of Intervention:  Discussion and Education  Summary of Progress/Problems: The purpose of this group is to discuss healthy support systems. Patient's also were able to focus on perspectives and think about their irrational thoughts and asked to name one. She was invited to group but did not attend.   Marzetta BoardDopson, Yeira Gulden E 03/23/2015, 2:29 PM

## 2015-03-23 NOTE — [facility] Group Notes (Signed)
[facility] Group Notes:  (Clinical Social Work)   03/23/2015 1:15-2:15PM  Summary of Progress/Problems:   The main focus of today's process group was to   1)  discuss the importance of adding supports  2)  define health supports versus unhealthy supports  3)  identify the patient's current unhealthy supports and plan how to handle them  4)  Identify the patient's current healthy supports and plan what to add.  An emphasis was placed on using counselor, doctor, therapy groups, 12-step groups, and problem-specific support groups to expand supports.    The patient expressed full comprehension of the concepts presented, and agreed that there is a need to add more supports.  The patient stated she has no supports and supports have always hurt her, even professional ones.  When various things were suggested by CSW and others, she had no reaction, seemed to turn away physically and close down.  Type of Therapy:  Process Group with Motivational Interviewing  Participation Level:  Minimal  Participation Quality:  Attentive  Affect:  Depressed, Flat and Irritable  Cognitive:  Appropriate  Insight:  Limited  Engagement in Therapy:  Improving  Modes of Intervention:   Education, Support and Processing, Activity  Ambrose MantleMareida Grossman-Orr, LCSW 03/23/2015    4:22 PM

## 2015-03-23 NOTE — Progress Notes (Signed)
Internal Medicine Consult Progress Note   Grace RedChaquita M Torian ZOX:[id number] DOB: 09/13/1985 DOA: 03/16/2015 PCP: [facility]  Requesting physician: Dr. Jama Flavorsobos Reason for consultation: Hyperthyroidism, palpitations, tachycardia  Brief Narrative:   Grace [name] is an 29 y.o. female with a PMH of hyperthyroidism diagnosed in 2007. She was started on Tapazole in 2014 after she became pregnant. [facility] for treatment of suicidal ideation in the setting of bipolar 1 disorder and depression.   Assessment/Plan:   Principal Problem:   MDD (major depressive disorder), recurrent episode, moderate (HCC) Active Problems:   Hyperthyroidism   Tachycardia   Palpitations   Chest pain  Plan: Metoprolol started12/2/16. Heart rate down. 12-lead EKG showed NSR without ischemic changes.  Continue metoprolol.  Can titrate further if needed.  F/U with PCP post discharge.  Will sign off.  Call if further assistance needed.    Subjective:   Grace [name] reports less palpitations.  No reports of chest pain. Symptoms better controlled.  Objective:    Filed Vitals:   03/22/15 1147 03/22/15 1148 03/22/15 1700 03/22/15 1701  BP: 114/77 121/70 117/83 116/76  Pulse: 96  101 104  Temp: 98.5 F (36.9 C)  98.7 F (37.1 C)   TempSrc: Oral  Oral   Resp: 18  18   Height:      Weight:      SpO2: 97%  99%    No intake or output data in the 24 hours ending 03/23/15 0729 Filed Weights   03/16/15 1200  Weight: 68.493 kg (151 lb)    Exam: Pt not examined today.   Data Reviewed:    Labs: Basic Metabolic Panel: No results for input(s): NA, K, CL, CO2, GLUCOSE, BUN, CREATININE, CALCIUM, MG, PHOS in the last 168 hours. GFR Estimated Creatinine Clearance: 96.8 mL/min (by C-G formula based on Cr of 0.75). Liver Function Tests: No results for input(s): AST, ALT, ALKPHOS, BILITOT, PROT, ALBUMIN in the last 168 hours. No results for  input(s): LIPASE, AMYLASE in the last 168 hours. No results for input(s): AMMONIA in the last 168 hours. Coagulation profile No results for input(s): INR, PROTIME in the last 168 hours.  CBC: No results for input(s): WBC, NEUTROABS, HGB, HCT, MCV, PLT in the last 168 hours. Cardiac Enzymes: No results for input(s): CKTOTAL, CKMB, CKMBINDEX, TROPONINI in the last 168 hours. BNP (last 3 results) No results for input(s): PROBNP in the last 8760 hours. CBG: No results for input(s): GLUCAP in the last 168 hours. D-Dimer: No results for input(s): DDIMER in the last 72 hours. Hgb A1c: No results for input(s): HGBA1C in the last 72 hours. Lipid Profile: No results for input(s): CHOL, HDL, LDLCALC, TRIG, CHOLHDL, LDLDIRECT in the last 72 hours. Thyroid function studies: No results for input(s): TSH, T4TOTAL, T3FREE, THYROIDAB in the last 72 hours.  Invalid input(s): FREET3 Anemia work up: No results for input(s): VITAMINB12, FOLATE, FERRITIN, TIBC, IRON, RETICCTPCT in the last 72 hours. Sepsis Labs: No results for input(s): PROCALCITON, WBC, LATICACIDVEN in the last 168 hours. Microbiology No results found for this or any previous visit (from the past 240 hour(s)).   Medications:   . citalopram  20 mg Oral Daily  . methimazole  20 mg Oral BID  . metoprolol tartrate  12.5 mg Oral BID  . QUEtiapine  25 mg Oral QHS   Continuous Infusions:   Time spent: 15 minutes.   LOS: 6 days  RAMA,CHRISTINA  [facility] Pager [phone]. If unable to reach me by pager, please call my cell phone at [phone].  *Please refer to [email or web address], password [email or web address] to get updated schedule on who will round on this patient, as hospitalists switch teams weekly. If 7PM-7AM, please contact night-coverage at [email or web address], password [email or web address] for any overnight needs.  03/23/2015, 7:29 AM

## 2015-03-23 NOTE — Progress Notes (Signed)
D: Pt has depressed affect and anxious mood.  Pt reports "I had a bad day.  I had some mood changes, I was really happy and really depressed in a matter of seconds."  She reports her goal tonight is to "sleep."  Pt reports thoughts of self-harm without a plan.  She verbally contracts for safety.  Pt denies HI, denies auditory hallucinations, denies pain.  Pt reports visual hallucinations of seeing "shadows out of the corner of my eye."  Pt has been visible in milieu interacting with peers and staff appropriately.  Pt attended evening group.   A:  Met with pt 1:1 and provided support and encouragement.  Actively listened to pt.  Medications administered per order.  PRN medication administered for anxiety. R: Pt is compliant with medications.  Pt verbally contracts for safety.  Will continue to monitor and assess.

## 2015-03-24 LAB — URINE CYTOLOGY ANCILLARY ONLY
Chlamydia: NEGATIVE
Neisseria Gonorrhea: NEGATIVE

## 2015-03-24 MED ORDER — QUETIAPINE FUMARATE 50 MG PO TABS
50.0000 mg | ORAL_TABLET | Freq: Every day | ORAL | Status: DC
  Administered 2015-03-24 – 2015-03-25 (×2): 50 mg via ORAL
  Filled 2015-03-24 (×3): qty 1

## 2015-03-24 MED ORDER — PANTOPRAZOLE SODIUM 40 MG PO TBEC
40.0000 mg | DELAYED_RELEASE_TABLET | Freq: Every day | ORAL | Status: DC
  Administered 2015-03-24 – 2015-03-26 (×3): 40 mg via ORAL
  Filled 2015-03-24 (×4): qty 1

## 2015-03-24 NOTE — Plan of Care (Signed)
Problem: Alteration in mood Goal: LTG-Patient reports reduction in suicidal thoughts (Patient reports reduction in suicidal thoughts and is able to verbalize a safety plan for whenever patient is feeling suicidal)  Outcome: Not Progressing Pt has continued to report SI nightly.

## 2015-03-24 NOTE — Progress Notes (Signed)
Adult Psychoeducational Group Note  Date:  03/24/2015 Time:  9:34 PM  Group Topic/Focus:  Wrap-Up Group:   The focus of this group is to help patients review their daily goal of treatment and discuss progress on daily workbooks.  Participation Level:  Active  Participation Quality:  Appropriate  Affect:  Appropriate  Cognitive:  Alert  Insight: Appropriate  Engagement in Group:  Engaged  Modes of Intervention:  Discussion  Additional Comments:  Patient stated having an okay day. Patient stated her goal for today was to stay out of her room.   Aleczander Fandino L Antario Yasuda 03/24/2015, 9:34 PM

## 2015-03-24 NOTE — [facility] Group Notes (Signed)
[facility] LCSW Group Therapy  03/24/2015 1:15pm  Type of Therapy:  Group Therapy vercoming Obstacles  Participation Level:  Minimal  Participation Quality:  Reserved  Affect:  Flat  Cognitive:  Appropriate and Oriented  Insight:  Developing/Improving and Improving  Engagement in Therapy:  Limited  Modes of Intervention:  Discussion, Exploration, Problem-solving and Support  Description of Group:   In this group patients will be encouraged to explore what they see as obstacles to their own wellness and recovery. They will be guided to discuss their thoughts, feelings, and behaviors related to these obstacles. The group will process together ways to cope with barriers, with attention given to specific choices patients can make. Each patient will be challenged to identify changes they are motivated to make in order to overcome their obstacles. This group will be process-oriented, with patients participating in exploration of their own experiences as well as giving and receiving support and challenge from other group members.  Summary of Patient Progress: Pt inattentive in group, observed to be sleeping at times. She did not participate in group discussion.   Therapeutic Modalities:   Cognitive Behavioral Therapy Solution Focused Therapy Motivational Interviewing Relapse Prevention Therapy   Chad CordialLauren Carter, LCSWA 03/24/2015 4:00 PM

## 2015-03-24 NOTE — Progress Notes (Signed)
D: Pt presents with flat affect and depressed mood. Pt reported having self harm thoughts to cut. Pt agreed to hangout in the dayroom during the day. Dr. Jama Flavorsobos and Minerva AreolaEric, [facility] made aware. Pt has been observed in the dayroom this morning and noon-time. No self harm acts committed by pt thus far. MHT's are doing frequent checks and providing support. Writer offered pt antianxiety med. Pt reported that she's able to maintain without prn at this time. Pt c/o acid reflux. Dr. Jama Flavorsobos made aware.  A: Medications administered as ordered per MD. Writer placed verbal order for Protonix per [place]os, MD. Verbal support provided to pt. Pt encouraged to attend groups. 15 minute checks performed for safety. R: Pt receptive to tx.

## 2015-03-24 NOTE — Progress Notes (Signed)
D: Pt has depressed affect and mood.  She reports her day was "rough" and "I found out last night that my brother passed away."  She reports her goal today was to "not let my depression consume me."  Pt reports she has had SI "all day" with a plan to cut.  She reports she does not have anything to cut with and she agreed to notify staff if she felt like she was going to act on her thoughts.  Pt denies HI, reports visual hallucinations of "shadows", denies pain.  Pt has been visible in milieu and she attended evening group.  She was tearful at times.   A: Met with pt 1:1 and provided support and encouragement.  Actively listened to pt.  Medications administered per order.  PRN medication administered for anxiety.  AC, charge nurse, and on-site provider notified that patient was suicidal with a plan and agreed to notify staff if she felt like she was going to act on her plan.   R: Pt is compliant with medications.  Pt verbally contracts for safety.  Will continue to monitor and assess.

## 2015-03-24 NOTE — [facility] Group Notes (Signed)
[facility] Note  03/24/2015 8:45 AM  Participation Quality: Alert, Appropriate and Oriented  Mood/Affect: Flat  Depression Rating: 8  Anxiety Rating: 10  Thoughts of Suicide: Pt endorses SI  Will you contract for safety? "I can tell someone but I can't contract for safety"  Current AVH: Pt denies  Plan for Discharge/Comments: Pt attended discharge planning group and actively participated in group. CSW discussed suicide prevention education with the group and encouraged them to discuss discharge planning and any relevant barriers. Pt continues to present with flat affect and depressed mood; unable to identify causes for continuing depression. Pt identifies no needs at this time.  Transportation Means: Pt reports access to transportation  Supports: No supports mentioned at this time  Chad CordialLauren Carter, LCSWA 03/24/2015 12:06 PM

## 2015-03-24 NOTE — Progress Notes (Signed)
Patient ID: Grace [name], female   DOB: Feb 01, 1986, 29 y.o.   MRN: [id number] [facility] MD Progress Note  03/24/2015 1:33 PM Beryl REAGYN FACEMIRE  MRN:  [id number]  Subjective:  Patient states she continues to feel depressed, sad which she attributes partly to finding out that a brother died recently. Reports some passive thoughts of death, but at this time denies any suicidal plan or intention and is future oriented. She spoke about her plan to go live with an aunt after discharge , who will also let her children live with her.   Objective : Case discussed with treatment team and patient seen. Patient remains depressed, sad, and states she feels she is " not good ". She states her sleep has been " so -so", describes a sense of low energy level, fair appetite. As noted, states she recently found out a brother had died ( she does not know cause of death) , and states this has caused her to feel more depressed . She denies plan or intention of hurting self or of SI on unit. Her behavior has been calm and appropriate, no agitation or restlessness. She is tolerating medications well, denies side effects. She does feel Seroquel is helping her sleep better, but feels she needs higher dose, as sleep still fair. We have reviewed medication  Side effects.   Principal Problem: MDD (major depressive disorder), recurrent episode, moderate (HCC) Diagnosis:   Patient Active Problem List   Diagnosis Date Noted  . Hyperthyroidism [E05.90] 03/21/2015  . Tachycardia [R00.0] 03/21/2015  . Palpitations [R00.2] 03/21/2015  . Chest pain [R07.9] 03/21/2015  . MDD (major depressive disorder), recurrent episode, moderate (HCC) [F33.1] 03/16/2015  . Consultation for female sterilization [Z30.09] 05/09/2013  . NSVD (normal spontaneous vaginal delivery) [O80] 03/30/2013  . Chronic hypertension in pregnancy [O10.919] 03/29/2013  . HTN in pregnancy, chronic [O10.919] 03/28/2013  . Transient hypertension of pregnancy,  antepartum [O13.9] 03/20/2013  . GERD (gastroesophageal reflux disease) [K21.9] 01/18/2013  . Hyperemesis gravidarum before end of [redacted] week gestation with electrolyte imbalance [O21.1] 09/23/2012  . Trichomonas [A59.9] 09/23/2012  . Supervision of high-risk pregnancy [O09.90] 04/19/2011  . History of molar pregnancy, antepartum [O09.A0] 03/22/2011  . Hyperthyroidism, antepartum [N82.956, E07.9] 03/16/2011  . Sickle cell trait (HCC) [D57.3] 12/30/2010   Total Time spent with patient: 25 minutes     Past Medical History:  Past Medical History  Diagnosis Date  . Sickle cell trait (HCC)   . Hyperthyroidism   . Sickle cell trait (HCC)   . Cholecystitis 08/18/2011  . SVD (spontaneous vaginal delivery)     x 3 WH  . Hypertension   . History of recurrent UTIs     recurrent UTIs  . Headache(784.0)     otc med prn  . Joint pain     knees and shoulders - otc med prn    Past Surgical History  Procedure Laterality Date  . Cholecystectomy  08/19/2011    Procedure: LAPAROSCOPIC CHOLECYSTECTOMY WITH INTRAOPERATIVE CHOLANGIOGRAM;  Surgeon: Lodema Pilot, DO;  Location: [facility] OR;  Service: General;  Laterality: N/A;  . Dilation and curettage of uterus      molar preg  . Laparoscopic bilateral salpingectomy Bilateral 05/09/2013    Procedure: LAPAROSCOPIC BILATERAL SALPINGECTOMY;  Surgeon: Allie Bossier, MD;  Location: [facility] ORS;  Service: Gynecology;  Laterality: Bilateral;   Family History:  Family History  Problem Relation Age of Onset  . Diabetes Mother   . Cancer Father   . Diabetes Maternal Uncle   .  Cancer Maternal Grandmother   . Cancer Maternal Grandfather   . Hypertension Maternal Grandfather   . Diabetes Maternal Grandfather   . Sickle cell anemia Son   . Asthma Son   . Heart disease Paternal Grandfather   . Hearing loss Paternal Aunt     Social History:  History  Alcohol Use No     History  Drug Use No    Social History   Social History  . Marital Status: Single    Spouse  Name: N/A  . Number of Children: N/A  . Years of Education: N/A   Social History Main Topics  . Smoking status: Never Smoker   . Smokeless tobacco: Never Used  . Alcohol Use: No  . Drug Use: No  . Sexual Activity: Yes    Birth Control/ Protection: Surgical   Other Topics Concern  . None   Social History Narrative   2 Adults, 4 children at home   FOB involved   Additional Social History:    Pain Medications: Denies abuse Prescriptions: Denies abuse Over the Counter: Denies abuse History of alcohol / drug use?: No history of alcohol / drug abuse   Sleep:  Fair   Appetite:  Good  Current Medications: Current Facility-Administered Medications  Medication Dose Route Frequency Provider Last Rate Last Dose  . acetaminophen (TYLENOL) tablet 650 mg  650 mg Oral Q4H PRN Thermon LeylandLaura A Davis, NP      . alum & mag hydroxide-simeth (MAALOX/MYLANTA) 200-200-20 MG/5ML suspension 30 mL  30 mL Oral PRN Thermon LeylandLaura A Davis, NP   30 mL at 03/24/15 1254  . citalopram (CELEXA) tablet 20 mg  20 mg Oral Daily Craige CottaFernando A Jemarcus Dougal, MD   20 mg at 03/24/15 0802  . hydrOXYzine (ATARAX/VISTARIL) tablet 25 mg  25 mg Oral Q6H PRN Thermon LeylandLaura A Davis, NP   25 mg at 03/23/15 2210  . ibuprofen (ADVIL,MOTRIN) tablet 600 mg  600 mg Oral Q8H PRN Thermon LeylandLaura A Davis, NP   600 mg at 03/19/15 2207  . LORazepam (ATIVAN) tablet 1 mg  1 mg Oral Q8H PRN Thermon LeylandLaura A Davis, NP   1 mg at 03/23/15 2105  . methimazole (TAPAZOLE) tablet 20 mg  20 mg Oral BID Thermon LeylandLaura A Davis, NP   20 mg at 03/24/15 0802  . metoprolol tartrate (LOPRESSOR) tablet 12.5 mg  12.5 mg Oral BID Maryruth Bunhristina P Rama, MD   12.5 mg at 03/24/15 0802  . ondansetron (ZOFRAN) tablet 4 mg  4 mg Oral Q8H PRN Thermon LeylandLaura A Davis, NP      . pantoprazole (PROTONIX) EC tablet 40 mg  40 mg Oral Daily Rockey SituFernando A Manaia Samad, MD      . QUEtiapine (SEROQUEL) tablet 50 mg  50 mg Oral QHS Craige CottaFernando A Addley Ballinger, MD        Lab Results:  No results found for this or any previous visit (from the past 48  hour(s)).  Physical Findings: AIMS: Facial and Oral Movements Muscles of Facial Expression: None, normal Lips and Perioral Area: None, normal Jaw: None, normal Tongue: None, normal,Extremity Movements Upper (arms, wrists, hands, fingers): None, normal Lower (legs, knees, ankles, toes): None, normal, Trunk Movements Neck, shoulders, hips: None, normal, Overall Severity Severity of abnormal movements (highest score from questions above): None, normal Incapacitation due to abnormal movements: None, normal Patient's awareness of abnormal movements (rate only patient's report): No Awareness, Dental Status Current problems with teeth and/or dentures?: No Does patient usually wear dentures?: No  CIWA:    COWS:  Musculoskeletal: Strength & Muscle Tone: within normal limits Gait & Station: normal Patient leans: N/A  Psychiatric Specialty Exam: Review of Systems  Psychiatric/Behavioral: Positive for depression and suicidal ideas. The patient is nervous/anxious (stable) and has insomnia.   All other systems reviewed and are negative.  denies chest pain, no SOB, (+) palpitations, no vomiting   Blood pressure 107/70, pulse 97, temperature 98.7 F (37.1 C), temperature source Oral, resp. rate 16, height  (1.626 m), weight 151 lb (68.493 kg), last menstrual period 02/18/2015, SpO2 99 %, not currently breastfeeding.Body mass index is 25.91 kg/(m^2).  General Appearance: Fairly Groomed  Patent attorney::  Good  Speech:  Normal Rate  Volume:  Normal  Mood: depressed   Affect:  Constricted but does smile at times appropriately  Thought Process:  Linear  Orientation:  Full (Time, Place, and Person)  Thought Content:  denies hallucinations, no delusions, not internally preoccupied   Suicidal Thoughts:  Denies any plan or intention of hurting self or of SI.   Homicidal Thoughts:  No  Memory:  recent and remote grossly intact   Judgement:  Fair  Insight:  Fair  Psychomotor Activity:   Normal- no restlessness or agitation  Concentration:  Good  Recall:  Good  Fund of Knowledge:Good  Language: Good  Akathisia:  Negative  Handed:  Right  AIMS (if indicated):     Assets:  Communication Skills Desire for Improvement Resilience  ADL's:  Intact  Cognition: WNL  Sleep:  Number of Hours: 6   Assessment - patient reports increased depression, due partly to finding out that a brother has died . She has reported some passive SI, but at this time denies any plan or intention of hurting self or of SI and is future oriented, focusing also on discharge planning and stating she plans to move in with an aunt . Denies medication side effects , is tolerating Celexa well thus far. She is requesting to increase Seroquel dose to help address ongoing insomnia .   Treatment Plan Summary: Daily contact with patient to assess and evaluate symptoms and progress in treatment, Medication management, Plan inpatient admission and medications as below Continue to encourage group participation, milieu, to work on coping skills and symptom reduction Continue Tapazole for history of hyperthyroidism Continue Metoprolol to address tachycardia, palpitations, felt to be related to hyperthyroidism. Increase Seroquel to 50 mgrs QHS for insomnia and  Night time anxiety Continue Celexa   20  Mg QDAY for depression and anxiety Continue Ativan 1 mgr Q 8 hours PRN for severe anxiety, as needed   Tiasha Helvie  MD  03/24/2015, 1:33 PM

## 2015-03-25 MED ORDER — MEGESTROL ACETATE 20 MG PO TABS
10.0000 mg | ORAL_TABLET | Freq: Every day | ORAL | Status: DC
  Administered 2015-03-25 – 2015-03-26 (×2): 10 mg via ORAL
  Filled 2015-03-25 (×3): qty 0.5

## 2015-03-25 NOTE — Progress Notes (Signed)
D: Pt presents with flat affect and depressed mood. Pt rates depression 10/10. Hopeless 9/10. Anxiety 9/10. Pt endorses suicidal thoughts. Pt endorses thoughts to cut. Pt verbally contracts not to harm self. Pt agreed to come talk to staff before acting out on those thoughts. Pt reports fair sleep and reports up and down during the night. Pt requesting to be started on home med or Megestrol for endometriosis and increase bleeding. MD and Pharm D aware. Pt compliant with taking meds and attending groups. A: medications administered as ordered per MD. Home med supply placed backed in pt locker. Verbal support given. Pt encouraged to attend groups. 15 minute checks performed for safety. R: Pt receptive to tx.

## 2015-03-25 NOTE — Progress Notes (Signed)
   D: Writer observed pt in the dayroom interacting with her peers. Writer asked pt if she had any questions or concerns that hadn't been addressed. Pt stated, "no". When asked about the plans she and her dr had devised for discharge pt stated, "[facility] Then pt adds, "I'm trying to contract for safety". Writer noted that pt's affect was not congruent with what she was saying.  However, writer explained that if pt didn't feel safe than she needs to alert staff. Pt has no other questions or concerns.   A:  Support and encouragement was offered. 15 min checks continued for safety.  R: Pt remains safe.

## 2015-03-25 NOTE — [facility] Group Notes (Signed)
[facility] LCSW Group Therapy 03/25/2015 1:15 PM  Type of Therapy: Group Therapy- Feelings about Diagnosis  Participation Level: Minimal  Participation Quality:  Reserved  Affect:  Appropriate  Cognitive: Alert and Oriented   Insight:  Developing   Engagement in Therapy: Developing/Improving and Engaged   Modes of Intervention: Clarification, Confrontation, Discussion, Education, Exploration, Limit-setting, Orientation, Problem-solving, Rapport Building, Dance movement psychotherapisteality Testing, Socialization and Support  Description of Group:   This group will allow patients to explore their thoughts and feelings about diagnoses they have received. Patients will be guided to explore their level of understanding and acceptance of these diagnoses. Facilitator will encourage patients to process their thoughts and feelings about the reactions of others to their diagnosis, and will guide patients in identifying ways to discuss their diagnosis with significant others in their lives. This group will be process-oriented, with patients participating in exploration of their own experiences as well as giving and receiving support and challenge from other group members.  Summary of Progress/Problems:  Pt continues to be reserved in group discussion. She discussed how she is unsure of what her diagnosis is and would like to talk to the doctor about this. She explains that her family is often invalidating of her mental illness and tell her to "get over it." She expresses that she would like for them to say, "I'm going to be here for you, I'm sorry this is hard for you."  Therapeutic Modalities:   Cognitive Behavioral Therapy Solution Focused Therapy Motivational Interviewing Relapse Prevention Therapy  Chad CordialLauren Carter, LCSWA 03/25/2015 3:05 PM

## 2015-03-25 NOTE — Progress Notes (Signed)
Adult Psychoeducational Group Note  Date:  03/25/2015 Time:  9:14 PM  Group Topic/Focus:  Wrap-Up Group:   The focus of this group is to help patients review their daily goal of treatment and discuss progress on daily workbooks.  Participation Level:  Active  Participation Quality:  Appropriate  Affect:  Angry and Appropriate  Cognitive:  Alert  Insight: Appropriate  Engagement in Group:  Engaged  Modes of Intervention:  Discussion  Additional Comments:  Patient stated having an okay day. Patient stated something positive that happened today was her getting extra sleep  Toleen Lachapelle L Atzin Buchta 03/25/2015, 9:14 PM

## 2015-03-25 NOTE — Progress Notes (Signed)
Recreation Therapy Notes  Animal-Assisted Activity (AAA) Program Checklist/Progress Notes Patient Eligibility Criteria Checklist & Daily Group note for Rec Tx Intervention  Date: 12.06.2016 Time: 2:45pm Location: [address] Dayroom    AAA/T Program Assumption of Risk Form signed by Patient/ or Parent Legal Guardian yes  Patient is free of allergies or sever asthma yes  Patient reports no fear of animals yes  Patient reports no history of cruelty to animals yes  Patient understands his/her participation is voluntary yes  Behavioral Response: Did not attend.   Grace [name], LRT/CTRS       Grace [name] L 03/25/2015 3:05 PM 

## 2015-03-25 NOTE — [facility] Group Notes (Signed)
[facility] Group Notes:  (Nursing/MHT/Case Management/Adjunct)  Date:  03/25/2015  Time:  0845  Type of Therapy:  Nurse Education  Participation Level:  Active  Participation Quality:  Appropriate  Affect:  Appropriate  Cognitive:  Appropriate  Insight:  Appropriate  Engagement in Group:  Engaged  Modes of Intervention:  Orientation  Summary of Progress/Problems:  Layla BarterWhite, Jaan Fischel L 03/25/2015, 10:53 AM
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
## 2015-03-26 MED ORDER — MIRTAZAPINE 7.5 MG PO TABS
7.5000 mg | ORAL_TABLET | Freq: Every day | ORAL | Status: DC
  Filled 2015-03-26: qty 1

## 2015-03-26 MED ORDER — MEGESTROL ACETATE 20 MG PO TABS: 10.0000 mg | ORAL_TABLET | Freq: Every day | ORAL | Status: DC

## 2015-03-26 MED ORDER — IBUPROFEN 600 MG PO TABS
600.0000 mg | ORAL_TABLET | Freq: Three times a day (TID) | ORAL | Status: DC | PRN
Start: 2015-03-26 — End: 2015-04-01
  Administered 2015-03-28 – 2015-03-30 (×3): 600 mg via ORAL
  Filled 2015-03-26 (×4): qty 1

## 2015-03-26 MED ORDER — HYDROXYZINE HCL 25 MG PO TABS
25.0000 mg | ORAL_TABLET | Freq: Four times a day (QID) | ORAL | Status: DC | PRN
  Administered 2015-03-26 – 2015-03-31 (×6): 25 mg via ORAL
  Filled 2015-03-26: qty 10
  Filled 2015-03-26 (×7): qty 1

## 2015-03-26 MED ORDER — ALUM & MAG HYDROXIDE-SIMETH 200-200-20 MG/5ML PO SUSP: 30.0000 mL | ORAL | Status: DC | PRN

## 2015-03-26 MED ORDER — ONDANSETRON HCL 4 MG PO TABS
4.0000 mg | ORAL_TABLET | Freq: Three times a day (TID) | ORAL | Status: DC | PRN
  Administered 2015-03-27 – 2015-03-28 (×2): 4 mg via ORAL
  Filled 2015-03-26 (×3): qty 1

## 2015-03-26 MED ORDER — MIRTAZAPINE 15 MG PO TABS
15.0000 mg | ORAL_TABLET | Freq: Every day | ORAL | Status: DC
  Administered 2015-03-26 – 2015-03-27 (×2): 15 mg via ORAL
  Filled 2015-03-26 (×5): qty 1

## 2015-03-26 MED ORDER — METOPROLOL TARTRATE 25 MG PO TABS
12.5000 mg | ORAL_TABLET | Freq: Two times a day (BID) | ORAL | Status: DC
  Administered 2015-03-26 – 2015-04-01 (×12): 12.5 mg via ORAL
  Filled 2015-03-26 (×9): qty 0.5
  Filled 2015-03-26: qty 7
  Filled 2015-03-26: qty 0.5
  Filled 2015-03-26: qty 7
  Filled 2015-03-26 (×4): qty 0.5

## 2015-03-26 MED ORDER — QUETIAPINE FUMARATE 50 MG PO TABS: 50.0000 mg | ORAL_TABLET | Freq: Every day | ORAL | Status: DC

## 2015-03-26 MED ORDER — LORAZEPAM 1 MG PO TABS
1.0000 mg | ORAL_TABLET | Freq: Three times a day (TID) | ORAL | Status: DC | PRN
  Administered 2015-03-26 – 2015-04-01 (×12): 1 mg via ORAL
  Filled 2015-03-26 (×12): qty 1

## 2015-03-26 MED ORDER — MIRTAZAPINE 7.5 MG PO TABS: 7.5000 mg | ORAL_TABLET | Freq: Every day | ORAL | Status: DC

## 2015-03-26 MED ORDER — ACETAMINOPHEN 325 MG PO TABS
650.0000 mg | ORAL_TABLET | ORAL | Status: DC | PRN
  Administered 2015-03-31 – 2015-04-01 (×2): 650 mg via ORAL
  Filled 2015-03-26 (×2): qty 2

## 2015-03-26 MED ORDER — PANTOPRAZOLE SODIUM 40 MG PO TBEC
40.0000 mg | DELAYED_RELEASE_TABLET | Freq: Every day | ORAL | Status: DC
  Administered 2015-03-27 – 2015-04-01 (×6): 40 mg via ORAL
  Filled 2015-03-26 (×7): qty 1
  Filled 2015-03-26: qty 7
  Filled 2015-03-26: qty 1

## 2015-03-26 MED ORDER — METHIMAZOLE 10 MG PO TABS
20.0000 mg | ORAL_TABLET | Freq: Two times a day (BID) | ORAL | Status: DC
Start: 2015-03-26 — End: 2015-04-01
  Administered 2015-03-26 – 2015-04-01 (×12): 20 mg via ORAL
  Filled 2015-03-26: qty 2
  Filled 2015-03-26: qty 12
  Filled 2015-03-26 (×8): qty 2
  Filled 2015-03-26: qty 12
  Filled 2015-03-26 (×5): qty 2

## 2015-03-26 MED ORDER — CITALOPRAM HYDROBROMIDE 20 MG PO TABS
20.0000 mg | ORAL_TABLET | Freq: Every day | ORAL | Status: DC
Start: 2015-03-26 — End: 2015-03-27
  Administered 2015-03-27: 20 mg via ORAL
  Filled 2015-03-26 (×4): qty 1

## 2015-03-26 NOTE — Progress Notes (Signed)
D: Pt presents with flat affect and depressed mood. Pt rates depression 10/10. Hopeless 9/10. Anxiety 10/10. Pt endorses suicidal thoughts. Pt endorses thoughts to cut. Pt verbally contracts for safety. Pt agreed to no self harm behaviors today. Pt reports difficulty sleeping at bedtime. Pt stated goal is to work on coping with depression.  A: Medications administered as ordered per MD. Verbal support given. Pt encouraged to attend groups. 15 minute checks performed for safety.  R: Pt receptive to tx. Pt safety maintained.
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
## 2015-03-26 NOTE — Progress Notes (Signed)
  D: Pt stated the "first part of the day was good, but as the middle of the day got down and depression."  Pt was flat and depressed, however observed interacting with her peers in the dayroom.   A: Writer encouraged to keep a journal. Support and encouragement was offered. 15 min checks continued for safety.  R: Pt remains safe.

## 2015-03-26 NOTE — Tx Team (Signed)
Interdisciplinary Treatment Plan Update (Adult) Date: 03/26/2015   Date: 03/26/2015 8:47 AM  Progress in Treatment:  Attending groups: Yes  Participating in groups: Yes, minimally  Taking medication as prescribed: Yes  Tolerating medication: Yes  Family/Significant othe contact made: No, Pt declines Patient understands diagnosis: Yes AEB seeking help with depression Discussing patient identified problems/goals with staff: Yes  Medical problems stabilized or resolved: Yes  Denies suicidal/homicidal ideation: No, endorses passive SI Patient has not harmed self or Others: Yes   New problem(s) identified: None identified at this time.   Discharge Plan or Barriers: Pt will return home and follow-up with [facility] [place]  Additional comments:  Patient and CSW reviewed pt's identified goals and treatment plan. Patient verbalized understanding and agreed to treatment plan. CSW reviewed [facility] "Discharge Process and Patient Involvement" Form. Pt verbalized understanding of information provided and signed form.   Reason for Continuation of Hospitalization:  Depression Medication stabilization Suicidal ideation  Estimated length of stay: 1-2 days  Review of initial/current patient goals per problem list:   1.  Goal(s): Patient will participate in aftercare plan  Met:  Yes  Target date: 3-5 days from date of admission   As evidenced by: Patient will participate within aftercare plan AEB aftercare provider and housing plan at discharge being identified.   03/18/15: Pt will discharge home and follow-up with [facility] [place]  2.  Goal (s): Patient will exhibit decreased depressive symptoms and suicidal ideations.  Met:  No  Target date: 3-5 days from date of admission   As evidenced by: Patient will utilize self rating of depression at 3 or below and demonstrate decreased signs of depression or be deemed stable for discharge by MD. 03/18/15: Pt was admitted  with symptoms of depression, rating 10/10. Pt continues to present with flat affect and depressive symptoms.  Pt will demonstrate decreased symptoms of depression and rate depression at 3/10 or lower prior to discharge. 03/21/15: Pt rates depression at 7/10; denies SI 03/26/15: Pt continues to rate depression highly at 9/10; endorses passive SI and thoughts to self-harm.   Attendees:  Patient:    Family:    Physician: Dr. Parke Poisson, MD  03/26/2015 8:47 AM  Nursing: Lars Pinks, RN Case manager  03/26/2015 8:47 AM  Clinical Social Worker Peri Maris, [place] 03/26/2015 8:47 AM  Other:  03/26/2015 8:47 AM  Clinical: Darrol Angel, RN  03/26/2015 8:47 AM  Other: , RN Charge Nurse 03/26/2015 8:47 AM  Other: Hilda Lias, [place], [place] Social Work [phone]

## 2015-03-26 NOTE — Plan of Care (Signed)
Problem: Diagnosis: Increased Risk For Suicide Attempt Goal: STG-Patient Will Comply With Medication Regime Outcome: Progressing Pt compliant with taking meds.

## 2015-03-26 NOTE — Plan of Care (Signed)
Problem: Alteration in mood Goal: STG-Patient reports thoughts of self-harm to staff Outcome: Progressing Pt reported suicidal thoughts and thoughts of cutting to Clinical research associatewriter. Pt verbally contracts not to harm self.

## 2015-03-26 NOTE — [facility] Group Notes (Signed)
[facility]  03/26/2015 8:45 AM  Participation Quality: Alert, Appropriate and Oriented  Mood/Affect: Flat, Withdrawn  Depression Rating: 8  Anxiety Rating: 7  Thoughts of Suicide: Pt endorses passive SI  Will you contract for safety? Yes  Current AVH: Pt denies  Plan for Discharge/Comments: Pt attended discharge planning group and actively participated in group. CSW discussed suicide prevention education with the group and encouraged them to discuss discharge planning and any relevant barriers. Pt reports that she still feels depressed. Observed to have minimal eye contact and participation in group.  Transportation Means: Pt reports access to transportation  Supports: No supports mentioned at this time  Chad CordialLauren Carter, LCSWA 03/26/2015 10:55 AM

## 2015-03-27 MED ORDER — BUPROPION HCL ER (XL) 150 MG PO TB24
150.0000 mg | ORAL_TABLET | Freq: Every day | ORAL | Status: DC
  Administered 2015-03-27 – 2015-04-01 (×6): 150 mg via ORAL
  Filled 2015-03-27 (×4): qty 1
  Filled 2015-03-27: qty 7
  Filled 2015-03-27 (×4): qty 1

## 2015-03-27 NOTE — Progress Notes (Signed)
Patient ID: Grace [name], female   DOB: Apr 05, 1986, 29 y.o.   MRN: [id number] [facility] MD Progress Note  03/27/2015 12:44 PM Grace [name]  MRN:  [id number]  Subjective:  She reports ongoing depression, related to her severe psychosocial stressors. Patient reports ongoing suicidal ideations, ruminations. She denies plan or intention of suicide on unit, but states "  I would not be safe " if discharged at this time. She states she scratched herself on forearm yesterday due to anxiety, depression- no scar or mark visible. Tolerating medications well. Reports she remains depressed, and even some worsening, in spite of CELEXA trial. We discussed other options and agrees to [facility] XL trial.      Objective : Case discussed with treatment team and patient seen.  Patient endorses ongoing depression, sadness, and presents mildly dysphoric and irritable. As noted, she has continued to report suicidal ruminations. At this time describes passive thoughts and is able to contract for safety. Depression seems to be significantly related to her psychosocial stressors, and feeling overwhelmed when thinking of returning to them after discharge. We spoke about possible option of going to [facility] after discharge, and patient seemed quite interested in this, as it would offer her a change of environment and to be more removed from her stressors locally. After this review of options, her affect seemed to improve. As noted, patient does not feel Celexa is helping, and is interested in another antidepressant trial.   Principal Problem: MDD (major depressive disorder), recurrent episode, moderate (HCC) Diagnosis:   Patient Active Problem List   Diagnosis Date Noted  . Hyperthyroidism [E05.90] 03/21/2015  . Tachycardia [R00.0] 03/21/2015  . Palpitations [R00.2] 03/21/2015  . Chest pain [R07.9] 03/21/2015  . MDD (major depressive disorder), recurrent episode, moderate (HCC) [F33.1]  03/16/2015  . Consultation for female sterilization [Z30.09] 05/09/2013  . NSVD (normal spontaneous vaginal delivery) [O80] 03/30/2013  . Chronic hypertension in pregnancy [O10.919] 03/29/2013  . HTN in pregnancy, chronic [O10.919] 03/28/2013  . Transient hypertension of pregnancy, antepartum [O13.9] 03/20/2013  . GERD (gastroesophageal reflux disease) [K21.9] 01/18/2013  . Hyperemesis gravidarum before end of [redacted] week gestation with electrolyte imbalance [O21.1] 09/23/2012  . Trichomonas [A59.9] 09/23/2012  . Supervision of high-risk pregnancy [O09.90] 04/19/2011  . History of molar pregnancy, antepartum [O09.A0] 03/22/2011  . Hyperthyroidism, antepartum [N82.956, E07.9] 03/16/2011  . Sickle cell trait (HCC) [D57.3] 12/30/2010   Total Time spent with patient: 25 minutes     Past Medical History:  Past Medical History  Diagnosis Date  . Sickle cell trait (HCC)   . Hyperthyroidism   . Sickle cell trait (HCC)   . Cholecystitis 08/18/2011  . SVD (spontaneous vaginal delivery)     x 3 WH  . Hypertension   . History of recurrent UTIs     recurrent UTIs  . Headache(784.0)     otc med prn  . Joint pain     knees and shoulders - otc med prn    Past Surgical History  Procedure Laterality Date  . Cholecystectomy  08/19/2011    Procedure: LAPAROSCOPIC CHOLECYSTECTOMY WITH INTRAOPERATIVE CHOLANGIOGRAM;  Surgeon: Lodema Pilot, DO;  Location: [facility] OR;  Service: General;  Laterality: N/A;  . Dilation and curettage of uterus      molar preg  . Laparoscopic bilateral salpingectomy Bilateral 05/09/2013    Procedure: LAPAROSCOPIC BILATERAL SALPINGECTOMY;  Surgeon: Allie Bossier, MD;  Location: [facility] ORS;  Service: Gynecology;  Laterality: Bilateral;   Family History:  Family History  Problem Relation Age of Onset  . Diabetes Mother   . Cancer Father   . Diabetes Maternal Uncle   . Cancer Maternal Grandmother   . Cancer Maternal Grandfather   . Hypertension Maternal Grandfather   . Diabetes  Maternal Grandfather   . Sickle cell anemia Son   . Asthma Son   . Heart disease Paternal Grandfather   . Hearing loss Paternal Aunt     Social History:  History  Alcohol Use No     History  Drug Use No    Social History   Social History  . Marital Status: Single    Spouse Name: N/A  . Number of Children: N/A  . Years of Education: N/A   Social History Main Topics  . Smoking status: Never Smoker   . Smokeless tobacco: Never Used  . Alcohol Use: No  . Drug Use: No  . Sexual Activity: Yes    Birth Control/ Protection: Surgical   Other Topics Concern  . None   Social History Narrative   2 Adults, 4 children at home   FOB involved   Additional Social History:    Pain Medications: Denies abuse Prescriptions: Denies abuse Over the Counter: Denies abuse History of alcohol / drug use?: No history of alcohol / drug abuse   Sleep:  Has improved- describes some nightmares   Appetite:  Good  Current Medications: Current Facility-Administered Medications  Medication Dose Route Frequency Provider Last Rate Last Dose  . acetaminophen (TYLENOL) tablet 650 mg  650 mg Oral Q4H PRN Craige CottaFernando A Loralyn Rachel, MD      . alum & mag hydroxide-simeth (MAALOX/MYLANTA) 200-200-20 MG/5ML suspension 30 mL  30 mL Oral PRN Rockey SituFernando A Shabree Tebbetts, MD      . citalopram (CELEXA) tablet 20 mg  20 mg Oral Daily Rockey SituFernando A Kesha Hurrell, MD   20 mg at 03/27/15 0818  . hydrOXYzine (ATARAX/VISTARIL) tablet 25 mg  25 mg Oral Q6H PRN Craige CottaFernando A Wilfrido Luedke, MD   25 mg at 03/26/15 2157  . ibuprofen (ADVIL,MOTRIN) tablet 600 mg  600 mg Oral Q8H PRN Craige CottaFernando A Teneka Malmberg, MD      . LORazepam (ATIVAN) tablet 1 mg  1 mg Oral Q8H PRN Craige CottaFernando A Nero Sawatzky, MD   1 mg at 03/27/15 1204  . methimazole (TAPAZOLE) tablet 20 mg  20 mg Oral BID Craige CottaFernando A Elayna Tobler, MD   20 mg at 03/27/15 0818  . metoprolol tartrate (LOPRESSOR) tablet 12.5 mg  12.5 mg Oral BID Craige CottaFernando A Saliha Salts, MD   12.5 mg at 03/27/15 0818  . mirtazapine (REMERON) tablet 15 mg  15 mg  Oral QHS Craige CottaFernando A Alonte Wulff, MD   15 mg at 03/26/15 2157  . ondansetron (ZOFRAN) tablet 4 mg  4 mg Oral Q8H PRN Craige CottaFernando A Zavian Slowey, MD      . pantoprazole (PROTONIX) EC tablet 40 mg  40 mg Oral Daily Craige CottaFernando A Kristoph Sattler, MD   40 mg at 03/27/15 0818    Lab Results:  No results found for this or any previous visit (from the past 48 hour(s)).  Physical Findings: AIMS: Facial and Oral Movements Muscles of Facial Expression: None, normal Lips and Perioral Area: None, normal Jaw: None, normal Tongue: None, normal,Extremity Movements Upper (arms, wrists, hands, fingers): None, normal Lower (legs, knees, ankles, toes): None, normal, Trunk Movements Neck, shoulders, hips: None, normal, Overall Severity Severity of abnormal movements (highest score from questions above): None, normal Incapacitation due to abnormal movements: None, normal Patient's awareness of abnormal movements (  rate only patient's report): No Awareness, Dental Status Current problems with teeth and/or dentures?: No Does patient usually wear dentures?: No  CIWA:    COWS:     Musculoskeletal: Strength & Muscle Tone: within normal limits Gait & Station: normal Patient leans: N/A  Psychiatric Specialty Exam: Review of Systems  Psychiatric/Behavioral: Positive for depression and suicidal ideas. The patient is nervous/anxious (stable) and has insomnia.   All other systems reviewed and are negative.  denies chest pain, no SOB, (+) palpitations, no vomiting   Blood pressure 118/84, pulse 102, temperature 98.5 F (36.9 C), temperature source Oral, resp. rate 16, height  (1.626 m), weight 151 lb (68.493 kg), last menstrual period 02/18/2015, SpO2 99 %, not currently breastfeeding.Body mass index is 25.91 kg/(m^2).  General Appearance: Fairly Groomed  Patent attorney::  Good  Speech:  Normal Rate  Volume:  Normal  Mood:  Depressed   Affect:  Constricted, vaguely irritable, affect improved during session  Thought Process:  Linear   Orientation:  Full (Time, Place, and Person)  Thought Content:  denies hallucinations, no delusions, not internally preoccupied   Suicidal Thoughts: Continues to describe passive SI, but contracts for safety on unit , denies any plan or intention of suicide at this time. Reiterates she would not feel safe to discharge at this time- see above   Homicidal Thoughts:  No  Memory:  recent and remote grossly intact   Judgement:  Fair  Insight:  Fair  Psychomotor Activity:  Normal- no restlessness or agitation  Concentration:  Good  Recall:  Good  Fund of Knowledge:Good  Language: Good  Akathisia:  Negative  Handed:  Right  AIMS (if indicated):     Assets:  Communication Skills Desire for Improvement Resilience  ADL's:  Intact  Cognition: WNL  Sleep:  Number of Hours: 4.75   Assessment -  Patient  Continues to present depressed, constricted, vaguely irritable. She continues to feel unready for and overwhelmed by the prospect of discharging from unit. She states would not feel safe to discharge at this time. This appears to be significantly related to her psychosocial stressors, related to family dynamics, relationship issues with her children's father. She seemed genuinely interested in and excited about discussing discharging to [facility], as it would afford being further removed from her local stressors.  Due to poor response to Celexa at this time we reviewed medications - she is wanting to change to another antidepressant, agrees to Wellbutrin XL trial. Of note, has no history of seizures or of eating disorder. Has history of hyperthyroidism but currently stable on Tapazole     Treatment Plan Summary: Daily contact with patient to assess and evaluate symptoms and progress in treatment, Medication management, Plan inpatient admission and medications as below Continue to encourage group participation, milieu, to work on coping skills and symptom reduction Continue Tapazole for  history of hyperthyroidism Continue Metoprolol to address tachycardia, palpitations, felt to be related to hyperthyroidism. D/c Celexa  Start Wellbutrin XL 150 mgrs QAM for depression Continue Remeron 15  mgrs QHS to address depression, insomnia, anxiety Continue Ativan 1 mgr Q 8 hours PRN for severe anxiety, as needed  Treatment team working on disposition options, patient currently considering going to [facility] after discharge- see above .  Nehemiah Massed  MD  03/27/2015, 12:44 PM

## 2015-03-27 NOTE — Progress Notes (Signed)
Patient remains flat and depressed. States she has passive SI and is reluctant to contract but states she won't seriously harm herself on unit. States, "I scratched myself last night." When asked by this writer if it relieved stress and depression patient states, "no, it sure didn't.  Guess there's no point in it?" Patient praised for insight. Encouraged strongly to employ coping skills and to continue communicating with staff. Medicated per orders. Emotional support offered. Patient denies HI. Denies pain. Did report nausea after dinner and was medicated with zofran. Will reassess. Patient remains safe on level III obs. Will continue to monitor closely. Lawrence MarseillesFriedman, Antoneo Ghrist Eakes

## 2015-03-27 NOTE — Progress Notes (Signed)
  D: Pt asked the writer for ativan, and informed she was still having thoughts of harming herself. Writer encouraged pt to get more rest. Informed that the ativan would help with her anxiety. Pt has no other questions or concerns.   A:  Support and encouragement was offered. 15 min checks continued for safety.  R: Pt remains safe.

## 2015-03-27 NOTE — Progress Notes (Signed)
  D: Pt approached the writer early in the shift and informed that she was having "suicidal thoughts". Writer encouraged pt to use the coping skills, she learned in group today.  Also encouraged pt to attend the wrapup group and use it as a distraction. Pt showed her arm to the pt stating, "this is why I can't contract". Pt had a couple of superficial scratches on her left arm. Writer reinforced the need for coping skills.  A: Pt went to group. A:  Support and encouragement was offered. 15 min checks continued for safety.  R: Pt remains safe.

## 2015-03-27 NOTE — [facility] Group Notes (Signed)
[facility] 03/27/2015 1:15pm  Type of Therapy: Mental Health Association Presentation  Participation Level: Active  Participation Quality: Attentive  Affect: Appropriate  Cognitive: Oriented  Insight: Developing/Improving  Engagement in Therapy: Engaged  Modes of Intervention: Discussion, Education and Socialization  Summary of Progress/Problems: Mental Health Association (MHA) Speaker came to talk about his personal journey with substance abuse and addiction. The pt processed ways by which to relate to the speaker. MHA speaker provided handouts and educational information pertaining to groups and services offered by the [facility]. Pt was engaged in speaker's presentation and was receptive to resources provided.    Chad CordialLauren Carter, LCSWA 03/27/2015 2:11 PM

## 2015-03-27 NOTE — [facility] Group Notes (Signed)
[facility] Group Notes:  (Nursing/MHT/Case Management/Adjunct)  Date:  03/27/2015  Time:  0900  Type of Therapy:  Nurse Education  Participation Level:  Did Not Attend  Participation Quality:    Affect:    Cognitive:    Insight:    Engagement in Group:    Modes of Intervention:    Summary of Progress/Problems: Patient was invited to group however elected to remain in bed.  Merian CapronFriedman, Salaam Battershell [facility] 03/27/2015, 0930

## 2015-03-28 MED ORDER — CITALOPRAM HYDROBROMIDE 40 MG PO TABS
40.0000 mg | ORAL_TABLET | Freq: Every day | ORAL | Status: DC
  Administered 2015-03-28 – 2015-04-01 (×5): 40 mg via ORAL
  Filled 2015-03-28 (×3): qty 1
  Filled 2015-03-28: qty 7
  Filled 2015-03-28: qty 1
  Filled 2015-03-28: qty 2
  Filled 2015-03-28 (×2): qty 1

## 2015-03-28 MED ORDER — QUETIAPINE FUMARATE 50 MG PO TABS
50.0000 mg | ORAL_TABLET | Freq: Every day | ORAL | Status: DC
  Administered 2015-03-28 – 2015-03-30 (×3): 50 mg via ORAL
  Filled 2015-03-28 (×6): qty 1

## 2015-03-28 NOTE — Progress Notes (Signed)
D: Pt has anxious affect and mood.  She complains of agitation, depression, thoughts of self-harm, and anxiety.  Pt reports SI with a plan to cut, denies HI, reports visual hallucinations of "shadows," reports auditory hallucinations stating "it's sounding like I'm hearing my name and nobody is there," denies pain.  She reports she plans to go to the evening group tonight.  Pt has been visible in milieu interacting with peers and staff appropriately.    A: Introduced self to pt.  Met with pt 1:1 and offered support and encouragement.  Actively listened to pt.  Medications administered per order.  PRN medication administered for anxiety. R: Pt is compliant with medications.  Pt verbally contracts for safety.  Will continue to monitor and assess.

## 2015-03-28 NOTE — Progress Notes (Signed)
D.  Pt pleasant but flat affect on approach, denies complaints at this time.  Positive for evening wrap up group, interacting appropriately with peers on the unit,  Denies SI/HI/hallucinations at this time, but did report passive suicidal ideation on dayshift.  A.  Support and encouragement offered, medication given as ordered  R.  Pt remains safe on the unit, will continue to monitor.

## 2015-03-28 NOTE — [facility] Group Notes (Signed)
[facility] LCSW Group Therapy 03/28/2015 1:15pm  Type of Therapy: Group Therapy- Feelings Around Relapse and Recovery  Pt attended group briefly, left early, and did not return.  Modes of Intervention: Clarification, Confrontation, Discussion, Education, Exploration, Limit-setting, Orientation, Problem-solving, Rapport Building, Reality Testing, Socialization and Support   Lamar SprinklesLauren Carter, LCSWA [phone] 03/28/2015 4:56 PM

## 2015-03-28 NOTE — [facility] Group Notes (Signed)
[facility]  03/28/2015 8:45 AM  Pt did not attend, declined invitation.   Chad CordialLauren Carter, LCSWA 03/28/2015 10:11 AM

## 2015-03-28 NOTE — Progress Notes (Signed)
[facility] MD Progress Note  03/28/2015 1:17 PM Grace [name]  MRN:  [id number] Subjective:  Depression might be a little better but endorses increased anxiety. States she is still dealing with the death of her father who died in front of her, also dealing with her and son's illnesses  Specially sickle cell. States is just "mostly  me taking care of everything." states that she has done well on Celexa and she does not understand why it was changed to Wellbutrin and she was not able to sleep so she would like to be able to go back on the Seroquel 50 Principal Problem: MDD (major depressive disorder), recurrent episode, moderate (HCC) Diagnosis:   Patient Active Problem List   Diagnosis Date Noted  . Hyperthyroidism [E05.90] 03/21/2015  . Tachycardia [R00.0] 03/21/2015  . Palpitations [R00.2] 03/21/2015  . Chest pain [R07.9] 03/21/2015  . MDD (major depressive disorder), recurrent episode, moderate (HCC) [F33.1] 03/16/2015  . Consultation for female sterilization [Z30.09] 05/09/2013  . NSVD (normal spontaneous vaginal delivery) [O80] 03/30/2013  . Chronic hypertension in pregnancy [O10.919] 03/29/2013  . HTN in pregnancy, chronic [O10.919] 03/28/2013  . Transient hypertension of pregnancy, antepartum [O13.9] 03/20/2013  . GERD (gastroesophageal reflux disease) [K21.9] 01/18/2013  . Hyperemesis gravidarum before end of [redacted] week gestation with electrolyte imbalance [O21.1] 09/23/2012  . Trichomonas [A59.9] 09/23/2012  . Supervision of high-[name] pregnancy [O09.90] 04/19/2011  . History of molar pregnancy, antepartum [O09.A0] 03/22/2011  . Hyperthyroidism, antepartum [N56.213, E07.9] 03/16/2011  . Sickle cell trait (HCC) [D57.3] 12/30/2010   Total Time spent with patient: 20 minutes  Past Psychiatric History: see admission H and P  Past Medical History:  Past Medical History  Diagnosis Date  . Sickle cell trait (HCC)   . Hyperthyroidism   . Sickle cell trait (HCC)   . Cholecystitis  08/18/2011  . SVD (spontaneous vaginal delivery)     x 3 WH  . Hypertension   . History of recurrent UTIs     recurrent UTIs  . Headache(784.0)     otc med prn  . Joint pain     knees and shoulders - otc med prn    Past Surgical History  Procedure Laterality Date  . Cholecystectomy  08/19/2011    Procedure: LAPAROSCOPIC CHOLECYSTECTOMY WITH INTRAOPERATIVE CHOLANGIOGRAM;  Surgeon: Lodema Pilot, DO;  Location: [facility] OR;  Service: General;  Laterality: N/A;  . Dilation and curettage of uterus      molar preg  . Laparoscopic bilateral salpingectomy Bilateral 05/09/2013    Procedure: LAPAROSCOPIC BILATERAL SALPINGECTOMY;  Surgeon: Allie Bossier, MD;  Location: [facility] ORS;  Service: Gynecology;  Laterality: Bilateral;   Family History:  Family History  Problem Relation Age of Onset  . Diabetes Mother   . Cancer Father   . Diabetes Maternal Uncle   . Cancer Maternal Grandmother   . Cancer Maternal Grandfather   . Hypertension Maternal Grandfather   . Diabetes Maternal Grandfather   . Sickle cell anemia Son   . Asthma Son   . Heart disease Paternal Grandfather   . Hearing loss Paternal Aunt    Family Psychiatric  History: see Admission H and P Social History:  History  Alcohol Use No     History  Drug Use No    Social History   Social History  . Marital Status: Single    Spouse Name: N/A  . Number of Children: N/A  . Years of Education: N/A   Social History Main Topics  . Smoking status:  Never Smoker   . Smokeless tobacco: Never Used  . Alcohol Use: No  . Drug Use: No  . Sexual Activity: Yes    Birth Control/ Protection: Surgical   Other Topics Concern  . None   Social History Narrative   2 Adults, 4 children at home   FOB involved   Additional Social History:    Pain Medications: Denies abuse Prescriptions: Denies abuse Over the Counter: Denies abuse History of alcohol / drug use?: No history of alcohol / drug abuse                    Sleep:  Fair  Appetite:  Fair  Current Medications: Current Facility-Administered Medications  Medication Dose Route Frequency Provider Last Rate Last Dose  . acetaminophen (TYLENOL) tablet 650 mg  650 mg Oral Q4H PRN Craige Cotta, MD      . alum & mag hydroxide-simeth (MAALOX/MYLANTA) 200-200-20 MG/5ML suspension 30 mL  30 mL Oral PRN Rockey Situ Cobos, MD      . buPROPion (WELLBUTRIN XL) 24 hr tablet 150 mg  150 mg Oral Daily Rockey Situ Cobos, MD   150 mg at 03/28/15 0916  . hydrOXYzine (ATARAX/VISTARIL) tablet 25 mg  25 mg Oral Q6H PRN Craige Cotta, MD   25 mg at 03/26/15 2157  . ibuprofen (ADVIL,MOTRIN) tablet 600 mg  600 mg Oral Q8H PRN Craige Cotta, MD      . LORazepam (ATIVAN) tablet 1 mg  1 mg Oral Q8H PRN Craige Cotta, MD   1 mg at 03/28/15 1004  . methimazole (TAPAZOLE) tablet 20 mg  20 mg Oral BID Craige Cotta, MD   20 mg at 03/28/15 0916  . metoprolol tartrate (LOPRESSOR) tablet 12.5 mg  12.5 mg Oral BID Craige Cotta, MD   12.5 mg at 03/28/15 0915  . mirtazapine (REMERON) tablet 15 mg  15 mg Oral QHS Craige Cotta, MD   15 mg at 03/27/15 2102  . ondansetron (ZOFRAN) tablet 4 mg  4 mg Oral Q8H PRN Craige Cotta, MD   4 mg at 03/27/15 1817  . pantoprazole (PROTONIX) EC tablet 40 mg  40 mg Oral Daily Craige Cotta, MD   40 mg at 03/28/15 0915    Lab Results: No results found for this or any previous visit (from the past 48 hour(s)).  Physical Findings: AIMS: Facial and Oral Movements Muscles of Facial Expression: None, normal Lips and Perioral Area: None, normal Jaw: None, normal Tongue: None, normal,Extremity Movements Upper (arms, wrists, hands, fingers): None, normal Lower (legs, knees, ankles, toes): None, normal, Trunk Movements Neck, shoulders, hips: None, normal, Overall Severity Severity of abnormal movements (highest score from questions above): None, normal Incapacitation due to abnormal movements: None, normal Patient's awareness of  abnormal movements (rate only patient's report): No Awareness, Dental Status Current problems with teeth and/or dentures?: No Does patient usually wear dentures?: No  CIWA:    COWS:     Musculoskeletal: Strength & Muscle Tone: within normal limits Gait & Station: normal Patient leans: normal  Psychiatric Specialty Exam: Review of Systems  Constitutional: Negative.   HENT:       Migraine  Eyes: Negative.   Respiratory: Negative.   Cardiovascular: Negative.   Gastrointestinal: Positive for heartburn.  Genitourinary: Negative.   Musculoskeletal: Negative.   Skin: Negative.   Neurological: Positive for headaches.  Endo/Heme/Allergies: Negative.   Psychiatric/Behavioral: Positive for depression. The patient is nervous/anxious and has insomnia.  Blood pressure 107/72, pulse 86, temperature 98.5 F (36.9 C), temperature source Oral, resp. rate 16, height 5\' 4"  (1.626 m), weight 68.493 kg (151 lb), last menstrual period 02/18/2015, SpO2 99 %, not currently breastfeeding.Body mass index is 25.91 kg/(m^2).  General Appearance: Fairly Groomed  Patent attorneyye Contact::  Fair  Speech:  Clear and Coherent  Volume:  Normal  Mood:  Anxious and Depressed  Affect:  Restricted  Thought Process:  Coherent and Goal Directed  Orientation:  Full (Time, Place, and Person)  Thought Content:  symptoms events worries concerns  Suicidal Thoughts:  No  Homicidal Thoughts:  No  Memory:  Immediate;   Fair Recent;   Fair Remote;   Fair  Judgement:  Fair  Insight:  Present  Psychomotor Activity:  Normal  Concentration:  Fair  Recall:  FiservFair  Fund of Knowledge:Fair  Language: Fair  Akathisia:  No  Handed:  Right  AIMS (if indicated):     Assets:  Desire for Improvement Housing  ADL's:  Intact  Cognition: WNL  Sleep:  Number of Hours: 6.75   Treatment Plan Summary: Daily contact with patient to assess and evaluate symptoms and progress in treatment and Medication management Supportive  approach/coping skills Depression; will go ahead and keep the Wellbutrin XL 150 mg and resume the Celexa 40 mg. Wellbutrin could augment the Celexa 40 mg Insomnia; will restore the Seroquel 50 mg HS (EKG WNL) Will work with CBT/mindfulness Beverlie Kurihara A 03/28/2015, 1:17 PM

## 2015-03-28 NOTE — Progress Notes (Signed)
Adult Psychoeducational Group Note  Date:  03/28/2015 Time:  08:00pm Group Topic/Focus:  Wrap-Up Group:   The focus of this group is to help patients review their daily goal of treatment and discuss progress on daily workbooks.  Participation Level:  Active  Participation Quality:  Appropriate and Attentive  Affect:  Appropriate  Cognitive:  Alert and Appropriate  Insight: Appropriate  Engagement in Group:  Engaged  Modes of Intervention:  Discussion  Additional Comments:  Pt was attentive and appropriate during tonight's group discussion. Pt was able to share that today was an ok day and she went to the gym and also has her support system visit.   Bing PlumeScott, Averi Cacioppo D 03/28/2015, 11:25 PM

## 2015-03-28 NOTE — Progress Notes (Signed)
NSG shift assessment. 7a-7p.   D: Pt's affect is blunted and her mood is depressed and anxious. She has complaints of chronic pain in her back and pelvis. She did not attend groups and did not fill out a Self Inventory Sheet.  Her anxiety presented this afternoon as feeling like her heart was going to jump out of her chest. Her radial pulse was difficult to palpate, so it was not bounding. Apical pulse auscultated was 102, RRR. Pt had no complaints of chest pain.  A: Observed pt interacting in group and in the milieu: Support and encouragement offered. Safety maintained with observations every 15 minutes.   R:  Contracts for safety and continues to follow the treatment plan, working on learning new coping skills.

## 2015-03-29 MED ORDER — QUETIAPINE FUMARATE 50 MG PO TABS
50.0000 mg | ORAL_TABLET | Freq: Once | ORAL | Status: AC
  Administered 2015-03-29: 50 mg via ORAL
  Filled 2015-03-29: qty 1

## 2015-03-29 NOTE — Progress Notes (Signed)
[facility] MD Progress Note  03/29/2015 10:25 AM Grace [name]  MRN:  [id number]   Subjective:  States "I feel better from the last time that I seen you, " But I think I need my Seroquel increased."  Objective: Grace [name] is awake, alert and oriented X4 , found attending group session. Denies suicidal or homicidal ideation. Denies auditory or visual hallucination and does not appear to be responding to internal stimuli. Patient interacts well with staff and others. Patient reports she is medication compliant without mediation side effects. Report learning new coping skill to read and pace the halls. States her depression 5/10. Patient states "I feeling better since Ive last seen you last week, But I think I need an increase with my Seroquel. " Reports good appetite other wise and resting well. Patient report she is excited regarding discharge and to see her son. States that today is his birthday. Support, encouragement and reassurance was provided.   Principal Problem: MDD (major depressive disorder), recurrent episode, moderate (HCC) Diagnosis:   Patient Active Problem List   Diagnosis Date Noted  . Hyperthyroidism [E05.90] 03/21/2015  . Tachycardia [R00.0] 03/21/2015  . Palpitations [R00.2] 03/21/2015  . Chest pain [R07.9] 03/21/2015  . MDD (major depressive disorder), recurrent episode, moderate (HCC) [F33.1] 03/16/2015  . Consultation for female sterilization [Z30.09] 05/09/2013  . NSVD (normal spontaneous vaginal delivery) [O80] 03/30/2013  . Chronic hypertension in pregnancy [O10.919] 03/29/2013  . HTN in pregnancy, chronic [O10.919] 03/28/2013  . Transient hypertension of pregnancy, antepartum [O13.9] 03/20/2013  . GERD (gastroesophageal reflux disease) [K21.9] 01/18/2013  . Hyperemesis gravidarum before end of [redacted] week gestation with electrolyte imbalance [O21.1] 09/23/2012  . Trichomonas [A59.9] 09/23/2012  . Supervision of high-risk pregnancy [O09.90] 04/19/2011  . History  of molar pregnancy, antepartum [O09.A0] 03/22/2011  . Hyperthyroidism, antepartum [O96.295, E07.9] 03/16/2011  . Sickle cell trait (HCC) [D57.3] 12/30/2010   Total Time spent with patient: 20 minutes  Past Psychiatric History: see admission H and P  Past Medical History:  Past Medical History  Diagnosis Date  . Sickle cell trait (HCC)   . Hyperthyroidism   . Sickle cell trait (HCC)   . Cholecystitis 08/18/2011  . SVD (spontaneous vaginal delivery)     x 3 WH  . Hypertension   . History of recurrent UTIs     recurrent UTIs  . Headache(784.0)     otc med prn  . Joint pain     knees and shoulders - otc med prn    Past Surgical History  Procedure Laterality Date  . Cholecystectomy  08/19/2011    Procedure: LAPAROSCOPIC CHOLECYSTECTOMY WITH INTRAOPERATIVE CHOLANGIOGRAM;  Surgeon: Lodema Pilot, DO;  Location: [facility] OR;  Service: General;  Laterality: N/A;  . Dilation and curettage of uterus      molar preg  . Laparoscopic bilateral salpingectomy Bilateral 05/09/2013    Procedure: LAPAROSCOPIC BILATERAL SALPINGECTOMY;  Surgeon: Allie Bossier, MD;  Location: [facility] ORS;  Service: Gynecology;  Laterality: Bilateral;   Family History:  Family History  Problem Relation Age of Onset  . Diabetes Mother   . Cancer Father   . Diabetes Maternal Uncle   . Cancer Maternal Grandmother   . Cancer Maternal Grandfather   . Hypertension Maternal Grandfather   . Diabetes Maternal Grandfather   . Sickle cell anemia Son   . Asthma Son   . Heart disease Paternal Grandfather   . Hearing loss Paternal Aunt    Family Psychiatric  History: see Admission H and  P Social History:  History  Alcohol Use No     History  Drug Use No    Social History   Social History  . Marital Status: Single    Spouse Name: N/A  . Number of Children: N/A  . Years of Education: N/A   Social History Main Topics  . Smoking status: Never Smoker   . Smokeless tobacco: Never Used  . Alcohol Use: No  . Drug Use: No  .  Sexual Activity: Yes    Birth Control/ Protection: Surgical   Other Topics Concern  . None   Social History Narrative   2 Adults, 4 children at home   FOB involved   Additional Social History:    Pain Medications: Denies abuse Prescriptions: Denies abuse Over the Counter: Denies abuse History of alcohol / drug use?: No history of alcohol / drug abuse                    Sleep: Fair  Appetite:  Fair  Current Medications: Current Facility-Administered Medications  Medication Dose Route Frequency Provider Last Rate Last Dose  . acetaminophen (TYLENOL) tablet 650 mg  650 mg Oral Q4H PRN Craige Cotta, MD      . alum & mag hydroxide-simeth (MAALOX/MYLANTA) 200-200-20 MG/5ML suspension 30 mL  30 mL Oral PRN Rockey Situ Cobos, MD      . buPROPion (WELLBUTRIN XL) 24 hr tablet 150 mg  150 mg Oral Daily Craige Cotta, MD   150 mg at 03/29/15 0816  . citalopram (CELEXA) tablet 40 mg  40 mg Oral Daily Rachael Fee, MD   40 mg at 03/29/15 0816  . hydrOXYzine (ATARAX/VISTARIL) tablet 25 mg  25 mg Oral Q6H PRN Craige Cotta, MD   25 mg at 03/29/15 1610  . ibuprofen (ADVIL,MOTRIN) tablet 600 mg  600 mg Oral Q8H PRN Craige Cotta, MD   600 mg at 03/28/15 1341  . LORazepam (ATIVAN) tablet 1 mg  1 mg Oral Q8H PRN Craige Cotta, MD   1 mg at 03/28/15 2125  . methimazole (TAPAZOLE) tablet 20 mg  20 mg Oral BID Craige Cotta, MD   20 mg at 03/29/15 0820  . metoprolol tartrate (LOPRESSOR) tablet 12.5 mg  12.5 mg Oral BID Craige Cotta, MD   12.5 mg at 03/29/15 0820  . ondansetron (ZOFRAN) tablet 4 mg  4 mg Oral Q8H PRN Craige Cotta, MD   4 mg at 03/28/15 1341  . pantoprazole (PROTONIX) EC tablet 40 mg  40 mg Oral Daily Craige Cotta, MD   40 mg at 03/29/15 0819  . QUEtiapine (SEROQUEL) tablet 50 mg  50 mg Oral QHS Rachael Fee, MD   50 mg at 03/28/15 2125    Lab Results: No results found for this or any previous visit (from the past 48 hour(s)).  Physical  Findings: AIMS: Facial and Oral Movements Muscles of Facial Expression: None, normal Lips and Perioral Area: None, normal Jaw: None, normal Tongue: None, normal,Extremity Movements Upper (arms, wrists, hands, fingers): None, normal Lower (legs, knees, ankles, toes): None, normal, Trunk Movements Neck, shoulders, hips: None, normal, Overall Severity Severity of abnormal movements (highest score from questions above): None, normal Incapacitation due to abnormal movements: None, normal Patient's awareness of abnormal movements (rate only patient's report): No Awareness, Dental Status Current problems with teeth and/or dentures?: No Does patient usually wear dentures?: No  CIWA:    COWS:  Musculoskeletal: Strength & Muscle Tone: within normal limits Gait & Station: normal Patient leans: normal  Psychiatric Specialty Exam: Review of Systems  Constitutional: Negative.   HENT:       Migraine  Eyes: Negative.   Respiratory: Negative.   Cardiovascular: Negative.   Genitourinary: Negative.   Musculoskeletal: Negative.   Skin: Negative.   Endo/Heme/Allergies: Negative.   Psychiatric/Behavioral: Positive for depression. Hallucinations: reports hearing voices and seeing shawodes. The patient is nervous/anxious and has insomnia.   All other systems reviewed and are negative.   Blood pressure 102/69, pulse 80, temperature 98.6 F (37 C), temperature source Oral, resp. rate 16, height 5\' 4"  (1.626 m), weight 68.493 kg (151 lb), last menstrual period 02/18/2015, SpO2 99 %, not currently breastfeeding.Body mass index is 25.91 kg/(m^2).  General Appearance: Casual, Neat and Well Groomed  Eye Contact::  Good  Speech:  Clear and Coherent  Volume:  Normal  Mood:  Anxious and Depressed  Affect:  Restricted  Thought Process:  Coherent and Goal Directed  Orientation:  Full (Time, Place, and Person)  Thought Content:  symptoms events worries concerns  Suicidal Thoughts:  No  Homicidal  Thoughts:  No  Memory:  Immediate;   Fair Recent;   Fair Remote;   Fair  Judgement:  Fair  Insight:  Present  Psychomotor Activity:  Normal  Concentration:  Fair  Recall:  [employer]is[employer]Fair  Fund of Knowledge:Fair  Language: Fair  Akathisia:  No  Handed:  Right  AIMS (if indicated):     Assets:  Communication Skills Desire for Improvement Housing Social Support  ADL's:  Intact  Cognition: WNL  Sleep:  Number of Hours: 6.75    I agree with current treatment plan on 03/29/2015, Patient seen face-to-face for psychiatric evaluation follow-up, chart reviewed. Discussed with patient that this is a recent change to Seroquel and advised patient risk and precautions of increasing medication will give this a try for a few more days.   Treatment Plan:  Daily contact with patient to assess and evaluate symptoms and progress in treatment and Medication management Supportive approach/coping skills Depression; will go ahead and keep the Wellbutrin XL 150 mg and resume the Celexa 40 mg. Wellbutrin could augment the Celexa 40 mg Insomnia; will restore the and Continue Seroquel 50 mg HS (EKG WNL) Will work with CBT/mindfulness   Oneta Rackanika N Lewis FNP-BC 03/29/2015, 10:25 AM  I reviewed chart and agreed with the findings and treatment Plan.  Kathryne SharperSyed Antonella Upson, MD

## 2015-03-29 NOTE — [facility] Group Notes (Signed)
[facility] Group Notes:  (Nursing/MHT/Case Management/Adjunct)  Date:  03/29/2015  Time: 09:30 am  Type of Therapy:  Nurse Education  Participation Level:  Active  Participation Quality:  Appropriate and Attentive  Affect:  Appropriate  Cognitive:  Alert and Appropriate  Insight:  Appropriate and Improving  Engagement in Group:  Developing/Improving  Modes of Intervention:  Discussion and Education  Summary of Progress/Problems: Group focused on identifying healthy coping skills by using coloring to cope. Pt was able to draw a picture and identify her wish for xmas.  Jimmey Ralpherez, Latrina Guttman M 03/29/2015, 12:47 PM

## 2015-03-29 NOTE — Progress Notes (Signed)
Nursing Note :  Nursing Progress Note: 7-7p  D- Pt rates her depression a 10 and anxiety a 7/10. Affect is blunted and appropriate. Pt is able to contract for safety. Continues to have difficulty staying asleep, waking intermittently. " I keep over thinking things and I can't fall asleep, my mind is allover the place". Goal for today is to get sleep medication straight.   A - Observed pt interacting in group and in the milieu.Support and encouragement offered, safety maintained with q 15 minutes. Group discussion included healthy coping skills.  R-Contracts for safety and continues to follow treatment plan, working on learning new coping skills.

## 2015-03-29 NOTE — Progress Notes (Signed)
[facility] Group Notes:  (Nursing/MHT/Case Management/Adjunct)  Date:  03/29/2015  Time:  10:46 PM  Type of Therapy:  Psychoeducational Skills  Participation Level:  Active  Participation Quality:  Appropriate  Affect:  Flat  Cognitive:  Appropriate  Insight:  Good  Engagement in Group:  Developing/Improving  Modes of Intervention:  Education  Summary of Progress/Problems: The patient described her day as having been "okay" and that she was anxious about relapsing and having to return to the hospital for additional treatment. In terms of the theme for the day, her coping skill is to "talk" and be more open with people.   Hazle CocaGOODMAN, Grace Carraway S 03/29/2015, 10:46 PM
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
## 2015-03-29 NOTE — Progress Notes (Addendum)
D.  Pt very upset on approach, states she is not doing well at all.  Pt visibly upset but did not wish to discuss what is wrong with writer at this time.  Today, however,  is Pt's son's birthday who has sickle cell anemia.  Pt stated she was unsure if she could continue to contract for safety.  A.  Pt given ordered medication for her anxiety and agitation.  NP notified of Pt's state of mind and orders received.  Pt informed of options to ensure her safety.  Pt chose to sit in dayroom until bedtime, then requested something more to help her rest.  She stated she is feeling a little better.  NP notified and order received for one time dose of Seroquel 50 mg.  Pt pleased by this because she had felt that her Seroquel dosage needed to be increased.  Pt requested to go to her room to sleep.  Pt informed that staff will monitor her closely to ensure safety during night.  R.  Pt remains safe on unit, will continue to monitor.

## 2015-03-29 NOTE — [facility] Group Notes (Signed)
[facility] Group Notes:  (Clinical Social Work)  03/29/2015     1:15-2:15PM  Summary of Progress/Problems:   The main focus of today's process group was to learn how to use a decisional balance exercise to move forward in the Stages of Change, which were described and discussed.  Patients listed needs on the whiteboard and unhealthy coping techniques often used to fill needs.  Motivational Interviewing and the whiteboard were utilized to help patients explore in depth the perceived benefits and costs of unhealthy coping techniques, as well as the  benefits and costs of replacing that with a healthy coping skills.  A handout was distributed for patients to be able to do this exercise for themselves.   The patient expressed that one unhealthy coping technique she uses is not talking to people, lashing out instead when things get to be too much.  She talked quite a bit in group and was given positive feedback for this.  Type of Therapy:  Group Therapy - Process   Participation Level:  Active  Participation Quality:  Attentive  Affect:  Appropriate  Cognitive:  Appropriate  Insight:  Developing/Improving  Engagement in Therapy:  Developing/Improving  Modes of Intervention:  Education, Motivational Interviewing  Ambrose MantleMareida Grossman-Orr, LCSW 03/29/2015, 4:35 PM

## 2015-03-30 MED ORDER — BIOTENE DRY MOUTH MT LIQD
15.0000 mL | OROMUCOSAL | Status: DC | PRN
  Administered 2015-03-30: 15 mL via OROMUCOSAL
  Filled 2015-03-30 (×6): qty 15

## 2015-03-30 NOTE — Progress Notes (Signed)
[facility] MD Progress Note  03/30/2015 9:26 AM Grace [name]  MRN:  [id number]   Subjective:  States "I had a episode last night, I just lost it."  Objective: Grace [name] is awake, alert and oriented X4 , found attending group session. Denies suicidal or homicidal ideation. Denies auditory or visual hallucination and does not appear to be responding to internal stimuli. Patient reports interacting well with staff and others. Patient reports she is medication compliant without mediation side effects. Report learning new coping skill to read and pace the halls and is still working on them. States her depression 7/10. Patient states " I had a episode last night after a phone call, I just lost it"  States good appetite and is otherwise resting well, States after "I demanded more Seroquel" I was fine. I as able to talk to peers which helped clam me down. States "I think I need 100 mg of Seroquel this helps me sleep better. Patient reports she is hopeful for discharge on Monday. Support, encouragement and reassurance was provided.   Principal Problem: MDD (major depressive disorder), recurrent episode, moderate (HCC) Diagnosis:   Patient Active Problem List   Diagnosis Date Noted  . Hyperthyroidism [E05.90] 03/21/2015  . Tachycardia [R00.0] 03/21/2015  . Palpitations [R00.2] 03/21/2015  . Chest pain [R07.9] 03/21/2015  . MDD (major depressive disorder), recurrent episode, moderate (HCC) [F33.1] 03/16/2015  . Consultation for female sterilization [Z30.09] 05/09/2013  . NSVD (normal spontaneous vaginal delivery) [O80] 03/30/2013  . Chronic hypertension in pregnancy [O10.919] 03/29/2013  . HTN in pregnancy, chronic [O10.919] 03/28/2013  . Transient hypertension of pregnancy, antepartum [O13.9] 03/20/2013  . GERD (gastroesophageal reflux disease) [K21.9] 01/18/2013  . Hyperemesis gravidarum before end of [redacted] week gestation with electrolyte imbalance [O21.1] 09/23/2012  . Trichomonas [A59.9]  09/23/2012  . Supervision of high-risk pregnancy [O09.90] 04/19/2011  . History of molar pregnancy, antepartum [O09.A0] 03/22/2011  . Hyperthyroidism, antepartum [W09.811, E07.9] 03/16/2011  . Sickle cell trait (HCC) [D57.3] 12/30/2010   Total Time spent with patient: 20 minutes  Past Psychiatric History: see admission H and P  Past Medical History:  Past Medical History  Diagnosis Date  . Sickle cell trait (HCC)   . Hyperthyroidism   . Sickle cell trait (HCC)   . Cholecystitis 08/18/2011  . SVD (spontaneous vaginal delivery)     x 3 WH  . Hypertension   . History of recurrent UTIs     recurrent UTIs  . Headache(784.0)     otc med prn  . Joint pain     knees and shoulders - otc med prn    Past Surgical History  Procedure Laterality Date  . Cholecystectomy  08/19/2011    Procedure: LAPAROSCOPIC CHOLECYSTECTOMY WITH INTRAOPERATIVE CHOLANGIOGRAM;  Surgeon: Lodema Pilot, DO;  Location: [facility] OR;  Service: General;  Laterality: N/A;  . Dilation and curettage of uterus      molar preg  . Laparoscopic bilateral salpingectomy Bilateral 05/09/2013    Procedure: LAPAROSCOPIC BILATERAL SALPINGECTOMY;  Surgeon: Allie Bossier, MD;  Location: [facility] ORS;  Service: Gynecology;  Laterality: Bilateral;   Family History:  Family History  Problem Relation Age of Onset  . Diabetes Mother   . Cancer Father   . Diabetes Maternal Uncle   . Cancer Maternal Grandmother   . Cancer Maternal Grandfather   . Hypertension Maternal Grandfather   . Diabetes Maternal Grandfather   . Sickle cell anemia Son   . Asthma Son   . Heart disease Paternal Grandfather   .  Hearing loss Paternal Aunt    Family Psychiatric  History: see Admission H and P Social History:  History  Alcohol Use No     History  Drug Use No    Social History   Social History  . Marital Status: Single    Spouse Name: N/A  . Number of Children: N/A  . Years of Education: N/A   Social History Main Topics  . Smoking status: Never  Smoker   . Smokeless tobacco: Never Used  . Alcohol Use: No  . Drug Use: No  . Sexual Activity: Yes    Birth Control/ Protection: Surgical   Other Topics Concern  . None   Social History Narrative   2 Adults, 4 children at home   FOB involved   Additional Social History:    Pain Medications: Denies abuse Prescriptions: Denies abuse Over the Counter: Denies abuse History of alcohol / drug use?: No history of alcohol / drug abuse                    Sleep: Fair  Appetite:  Fair  Current Medications: Current Facility-Administered Medications  Medication Dose Route Frequency Provider Last Rate Last Dose  . acetaminophen (TYLENOL) tablet 650 mg  650 mg Oral Q4H PRN Craige Cotta, MD      . alum & mag hydroxide-simeth (MAALOX/MYLANTA) 200-200-20 MG/5ML suspension 30 mL  30 mL Oral PRN Rockey Situ Cobos, MD      . buPROPion (WELLBUTRIN XL) 24 hr tablet 150 mg  150 mg Oral Daily Craige Cotta, MD   150 mg at 03/30/15 0855  . citalopram (CELEXA) tablet 40 mg  40 mg Oral Daily Rachael Fee, MD   40 mg at 03/30/15 0855  . hydrOXYzine (ATARAX/VISTARIL) tablet 25 mg  25 mg Oral Q6H PRN Craige Cotta, MD   25 mg at 03/29/15 2035  . ibuprofen (ADVIL,MOTRIN) tablet 600 mg  600 mg Oral Q8H PRN Craige Cotta, MD   600 mg at 03/29/15 1112  . LORazepam (ATIVAN) tablet 1 mg  1 mg Oral Q8H PRN Craige Cotta, MD   1 mg at 03/29/15 2035  . methimazole (TAPAZOLE) tablet 20 mg  20 mg Oral BID Craige Cotta, MD   20 mg at 03/30/15 0855  . metoprolol tartrate (LOPRESSOR) tablet 12.5 mg  12.5 mg Oral BID Craige Cotta, MD   12.5 mg at 03/30/15 0855  . ondansetron (ZOFRAN) tablet 4 mg  4 mg Oral Q8H PRN Craige Cotta, MD   4 mg at 03/28/15 1341  . pantoprazole (PROTONIX) EC tablet 40 mg  40 mg Oral Daily Craige Cotta, MD   40 mg at 03/30/15 0855  . QUEtiapine (SEROQUEL) tablet 50 mg  50 mg Oral QHS Rachael Fee, MD   50 mg at 03/29/15 2131    Lab Results: No results  found for this or any previous visit (from the past 48 hour(s)).  Physical Findings: AIMS: Facial and Oral Movements Muscles of Facial Expression: None, normal Lips and Perioral Area: None, normal Jaw: None, normal Tongue: None, normal,Extremity Movements Upper (arms, wrists, hands, fingers): None, normal Lower (legs, knees, ankles, toes): None, normal, Trunk Movements Neck, shoulders, hips: None, normal, Overall Severity Severity of abnormal movements (highest score from questions above): None, normal Incapacitation due to abnormal movements: None, normal Patient's awareness of abnormal movements (rate only patient's report): No Awareness, Dental Status Current problems with teeth and/or dentures?: No Does  patient usually wear dentures?: No  CIWA:    COWS:     Musculoskeletal: Strength & Muscle Tone: within normal limits Gait & Station: normal Patient leans: normal  Psychiatric Specialty Exam: Review of Systems  Constitutional: Negative.   HENT:       Migraine  Eyes: Negative.   Respiratory: Negative.   Cardiovascular: Negative.   Genitourinary: Negative.   Musculoskeletal: Negative.   Skin: Negative.   Endo/Heme/Allergies: Negative.   Psychiatric/Behavioral: Positive for depression. Hallucinations: reports hearing voices and seeing shawodes. The patient is nervous/anxious and has insomnia.   All other systems reviewed and are negative.   Blood pressure 111/74, pulse 96, temperature 98.6 F (37 C), temperature source Oral, resp. rate 18, height 5\' 4"  (1.626 m), weight 68.493 kg (151 lb), last menstrual period 02/18/2015, SpO2 99 %, not currently breastfeeding.Body mass index is 25.91 kg/(m^2).  General Appearance: Casual and Neat  Eye Contact::  Good  Speech:  Clear and Coherent and Normal Rate  Volume:  Normal  Mood:  Anxious and Depressed  Affect:  Restricted  Thought Process:  Coherent and Goal Directed  Orientation:  Full (Time, Place, and Person)  Thought  Content:  symptoms events worries concerns  Suicidal Thoughts:  No  Homicidal Thoughts:  No  Memory:  Immediate;   Fair Recent;   Fair Remote;   Fair  Judgement:  Fair  Insight:  Fair and Lacking  Psychomotor Activity:  Restlessness  Concentration:  Fair  Recall:  [employer]Fair  Fund of Knowledge:Fair  Language: Fair  Akathisia:  No  Handed:  Right  AIMS (if indicated):     Assets:  Communication Skills Desire for Improvement Housing Social Support  ADL's:  Intact  Cognition: WNL  Sleep:  Number of Hours: 6.25    I agree with current treatment plan on 03/30/2015, Patient seen face-to-face for psychiatric evaluation follow-up, chart reviewed. Discussed with patient that this is a recent change to Seroquel and advised patient risk and precautions of increasing medication will give this a try for a few more days.   Treatment Plan:  Daily contact with patient to assess and evaluate symptoms and progress in treatment and Medication management Supportive approach/coping skills Depression; will go ahead and keep the Wellbutrin XL 150 mg and resume the Celexa 40 mg. Wellbutrin could augment the Celexa 40 mg Insomnia; will restore the and Continue Seroquel 50 mg HS (EKG WNL) Will work with CBT/mindfulness   Oneta Rackanika N Lewis FNP-BC 03/30/2015, 9:26 AM I reviewed chart and agreed with the findings and treatment Plan.  Kathryne SharperSyed Ishia Tenorio, MD

## 2015-03-30 NOTE — [facility] Group Notes (Signed)
[facility] Group Notes:  (Nursing/MHT/Case Management/Adjunct)  Date:  03/30/2015  Time:  2:03 PM  Type of Therapy:  Nurse Education  Participation Level:  Active  Participation Quality:  Appropriate and Attentive  Affect:  Anxious and Appropriate  Cognitive:  Alert and Appropriate  Insight:  Improving  Engagement in Group:  Engaged and Improving  Modes of Intervention:  Discussion and Education  Summary of Progress/Problems:  Group topic was Health Support systems.  Discussed daily goals and what is our favorite holiday.  Kaede states that her favorite holiday is April Fools because it is her son's birthday.  She states that her goal for "today is to learn new coping skills."  Levin BaconHeather V Alen Matheson 03/30/2015, 2:03 PM

## 2015-03-30 NOTE — Progress Notes (Signed)
DAR Note: Grace [name] was visible on the unit.  Interacting with peers and staff.  She has been attending groups.  She continues to voice suicidal thoughts but no plan or intent.  She is able to contract for safety on the unit.  She voices that she is seeing visions of shadows on the unit and states that this started when she was admitted.  She completed her self inventory and reports that her depression is 8/10, hopelessness 7/10 and anxiety 10/10. PRN ativan given for anxiety with good relief.  She states that her goal today was "coping" and she will achieve this goal by "writing."  She denies any pain or discomfort and appears to be in no physical distress.  Encouraged continued participation in group and unit activities.  Q 15 minute checks maintained for safety.  We will continue to monitor the progress towards her goals.

## 2015-03-30 NOTE — Progress Notes (Signed)
D.  Pt pleasant on approach, did ask about nighttime Seroquel dose being increased.  Positive for evening wrap up group, interacting appropriately with peers on the unit.  Denies SI/HI/hallucinations at this time.  Affect much brighter than last night.  A.  Explained NP note about Seroquel affecting QT Interval and that although her EKG was normal, doctor must review this tomorrow.  Pt agreeable to this.  Medication given as ordered,  Support and encouragement offered  R.  Pt remains safe on the unit, will continue to monitor.

## 2015-03-30 NOTE — [facility] Group Notes (Signed)
[facility] Group Notes:  (Clinical Social Work)   @TODAY @   1:15-2:15PM  Summary of Progress/Problems:   The main focus of today's process group was to   1)  discuss the importance of adding supports  2)  define health supports versus unhealthy supports  3)  identify the patient's current unhealthy supports and plan how to handle them  4)  Identify the patient's current healthy supports and plan what to add.  An emphasis was placed on using counselor, doctor, therapy groups, 12-step groups, and problem-specific support groups to expand supports.    The patient expressed full comprehension of the concepts presented, and agreed that there is a need to add more supports.  The patient stated her mother tells her that nothing is wrong with her, that she needs to just "get over it."  The group brainstormed some ways for her to handle that including taking her to doctor with her, having talks with her setting limits.  She accepted the feedback well, said she will try some of the suggestions.  Type of Therapy:  Process Group with Motivational Interviewing  Participation Level:  Active  Participation Quality:  Attentive and Sharing  Affect:  Flat  Cognitive:  Appropriate and Oriented  Insight:  Developing/Improving  Engagement in Therapy:  Engaged  Modes of Intervention:   Education, Support and Processing, Activity  Norinne Jeane Grossman-Orr, LCSW @TODAY @   4:41 PM

## 2015-03-31 MED ORDER — IBUPROFEN 400 MG PO TABS
ORAL_TABLET | ORAL | Status: AC
  Filled 2015-03-31: qty 1

## 2015-03-31 MED ORDER — QUETIAPINE FUMARATE 25 MG PO TABS
75.0000 mg | ORAL_TABLET | Freq: Every day | ORAL | Status: DC
  Administered 2015-03-31: 75 mg via ORAL
  Filled 2015-03-31 (×2): qty 3
  Filled 2015-03-31: qty 21

## 2015-03-31 MED ORDER — IBUPROFEN 400 MG PO TABS
400.0000 mg | ORAL_TABLET | Freq: Once | ORAL | Status: AC
  Administered 2015-03-31: 400 mg via ORAL

## 2015-03-31 NOTE — Tx Team (Signed)
Interdisciplinary Treatment Plan Update (Adult) Date: 03/31/2015   Date: 03/31/2015 11:29 AM  Progress in Treatment:  Attending groups: Yes  Participating in groups: Yes, minimally  Taking medication as prescribed: Yes  Tolerating medication: Yes  Family/Significant othe contact made: No, Pt declines Patient understands diagnosis: Yes AEB seeking help with depression Discussing patient identified problems/goals with staff: Yes  Medical problems stabilized or resolved: Yes  Denies suicidal/homicidal ideation: No, endorses passive SI Patient has not harmed self or Others: Yes   New problem(s) identified: None identified at this time.   Discharge Plan or Barriers: Pt will return home and follow-up with [facility] [place]  Additional comments:  Patient and CSW reviewed pt's identified goals and treatment plan. Patient verbalized understanding and agreed to treatment plan. CSW reviewed [facility] "Discharge Process and Patient Involvement" Form. Pt verbalized understanding of information provided and signed form.   Reason for Continuation of Hospitalization:  Depression Medication stabilization Suicidal ideation  Estimated length of stay: 2-3 days  Review of initial/current patient goals per problem list:   1.  Goal(s): Patient will participate in aftercare plan  Met:  Yes  Target date: 3-5 days from date of admission   As evidenced by: Patient will participate within aftercare plan AEB aftercare provider and housing plan at discharge being identified.   03/18/15: Pt will discharge home and follow-up with [facility] [place]  2.  Goal (s): Patient will exhibit decreased depressive symptoms and suicidal ideations.  Met:  No  Target date: 3-5 days from date of admission   As evidenced by: Patient will utilize self rating of depression at 3 or below and demonstrate decreased signs of depression or be deemed stable for discharge by MD. 03/18/15: Pt was  admitted with symptoms of depression, rating 10/10. Pt continues to present with flat affect and depressive symptoms.  Pt will demonstrate decreased symptoms of depression and rate depression at 3/10 or lower prior to discharge. 03/21/15: Pt rates depression at 7/10; denies SI 03/26/15: Pt continues to rate depression highly at 9/10; endorses passive SI and thoughts to self-harm. 03/31/15: Pt continues to rate depression at 7/10; endorses passive SI and thoughts to self-harm.   Attendees:  Patient:    Family:    Physician: Dr. Parke Poisson, MD  03/31/2015 11:29 AM  Nursing: Lars Pinks, RN Case manager  03/31/2015 11:29 AM  Clinical Social Worker Peri Maris, [place] 03/31/2015 11:29 AM  Other:  03/31/2015 11:29 AM  Clinical: Manuella Ghazi, RN  03/31/2015 11:29 AM  Other: , RN Charge Nurse 03/31/2015 11:29 AM  Other:     Peri Maris, [place] Social Work [phone]

## 2015-03-31 NOTE — Plan of Care (Signed)
Problem: Diagnosis: Increased Risk For Suicide Attempt Goal: LTG-Patient Will Report Improved Mood and Deny Suicidal LTG (by discharge) Patient will report improved mood and deny suicidal ideation.  Outcome: Progressing Pt much improved this shift, had a difficult night on Saturday but was able to cope without self harm.  Reports feeling much better tonight, positive for group with appropriate participation.

## 2015-03-31 NOTE — Progress Notes (Signed)
DAR Note: Grace [name] has been visible on the unit.  She has been attending groups.  She continues to voice passive suicidal ideation and can contract for safety on the unit.  She reports that she continues to see shadows but no auditory hallucinations.  She stated that she slept better last night.  She took her medications without difficulty.  She completed her self inventory and reports that her depression and hopelessness are 7/10 and her anxiety is 10/10.  She states that her goal for today is to work on her "anxiety" and she will try to accomplish this goal by "coping."  She c/o of headache 9/10 earlier tylenol given with good relief.  Encouraged continued participation in group and unit activities.  Q 15 minute checks maintained for safety.  We will continue to monitor the progress towards her goals.

## 2015-03-31 NOTE — Progress Notes (Signed)
Adult Psychoeducational Group Note  Date:  03/31/2015 Time:  8:57 PM  Group Topic/Focus:  Wrap-Up Group:   The focus of this group is to help patients review their daily goal of treatment and discuss progress on daily workbooks.  Participation Level:  Active  Participation Quality:  Appropriate and Attentive  Affect:  Appropriate  Cognitive:  Appropriate  Insight: Appropriate and Good  Engagement in Group:  Engaged  Modes of Intervention:  Education  Additional Comments:  Pt had an ok day and considered it entertaining. Pt goal for tomorrow is to wake up well rested.   Merlinda FrederickKeshia S Tashara Suder 03/31/2015, 8:57 PM

## 2015-03-31 NOTE — [facility] Group Notes (Signed)
[facility]  03/31/2015 8:45 AM  Participation Quality: Alert, Appropriate and Oriented  Mood/Affect: Flat  Depression Rating: 6-7  Anxiety Rating: 6-7  Thoughts of Suicide: Pt endorses passive SI  Will you contract for safety? Yes  Current AVH: Pt denies  Plan for Discharge/Comments: Pt attended discharge planning group and actively participated in group. CSW discussed suicide prevention education with the group and encouraged them to discuss discharge planning and any relevant barriers. Pt reports that she had an "episode" this weekend and is still feeling suicidal "off and on."   Transportation Means: Pt reports access to transportation  Supports: No supports mentioned at this time  Chad CordialLauren Carter, LCSWA 03/31/2015 10:07 AM

## 2015-03-31 NOTE — Progress Notes (Signed)
Patient ID: Grace [name], female   DOB: 1985/11/03, 29 y.o.   MRN: [id number] [facility] MD Progress Note  03/31/2015 2:29 PM [place]  MRN:  [id number]   Subjective:  Patient reports some improvement, and although still reports anxiety, some depression, states she is feeling " stronger mentally". Denies medication side effects and feels medications are helping . She is hoping to increase Seroquel to improve quality of sleep further. Denies medication side effects.  Objective:  I have discussed case with treatment team and have met with patient. Patient reporting some partial but noticeable improvement of mood, and affect does appear improved compared to prior. As noted, she reports she is feeling " stronger" than she did previously, and is becoming more future oriented, stating that after discharge plans to stay with family member and that she is looking forward to celebrating one of her children's birthday. Patient denying medication side effects. Visible on unit, going to groups, not isolative . Reports ongoing passive SI at times, but admits " I am getting better", and denies any actual suicidal plan or intention. She is more future oriented at this time. Of note, denies any hallucinations and does not appear to be internally preoccupied at this time  Principal Problem: MDD (major depressive disorder), recurrent episode, moderate ([place]) Diagnosis:   Patient Active Problem List   Diagnosis Date Noted  . Hyperthyroidism [E05.90] 03/21/2015  . Tachycardia [R00.0] 03/21/2015  . Palpitations [R00.2] 03/21/2015  . Chest pain [R07.9] 03/21/2015  . MDD (major depressive disorder), recurrent episode, moderate ([place]) [F33.1] 03/16/2015  . Consultation for female sterilization [H08.65] 05/09/2013  . NSVD (normal spontaneous vaginal delivery) [O80] 03/30/2013  . Chronic hypertension in pregnancy [O10.919] 03/29/2013  . HTN in pregnancy, chronic [O10.919] 03/28/2013  . Transient  hypertension of pregnancy, antepartum [O13.9] 03/20/2013  . GERD (gastroesophageal reflux disease) [K21.9] 01/18/2013  . Hyperemesis gravidarum before end of [redacted] week gestation with electrolyte imbalance [O21.1] 09/23/2012  . Trichomonas [A59.9] 09/23/2012  . Supervision of high-risk pregnancy [O09.90] 04/19/2011  . History of molar pregnancy, antepartum [O09.A0] 03/22/2011  . Hyperthyroidism, antepartum [H84.696, E07.9] 03/16/2011  . Sickle cell trait ([place]) [D57.3] 12/30/2010   Total Time spent with patient:  25 minutes   Past Psychiatric History: see admission H and P  Past Medical History:  Past Medical History  Diagnosis Date  . Sickle cell trait ([place])   . Hyperthyroidism   . Sickle cell trait ([place])   . Cholecystitis 08/18/2011  . SVD (spontaneous vaginal delivery)     x 3 WH  . Hypertension   . History of recurrent UTIs     recurrent UTIs  . Headache(784.0)     otc med prn  . Joint pain     knees and shoulders - otc med prn    Past Surgical History  Procedure Laterality Date  . Cholecystectomy  08/19/2011    Procedure: LAPAROSCOPIC CHOLECYSTECTOMY WITH INTRAOPERATIVE CHOLANGIOGRAM;  Surgeon: Madilyn Hook, DO;  Location: [facility];  Service: General;  Laterality: N/A;  . Dilation and curettage of uterus      molar preg  . Laparoscopic bilateral salpingectomy Bilateral 05/09/2013    Procedure: LAPAROSCOPIC BILATERAL SALPINGECTOMY;  Surgeon: Emily Filbert, MD;  Location: [facility];  Service: Gynecology;  Laterality: Bilateral;   Family History:  Family History  Problem Relation Age of Onset  . Diabetes Mother   . Cancer Father   . Diabetes Maternal Uncle   . Cancer Maternal Grandmother   . Cancer Maternal  Grandfather   . Hypertension Maternal Grandfather   . Diabetes Maternal Grandfather   . Sickle cell anemia Son   . Asthma Son   . Heart disease Paternal Grandfather   . Hearing loss Paternal Aunt    Family Psychiatric  History: see Admission H and P Social History:   History  Alcohol Use No     History  Drug Use No    Social History   Social History  . Marital Status: Single    Spouse Name: N/A  . Number of Children: N/A  . Years of Education: N/A   Social History Main Topics  . Smoking status: Never Smoker   . Smokeless tobacco: Never Used  . Alcohol Use: No  . Drug Use: No  . Sexual Activity: Yes    Birth Control/ Protection: Surgical   Other Topics Concern  . None   Social History Narrative   2 Adults, 4 children at home   FOB involved   Additional Social History:    Pain Medications: Denies abuse Prescriptions: Denies abuse Over the Counter: Denies abuse History of alcohol / drug use?: No history of alcohol / drug abuse  Sleep:  Improved   Appetite:   Improved   Current Medications: Current Facility-Administered Medications  Medication Dose Route Frequency Provider Last Rate Last Dose  . acetaminophen (TYLENOL) tablet 650 mg  650 mg Oral Q4H PRN Jenne Campus, MD   650 mg at 03/31/15 1005  . alum & mag hydroxide-simeth (MAALOX/MYLANTA) 200-200-20 MG/5ML suspension 30 mL  30 mL Oral PRN Jenne Campus, MD      . antiseptic oral rinse (BIOTENE) solution 15 mL  15 mL Mouth Rinse PRN Derrill Center, NP   15 mL at 03/30/15 1843  . buPROPion (WELLBUTRIN XL) 24 hr tablet 150 mg  150 mg Oral Daily Jenne Campus, MD   150 mg at 03/31/15 0846  . citalopram (CELEXA) tablet 40 mg  40 mg Oral Daily Nicholaus Bloom, MD   40 mg at 03/31/15 0846  . hydrOXYzine (ATARAX/VISTARIL) tablet 25 mg  25 mg Oral Q6H PRN Jenne Campus, MD   25 mg at 03/30/15 2147  . ibuprofen (ADVIL,MOTRIN) tablet 600 mg  600 mg Oral Q8H PRN Jenne Campus, MD   600 mg at 03/30/15 2042  . LORazepam (ATIVAN) tablet 1 mg  1 mg Oral Q8H PRN Jenne Campus, MD   1 mg at 03/30/15 2147  . methimazole (TAPAZOLE) tablet 20 mg  20 mg Oral BID Jenne Campus, MD   20 mg at 03/31/15 0846  . metoprolol tartrate (LOPRESSOR) tablet 12.5 mg  12.5 mg Oral BID  Jenne Campus, MD   12.5 mg at 03/31/15 0846  . ondansetron (ZOFRAN) tablet 4 mg  4 mg Oral Q8H PRN Jenne Campus, MD   4 mg at 03/28/15 1341  . pantoprazole (PROTONIX) EC tablet 40 mg  40 mg Oral Daily Jenne Campus, MD   40 mg at 03/31/15 0846  . QUEtiapine (SEROQUEL) tablet 50 mg  50 mg Oral QHS Nicholaus Bloom, MD   50 mg at 03/30/15 2147    Lab Results: No results found for this or any previous visit (from the past 48 hour(s)).  Physical Findings: AIMS: Facial and Oral Movements Muscles of Facial Expression: None, normal Lips and Perioral Area: None, normal Jaw: None, normal Tongue: None, normal,Extremity Movements Upper (arms, wrists, hands, fingers): None, normal Lower (legs, knees, ankles, toes):  None, normal, Trunk Movements Neck, shoulders, hips: None, normal, Overall Severity Severity of abnormal movements (highest score from questions above): None, normal Incapacitation due to abnormal movements: None, normal Patient's awareness of abnormal movements (rate only patient's report): No Awareness, Dental Status Current problems with teeth and/or dentures?: No Does patient usually wear dentures?: No  CIWA:    COWS:     Musculoskeletal: Strength & Muscle Tone: within normal limits Gait & Station: normal Patient leans: normal  Psychiatric Specialty Exam: Review of Systems  Constitutional: Negative.   HENT:       Migraine  Eyes: Negative.   Respiratory: Negative.   Cardiovascular: Negative.   Genitourinary: Negative.   Musculoskeletal: Negative.   Skin: Negative.   Endo/Heme/Allergies: Negative.   Psychiatric/Behavioral: Positive for depression. Hallucinations: reports hearing voices and seeing shawodes. The patient is nervous/anxious and has insomnia.   All other systems reviewed and are negative.   Blood pressure 111/67, pulse 86, temperature 98.1 F (36.7 C), temperature source Oral, resp. rate 18, height 5' 4" (1.626 m), weight 151 lb (68.493 kg), last  menstrual period 02/18/2015, SpO2 99 %, not currently breastfeeding.Body mass index is 25.91 kg/(m^2).  General Appearance:  Improved grooming   Eye Contact::  Good  Speech:  Clear and Coherent  Volume:  Normal  Mood:  Less depressed, improving  Affect:   More reactive   Thought Process:  Coherent and Goal Directed  Orientation:  Full (Time, Place, and Person)  Thought Content:  Less severely ruminative about stressors, at this time denies hallucinations, no delusions are expressed, does not appear internally preoccupied   Suicidal Thoughts:  No passive thoughts of death, dying have decreased in frequency. Denies active SI and contracts for safety on unit   Homicidal Thoughts:  No  Memory:  Recent and remote grossly intact   Judgement:   Improving   Insight:   Fair, improving   Psychomotor Activity:  Normal  Concentration:  Good  Recall:  Good  Fund of Knowledge:Good  Language: Good  Akathisia:  No  Handed:  Right  AIMS (if indicated):     Assets:  Communication Skills Desire for Improvement Housing Social Support  ADL's:  Intact  Cognition: WNL  Sleep:  Number of Hours: 6    Assessment - patient is improving compared to prior presentation. Mood is improved, affect is more reactive and noticeably patient is now reporting subjective improvement and a feeling of being stronger and more assertive now. Suicidal ideations have decreased and at this time not suicidal. Has reported some passive thoughts of death/ dying but these too have decreased . At this time tolerating medications well, denies side effects. Hopeful to increase seroquel further as it has helped to address sleep and night time anxiety.  Treatment Plan:  Daily contact with patient to assess and evaluate symptoms and progress in treatment and Medication management Encourage ongoing group participation to work on coping skills and symptom reduction Depression; continue  Wellbutrin XL 150 mg QAM Depression ; continue  Celexa 40 mg QDAY  Insomnia/ Anxiety ; increase Seroquel to 75 mgrs QHS  Repeat EKG to rule out any QTc changes on current medication regimen Treatment Team working on disposition planning  Neita Garnet MD  03/31/2015, 2:29 PM
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
## 2015-04-01 MED ORDER — BUPROPION HCL ER (XL) 150 MG PO TB24
150.0000 mg | ORAL_TABLET | Freq: Every day | ORAL | Status: DC
Start: 2015-04-01 — End: 2015-04-25

## 2015-04-01 MED ORDER — METOPROLOL TARTRATE 25 MG PO TABS
12.5000 mg | ORAL_TABLET | Freq: Two times a day (BID) | ORAL | Status: DC
Start: 2015-04-01 — End: 2015-04-25

## 2015-04-01 MED ORDER — CITALOPRAM HYDROBROMIDE 40 MG PO TABS: 40.0000 mg | ORAL_TABLET | Freq: Every day | ORAL | Status: DC

## 2015-04-01 MED ORDER — PANTOPRAZOLE SODIUM 40 MG PO TBEC
40.0000 mg | DELAYED_RELEASE_TABLET | Freq: Every day | ORAL | Status: DC
Start: 2015-04-01 — End: 2015-04-25

## 2015-04-01 MED ORDER — HYDROXYZINE HCL 25 MG PO TABS: 25.0000 mg | ORAL_TABLET | Freq: Four times a day (QID) | ORAL | Status: DC | PRN

## 2015-04-01 MED ORDER — METHIMAZOLE 10 MG PO TABS: 20.0000 mg | ORAL_TABLET | Freq: Two times a day (BID) | ORAL | Status: DC

## 2015-04-01 MED ORDER — IBUPROFEN 200 MG PO TABS: 400.0000 mg | ORAL_TABLET | Freq: Four times a day (QID) | ORAL | Status: DC | PRN

## 2015-04-01 MED ORDER — QUETIAPINE FUMARATE 25 MG PO TABS: 75.0000 mg | ORAL_TABLET | Freq: Every day | ORAL | Status: DC

## 2015-04-01 NOTE — Tx Team (Signed)
Interdisciplinary Treatment Plan Update (Adult) Date: 04/01/2015   Date: 04/01/2015 10:40 AM  Progress in Treatment:  Attending groups: Yes  Participating in groups: Yes, minimally  Taking medication as prescribed: Yes  Tolerating medication: Yes  Family/Significant othe contact made: No, Pt declines Patient understands diagnosis: Yes AEB seeking help with depression Discussing patient identified problems/goals with staff: Yes  Medical problems stabilized or resolved: Yes  Denies suicidal/homicidal ideation: Yes Patient has not harmed self or Others: Yes   New problem(s) identified: None identified at this time.   Discharge Plan or Barriers: Pt will return home and follow-up with [facility]  Additional comments:  Patient and CSW reviewed pt's identified goals and treatment plan. Patient verbalized understanding and agreed to treatment plan. CSW reviewed [facility] "Discharge Process and Patient Involvement" Form. Pt verbalized understanding of information provided and signed form.   Reason for Continuation of Hospitalization:  Depression Medication stabilization Suicidal ideation  Estimated length of stay: 0 days; Pt stable for DC  Review of initial/current patient goals per problem list:   1.  Goal(s): Patient will participate in aftercare plan  Met:  Yes  Target date: 3-5 days from date of admission   As evidenced by: Patient will participate within aftercare plan AEB aftercare provider and housing plan at discharge being identified.   03/18/15: Pt will discharge home and follow-up with [facility] [place]  2.  Goal (s): Patient will exhibit decreased depressive symptoms and suicidal ideations.  Met:  Adequate for DC  Target date: 3-5 days from date of admission   As evidenced by: Patient will utilize self rating of depression at 3 or below and demonstrate decreased signs of depression or be deemed stable for discharge by MD. 03/18/15: Pt  was admitted with symptoms of depression, rating 10/10. Pt continues to present with flat affect and depressive symptoms.  Pt will demonstrate decreased symptoms of depression and rate depression at 3/10 or lower prior to discharge. 03/21/15: Pt rates depression at 7/10; denies SI 03/26/15: Pt continues to rate depression highly at 9/10; endorses passive SI and thoughts to self-harm. 03/31/15: Pt continues to rate depression at 7/10; endorses passive SI and thoughts to self-harm. 04/01/15: MD feels that Pt's symptoms have decreased to the point that they can be managed in an outpatient setting   Attendees:  Patient:    Family:    Physician: Dr. Parke Poisson, MD  04/01/2015 10:40 AM  Nursing: Lars Pinks, RN Case manager  04/01/2015 10:40 AM  Clinical Social Worker Peri Maris, [place] 04/01/2015 10:40 AM  Other:  04/01/2015 10:40 AM  Clinical: Darrol Angel, RN  04/01/2015 10:40 AM  Other: , RN Charge Nurse 04/01/2015 10:40 AM  Other:     Peri Maris, [place] Work [phone]

## 2015-04-01 NOTE — Progress Notes (Signed)
Patient ID: Grace [name], female   DOB: 1985/07/03, 29 y.o.   MRN: [id number] D: Patient visible on unit interacting with peers. Pt reports issues with insomnia. Pt reports she is tolerating medication well. Pt denies SI/HI/AVH and pain. Pt attended and participated in evening wrap up group. Cooperative with assessment.    A: Met with pt 1:1. Medications administered as prescribed. Support and encouragement provided. Pt encouraged to discuss feelings and come to staff with any question or concerns.   R: Patient remains safe and complaint with medications.

## 2015-04-01 NOTE — Discharge Summary (Signed)
Physician Discharge Summary Note  Patient:  Grace [name] is an 29 y.o., female MRN:  [id number] DOB:  03-17-1986 Patient phone:  [phone] (home)  Patient address:   [address] [place] [address],  Total Time spent with patient: Greater than 30 minutes  Date of Admission:  03/16/2015  Date of Discharge: 04/01/2015  Reason for Admission:  History of Present Illness:: 29 year old female, states she has been dealing with depression for " a long time on and off ". States that over the last 2 weeks or so her depression has been worsening, with increased sadness, neuro- vegetative symptoms as listed below, and has developed suicidal ideations, such as fantasizing about cutting self or walking into traffic. States she has been more " stressed " recently. Reports she had a relationship break up with her boyfriend in early November ( and also found out that he had been being unfaithful to her ) , and also reports that her father passed away suddenly in 08/24/09, and that last Thursday was his birthday. Patient states she has continued to grieve his death, and that she feels more depressed around his birthday.  Principal Problem: MDD (major depressive disorder), recurrent episode, moderate (HCC)  Discharge Diagnoses: Patient Active Problem List   Diagnosis Date Noted  . Hyperthyroidism [E05.90] 03/21/2015  . Tachycardia [R00.0] 03/21/2015  . Palpitations [R00.2] 03/21/2015  . Chest pain [R07.9] 03/21/2015  . MDD (major depressive disorder), recurrent episode, moderate (HCC) [F33.1] 03/16/2015  . Consultation for female sterilization [Z30.09] 05/09/2013  . NSVD (normal spontaneous vaginal delivery) [O80] 03/30/2013  . Chronic hypertension in pregnancy [O10.919] 03/29/2013  . HTN in pregnancy, chronic [O10.919] 03/28/2013  . Transient hypertension of pregnancy, antepartum [O13.9] 03/20/2013  . GERD (gastroesophageal reflux disease) [K21.9] 01/18/2013  . Hyperemesis gravidarum  before end of [redacted] week gestation with electrolyte imbalance [O21.1] 09/23/2012  . Trichomonas [A59.9] 09/23/2012  . Supervision of high-risk pregnancy [O09.90] 04/19/2011  . History of molar pregnancy, antepartum [O09.A0] 03/22/2011  . Hyperthyroidism, antepartum [O13.086, E07.9] 03/16/2011  . Sickle cell trait (HCC) [D57.3] 12/30/2010   Past Psychiatric History: Major depression  Past Medical History:  Past Medical History  Diagnosis Date  . Sickle cell trait (HCC)   . Hyperthyroidism   . Sickle cell trait (HCC)   . Cholecystitis 25-Aug-2011  . SVD (spontaneous vaginal delivery)     x 3 WH  . Hypertension   . History of recurrent UTIs     recurrent UTIs  . Headache(784.0)     otc med prn  . Joint pain     knees and shoulders - otc med prn    Past Surgical History  Procedure Laterality Date  . Cholecystectomy  08/19/2011    Procedure: LAPAROSCOPIC CHOLECYSTECTOMY WITH INTRAOPERATIVE CHOLANGIOGRAM;  Surgeon: Lodema Pilot, DO;  Location: [facility] OR;  Service: General;  Laterality: N/A;  . Dilation and curettage of uterus      molar preg  . Laparoscopic bilateral salpingectomy Bilateral 05/09/2013    Procedure: LAPAROSCOPIC BILATERAL SALPINGECTOMY;  Surgeon: Allie Bossier, MD;  Location: [facility];  Service: Gynecology;  Laterality: Bilateral;   Family History:  Family History  Problem Relation Age of Onset  . Diabetes Mother   . Cancer Father   . Diabetes Maternal Uncle   . Cancer Maternal Grandmother   . Cancer Maternal Grandfather   . Hypertension Maternal Grandfather   . Diabetes Maternal Grandfather   . Sickle cell anemia Son   . Asthma Son   .  Heart disease Paternal Grandfather   . Hearing loss Paternal Aunt    Family Psychiatric  History: See H&P  Social History:  History  Alcohol Use No     History  Drug Use No    Social History   Social History  . Marital Status: Single    Spouse Name: N/A  . Number of Children: N/A  . Years of Education: N/A   Social History  Main Topics  . Smoking status: Never Smoker   . Smokeless tobacco: Never Used  . Alcohol Use: No  . Drug Use: No  . Sexual Activity: Yes    Birth Control/ Protection: Surgical   Other Topics Concern  . None   Social History Narrative   2 Adults, 4 children at home   FOB involved   Hospital Course:  Grace [name] was admitted for MDD (major depressive disorder), recurrent episode, moderate (HCC)  and crisis management.  Pt was treated discharged with the medications listed below under Medication List.  Medical problems were identified and treated as needed.  Home medications were restarted as appropriate.  Improvement was monitored by observation and Grace [name] 's daily report of symptom reduction.  Emotional and mental status was monitored by daily self-inventory reports completed by Grace [name] and clinical staff.         Grace [name] was evaluated by the treatment team for stability and plans for continued recovery upon discharge. Grace [name] 's motivation was an integral factor for scheduling further treatment. Employment, transportation, bed availability, health status, family support, and any pending legal issues were also considered during hospital stay. Pt was offered further treatment options upon discharge including but not limited to Residential, Intensive Outpatient, and Outpatient treatment.  Grace [name] will follow up with the services as listed below under Follow Up Information.     Upon completion of this admission the patient was both mentally and medically stable for discharge denying suicidal/homicidal ideation, auditory/visual/tactile hallucinations, delusional thoughts and paranoia.    Grace [name] responded well to treatment with Wellbutrin, Celexa, Vistaril, Ativan, and Seroquel without adverse effects. Pt demonstrated improvement without reported or observed adverse effects to the point of stability appropriate for  outpatient management. Pertinent labs include: AST 52, ALT 69. Reviewed CBC, CMP, BAL, and UDS; all unremarkable aside from noted exceptions.   Physical Findings:  AIMS: Facial and Oral Movements Muscles of Facial Expression: None, normal Lips and Perioral Area: None, normal Jaw: None, normal Tongue: None, normal,Extremity Movements Upper (arms, wrists, hands, fingers): None, normal Lower (legs, knees, ankles, toes): None, normal, Trunk Movements Neck, shoulders, hips: None, normal, Overall Severity Severity of abnormal movements (highest score from questions above): None, normal Incapacitation due to abnormal movements: None, normal Patient's awareness of abnormal movements (rate only patient's report): No Awareness, Dental Status Current problems with teeth and/or dentures?: No Does patient usually wear dentures?: No  CIWA:    COWS:     Musculoskeletal: Strength & Muscle Tone: within normal limits Gait & Station: normal Patient leans: N/A  Psychiatric Specialty Exam: Review of Systems  Constitutional: Negative.   HENT: Negative.   Eyes: Negative.   Respiratory: Negative.   Cardiovascular: Negative.   Gastrointestinal: Negative.   Genitourinary: Negative.   Musculoskeletal: Negative.   Skin: Negative.   Neurological: Negative.   Endo/Heme/Allergies: Negative.   Psychiatric/Behavioral: Positive for depression (Stable). Negative for suicidal ideas, hallucinations, memory loss and substance abuse. The patient is nervous/anxious and has insomnia (Stable).  All other systems reviewed and are negative.   Blood pressure 103/69, pulse 92, temperature 98.6 F (37 C), temperature source Oral, resp. rate 16, height 5\' 4"  (1.626 m), weight 68.493 kg (151 lb), last menstrual period 02/18/2015, SpO2 99 %, not currently breastfeeding.Body mass index is 25.91 kg/(m^2).  See MD SRA   Have you used any form of tobacco in the last 30 days? (Cigarettes, Smokeless Tobacco, Cigars, and/or  Pipes): No  Has this patient used any form of tobacco in the last 30 days? (Cigarettes, Smokeless Tobacco, Cigars, and/or Pipes) Yes, No  Metabolic Disorder Labs:  No results found for: HGBA1C, MPG No results found for: PROLACTIN No results found for: CHOL, TRIG, HDL, CHOLHDL, VLDL, LDLCALC  See Psychiatric Specialty Exam and Suicide Risk Assessment completed by Attending Physician prior to discharge.  Discharge destination:  Home  Is patient on multiple antipsychotic therapies at discharge:  No   Has Patient had three or more failed trials of antipsychotic monotherapy by history:  No  Recommended Plan for Multiple Antipsychotic Therapies: NA    Medication List    STOP taking these medications        megestrol 20 MG tablet  Commonly known as:  MEGACE      TAKE these medications      Indication   buPROPion 150 MG 24 hr tablet  Commonly known as:  WELLBUTRIN XL  Take 1 tablet (150 mg total) by mouth daily. For depression   Indication:  Major Depressive Disorder     citalopram 40 MG tablet  Commonly known as:  CELEXA  Take 1 tablet (40 mg total) by mouth daily. For depression   Indication:  Depression     hydrOXYzine 25 MG tablet  Commonly known as:  ATARAX/VISTARIL  Take 1 tablet (25 mg total) by mouth every 6 (six) hours as needed for anxiety.   Indication:  Tension, Anxiety     ibuprofen 200 MG tablet  Commonly known as:  ADVIL,MOTRIN  Take 2 tablets (400 mg total) by mouth every 6 (six) hours as needed for moderate pain.   Indication:  Mild to Moderate Pain     methimazole 10 MG tablet  Commonly known as:  TAPAZOLE  Take 2 tablets (20 mg total) by mouth 2 (two) times daily. For hyperthyroidim   Indication:  Overactive Thyroid Gland     metoprolol tartrate 25 MG tablet  Commonly known as:  LOPRESSOR  Take 0.5 tablets (12.5 mg total) by mouth 2 (two) times daily. For high blood pressure   Indication:  High Blood Pressure     pantoprazole 40 MG tablet   Commonly known as:  PROTONIX  Take 1 tablet (40 mg total) by mouth daily. For acid reflux   Indication:  Gastroesophageal Reflux Disease     QUEtiapine 25 MG tablet  Commonly known as:  SEROQUEL  Take 3 tablets (75 mg total) by mouth at bedtime. For depression   Indication:  Mood control       Follow-up Information    Follow up with [facility].   Specialty:  Professional Counselor   Why:  This is a walk-in appointment. Please go between 8am-12pm Monday-Friday for your initial appointment for medication management and therapy.   Contact information:   [address] [place] [address] [phone]      Follow-up recommendations:  Activity:  As tolerated Diet: As recommended by your primary care doctor. Keep all scheduled follow-up appointments as recommended.  Comments:  Take all your medications as prescribed by your mental healthcare provider. Report any adverse effects and or reactions from your medicines to your outpatient provider promptly. Patient is instructed and cautioned to not engage in alcohol and or illegal drug use while on prescription medicines. In the event of worsening symptoms, patient is instructed to call the crisis hotline, 911 and or go to the nearest ED for appropriate evaluation and treatment of symptoms. Follow-up with your primary care provider for your other medical issues, concerns and or health care needs.   Signed: Beau Fanny, FNP  04/01/2015, 10:30 AM  Patient seen, Suicide Assessment Completed.  Disposition Plan Reviewed

## 2015-04-01 NOTE — Progress Notes (Signed)
Discharge note: Pt received both written and verbal discharge instructions. Pt verbalized understanding of discharge instructions. Pt agreed to f/u appt and med regimen. Pt received sample meds, prescriptions and belongings from locker. Pt safely discharged to lobby.  EVS found a bag of pt belongings in the pts assigned room. Writer contacted pt, no response, Education officer, environmentalwriter left voicemail.
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
## 2015-04-01 NOTE — Progress Notes (Signed)
Pt attended spiritual care group on grief and loss facilitated by chaplain Shemica Meath  Group opened with brief discussion and psycho-social ed around grief and loss in relationships and in relation to self - identifying life patterns, circumstances, changes that cause losses. Established group norm of speaking from own life experience. Group goal of establishing open and affirming space for members to share loss and experience with grief, normalize grief experience and provide psycho social education and grief support. Group process drew on brief CBT, narrative and Adlerian models    

## 2015-04-01 NOTE — Progress Notes (Signed)
  [facility]  Will you be returning to the same living situation after discharge:  Yes,  Pt returning home with family At discharge, do you have transportation home?: Yes,  Friend to provide transportation Do you have the ability to pay for your medications: Yes,  Pt provided with 7-day supply and prescriptions  Release of information consent forms completed and in the chart;  Patient's signature needed at discharge.  Patient to Follow up at: Follow-up Information    Follow up with [facility].   Specialty:  Professional Counselor   Why:  This is a walk-in appointment. Please go between 8am-12pm Monday-Friday for your initial appointment for medication management and therapy.   Contact information:   [address] [place] [address] [phone]       Next level of care provider [facility] Link:no  Patient denies SI/HI: Yes,  Pt denies    Safety Planning and Suicide Prevention discussed: Yes,  with Pt; declined family contact  Have you used any form of tobacco in the last 30 days? (Cigarettes, Smokeless Tobacco, Cigars, and/or Pipes): No  Has patient been referred to the Quitline?: N/A patient is not a smoker  Patient has been referred for addiction treatment: N/A  Elaina HoopsCarter, Jes Costales M 04/01/2015, 10:43 AM

## 2015-04-14 ENCOUNTER — Other Ambulatory Visit: Payer: Self-pay

## 2015-04-14 ENCOUNTER — Emergency Department (HOSPITAL_COMMUNITY): Payer: Self-pay

## 2015-04-14 ENCOUNTER — Encounter (HOSPITAL_COMMUNITY): Payer: Self-pay | Admitting: Emergency Medicine

## 2015-04-14 DIAGNOSIS — Z8719 Personal history of other diseases of the digestive system: Secondary | ICD-10-CM | POA: Insufficient documentation

## 2015-04-14 DIAGNOSIS — R42 Dizziness and giddiness: Secondary | ICD-10-CM | POA: Insufficient documentation

## 2015-04-14 DIAGNOSIS — R079 Chest pain, unspecified: Secondary | ICD-10-CM | POA: Insufficient documentation

## 2015-04-14 DIAGNOSIS — R6883 Chills (without fever): Secondary | ICD-10-CM | POA: Insufficient documentation

## 2015-04-14 DIAGNOSIS — R000 Tachycardia, unspecified: Secondary | ICD-10-CM | POA: Insufficient documentation

## 2015-04-14 DIAGNOSIS — R002 Palpitations: Secondary | ICD-10-CM | POA: Insufficient documentation

## 2015-04-14 DIAGNOSIS — Z87440 Personal history of urinary (tract) infections: Secondary | ICD-10-CM | POA: Insufficient documentation

## 2015-04-14 DIAGNOSIS — I10 Essential (primary) hypertension: Secondary | ICD-10-CM | POA: Insufficient documentation

## 2015-04-14 DIAGNOSIS — R300 Dysuria: Secondary | ICD-10-CM | POA: Insufficient documentation

## 2015-04-14 DIAGNOSIS — Z862 Personal history of diseases of the blood and blood-forming organs and certain disorders involving the immune mechanism: Secondary | ICD-10-CM | POA: Insufficient documentation

## 2015-04-14 DIAGNOSIS — E0590 Thyrotoxicosis, unspecified without thyrotoxic crisis or storm: Secondary | ICD-10-CM | POA: Insufficient documentation

## 2015-04-14 DIAGNOSIS — R0602 Shortness of breath: Secondary | ICD-10-CM | POA: Insufficient documentation

## 2015-04-14 DIAGNOSIS — R531 Weakness: Secondary | ICD-10-CM | POA: Insufficient documentation

## 2015-04-14 DIAGNOSIS — Z79899 Other long term (current) drug therapy: Secondary | ICD-10-CM | POA: Insufficient documentation

## 2015-04-14 DIAGNOSIS — M791 Myalgia: Secondary | ICD-10-CM | POA: Insufficient documentation

## 2015-04-14 DIAGNOSIS — F329 Major depressive disorder, single episode, unspecified: Secondary | ICD-10-CM | POA: Insufficient documentation

## 2015-04-14 LAB — BASIC METABOLIC PANEL
ANION GAP: 13 (ref 5–15)
BUN: <5 mg/dL — ABNORMAL LOW (ref 6–20)
CALCIUM: 8.8 mg/dL — AB (ref 8.9–10.3)
CHLORIDE: 102 mmol/L (ref 101–111)
CO2: 20 mmol/L — ABNORMAL LOW (ref 22–32)
CREATININE: 0.82 mg/dL (ref 0.44–1.00)
GFR calc non Af Amer: >60 mL/min (ref >60)
Glucose, Bld: 86 mg/dL (ref 65–99)
Potassium: 3.4 mmol/L — ABNORMAL LOW (ref 3.5–5.1)
SODIUM: 135 mmol/L (ref 135–145)

## 2015-04-14 LAB — I-STAT TROPONIN, ED: TROPONIN I, POC: 0 ng/mL (ref 0.00–0.08)

## 2015-04-14 LAB — CBC
HCT: 38.1 % (ref 36.0–46.0)
HEMOGLOBIN: 13.4 g/dL (ref 12.0–15.0)
MCH: 30.6 pg (ref 26.0–34.0)
MCHC: 35.2 g/dL (ref 30.0–36.0)
MCV: 87 fL (ref 78.0–100.0)
PLATELETS: 328 10*3/uL (ref 150–400)
RBC: 4.38 MIL/uL (ref 3.87–5.11)
RDW: 12.3 % (ref 11.5–15.5)
WBC: 7.3 10*3/uL (ref 4.0–10.5)

## 2015-04-14 NOTE — ED Notes (Signed)
Pt reports diffuse pressure in chest since 3am. sts heart is pounding. Hx hyperthyroid- pt tachycardic. Pt also c.o generalized body aches.

## 2015-04-15 ENCOUNTER — Emergency Department (HOSPITAL_COMMUNITY)
Admission: EM | Admit: 2015-04-15 | Discharge: 2015-04-15 | Disposition: A | Discharge status: Other Acute Inpatient Hospital | Payer: Self-pay | Attending: Emergency Medicine | Admitting: Emergency Medicine

## 2015-04-15 ENCOUNTER — Emergency Department (HOSPITAL_COMMUNITY): Admission: EM | Admit: 2015-04-15 | Discharge: 2015-04-15 | Discharge status: Absent without leave | Payer: Self-pay

## 2015-04-15 ENCOUNTER — Encounter (HOSPITAL_COMMUNITY): Payer: Self-pay | Admitting: Emergency Medicine

## 2015-04-15 ENCOUNTER — Encounter (HOSPITAL_COMMUNITY): Payer: Self-pay

## 2015-04-15 ENCOUNTER — Emergency Department (HOSPITAL_COMMUNITY)
Admission: EM | Admit: 2015-04-15 | Discharge: 2015-04-15 | Disposition: A | Discharge status: Home / Self Care | Payer: Self-pay | Attending: Emergency Medicine | Admitting: Emergency Medicine

## 2015-04-15 ENCOUNTER — Inpatient Hospital Stay (HOSPITAL_COMMUNITY)
Admission: AD | Admit: 2015-04-15 | Discharge: 2015-04-25 | DRG: 885 | Disposition: A | Discharge status: Home / Self Care | Payer: Medicaid Other | Source: Intra-hospital | Attending: Psychiatry | Admitting: Psychiatry

## 2015-04-15 DIAGNOSIS — R079 Chest pain, unspecified: Secondary | ICD-10-CM

## 2015-04-15 DIAGNOSIS — Z79899 Other long term (current) drug therapy: Secondary | ICD-10-CM | POA: Insufficient documentation

## 2015-04-15 DIAGNOSIS — M62838 Other muscle spasm: Secondary | ICD-10-CM | POA: Diagnosis present

## 2015-04-15 DIAGNOSIS — E0590 Thyrotoxicosis, unspecified without thyrotoxic crisis or storm: Secondary | ICD-10-CM | POA: Insufficient documentation

## 2015-04-15 DIAGNOSIS — R4182 Altered mental status, unspecified: Secondary | ICD-10-CM | POA: Insufficient documentation

## 2015-04-15 DIAGNOSIS — F329 Major depressive disorder, single episode, unspecified: Secondary | ICD-10-CM | POA: Insufficient documentation

## 2015-04-15 DIAGNOSIS — F419 Anxiety disorder, unspecified: Secondary | ICD-10-CM | POA: Diagnosis present

## 2015-04-15 DIAGNOSIS — D573 Sickle-cell trait: Secondary | ICD-10-CM | POA: Diagnosis present

## 2015-04-15 DIAGNOSIS — Z87440 Personal history of urinary (tract) infections: Secondary | ICD-10-CM | POA: Insufficient documentation

## 2015-04-15 DIAGNOSIS — N390 Urinary tract infection, site not specified: Secondary | ICD-10-CM | POA: Diagnosis present

## 2015-04-15 DIAGNOSIS — F32A Depression, unspecified: Secondary | ICD-10-CM

## 2015-04-15 DIAGNOSIS — M791 Myalgia, unspecified site: Secondary | ICD-10-CM

## 2015-04-15 DIAGNOSIS — Z833 Family history of diabetes mellitus: Secondary | ICD-10-CM

## 2015-04-15 DIAGNOSIS — Z8249 Family history of ischemic heart disease and other diseases of the circulatory system: Secondary | ICD-10-CM

## 2015-04-15 DIAGNOSIS — Z8719 Personal history of other diseases of the digestive system: Secondary | ICD-10-CM | POA: Insufficient documentation

## 2015-04-15 DIAGNOSIS — F332 Major depressive disorder, recurrent severe without psychotic features: Principal | ICD-10-CM | POA: Diagnosis present

## 2015-04-15 DIAGNOSIS — I10 Essential (primary) hypertension: Secondary | ICD-10-CM | POA: Diagnosis present

## 2015-04-15 DIAGNOSIS — F333 Major depressive disorder, recurrent, severe with psychotic symptoms: Secondary | ICD-10-CM | POA: Diagnosis not present

## 2015-04-15 DIAGNOSIS — F331 Major depressive disorder, recurrent, moderate: Secondary | ICD-10-CM | POA: Diagnosis present

## 2015-04-15 DIAGNOSIS — Z825 Family history of asthma and other chronic lower respiratory diseases: Secondary | ICD-10-CM | POA: Diagnosis not present

## 2015-04-15 DIAGNOSIS — R45850 Homicidal ideations: Secondary | ICD-10-CM | POA: Diagnosis present

## 2015-04-15 DIAGNOSIS — R51 Headache: Secondary | ICD-10-CM | POA: Diagnosis present

## 2015-04-15 DIAGNOSIS — R4689 Other symptoms and signs involving appearance and behavior: Secondary | ICD-10-CM

## 2015-04-15 DIAGNOSIS — Z822 Family history of deafness and hearing loss: Secondary | ICD-10-CM | POA: Diagnosis not present

## 2015-04-15 DIAGNOSIS — R45851 Suicidal ideations: Secondary | ICD-10-CM | POA: Diagnosis present

## 2015-04-15 DIAGNOSIS — F1210 Cannabis abuse, uncomplicated: Secondary | ICD-10-CM | POA: Insufficient documentation

## 2015-04-15 DIAGNOSIS — R002 Palpitations: Secondary | ICD-10-CM

## 2015-04-15 DIAGNOSIS — Z862 Personal history of diseases of the blood and blood-forming organs and certain disorders involving the immune mechanism: Secondary | ICD-10-CM | POA: Insufficient documentation

## 2015-04-15 DIAGNOSIS — R4589 Other symptoms and signs involving emotional state: Secondary | ICD-10-CM

## 2015-04-15 LAB — URINALYSIS, ROUTINE W REFLEX MICROSCOPIC
Bilirubin Urine: NEGATIVE
GLUCOSE, UA: NEGATIVE mg/dL
Nitrite: POSITIVE — AB
PROTEIN: NEGATIVE mg/dL
Specific Gravity, Urine: 1.014 (ref 1.005–1.030)
pH: 6 (ref 5.0–8.0)

## 2015-04-15 LAB — RAPID URINE DRUG SCREEN, HOSP PERFORMED
AMPHETAMINES: NOT DETECTED
BARBITURATES: NOT DETECTED
Benzodiazepines: NOT DETECTED
COCAINE: NOT DETECTED
Opiates: NOT DETECTED
TETRAHYDROCANNABINOL: POSITIVE — AB

## 2015-04-15 LAB — URINE MICROSCOPIC-ADD ON

## 2015-04-15 LAB — ETHANOL: Alcohol, Ethyl (B): <5 mg/dL (ref <5)

## 2015-04-15 MED ORDER — KETOROLAC TROMETHAMINE 30 MG/ML IJ SOLN
30.0000 mg | Freq: Once | INTRAMUSCULAR | Status: AC
  Administered 2015-04-15: 30 mg via INTRAVENOUS
  Filled 2015-04-15: qty 1

## 2015-04-15 MED ORDER — ENSURE ENLIVE PO LIQD
237.0000 mL | Freq: Two times a day (BID) | ORAL | Status: DC
  Administered 2015-04-16 – 2015-04-20 (×9): 237 mL via ORAL

## 2015-04-15 MED ORDER — METOPROLOL TARTRATE 25 MG PO TABS
25.0000 mg | ORAL_TABLET | Freq: Once | ORAL | Status: AC
  Administered 2015-04-15: 25 mg via ORAL
  Filled 2015-04-15: qty 1

## 2015-04-15 MED ORDER — IBUPROFEN 400 MG PO TABS
400.0000 mg | ORAL_TABLET | Freq: Four times a day (QID) | ORAL | Status: DC | PRN
  Administered 2015-04-16 – 2015-04-18 (×3): 400 mg via ORAL
  Filled 2015-04-15 (×3): qty 1

## 2015-04-15 MED ORDER — CITALOPRAM HYDROBROMIDE 40 MG PO TABS
40.0000 mg | ORAL_TABLET | Freq: Every day | ORAL | Status: DC
  Administered 2015-04-16 – 2015-04-25 (×10): 40 mg via ORAL
  Filled 2015-04-15 (×2): qty 1
  Filled 2015-04-15: qty 2
  Filled 2015-04-15 (×4): qty 1
  Filled 2015-04-15: qty 7
  Filled 2015-04-15 (×5): qty 1

## 2015-04-15 MED ORDER — PANTOPRAZOLE SODIUM 40 MG PO TBEC
40.0000 mg | DELAYED_RELEASE_TABLET | Freq: Every day | ORAL | Status: DC
  Administered 2015-04-16 – 2015-04-25 (×10): 40 mg via ORAL
  Filled 2015-04-15: qty 1
  Filled 2015-04-15: qty 7
  Filled 2015-04-15 (×10): qty 1

## 2015-04-15 MED ORDER — BUPROPION HCL ER (XL) 300 MG PO TB24
300.0000 mg | ORAL_TABLET | Freq: Every day | ORAL | Status: DC
  Administered 2015-04-16 – 2015-04-25 (×10): 300 mg via ORAL
  Filled 2015-04-15: qty 7
  Filled 2015-04-15 (×11): qty 1

## 2015-04-15 MED ORDER — METOPROLOL TARTRATE 25 MG PO TABS
12.5000 mg | ORAL_TABLET | Freq: Two times a day (BID) | ORAL | Status: DC
  Administered 2015-04-15: 12.5 mg via ORAL
  Filled 2015-04-15: qty 1

## 2015-04-15 MED ORDER — QUETIAPINE FUMARATE 100 MG PO TABS: 100.0000 mg | ORAL_TABLET | Freq: Every day | ORAL | Status: DC

## 2015-04-15 MED ORDER — CITALOPRAM HYDROBROMIDE 40 MG PO TABS
40.0000 mg | ORAL_TABLET | Freq: Every day | ORAL | Status: DC
  Administered 2015-04-15: 40 mg via ORAL
  Filled 2015-04-15: qty 1

## 2015-04-15 MED ORDER — METHIMAZOLE 10 MG PO TABS
20.0000 mg | ORAL_TABLET | Freq: Two times a day (BID) | ORAL | Status: DC
  Administered 2015-04-15: 20 mg via ORAL
  Filled 2015-04-15 (×2): qty 2

## 2015-04-15 MED ORDER — METHIMAZOLE 10 MG PO TABS
20.0000 mg | ORAL_TABLET | Freq: Two times a day (BID) | ORAL | Status: DC
Start: 2015-04-15 — End: 2015-04-25
  Administered 2015-04-15 – 2015-04-25 (×20): 20 mg via ORAL
  Filled 2015-04-15 (×15): qty 2
  Filled 2015-04-15 (×2): qty 12
  Filled 2015-04-15 (×9): qty 2

## 2015-04-15 MED ORDER — BUPROPION HCL ER (XL) 300 MG PO TB24
300.0000 mg | ORAL_TABLET | Freq: Every day | ORAL | Status: DC
  Administered 2015-04-15: 300 mg via ORAL
  Filled 2015-04-15: qty 1

## 2015-04-15 MED ORDER — HYDROXYZINE HCL 25 MG PO TABS
25.0000 mg | ORAL_TABLET | Freq: Four times a day (QID) | ORAL | Status: DC | PRN
  Administered 2015-04-16 – 2015-04-20 (×9): 25 mg via ORAL
  Filled 2015-04-15 (×9): qty 1

## 2015-04-15 MED ORDER — ACETAMINOPHEN 325 MG PO TABS
650.0000 mg | ORAL_TABLET | Freq: Once | ORAL | Status: AC
  Administered 2015-04-15: 650 mg via ORAL
  Filled 2015-04-15: qty 2

## 2015-04-15 MED ORDER — HYDROXYZINE HCL 25 MG PO TABS: 25.0000 mg | ORAL_TABLET | Freq: Four times a day (QID) | ORAL | Status: DC | PRN

## 2015-04-15 MED ORDER — METOPROLOL TARTRATE 25 MG PO TABS
ORAL_TABLET | ORAL | Status: AC
  Filled 2015-04-15: qty 1

## 2015-04-15 MED ORDER — BUPROPION HCL ER (XL) 150 MG PO TB24
150.0000 mg | ORAL_TABLET | Freq: Every day | ORAL | Status: DC
  Filled 2015-04-15: qty 1

## 2015-04-15 MED ORDER — PANTOPRAZOLE SODIUM 40 MG PO TBEC
40.0000 mg | DELAYED_RELEASE_TABLET | Freq: Every day | ORAL | Status: DC
  Administered 2015-04-15: 40 mg via ORAL
  Filled 2015-04-15: qty 1

## 2015-04-15 MED ORDER — QUETIAPINE FUMARATE 50 MG PO TABS: 75.0000 mg | ORAL_TABLET | Freq: Every day | ORAL | Status: DC

## 2015-04-15 MED ORDER — QUETIAPINE FUMARATE 100 MG PO TABS
100.0000 mg | ORAL_TABLET | Freq: Every day | ORAL | Status: DC
  Administered 2015-04-15 – 2015-04-16 (×2): 100 mg via ORAL
  Filled 2015-04-15 (×5): qty 1

## 2015-04-15 MED ORDER — IBUPROFEN 200 MG PO TABS: 400.0000 mg | ORAL_TABLET | Freq: Four times a day (QID) | ORAL | Status: DC | PRN

## 2015-04-15 MED ORDER — ONDANSETRON HCL 4 MG/2ML IJ SOLN
4.0000 mg | Freq: Once | INTRAMUSCULAR | Status: AC
  Administered 2015-04-15: 4 mg via INTRAVENOUS
  Filled 2015-04-15: qty 2

## 2015-04-15 MED ORDER — ONDANSETRON HCL 4 MG PO TABS
4.0000 mg | ORAL_TABLET | Freq: Four times a day (QID) | ORAL | Status: DC | PRN
  Administered 2015-04-15 – 2015-04-20 (×5): 4 mg via ORAL
  Filled 2015-04-15 (×5): qty 1

## 2015-04-15 MED ORDER — SODIUM CHLORIDE 0.9 % IV BOLUS (SEPSIS)
1500.0000 mL | Freq: Once | INTRAVENOUS | Status: AC
  Administered 2015-04-15: 1500 mL via INTRAVENOUS

## 2015-04-15 MED ORDER — METOPROLOL TARTRATE 25 MG PO TABS
12.5000 mg | ORAL_TABLET | Freq: Two times a day (BID) | ORAL | Status: DC
  Administered 2015-04-15 – 2015-04-25 (×20): 12.5 mg via ORAL
  Filled 2015-04-15: qty 1
  Filled 2015-04-15: qty 7
  Filled 2015-04-15 (×5): qty 1
  Filled 2015-04-15: qty 7
  Filled 2015-04-15 (×19): qty 1

## 2015-04-15 NOTE — ED Provider Notes (Signed)
CSN: [id number]     Arrival date & time 04/15/15  [id number] History   First MD Initiated Contact with Patient 04/15/15 [id number]     Chief Complaint  Patient presents with  . Suicidal     (Consider location/radiation/quality/duration/timing/severity/associated sxs/prior Treatment) Patient is a 29 y.o. female presenting with altered mental status. The history is provided by the patient (The patient states she is depressed musculoskeletal).  Altered Mental Status Presenting symptoms: lethargy   Severity:  Severe Most recent episode:  More than 2 days ago Episode history:  Multiple Timing:  Constant Progression:  Worsening Chronicity:  Recurrent Context: not alcohol use   Associated symptoms: no abdominal pain, no hallucinations, no headaches, no rash and no seizures     Past Medical History  Diagnosis Date  . Sickle cell trait (HCC)   . Hyperthyroidism   . Sickle cell trait (HCC)   . Cholecystitis 08/18/2011  . SVD (spontaneous vaginal delivery)     x 3 [facility]  . Hypertension   . History of recurrent UTIs     recurrent UTIs  . Headache(784.0)     otc med prn  . Joint pain     knees and shoulders - otc med prn   Past Surgical History  Procedure Laterality Date  . Cholecystectomy  08/19/2011    Procedure: LAPAROSCOPIC CHOLECYSTECTOMY WITH INTRAOPERATIVE CHOLANGIOGRAM;  Surgeon: Lodema PilotBrian Layton, DO;  Location: [facility];  Service: General;  Laterality: N/A;  . Dilation and curettage of uterus      molar preg  . Laparoscopic bilateral salpingectomy Bilateral 05/09/2013    Procedure: LAPAROSCOPIC BILATERAL SALPINGECTOMY;  Surgeon: Allie BossierMyra C Dove, MD;  Location: [facility];  Service: Gynecology;  Laterality: Bilateral;   Family History  Problem Relation Age of Onset  . Diabetes Mother   . Cancer Father   . Diabetes Maternal Uncle   . Cancer Maternal Grandmother   . Cancer Maternal Grandfather   . Hypertension Maternal Grandfather   . Diabetes Maternal Grandfather   . Sickle cell anemia Son   .  Asthma Son   . Heart disease Paternal Grandfather   . Hearing loss Paternal Aunt    Social History  Substance Use Topics  . Smoking status: Never Smoker   . Smokeless tobacco: Never Used  . Alcohol Use: No   OB History    Gravida Para Term Preterm AB TAB SAB Ectopic Multiple Living   4 3 3  1  1   3      Review of Systems  Constitutional: Negative for appetite change and fatigue.  HENT: Negative for congestion, ear discharge and sinus pressure.   Eyes: Negative for discharge.  Respiratory: Negative for cough.   Cardiovascular: Negative for chest pain.  Gastrointestinal: Negative for abdominal pain and diarrhea.  Genitourinary: Negative for frequency and hematuria.  Musculoskeletal: Negative for back pain.  Skin: Negative for rash.  Neurological: Negative for seizures and headaches.  Psychiatric/Behavioral: Negative for hallucinations.       Depression suicidal      Allergies  Review of patient's allergies indicates no known allergies.  Home Medications   Prior to Admission medications   Medication Sig Start Date End Date Taking? Authorizing Provider  buPROPion (WELLBUTRIN XL) 150 MG 24 hr tablet Take 1 tablet (150 mg total) by mouth daily. For depression 04/01/15  Yes Sanjuana KavaAgnes I Nwoko, NP  citalopram (CELEXA) 40 MG tablet Take 1 tablet (40 mg total) by mouth daily. For depression 04/01/15  Yes Sanjuana KavaAgnes I Nwoko, NP  hydrOXYzine (ATARAX/VISTARIL) 25 MG tablet Take 1 tablet (25 mg total) by mouth every 6 (six) hours as needed for anxiety. Patient taking differently: Take 25 mg by mouth daily.  04/01/15  Yes Sanjuana Kava, NP  ibuprofen (ADVIL,MOTRIN) 200 MG tablet Take 2 tablets (400 mg total) by mouth every 6 (six) hours as needed for moderate pain. 04/01/15  Yes Sanjuana Kava, NP  methimazole (TAPAZOLE) 10 MG tablet Take 2 tablets (20 mg total) by mouth 2 (two) times daily. For hyperthyroidim 04/01/15  Yes Sanjuana Kava, NP  metoprolol tartrate (LOPRESSOR) 25 MG tablet Take 0.5  tablets (12.5 mg total) by mouth 2 (two) times daily. For high blood pressure 04/01/15  Yes Sanjuana Kava, NP  pantoprazole (PROTONIX) 40 MG tablet Take 1 tablet (40 mg total) by mouth daily. For acid reflux Patient taking differently: Take 40 mg by mouth daily as needed (acid reflux). For acid reflux 04/01/15  Yes Sanjuana Kava, NP  QUEtiapine (SEROQUEL) 25 MG tablet Take 3 tablets (75 mg total) by mouth at bedtime. For depression 04/01/15  Yes Sanjuana Kava, NP   BP 141/88 mmHg  Pulse 96  Temp(Src) 98.1 F (36.7 C) (Oral)  Resp 16  SpO2 99%  LMP 03/20/2015 Physical Exam  Constitutional: She is oriented to person, place, and time. She appears well-developed.  HENT:  Head: Normocephalic.  Eyes: Conjunctivae and EOM are normal. No scleral icterus.  Neck: Neck supple. No thyromegaly present.  Cardiovascular: Normal rate and regular rhythm.  Exam reveals no gallop and no friction rub.   No murmur heard. Pulmonary/Chest: No stridor. She has no wheezes. She has no rales. She exhibits no tenderness.  Abdominal: She exhibits no distension. There is no tenderness. There is no rebound.  Musculoskeletal: Normal range of motion. She exhibits no edema.  Lymphadenopathy:    She has no cervical adenopathy.  Neurological: She is oriented to person, place, and time. She exhibits normal muscle tone. Coordination normal.  Skin: No rash noted. No erythema.  Psychiatric:  Depressed and suicidal    ED Course  Procedures (including critical care time) Labs Review Labs Reviewed  URINE RAPID DRUG SCREEN, HOSP PERFORMED - Abnormal; Notable for the following:    Tetrahydrocannabinol POSITIVE (*)    All other components within normal limits  ETHANOL    Imaging Review Dg Chest 2 View  04/14/2015  CLINICAL DATA:  Chest pain and tachycardia EXAM: CHEST  2 VIEW COMPARISON:  03/26/2014 FINDINGS: The heart size and mediastinal contours are within normal limits. Both lungs are clear. The visualized  skeletal structures are unremarkable. IMPRESSION: No active cardiopulmonary disease. Electronically Signed   By: Signa Kell M.D.   On: 04/14/2015 20:12   I have personally reviewed and evaluated these images and lab results as part of my medical decision-making.   EKG Interpretation None      MDM   Final diagnoses:  None    Patient suicidal. She was seen by behavioral health who states she meets criteria for admission    Bethann Berkshire, MD 04/15/15 1126

## 2015-04-15 NOTE — ED Notes (Addendum)
Pt approached desk stating that she was just discharged but needed to check back in because while she was in the back she expressed to the female nurse that she was suffering from depression and having "those" thoughts and that she was told by the female nurse that when she got discharged she needed to check back in to be seen for her depression. Talked to Onalee Huaavid RN about procedure and decided that she didn't need to check back in and just readmitted her.

## 2015-04-15 NOTE — Progress Notes (Signed)
Patient [facility] room 407-1. Rosey BathKelly Enrique Weiss, RN
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
## 2015-04-15 NOTE — ED Notes (Signed)
TRANSFER TO [facility]. °

## 2015-04-15 NOTE — ED Notes (Signed)
Pt. Notified of need for urine. 

## 2015-04-15 NOTE — ED Notes (Signed)
Belongings placed in [address]

## 2015-04-15 NOTE — Tx Team (Addendum)
Initial Interdisciplinary Treatment Plan   PATIENT STRESSORS: Financial difficulties Marital or family conflict   PATIENT STRENGTHS: Capable of independent living Communication skills   PROBLEM LIST: Problem List/Patient Goals Date to be addressed Date deferred Reason deferred Estimated date of resolution  Depression 04/15/15     Anxiety 04/15/15     "Getting depression down to "0" and medications under control" 04/15/15     "Getting some sleep" 04/15/15     Risk for suicide 04/17/15                              DISCHARGE CRITERIA:  Improved stabilization in mood, thinking, and/or behavior Need for constant or close observation no longer present  PRELIMINARY DISCHARGE PLAN: Outpatient therapy  PATIENT/FAMIILY INVOLVEMENT: This treatment plan has been presented to and reviewed with the patient, Grace [name].  The patient and family have been given the opportunity to ask questions and make suggestions.  Norm ParcelHeather V Reddick 04/15/2015, 4:22 PM

## 2015-04-15 NOTE — ED Notes (Signed)
MD spoke with pt about [facility]pt [facility]. Pt agreeable and states she will go to [facility]. RN gave pt bus pass and her clothes back to be discharged. Pt has new discharge papers with information about [facility]/address and phone number. Pt stable for discharge. RN walked pt out to discharge

## 2015-04-15 NOTE — ED Notes (Signed)
Bed: [facility] Expected date:  Expected time:  Means of arrival:  Comments: t4

## 2015-04-15 NOTE — ED Provider Notes (Signed)
CSN: [id number]     Arrival date & time 04/14/15  1921 History   By signing my name below, I, Arlan Organ, attest that this documentation has been prepared under the direction and in the presence of Azalia Bilis, MD.  Electronically Signed: Arlan Organ, ED Scribe. 04/15/2015. 12:09 AM.   Chief Complaint  Patient presents with  . Chest Pain   The history is provided by the patient. No language interpreter was used.    HPI Comments: Grace [name] is a 29 y.o. female with a PMHx of sickle cell trait and HTN who presents to the Emergency Department complaining of constant, ongoing chest pain that woke her from sleep early this morning at approximately 3:00 AM. Pt also reports shortness of breath, lightheadedness, dysuria, generalized weakness, chills, and myalgias. No aggravating or alleviating factors at this time. No OTC medications or home remedies attempted prior to arrival. No recent fever, vomiting, diarrhea, or abdominal pain. She denies any previous history of same.  PCP: [facility]    Past Medical History  Diagnosis Date  . Sickle cell trait (HCC)   . Hyperthyroidism   . Sickle cell trait (HCC)   . Cholecystitis 08/18/2011  . SVD (spontaneous vaginal delivery)     x 3 [facility]  . Hypertension   . History of recurrent UTIs     recurrent UTIs  . Headache(784.0)     otc med prn  . Joint pain     knees and shoulders - otc med prn   Past Surgical History  Procedure Laterality Date  . Cholecystectomy  08/19/2011    Procedure: LAPAROSCOPIC CHOLECYSTECTOMY WITH INTRAOPERATIVE CHOLANGIOGRAM;  Surgeon: Lodema Pilot, DO;  Location: [facility] OR;  Service: General;  Laterality: N/A;  . Dilation and curettage of uterus      molar preg  . Laparoscopic bilateral salpingectomy Bilateral 05/09/2013    Procedure: LAPAROSCOPIC BILATERAL SALPINGECTOMY;  Surgeon: Allie Bossier, MD;  Location: [facility] ORS;  Service: Gynecology;  Laterality: Bilateral;   Family History  Problem  Relation Age of Onset  . Diabetes Mother   . Cancer Father   . Diabetes Maternal Uncle   . Cancer Maternal Grandmother   . Cancer Maternal Grandfather   . Hypertension Maternal Grandfather   . Diabetes Maternal Grandfather   . Sickle cell anemia Son   . Asthma Son   . Heart disease Paternal Grandfather   . Hearing loss Paternal Aunt    Social History  Substance Use Topics  . Smoking status: Never Smoker   . Smokeless tobacco: Never Used  . Alcohol Use: No   OB History    Gravida Para Term Preterm AB TAB SAB Ectopic Multiple Living   Review of Systems      Allergies  Review of patient's allergies indicates no known allergies.  Home Medications   Prior to Admission medications   Medication Sig Start Date End Date Taking? Authorizing Provider  buPROPion (WELLBUTRIN XL) 150 MG 24 hr tablet Take 1 tablet (150 mg total) by mouth daily. For depression 04/01/15   Sanjuana Kava, NP  citalopram (CELEXA) 40 MG tablet Take 1 tablet (40 mg total) by mouth daily. For depression 04/01/15   Sanjuana Kava, NP  hydrOXYzine (ATARAX/VISTARIL) 25 MG tablet Take 1 tablet (25 mg total) by mouth every 6 (six) hours as needed for anxiety. 04/01/15   Sanjuana Kava, NP  ibuprofen (ADVIL,MOTRIN) 200 MG tablet Take 2 tablets (400 mg total) by mouth every 6 (six) hours as needed for moderate pain. 04/01/15   Sanjuana KavaAgnes I Nwoko, NP  methimazole (TAPAZOLE) 10 MG tablet Take 2 tablets (20 mg total) by mouth 2 (two) times daily. For hyperthyroidim 04/01/15   Sanjuana KavaAgnes I Nwoko, NP  metoprolol tartrate (LOPRESSOR) 25 MG tablet Take 0.5 tablets (12.5 mg total) by mouth 2 (two) times daily. For high blood pressure 04/01/15   Sanjuana KavaAgnes I Nwoko, NP  pantoprazole (PROTONIX) 40 MG tablet Take 1 tablet (40 mg total) by mouth daily. For acid reflux 04/01/15   Sanjuana KavaAgnes I Nwoko, NP  QUEtiapine (SEROQUEL) 25 MG tablet Take 3 tablets (75 mg total) by mouth at bedtime. For depression 04/01/15   Sanjuana KavaAgnes I Nwoko, NP    Triage Vitals: BP 128/80 mmHg  Pulse 122  Temp(Src) 99.3 F (37.4 C) (Oral)  Resp 22  Ht 5\' 4"  (1.626 m)  Wt 148 lb 9.6 oz (67.405 kg)  BMI 25.49 kg/m2  SpO2 100%  LMP 03/20/2015   Physical Exam  Constitutional: She is oriented to person, place, and time. She appears well-developed and well-nourished. No distress.  HENT:  Head: Normocephalic and atraumatic.  Eyes: EOM are normal.  Neck: Normal range of motion.  Cardiovascular: Regular rhythm and normal heart sounds.  Tachycardia present.   Pulmonary/Chest: Effort normal and breath sounds normal.  Abdominal: Soft. She exhibits no distension. There is no tenderness.  Musculoskeletal: Normal range of motion.  Neurological: She is alert and oriented to person, place, and time.  Skin: Skin is warm and dry.  Psychiatric: She has a normal mood and affect. Judgment normal.  Nursing note and vitals reviewed.   ED Course  Procedures (including critical care time)  DIAGNOSTIC STUDIES: Oxygen Saturation is 100% on RA, Normal by my interpretation.    COORDINATION OF CARE: 12:38 AM- Will give fluids, Toradol, Zofran, and Tylenol. Will order CXR, urinalysis, CBC, BMP, i-stat troponin, and EKG. Discussed treatment plan with pt at bedside and pt agreed to plan.     Labs Review Labs Reviewed  BASIC METABOLIC PANEL - Abnormal; Notable for the following:    Potassium 3.4 (*)    CO2 20 (*)    BUN <5 (*)    Calcium 8.8 (*)    All other components within normal limits  CBC  I-STAT TROPOININ, ED    Imaging Review Dg Chest 2 View  04/14/2015  CLINICAL DATA:  Chest pain and tachycardia EXAM: CHEST  2 VIEW COMPARISON:  03/26/2014 FINDINGS: The heart size and mediastinal contours are within normal limits. Both lungs are clear. The visualized skeletal structures are unremarkable. IMPRESSION: No active cardiopulmonary disease. Electronically Signed   By: Signa Kellaylor  Stroud M.D.   On: 04/14/2015 20:12   I have personally reviewed and evaluated  these images and lab results as part of my medical decision-making.   EKG Interpretation None      MDM   Final diagnoses:  None    Nonspecific chest pain.  Doubt PE.  Doubt ACS.  Overall well-appearing.  Workup of emergency department without significant abnormality.  7:17 AM Patient reports depression with occasional vague suicidal thoughts.  She has no plan at this time.  She has been hospitalized at behavioral health.  She is without hallucinations.  [facility]'ll be [facility].  I do not think she needs acute hospitalization.  I do not believe the patient is a threat to herself or  others.  This is a issue that the patient rose after approximately 11 hours in the ER.   I personally performed the services described in this documentation, which was scribed in my presence. The recorded information has been reviewed and is accurate.       Azalia Bilis, MD 04/15/15 [phone]

## 2015-04-15 NOTE — Discharge Instructions (Signed)
Nonspecific Chest Pain  °Chest pain can be caused by many different conditions. There is always a chance that your pain could be related to something serious, such as a heart attack or a blood clot in your lungs. Chest pain can also be caused by conditions that are not life-threatening. If you have chest pain, it is very important to follow up with your health care provider. °CAUSES  °Chest pain can be caused by: °· Heartburn. °· Pneumonia or bronchitis. °· Anxiety or stress. °· Inflammation around your heart (pericarditis) or lung (pleuritis or pleurisy). °· A blood clot in your lung. °· A collapsed lung (pneumothorax). It can develop suddenly on its own (spontaneous pneumothorax) or from trauma to the chest. °· Shingles infection (varicella-zoster virus). °· Heart attack. °· Damage to the bones, muscles, and cartilage that make up your chest wall. This can include: °· Bruised bones due to injury. °· Strained muscles or cartilage due to frequent or repeated coughing or overwork. °· Fracture to one or more ribs. °· Sore cartilage due to inflammation (costochondritis). °RISK FACTORS  °Risk factors for chest pain may include: °· Activities that increase your risk for trauma or injury to your chest. °· Respiratory infections or conditions that cause frequent coughing. °· Medical conditions or overeating that can cause heartburn. °· Heart disease or family history of heart disease. °· Conditions or health behaviors that increase your risk of developing a blood clot. °· Having had chicken pox (varicella zoster). °SIGNS AND SYMPTOMS °Chest pain can feel like: °· Burning or tingling on the surface of your chest or deep in your chest. °· Crushing, pressure, aching, or squeezing pain. °· Dull or sharp pain that is worse when you move, cough, or take a deep breath. °· Pain that is also felt in your back, neck, shoulder, or arm, or pain that spreads to any of these areas. °Your chest pain may come and go, or it may stay  constant. °DIAGNOSIS °Lab tests or other studies may be needed to find the cause of your pain. Your health care provider may have you take a test called an ambulatory ECG (electrocardiogram). An ECG records your heartbeat patterns at the time the test is performed. You may also have other tests, such as: °· Transthoracic echocardiogram (TTE). During echocardiography, sound waves are used to create a picture of all of the heart structures and to look at how blood flows through your heart. °· Transesophageal echocardiogram (TEE). This is a more advanced imaging test that obtains images from inside your body. It allows your health care provider to see your heart in finer detail. °· Cardiac monitoring. This allows your health care provider to monitor your heart rate and rhythm in real time. °· Holter monitor. This is a portable device that records your heartbeat and can help to diagnose abnormal heartbeats. It allows your health care provider to track your heart activity for several days, if needed. °· Stress tests. These can be done through exercise or by taking medicine that makes your heart beat more quickly. °· Blood tests. °· Imaging tests. °TREATMENT  °Your treatment depends on what is causing your chest pain. Treatment may include: °· Medicines. These may include: °· Acid blockers for heartburn. °· Anti-inflammatory medicine. °· Pain medicine for inflammatory conditions. °· Antibiotic medicine, if an infection is present. °· Medicines to dissolve blood clots. °· Medicines to treat coronary artery disease. °· Supportive care for conditions that do not require medicines. This may include: °· Resting. °· Applying heat   or cold packs to injured areas. °· Limiting activities until pain decreases. °HOME CARE INSTRUCTIONS °· If you were prescribed an antibiotic medicine, finish it all even if you start to feel better. °· Avoid any activities that bring on chest pain. °· Do not use any tobacco products, including  cigarettes, chewing tobacco, or electronic cigarettes. If you need help quitting, ask your health care provider. °· Do not drink alcohol. °· Take medicines only as directed by your health care provider. °· Keep all follow-up visits as directed by your health care provider. This is important. This includes any further testing if your chest pain does not go away. °· If heartburn is the cause for your chest pain, you may be told to keep your head raised (elevated) while sleeping. This reduces the chance that acid will go from your stomach into your esophagus. °· Make lifestyle changes as directed by your health care provider. These may include: °· Getting regular exercise. Ask your health care provider to suggest some activities that are safe for you. °· Eating a heart-healthy diet. A registered dietitian can help you to learn healthy eating options. °· Maintaining a healthy weight. °· Managing diabetes, if necessary. °· Reducing stress. °SEEK MEDICAL CARE IF: °· Your chest pain does not go away after treatment. °· You have a rash with blisters on your chest. °· You have a fever. °SEEK IMMEDIATE MEDICAL CARE IF:  °· Your chest pain is worse. °· You have an increasing cough, or you cough up blood. °· You have severe abdominal pain. °· You have severe weakness. °· You faint. °· You have chills. °· You have sudden, unexplained chest discomfort. °· You have sudden, unexplained discomfort in your arms, back, neck, or jaw. °· You have shortness of breath at any time. °· You suddenly start to sweat, or your skin gets clammy. °· You feel nauseous or you vomit. °· You suddenly feel light-headed or dizzy. °· Your heart begins to beat quickly, or it feels like it is skipping beats. °These symptoms may represent a serious problem that is an emergency. Do not wait to see if the symptoms will go away. Get medical help right away. Call your local emergency services (911 in the [place] Do not drive yourself to the hospital. °  °This  information is not intended to replace advice given to you by your health care provider. Make sure you discuss any questions you have with your health care provider. °  °Document Released: 01/13/2005 Document Revised: 04/26/2014 Document Reviewed: 11/09/2013 °Elsevier Interactive Patient Education ©2016 Else[employer] Inc[employer] ° °Palpitations °A palpitation is the feeling that your heartbeat is irregular or is faster than normal. It may feel like your heart is fluttering or skipping a beat. Palpitations are usually not a serious problem. However, in some cases, you may need further medical evaluation. °CAUSES  °Palpitations can be caused by: °· Smoking. °· Caffeine or other stimulants, such as diet pills or energy drinks. °· Alcohol. °· Stress and anxiety. °· Strenuous physical activity. °· Fatigue. °· Certain medicines. °· Heart disease, especially if you have a history of irregular heart rhythms (arrhythmias), such as atrial fibrillation, atrial flutter, or supraventricular tachycardia. °· An improperly working pacemaker or defibrillator. °DIAGNOSIS  °To find the cause of your palpitations, your health care provider will take your medical history and perform a physical exam. Your health care provider may also have you take a test called an ambulatory electrocardiogram (ECG). An ECG records your heartbeat patterns over a 24-hour   period. You may also have other tests, such as: °· Transthoracic echocardiogram (TTE). During echocardiography, sound waves are used to evaluate how blood flows through your heart. °· Transesophageal echocardiogram (TEE). °· Cardiac monitoring. This allows your health care provider to monitor your heart rate and rhythm in real time. °· Holter monitor. This is a portable device that records your heartbeat and can help diagnose heart arrhythmias. It allows your health care provider to track your heart activity for several days, if needed. °· Stress tests by exercise or by giving medicine that makes the  heart beat faster. °TREATMENT  °Treatment of palpitations depends on the cause of your symptoms and can vary greatly. Most cases of palpitations do not require any treatment other than time, relaxation, and monitoring your symptoms. Other causes, such as atrial fibrillation, atrial flutter, or supraventricular tachycardia, usually require further treatment. °HOME CARE INSTRUCTIONS  °· Avoid: °¨ Caffeinated coffee, tea, soft drinks, diet pills, and energy drinks. °¨ Chocolate. °¨ Alcohol. °· Stop smoking if you smoke. °· Reduce your stress and anxiety. Things that can help you relax include: °¨ A method of controlling things in your body, such as your heartbeats, with your mind (biofeedback). °¨ Yoga. °¨ Meditation. °¨ Physical activity such as swimming, jogging, or walking. °· Get plenty of rest and sleep. °SEEK MEDICAL CARE IF:  °· You continue to have a fast or irregular heartbeat beyond 24 hours. °· Your palpitations occur more often. °SEEK IMMEDIATE MEDICAL CARE IF: °· You have chest pain or shortness of breath. °· You have a severe headache. °· You feel dizzy or you faint. °MAKE SURE YOU: °· Understand these instructions. °· Will watch your condition. °· Will get help right away if you are not doing well or get worse. °  °This information is not intended to replace advice given to you by your health care provider. Make sure you discuss any questions you have with your health care provider. °  °Document Released: 04/02/2000 Document Revised: 04/10/2013 Document Reviewed: 06/04/2011 °Elsevier Interactive Patient Education ©2016 Elsevier Inc. ° °
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
## 2015-04-15 NOTE — BH Assessment (Signed)
[facility] Assessment Progress Note  Per Thedore MinsMojeed Akintayo, MD, this pt requires psychiatric hospitalization at this time.  Rosey BathKelly Southard, RN, [id number].  Pt has signed Voluntary Admission and Consent for Treatment, as well as Consent to Release Information, and signed forms [facility].  Pt's nurse, Morrie Sheldonshley, has been notified, and agrees to send original paperwork along with pt via Juel [place], and to call report to [phone].  Doylene Canninghomas Dylann Layne, MA Triage Specialist [phone]

## 2015-04-15 NOTE — ED Notes (Addendum)
Staffing called for sitter. Patient in placed scrubs Security called to wand patient.

## 2015-04-15 NOTE — ED Notes (Signed)
Discharge instructions reviewed - voiced understanding 

## 2015-04-15 NOTE — ED Notes (Signed)
Wanding done by security.

## 2015-04-15 NOTE — ED Notes (Signed)
STAFFING AND LEADERSHIP ItalyHAD QUEEN RN MADE AWARE OF SI AND NEED FOR SITTER..Marland Kitchen

## 2015-04-15 NOTE — Progress Notes (Signed)
Admission Note: Grace [name] was admitted from [facility] with increasing symptoms of depression over the last month.  She is exhibiting poor sleep, poor appetite, depression, crying spells, isolation, anhedonia, irritability and feeling hopeless.  She states that she cut on herself (left wrist) two days ago.  Very small scratch noted.  She states that she has a history of cutting.  She admits to having suicidal thoughts with no plan and is able to contract for safety on the unit.  She admits to homicidal thoughts towards "by child's father."  She states that her goal for this admission is to "get my depression down to a 0 and getting some sleep."  Completed admission paperwork.  Belongings searched and secured in locker # 2.  Oriented her to the unit.  Q 15 minute checks initiated for safety.  We will continue to monitor the progress towards her goals.

## 2015-04-16 ENCOUNTER — Encounter (HOSPITAL_COMMUNITY): Payer: Self-pay | Admitting: Psychiatry

## 2015-04-16 DIAGNOSIS — R45851 Suicidal ideations: Secondary | ICD-10-CM

## 2015-04-16 DIAGNOSIS — F333 Major depressive disorder, recurrent, severe with psychotic symptoms: Secondary | ICD-10-CM

## 2015-04-16 DIAGNOSIS — R45850 Homicidal ideations: Secondary | ICD-10-CM

## 2015-04-16 NOTE — Progress Notes (Signed)
Initial Nutrition Assessment  DOCUMENTATION CODES:   Not applicable  INTERVENTION:  -No RD Interventions requested at this time  NUTRITION DIAGNOSIS:   Inadequate oral intake related to chronic illness as evidenced by per patient/family report.  GOAL:   Patient will meet greater than or equal to 90% of their needs  MONITOR:   PO intake, Supplement acceptance, I & O's, Labs  REASON FOR ASSESSMENT:   Malnutrition Screening Tool    ASSESSMENT:   29 year old female, states she has been dealing with depression for " a long time on and off ". States that over the last 2 weeks or so her depression has been worsening, with increased sadness, neuro- vegetative symptoms as listed below, and has developed suicidal ideations, such as fantasizing about cutting self or walking into traffic .  Spoke with pt. Pt reports poor appetite on and off related to depressive symptoms. States when she feels down she just doesn't eat. May skip multiple meals in a day. Pt is not concerned with weight, and has declined supplements at this time. Per chart pt has 6#/4% insignificant wt loss in 3 months.  Nutrition-Focused physical exam completed. Findings are no fat depletion, no muscle depletion, and no edema.   Labs and Medications reviewed  Diet Order:     Skin:  Reviewed, no issues  Last BM:  04/10/2015  Height:   Ht Readings from Last 1 Encounters:  04/15/15 5' 4.25" (1.632 m)    Weight:   Wt Readings from Last 1 Encounters:  04/15/15 148 lb (67.132 kg)    Ideal Body Weight:  54.54 kg  BMI:  Body mass index is 25.21 kg/(m^2).  Estimated Nutritional Needs:   Kcal:  1400-1700 calories  Protein:  60-70 grams  Fluid:  >/= 1.4L  EDUCATION NEEDS:   No education needs identified at this time  Grace AnoWilliam M. Lee Kalt, MS, RD LDN After Hours/Weekend Pager [phone]

## 2015-04-16 NOTE — Progress Notes (Signed)
Adult Psychoeducational Group Note  Date:  04/16/2015 Time:  12:03 AM  Group Topic/Focus:  Wrap-Up Group:   The focus of this group is to help patients review their daily goal of treatment and discuss progress on daily workbooks.  Participation Level:  Did Not Attend  Additional Comments:  Pt did not attend group.   Berlin Hunuttle, Gurney Balthazor M 04/16/2015, 12:03 AM

## 2015-04-16 NOTE — [facility] Group Notes (Signed)
[facility] LCSW Aftercare Discharge Planning Group Note  04/16/2015 8:45 AM  Pt did not attend, declined invitation.   Roniyah Llorens Carter, LCSWA 04/16/2015 10:24 AM  

## 2015-04-16 NOTE — Progress Notes (Signed)
D: Pt denies SI/HI/AVH. Pt is pleasant and cooperative. Pt stated the same thing got me back in here. Pt presents very sad and depressed. Pt forwards little, isolates to self  A: Pt was offered support and encouragement. Pt was given scheduled medications. Pt was encourage to attend groups. Q 15 minute checks were done for safety.   R:. Pt is taking medication. Pt has no complaints.Pt receptive to treatment and safety maintained on unit.
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
## 2015-04-16 NOTE — [facility] Group Notes (Signed)
[facility] LCSW Group Therapy 04/16/2015 1:15 PM  Type of Therapy: Group Therapy- Emotion Regulation  Participation Level: Minimal  Participation Quality:  Inattentive  Affect: Flat  Cognitive: Alert and Oriented   Insight:  Developing/Improving  Engagement in Therapy: Limited   Modes of Intervention: Clarification, Confrontation, Discussion, Education, Exploration, Limit-setting, Orientation, Problem-solving, Rapport Building, Dance movement psychotherapisteality Testing, Socialization and Support  Summary of Progress/Problems: The topic for group today was emotional regulation. This group focused on both positive and negative emotion identification and allowed group members to process ways to identify feelings, regulate negative emotions, and find healthy ways to manage internal/external emotions. Group members were asked to reflect on a time when their reaction to an emotion led to a negative outcome and explored how alternative responses using emotion regulation would have benefited them. Group members were also asked to discuss a time when emotion regulation was utilized when a negative emotion was experienced. Pt did not participate in group discussion and was observed to be withdrawn from group AEB body language.   Grace CordialLauren Carter, LCSWA 04/16/2015 4:58 PM

## 2015-04-16 NOTE — [facility] Suicide Risk Assessment (Signed)
[facility]   Nursing information obtained from:    patient and chart  Demographic factors:   29 year old female, unemployed  Current Mental Status:   see below  Loss Factors:   relationship stressors Historical Factors:   history of depression, history of hyperthyroidism  Risk Reduction Factors:   sense of responsibility to children/family Total Time spent with patient: 45 minutes Principal Problem:  Major Depression, Recurrent, Severe Diagnosis:   Patient Active Problem List   Diagnosis Date Noted  . MDD (major depressive disorder), recurrent episode, severe (HCC) [F33.2] 04/15/2015  . Hyperthyroidism [E05.90] 03/21/2015  . Tachycardia [R00.0] 03/21/2015  . Palpitations [R00.2] 03/21/2015  . Chest pain [R07.9] 03/21/2015  . MDD (major depressive disorder), recurrent episode, moderate (HCC) [F33.1] 03/16/2015  . Consultation for female sterilization [Z30.09] 05/09/2013  . NSVD (normal spontaneous vaginal delivery) [O80] 03/30/2013  . Chronic hypertension in pregnancy [O10.919] 03/29/2013  . HTN in pregnancy, chronic [O10.919] 03/28/2013  . Transient hypertension of pregnancy, antepartum [O13.9] 03/20/2013  . GERD (gastroesophageal reflux disease) [K21.9] 01/18/2013  . Hyperemesis gravidarum before end of [redacted] week gestation with electrolyte imbalance [O21.1] 09/23/2012  . Trichomonas [A59.9] 09/23/2012  . Supervision of high-risk pregnancy [O09.90] 04/19/2011  . History of molar pregnancy, antepartum [O09.A0] 03/22/2011  . Hyperthyroidism, antepartum [Z61.096[O99.280, E07.9] 03/16/2011  . Sickle cell trait (HCC) [D57.3] 12/30/2010     Continued Clinical Symptoms:  Alcohol Use Disorder Identification Test Final Score (AUDIT): 0 The "Alcohol Use Disorders Identification Test", Guidelines for Use in Primary Care, Second Edition.  World Science writerHealth Organization [facility](WHO). Score between 0-7:  no or low risk or alcohol related problems. Score between 8-15:  moderate risk of  alcohol related problems. Score between 16-19:  high risk of alcohol related problems. Score 20 or above:  warrants further diagnostic evaluation for alcohol dependence and treatment.   CLINICAL FACTORS:  29 year old female, known to our unit from recent psychiatric admission, reports exacerbation of depression leading to readmission     Psychiatric Specialty Exam: Physical Exam  ROS  Blood pressure 100/72, pulse 84, temperature 98.5 F (36.9 C), temperature source Oral, resp. rate 16, height 5' 4.25" (1.632 m), weight 148 lb (67.132 kg), last menstrual period 03/20/2015, SpO2 100 %.Body mass index is 25.21 kg/(m^2).   see admit note MSE                                                        COGNITIVE FEATURES THAT CONTRIBUTE TO RISK:  Closed-mindedness and Loss of executive function    SUICIDE RISK:   Moderate:  Frequent suicidal ideation with limited intensity, and duration, some specificity in terms of plans, no associated intent, good self-control, limited dysphoria/symptomatology, some risk factors present, and identifiable protective factors, including available and accessible social support.  PLAN OF CARE: Patient will be admitted to inpatient psychiatric unit for stabilization and safety. Will provide and encourage milieu participation. Provide medication management and maked adjustments as needed.  Will follow daily.    Medical Decision Making:  Review of Psycho-Social Stressors (1), Review or order clinical lab tests (1) and Established Problem, Worsening (2)  I certify that inpatient services furnished can reasonably be expected to improve the patient's condition.   Piper Albro 04/16/2015, 7:06 PM

## 2015-04-16 NOTE — Progress Notes (Signed)
D:  Patient's self inventory sheet, patient has fair sleep, sleep medication is helpful.  Poor appetite, low energy level, poor concentration.  Rated depression and hopeless 9, anxiety 10.  Denied withdrawals.  SI, contracts for safety.  Physical problems, headaches, joint pain.  Medication is not helpful.  Goal is talking.  Plansto attend groups today.  No discharge plans. A:  Medications administered per MD orders.  Emotional support and encouragement given patient. R:  SI, no plan, contracts for safety.  HI to child's dad, contracts for safety.  Does see shadows that call her name.  Safety maintained with 15 minute checks.

## 2015-04-16 NOTE — Plan of Care (Signed)
Problem: Consults Goal: Depression Patient Education See Patient Education Module for education specifics.  Outcome: Progressing Nurse discussed depression/coping skills with patient.        

## 2015-04-17 LAB — URINALYSIS, ROUTINE W REFLEX MICROSCOPIC
Bilirubin Urine: NEGATIVE
Glucose, UA: NEGATIVE mg/dL
Ketones, ur: NEGATIVE mg/dL
Nitrite: POSITIVE — AB
Protein, ur: NEGATIVE mg/dL
Specific Gravity, Urine: 1.012 (ref 1.005–1.030)
pH: 6 (ref 5.0–8.0)

## 2015-04-17 LAB — LIPID PANEL
Cholesterol: 112 mg/dL (ref 0–200)
HDL: 31 mg/dL — ABNORMAL LOW
LDL Cholesterol: 59 mg/dL (ref 0–99)
Total CHOL/HDL Ratio: 3.6 ratio
Triglycerides: 110 mg/dL
VLDL: 22 mg/dL (ref 0–40)

## 2015-04-17 LAB — URINE MICROSCOPIC-ADD ON

## 2015-04-17 MED ORDER — QUETIAPINE FUMARATE 50 MG PO TABS
150.0000 mg | ORAL_TABLET | Freq: Every day | ORAL | Status: DC
  Administered 2015-04-17 – 2015-04-19 (×3): 150 mg via ORAL
  Filled 2015-04-17 (×7): qty 1

## 2015-04-17 NOTE — Progress Notes (Signed)
D:  Patient's self inventory sheet, patient has poor sleep, sleep medication was not helpful.  Poor appetite, low energy level, poor concentration.  Rated depression 9, hopeless 8, anxiety 10.  Denied withdrawals.  SI "almost all of the time".  Has experienced in the past 24 hours, dizziness and headaches.  Denied physical pain.  Goal is "no isolation".  Plans to attend groups.  No discharge plans. A:  Medications administered per MD orders.  Emotional support and encouragement given patient. R:  Denied HI.  Denied auditory hallucinations this morning.  Stated she does have suicidal thoughts, no plan, contracts for safety.  Stated she is seeing shadows this morning. Patient did not attend 0900 group meeting.  Patient has been staying in bed most of the morning.

## 2015-04-17 NOTE — Progress Notes (Signed)
Adult Psychoeducational Group Note  Date:  04/17/2015 Time:  1:38 AM  Group Topic/Focus:  Wrap-Up Group:   The focus of this group is to help patients review their daily goal of treatment and discuss progress on daily workbooks.  Participation Level:  Active  Participation Quality:  Appropriate  Affect:  Appropriate and Flat  Cognitive:  Appropriate  Insight: Appropriate  Engagement in Group:  Engaged  Modes of Intervention:  Discussion  Additional Comments:  Pt was appropriate but a bit flat at times. Pt rated day a 4, saying she was up and down today. At the time of group, pt said she was down. Pt said she is working on getting meds under control, and a goal for herself is to not isolate.   Burman FreestoneCraddock, Grace [name] 04/17/2015, 1:38 AM

## 2015-04-17 NOTE — Plan of Care (Signed)
Problem: Ineffective individual coping Goal: LTG: Patient will report a decrease in negative feelings Outcome: Not Progressing Pt stated she felt about the same as yesterday

## 2015-04-17 NOTE — Progress Notes (Signed)
Adult Psychoeducational Group Note  Date:  04/17/2015 Time:  11:09 PM  Group Topic/Focus:  Wrap-Up Group:   The focus of this group is to help patients review their daily goal of treatment and discuss progress on daily workbooks.  Participation Level:  Active  Participation Quality:  Appropriate  Affect:  Appropriate  Cognitive:  Alert  Insight: Appropriate  Engagement in Group:  Engaged  Modes of Intervention:  Discussion  Additional Comments:  Patient stated her goal for today was to not isolate herself. Patient stated something positive that happened today was her waking up this morning.   [name] L [name] 04/17/2015, 11:09 PM
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
## 2015-04-17 NOTE — [facility] Suicide Risk Assessment (Signed)
[facility] INPATIENT:  Family/Significant Other Suicide Prevention Education  Suicide Prevention Education:  Patient Refusal for Family/Significant Other Suicide Prevention Education: The patient Grace [name] has refused to provide written consent for family/significant other to be provided Family/Significant Other Suicide Prevention Education during admission and/or prior to discharge.  Physician notified. SPE reviewed with patient and brochure provided. Patient encouraged to return to hospital if having suicidal thoughts, patient verbalized his/her understanding and has no further questions at this time.   Elaina HoopsCarter, Rithwik Schmieg M 04/17/2015, 11:07 AM

## 2015-04-17 NOTE — [facility] Group Notes (Signed)
[facility] Group Therapy 04/17/2015 1:15pm  Type of Therapy: Mental Health Association Presentation  Participation Level: Active  Participation Quality: Attentive  Affect: Appropriate  Cognitive: Oriented  Insight: Developing/Improving  Engagement in Therapy: Engaged  Modes of Intervention: Discussion, Education and Socialization  Summary of Progress/Problems: Mental Health Association (MHA) Speaker came to talk about his personal journey with substance abuse and addiction. The pt processed ways by which to relate to the speaker. MHA speaker provided handouts and educational information pertaining to groups and services offered by the MHA. Pt was engaged in speaker's presentation and was receptive to resources provided.    Juanda Luba, MSW, LCSW Clinical Social Worker   

## 2015-04-17 NOTE — Progress Notes (Signed)
Patient ID: Grace [name], female   DOB: 11/21/1985, 29 y.o.   MRN: [id number] Adult Psychoeducational Group Note  Date:  04/17/2015 Time:  09:00am  Group Topic/Focus:  Self Care:   The focus of this group is to help patients understand the importance of self-care in order to improve or restore emotional, physical, spiritual, interpersonal, and financial health.  Participation Level:  Did Not Attend  Participation Quality: n/a  Affect:n/a  Cognitive: n/a  Insight: n/a  Engagement in Group:  n/a  Modes of Intervention:  Activity, Discussion, Education and Support  Additional Comments:  Pt did not attend group. Pt in bed asleep.   Aurora Maskwyman, Chrisha Vogel E 04/17/2015, 11:50 AM
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
## 2015-04-17 NOTE — Plan of Care (Signed)
Problem: Consults Goal: Anxiety Disorder Patient Education See Patient Education Module for eduction specifics.  Outcome: Progressing Nurse discussed depression/anxiety/coping skills with patient.        

## 2015-04-17 NOTE — Progress Notes (Signed)
D: Pt  denies . Pt is pleasant and cooperative. Pt appeared depress. Pt stated she will try to used coping methods learned in here to help on the outside. Pt stated she feels the same as yesterday.  A: Pt was offered support and encouragement. Pt was given scheduled medications. Pt was encourage to attend groups. Q 15 minute checks were done for safety.   R:Pt attends groups and interacts well with peers and staff. Pt is taking medication. Pt has no complaints.Pt receptive to treatment and safety maintained on unit.

## 2015-04-18 LAB — HEMOGLOBIN A1C
Hgb A1c MFr Bld: 5.4 % (ref 4.8–5.6)
MEAN PLASMA GLUCOSE: 108 mg/dL

## 2015-04-18 LAB — PREGNANCY, URINE: PREG TEST UR: NEGATIVE

## 2015-04-18 MED ORDER — NITROFURANTOIN MACROCRYSTAL 50 MG PO CAPS
50.0000 mg | ORAL_CAPSULE | Freq: Four times a day (QID) | ORAL | Status: DC
  Administered 2015-04-18 – 2015-04-25 (×26): 50 mg via ORAL
  Filled 2015-04-18 (×34): qty 1

## 2015-04-18 NOTE — Progress Notes (Signed)
Patient ID: Grace [name], female   DOB: 25-Aug-1985, 29 y.o.   MRN: [id number] [facility] MD Progress Note  04/18/2015 4:55 PM [place]  MRN:  [id number] Subjective:   Patient reports nausea and describes fair appetite.  Denies vomiting. Denies diarrhea.  Objective : I have discussed case with treatment team and have met with patient . Patient more visible on unit, going to some groups, but tends to be reserved, and states she tends to " keep to myself ", particularly when she does not know people well. Patient presents somewhat improved compared to admission- she is better groomed, has better eye contact, and although continues to report feeling depressed, presents with a fuller range of affect and smiles appropriately at times . States she has ongoing passive suicidal ideations- " like I think I am tired of living". Denies plan or intention of hurting self , contracts for safety. Of note, today describes subtle /vague dysuria at times, and also, as above, reports nausea. No fever, no chills, no fever . No disruptive or agitated behaviors on unit . Nausea may be side effect from  Medication- she has been on these medications in the past with no side effects, but is currently at higher dose of Seroquel and Wellbutrin XL. We discussed, prefers to continue current doses (  rather than taper)  as she feels meds are currently  helping    Principal Problem: MDD (major depressive disorder), recurrent episode, severe ([place]) Diagnosis:   Patient Active Problem List   Diagnosis Date Noted  . MDD (major depressive disorder), recurrent episode, severe ([place]) [F33.2] 04/15/2015  . Hyperthyroidism [E05.90] 03/21/2015  . Tachycardia [R00.0] 03/21/2015  . Palpitations [R00.2] 03/21/2015  . Chest pain [R07.9] 03/21/2015  . MDD (major depressive disorder), recurrent episode, moderate ([place]) [F33.1] 03/16/2015  . Consultation for female sterilization [Z79.15] 05/09/2013  . NSVD (normal spontaneous vaginal  delivery) [O80] 03/30/2013  . Chronic hypertension in pregnancy [O10.919] 03/29/2013  . HTN in pregnancy, chronic [O10.919] 03/28/2013  . Transient hypertension of pregnancy, antepartum [O13.9] 03/20/2013  . GERD (gastroesophageal reflux disease) [K21.9] 01/18/2013  . Hyperemesis gravidarum before end of [redacted] week gestation with electrolyte imbalance [O21.1] 09/23/2012  . Trichomonas [A59.9] 09/23/2012  . Supervision of high-risk pregnancy [O09.90] 04/19/2011  . History of molar pregnancy, antepartum [O09.A0] 03/22/2011  . Hyperthyroidism, antepartum [A56.979, E07.9] 03/16/2011  . Sickle cell trait ([place]) [D57.3] 12/30/2010   Total Time spent with patient: 25 minutes     Past Medical History:  Past Medical History  Diagnosis Date  . Sickle cell trait ([place])   . Hyperthyroidism   . Sickle cell trait ([place])   . Cholecystitis 08/18/2011  . SVD (spontaneous vaginal delivery)     x 3 WH  . Hypertension   . History of recurrent UTIs     recurrent UTIs  . Headache(784.0)     otc med prn  . Joint pain     knees and shoulders - otc med prn    Past Surgical History  Procedure Laterality Date  . Cholecystectomy  08/19/2011    Procedure: LAPAROSCOPIC CHOLECYSTECTOMY WITH INTRAOPERATIVE CHOLANGIOGRAM;  Surgeon: Madilyn Hook, DO;  Location: [place];  Service: General;  Laterality: N/A;  . Dilation and curettage of uterus      molar preg  . Laparoscopic bilateral salpingectomy Bilateral 05/09/2013    Procedure: LAPAROSCOPIC BILATERAL SALPINGECTOMY;  Surgeon: Emily Filbert, MD;  Location: [facility];  Service: Gynecology;  Laterality: Bilateral;   Family History:  Family  History  Problem Relation Age of Onset  . Diabetes Mother   . Cancer Father   . Diabetes Maternal Uncle   . Cancer Maternal Grandmother   . Cancer Maternal Grandfather   . Hypertension Maternal Grandfather   . Diabetes Maternal Grandfather   . Sickle cell anemia Son   . Asthma Son   . Heart disease Paternal Grandfather   .  Hearing loss Paternal Aunt     Social History:  History  Alcohol Use No     History  Drug Use No    Social History   Social History  . Marital Status: Single    Spouse Name: N/A  . Number of Children: N/A  . Years of Education: N/A   Social History Main Topics  . Smoking status: Never Smoker   . Smokeless tobacco: Never Used  . Alcohol Use: No  . Drug Use: No  . Sexual Activity: Yes    Birth Control/ Protection: Surgical   Other Topics Concern  . None   Social History Narrative   2 Adults, 4 children at home   FOB involved   Additional Social History:   Sleep: improved   Appetite:  Fair  Current Medications: Current Facility-Administered Medications  Medication Dose Route Frequency Provider Last Rate Last Dose  . buPROPion (WELLBUTRIN XL) 24 hr tablet 300 mg  300 mg Oral Daily Delfin Gant, NP   300 mg at 04/18/15 0825  . citalopram (CELEXA) tablet 40 mg  40 mg Oral Daily Delfin Gant, NP   40 mg at 04/18/15 0825  . feeding supplement (ENSURE ENLIVE) (ENSURE ENLIVE) liquid 237 mL  237 mL Oral BID BM Myer Peer Tomesha Sargent, MD   237 mL at 04/17/15 1507  . hydrOXYzine (ATARAX/VISTARIL) tablet 25 mg  25 mg Oral Q6H PRN Delfin Gant, NP   25 mg at 04/18/15 1252  . ibuprofen (ADVIL,MOTRIN) tablet 400 mg  400 mg Oral Q6H PRN Delfin Gant, NP   400 mg at 04/18/15 1252  . methimazole (TAPAZOLE) tablet 20 mg  20 mg Oral BID Delfin Gant, NP   20 mg at 04/18/15 0824  . metoprolol tartrate (LOPRESSOR) tablet 12.5 mg  12.5 mg Oral BID Delfin Gant, NP   12.5 mg at 04/18/15 0824  . ondansetron (ZOFRAN) tablet 4 mg  4 mg Oral Q6H PRN Encarnacion Slates, NP   4 mg at 04/17/15 2038  . pantoprazole (PROTONIX) EC tablet 40 mg  40 mg Oral Daily Delfin Gant, NP   40 mg at 04/18/15 0825  . QUEtiapine (SEROQUEL) tablet 150 mg  150 mg Oral QHS Jenne Campus, MD   150 mg at 04/17/15 2108    Lab Results:  Results for orders placed or performed during  the hospital encounter of 04/15/15 (from the past 48 hour(s))  Urinalysis, Routine w reflex microscopic (not at [facility])     Status: Abnormal   Collection Time: 04/17/15  5:56 AM  Result Value Ref Range   Color, Urine YELLOW YELLOW   APPearance TURBID (A) CLEAR   Specific Gravity, Urine 1.012 1.005 - 1.030   pH 6.0 5.0 - 8.0   Glucose, UA NEGATIVE NEGATIVE mg/dL   Hgb urine dipstick TRACE (A) NEGATIVE   Bilirubin Urine NEGATIVE NEGATIVE   Ketones, ur NEGATIVE NEGATIVE mg/dL   Protein, ur NEGATIVE NEGATIVE mg/dL   Nitrite POSITIVE (A) NEGATIVE   Leukocytes, UA LARGE (A) NEGATIVE    Comment: Performed at [facility]  Urine culture     Status: None (Preliminary result)   Collection Time: 04/17/15  5:56 AM  Result Value Ref Range   Specimen Description      URINE, CLEAN CATCH Performed at [place] Requests      Normal Performed at [facility]    Culture      >=100,000 COLONIES/mL ESCHERICHIA COLI Performed at [facility]    Report Status PENDING   Urine microscopic-add on     Status: Abnormal   Collection Time: 04/17/15  5:56 AM  Result Value Ref Range   Squamous Epithelial / LPF 0-5 (A) NONE SEEN   WBC, UA TOO NUMEROUS TO COUNT 0 - 5 WBC/hpf   RBC / HPF 0-5 0 - 5 RBC/hpf   Bacteria, UA MANY (A) NONE SEEN    Comment: Performed at [facility]  Lipid panel     Status: Abnormal   Collection Time: 04/17/15  6:30 AM  Result Value Ref Range   Cholesterol 112 0 - 200 mg/dL   Triglycerides 110 <150 mg/dL   HDL 31 (L) >40 mg/dL   Total CHOL/HDL Ratio 3.6 RATIO   VLDL 22 0 - 40 mg/dL   LDL Cholesterol 59 0 - 99 mg/dL    Comment:        Total Cholesterol/HDL:CHD Risk Coronary Heart Disease Risk Table                     Men   Women  1/2 Average Risk   3.4   3.3  Average Risk       5.0   4.4  2 X Average Risk   9.6   7.1  3 X Average Risk  23.4   11.0        Use the calculated  Patient Ratio above and the CHD Risk Table to determine the patient's CHD Risk.        ATP III CLASSIFICATION (LDL):  <100     mg/dL   Optimal  100-129  mg/dL   Near or Above                    Optimal  130-159  mg/dL   Borderline  160-189  mg/dL   High  >190     mg/dL   Very High Performed at [facility]   Hemoglobin A1c     Status: None   Collection Time: 04/17/15  6:45 AM  Result Value Ref Range   Hgb A1c MFr Bld 5.4 4.8 - 5.6 %    Comment: (NOTE)         Pre-diabetes: 5.7 - 6.4         Diabetes: >6.4         Glycemic control for adults with diabetes: <7.0    Mean Plasma Glucose 108 mg/dL    Comment: (NOTE) Performed At: [facility] [place], [place] [id number] Lindon Romp MD BH:[id number] Performed at [facility]     Physical Findings: AIMS: Facial and Oral Movements Muscles of Facial Expression: None, normal Lips and Perioral Area: None, normal Jaw: None, normal Tongue: None, normal,Extremity Movements Upper (arms, wrists, hands, fingers): None, normal Lower (legs, knees, ankles, toes): None, normal, Trunk Movements Neck, shoulders, hips: None, normal, Overall Severity Severity of abnormal movements (highest score from questions above): None, normal Incapacitation due to abnormal movements: None,  normal Patient's awareness of abnormal movements (rate only patient's report): No Awareness, Dental Status Current problems with teeth and/or dentures?: No Does patient usually wear dentures?: No  CIWA:  CIWA-Ar Total: 1 COWS:  COWS Total Score: 2  Musculoskeletal: Strength & Muscle Tone: within normal limits Gait & Station: normal Patient leans: N/A  Psychiatric Specialty Exam: ROS no fever, no chills, no urgency, slight  dysuria, no flank,  No pelvic pain, (+) nausea,no vomiting   Blood pressure 112/78, pulse 98, temperature 98.4 F (36.9 C), temperature source Oral, resp. rate 16, height 5' 4.25" (1.632 m),  weight 148 lb (67.132 kg), last menstrual period 03/20/2015, SpO2 100 %.Body mass index is 25.21 kg/(m^2).  General Appearance: improved grooming   Eye Contact::   Improved   Speech:  Normal Rate  Volume:  Normal  Mood:  Still depressed, but presents with some improvement compared to admission  Affect:  Constricted, but does smile appropriately at times   Thought Process:  Linear  Orientation:  Other:  fully alert and attentive   Thought Content:  denies hallucinations,no delusions expressed, ruminative about stressors   Suicidal Thoughts:   Describes some passive thoughts of death, but at this time denies plan or intention of hurting self, denies SI, also denies any plan or intention of hurting anyone .  Homicidal Thoughts:  No  Memory:  recent and remote grossly intact   Judgement:  Fair  Insight:  Fair  Psychomotor Activity:  Normal  Concentration:  Good  Recall:  Good  Fund of Knowledge:Good  Language: Good  Akathisia:  Negative  Handed:  Right  AIMS (if indicated):     Assets:  Desire for Improvement Resilience  ADL's:  Intact  Cognition: WNL  Sleep:  Number of Hours: 6.75  Assessment - patient reports ongoing depression , passive thoughts of death, but denies any plan or intention of hurting self and contracts for safety on the unit . Reports nausea/ fair appetite, but no vomiting, no diarrhea, no abdominal pain.  States she feels medications are helping and wants to continue current medication regimen .  UA consistent with UTI- patient had been asymptomatic so recommendation was to treat as asymptomatic bacteriuria and not start antibiotics- however, today does report some mild dysuria- will start Macrodantin Treatment Plan Summary: Daily contact with patient to assess and evaluate symptoms and progress in treatment, Medication management, Plan inpatietn admission and medications as below  Encourage group participation to work on coping skills and symptom reduction Continue  Celexa 40 mgrs QDAY for depression and anxiety Continue Wellbutrin XL 300 mgrs QDAY for depression Continue Seroquel  150 mgrs QHS to address insomnia and night time anxiety, ruminations Continue Hydroxyzine 25 mgrs  Q 6 hours PRN  for management of anxiety as needed  Continue Tapazole / B blocker for management of Hyperthyroidism  Start Macrodantin 50 mgrs Q 6 hours x 7 days for UTI. Zofran PRNs for nausea, as needed . Reata Petrov, [place] 04/18/2015, 4:55 PM

## 2015-04-18 NOTE — Progress Notes (Signed)
Adult Psychoeducational Group Note  Date:  04/18/2015 Time:  9:24 PM  Group Topic/Focus:  Wrap-Up Group:   The focus of this group is to help patients review their daily goal of treatment and discuss progress on daily workbooks.  Participation Level:  Active  Participation Quality:  Appropriate  Affect:  Appropriate  Cognitive:  Alert  Insight: Appropriate  Engagement in Group:  Engaged  Modes of Intervention:  Discussion  Additional Comments:  Patient stated having an okay day. Patient stated she has been sick to her stomach all day, but received medicine from her nurse and it helped a little.  Judithe Keetch L Luca Dyar 04/18/2015, 9:24 PM

## 2015-04-18 NOTE — Progress Notes (Signed)
D: Dafna continues to complain of nausea and "not feeling well". Patient was started on Macrodantin 50 mg every 6 hrs. Patient was made aware of her infection which might probably be the cause of her "not feeling too good". She was encouraged to take the treatment and see how it helps. Denies pain, SI, AH/VH at this time. No behavioral issues noted.  A: Support and encouragement offered as needed. Due/PRN meds given as ordered. Every 15 minutes check for safety maintained. Will continue to monitor patient for safety and stability.  R: Patient receptive to education and nursing interventions.

## 2015-04-18 NOTE — [facility] Group Notes (Signed)
[facility]   04/18/2015 9:57 AM  Participation Quality:  Patient did not attend group and declined invitation.     PICKETT JR, Fanchon Papania C

## 2015-04-18 NOTE — [facility] Group Notes (Signed)
[facility] LCSW Group Therapy  04/18/2015 2:32 PM  Type of Therapy: Group Therapy- Feelings Around Relapse and Recovery  Participation Level: Active   Participation Quality: Appropriate  Affect: Appropriate  Cognitive: Alert and Oriented   Insight: Developing   Engagement in Therapy: Developing/Improving and Engaged   Modes of Intervention: Clarification, Confrontation, Discussion, Education, Exploration, Limit-setting, Orientation, Problem-solving, Rapport Building, Dance movement psychotherapisteality Testing, Socialization and Support  Summary of Progress/Problems: The topic for today was feelings about relapse. The group discussed what relapse prevention is to them and identified triggers that they are on the path to relapse. Members also processed their feeling towards relapse and were able to relate to common experiences. Group also discussed coping skills that can be used for relapse prevention.   Patient was observed to be attentive in group but did not provide any therapeutic contribution to group.  PICKETT JR, Mylei Brackeen C 04/18/2015, 2:32 PM

## 2015-04-18 NOTE — Progress Notes (Signed)
D: Patient seen on dayroom watching TV. Complained of nausea. Patient stated "It's been like that all day. I can get something for that and for sleep too. Just have to go to bed" denies pain, SI/AH/VH at this time. No behavioral issues noted. A: Offered support and encouragement as needed. Due/PRN medications given as ordered. Every 15 minutes check for safety maintained. Will continue to monitor patient for safety and stability. R: Patient receptive to nursing interventions.

## 2015-04-19 DIAGNOSIS — F332 Major depressive disorder, recurrent severe without psychotic features: Principal | ICD-10-CM

## 2015-04-19 LAB — URINE CULTURE: SPECIAL REQUESTS: NORMAL

## 2015-04-19 MED ORDER — BENZTROPINE MESYLATE 1 MG PO TABS
1.0000 mg | ORAL_TABLET | Freq: Once | ORAL | Status: AC
  Administered 2015-04-19: 1 mg via ORAL
  Filled 2015-04-19 (×2): qty 1

## 2015-04-19 MED ORDER — METHOCARBAMOL 500 MG PO TABS
500.0000 mg | ORAL_TABLET | Freq: Two times a day (BID) | ORAL | Status: DC
  Administered 2015-04-19 – 2015-04-25 (×13): 500 mg via ORAL
  Filled 2015-04-19 (×17): qty 1

## 2015-04-19 MED ORDER — IBUPROFEN 600 MG PO TABS
600.0000 mg | ORAL_TABLET | Freq: Four times a day (QID) | ORAL | Status: DC | PRN
  Administered 2015-04-19 – 2015-04-25 (×3): 600 mg via ORAL
  Filled 2015-04-19 (×3): qty 1

## 2015-04-19 NOTE — [facility] Group Notes (Signed)
[facility] Group Notes:  (Nursing/MHT/Case Management/Adjunct)  Date:  04/19/2015  Time:  2:30 PM  Type of Therapy:  Psychoeducational Skills  Participation Level:  Active  Participation Quality:  Appropriate  Affect:  Appropriate  Cognitive:  Appropriate  Insight:  Appropriate  Engagement in Group:  Engaged  Modes of Intervention:  Discussion  Summary of Progress/Problems: Pt did attend self inventory group.    Ashwath Lasch Shanta 04/19/2015, 2:30 PM 

## 2015-04-19 NOTE — Progress Notes (Signed)
D Ericca  c'o'd of neck pain this morning and when it got worse ( heat packs had no affect) , she was given a one time dose of cogentin and robaxin and she stated this  Had " no " effect. She remaisn sad, depressed and somatically  Focused.  A She did compelted her daily assessment and on it she wrote she  She denied SI and she rated her depression, hopelessness and anxiety  " 11/22/08", respectivlye.    R Safety n place.

## 2015-04-19 NOTE — [facility] Group Notes (Signed)
Adult Psychoeducational Group Note  Date:  04/19/2015 Time:  9:55 PM  Group Topic/Focus:  Wrap-Up Group:   The focus of this group is to help patients review their daily goal of treatment and discuss progress on daily workbooks.  Participation Level:  Minimal  Participation Quality:  Appropriate  Affect:  Flat  Cognitive:  Appropriate  Insight: Good  Engagement in Group:  Limited  Modes of Intervention:  Discussion  Additional Comments:  Patient stated her day was ok but painful due to a muscle spasm in her neck.  Her goal was to attend all groups.  She stated she missed one group today.  Her goal for the new year is to "get stable and get my kids back".  Caroll RancherLindsay, Kiernan Atkerson A 04/19/2015, 9:55 PM

## 2015-04-19 NOTE — Progress Notes (Signed)
Patient ID: Grace [name], female   DOB: [birth year], 29 y.o.   MRN: [id number] Patient ID: Grace [name], female   DOB: 11-04-1985, 29 y.o.   MRN: [id number] [facility] MD Progress Note  04/19/2015 4:16 PM Grace [name]  MRN:  [id number]  Subjective: Jann reports, "I'm in pain, I think it is muscle spasms to my neck area. I have had it before. I was given pain pills & muscle spasms. The pain today is worse. I don't know what the trigger is. I'm still feeling SIHI. Still depressed. I'm going through some legal stuff. My depression is at #8 & anxiety at #10".  Objective : I have discussed case with treatment team and have met with patient. Patient more visible on unit, going to some groups, but tends to be reserved, and states she tends to " keep to myself ", particularly when she does not know people well. Patient presents somewhat improved compared to admission- she is better groomed, has better eye contact, and although continues to report feeling depressed, presents with a fuller range of affect and smiles appropriately at times . States she has ongoing SIHI. Denies plan or intention of hurting self or others , contracts for safety. No disruptive or agitated behaviors on unit. Was medicated with Benztropine 0.5 mg once for EPS. Currently on Robaxin 500 mg bid & Ibuprofen 600 mg prn for pain management.  Principal Problem: MDD (major depressive disorder), recurrent episode, severe ([place]) Diagnosis:   Patient Active Problem List   Diagnosis Date Noted  . MDD (major depressive disorder), recurrent episode, severe ([place]) [F33.2] 04/15/2015  . Hyperthyroidism [E05.90] 03/21/2015  . Tachycardia [R00.0] 03/21/2015  . Palpitations [R00.2] 03/21/2015  . Chest pain [R07.9] 03/21/2015  . MDD (major depressive disorder), recurrent episode, moderate () [F33.1] 03/16/2015  . Consultation for female sterilization [Z76.73] 05/09/2013  . NSVD (normal spontaneous vaginal delivery) [O80] 03/30/2013   . Chronic hypertension in pregnancy [O10.919] 03/29/2013  . HTN in pregnancy, chronic [O10.919] 03/28/2013  . Transient hypertension of pregnancy, antepartum [O13.9] 03/20/2013  . GERD (gastroesophageal reflux disease) [K21.9] 01/18/2013  . Hyperemesis gravidarum before end of [redacted] week gestation with electrolyte imbalance [O21.1] 09/23/2012  . Trichomonas [A59.9] 09/23/2012  . Supervision of high-risk pregnancy [O09.90] 04/19/2011  . History of molar pregnancy, antepartum [O09.A0] 03/22/2011  . Hyperthyroidism, antepartum [A19.379, E07.9] 03/16/2011  . Sickle cell trait ([place]) [D57.3] 12/30/2010   Total Time spent with patient: 15 minutes  Past Medical History:  Past Medical History  Diagnosis Date  . Sickle cell trait ([place])   . Hyperthyroidism   . Sickle cell trait ([place])   . Cholecystitis 08/18/2011  . SVD (spontaneous vaginal delivery)     x 3 WH  . Hypertension   . History of recurrent UTIs     recurrent UTIs  . Headache(784.0)     otc med prn  . Joint pain     knees and shoulders - otc med prn    Past Surgical History  Procedure Laterality Date  . Cholecystectomy  08/19/2011    Procedure: LAPAROSCOPIC CHOLECYSTECTOMY WITH INTRAOPERATIVE CHOLANGIOGRAM;  Surgeon: Madilyn Hook, DO;  Location: [facility];  Service: General;  Laterality: N/A;  . Dilation and curettage of uterus      molar preg  . Laparoscopic bilateral salpingectomy Bilateral 05/09/2013    Procedure: LAPAROSCOPIC BILATERAL SALPINGECTOMY;  Surgeon: Emily Filbert, MD;  Location: [facility];  Service: Gynecology;  Laterality: Bilateral;   Family History:  Family History  Problem Relation  Age of Onset  . Diabetes Mother   . Cancer Father   . Diabetes Maternal Uncle   . Cancer Maternal Grandmother   . Cancer Maternal Grandfather   . Hypertension Maternal Grandfather   . Diabetes Maternal Grandfather   . Sickle cell anemia Son   . Asthma Son   . Heart disease Paternal Grandfather   . Hearing loss Paternal Aunt     Social History:  History  Alcohol Use No     History  Drug Use No    Social History   Social History  . Marital Status: Single    Spouse Name: N/A  . Number of Children: N/A  . Years of Education: N/A   Social History Main Topics  . Smoking status: Never Smoker   . Smokeless tobacco: Never Used  . Alcohol Use: No  . Drug Use: No  . Sexual Activity: Yes    Birth Control/ Protection: Surgical   Other Topics Concern  . None   Social History Narrative   2 Adults, 4 children at home   FOB involved   Additional Social History:   Sleep: improved   Appetite:  Fair  Current Medications: Current Facility-Administered Medications  Medication Dose Route Frequency Provider Last Rate Last Dose  . buPROPion (WELLBUTRIN XL) 24 hr tablet 300 mg  300 mg Oral Daily Delfin Gant, NP   300 mg at 04/19/15 1638  . citalopram (CELEXA) tablet 40 mg  40 mg Oral Daily Delfin Gant, NP   40 mg at 04/19/15 0810  . feeding supplement (ENSURE ENLIVE) (ENSURE ENLIVE) liquid 237 mL  237 mL Oral BID BM [name], MD   237 mL at 04/19/15 1500  . hydrOXYzine (ATARAX/VISTARIL) tablet 25 mg  25 mg Oral Q6H PRN Delfin Gant, NP   25 mg at 04/19/15 1610  . ibuprofen (ADVIL,MOTRIN) tablet 600 mg  600 mg Oral Q6H PRN Encarnacion Slates, NP   600 mg at 04/19/15 1520  . methimazole (TAPAZOLE) tablet 20 mg  20 mg Oral BID Delfin Gant, NP   20 mg at 04/19/15 4665  . methocarbamol (ROBAXIN) tablet 500 mg  500 mg Oral BID Encarnacion Slates, NP   500 mg at 04/19/15 1610  . metoprolol tartrate (LOPRESSOR) tablet 12.5 mg  12.5 mg Oral BID Delfin Gant, NP   12.5 mg at 04/19/15 0810  . nitrofurantoin (MACRODANTIN) capsule 50 mg  50 mg Oral 4 times per day Jenne Campus, MD   50 mg at 04/19/15 1610  . ondansetron (ZOFRAN) tablet 4 mg  4 mg Oral Q6H PRN Encarnacion Slates, NP   4 mg at 04/18/15 1810  . pantoprazole (PROTONIX) EC tablet 40 mg  40 mg Oral Daily Delfin Gant, NP   40 mg  at 04/19/15 9935  . QUEtiapine (SEROQUEL) tablet 150 mg  150 mg Oral QHS Jenne Campus, MD   150 mg at 04/18/15 2121    Lab Results:  Results for orders placed or performed during the hospital encounter of 04/15/15 (from the past 48 hour(s))  Pregnancy, urine     Status: None   Collection Time: 04/18/15  6:12 PM  Result Value Ref Range   Preg Test, Ur NEGATIVE NEGATIVE    Comment:        THE SENSITIVITY OF THIS METHODOLOGY IS >20 mIU/mL. Performed at [facility]    Physical Findings: AIMS: Facial and Oral Movements Muscles of Facial  Expression: None, normal Lips and Perioral Area: None, normal Jaw: None, normal Tongue: None, normal,Extremity Movements Upper (arms, wrists, hands, fingers): None, normal Lower (legs, knees, ankles, toes): None, normal, Trunk Movements Neck, shoulders, hips: None, normal, Overall Severity Severity of abnormal movements (highest score from questions above): None, normal Incapacitation due to abnormal movements: None, normal Patient's awareness of abnormal movements (rate only patient's report): No Awareness, Dental Status Current problems with teeth and/or dentures?: No Does patient usually wear dentures?: No  CIWA:  CIWA-Ar Total: 1 COWS:  COWS Total Score: 2  Musculoskeletal: Strength & Muscle Tone: within normal limits Gait & Station: normal Patient leans: N/A  Psychiatric Specialty Exam: Review of Systems  Constitutional: Negative.   Eyes: Negative.   Respiratory: Negative.   Cardiovascular: Negative.   Gastrointestinal: Negative.   Genitourinary: Negative.   Musculoskeletal: Negative.   Skin: Negative.   Neurological: Negative.   Endo/Heme/Allergies: Negative.   Psychiatric/Behavioral: Positive for depression and suicidal ideas. Negative for hallucinations, memory loss and substance abuse. The patient is nervous/anxious. The patient does not have insomnia.    no fever, no chills, no urgency, slight  dysuria, no  flank,  No pelvic pain, (+) nausea,no vomiting   Blood pressure 107/70, pulse 79, temperature 98.2 F (36.8 C), temperature source Oral, resp. rate 16, height 5' 4.25" (1.632 m), weight 67.132 kg (148 lb), last menstrual period 03/20/2015, SpO2 100 %.Body mass index is 25.21 kg/(m^2).  General Appearance: improved grooming   Eye Contact::   Improved   Speech:  Normal Rate  Volume:  Normal  Mood:  Still depressed, but presents with some improvement compared to admission  Affect:  Constricted, but does smile appropriately at times   Thought Process:  Linear  Orientation:  Other:  fully alert and attentive   Thought Content:  denies hallucinations,no delusions expressed, ruminative about stressors   Suicidal Thoughts:   Describes some passive thoughts of death, but at this time denies plan or intention of hurting self, denies SI, also denies any plan or intention of hurting anyone .  Homicidal Thoughts:  No  Memory:  recent and remote grossly intact   Judgement:  Fair  Insight:  Fair  Psychomotor Activity:  Normal  Concentration:  Good  Recall:  Good  Fund of Knowledge:Good  Language: Good  Akathisia:  Negative  Handed:  Right  AIMS (if indicated):     Assets:  Desire for Improvement Resilience  ADL's:  Intact  Cognition: WNL  Sleep:  Number of Hours: 6.25   Assessment - patient reports ongoing depression , passive thoughts of death, but denies any plan or intention of hurting self and contracts for safety on the unit . Reports nausea/ fair appetite, but no vomiting, no diarrhea, no abdominal pain. States she feels medications are helping and wants to continue current medication regimen. UA consistent with UTI- patient had been asymptomatic so recommendation was to treat as asymptomatic bacteriuria and not start antibiotics- however, today does report some mild dysuria- will start Macrodantin  Treatment Plan Summary: Daily contact with patient to assess and evaluate symptoms and  progress in treatment, Medication management, Plan inpatietn admission and medications as below  1. Encourage group participation to work on coping skills and symptom reduction 2.Continue Celexa 40 mgrs QDAY for depression and anxiety 3.Continue Wellbutrin XL 300 mgrs QDAY for depression 4.Continue Seroquel  150 mgrs QHS to address insomnia and night time anxiety, ruminations 5.Continue Hydroxyzine 25 mgrs  Q 6 hours PRN  for management of  anxiety as needed  6.Continue Tapazole / B blocker for management of Hyperthyroidism  7. Continue Macrodantin 50 mgrs Q 6 hours x 7 days for UTI. 8.Zofran PRNs for nausea, as needed. 9. Administer Cogentin 0.5 mg once for EPS, Robaxin 500 mg bid prn for muscle spasm, Ibuprofen 600 mg Q 6 hours prn for pain  Encarnacion Slates, PMHNP, FNP 04/19/2015, 4:16 PM I agree with assessment and plan Woodroe Chen. Sabra Heck, M.D.

## 2015-04-19 NOTE — [facility] Group Notes (Signed)
[facility] Group Notes: (Clinical Social Work)   04/19/2015      Type of Therapy:  Group Therapy   Participation Level:  Did Not Attend despite MHT prompting   Deyja Sochacki Grossman-Orr, LCSW 04/19/2015, 12:21 PM     

## 2015-04-20 DIAGNOSIS — F333 Major depressive disorder, recurrent, severe with psychotic symptoms: Secondary | ICD-10-CM | POA: Insufficient documentation

## 2015-04-20 MED ORDER — QUETIAPINE FUMARATE 200 MG PO TABS
200.0000 mg | ORAL_TABLET | Freq: Every day | ORAL | Status: DC
  Administered 2015-04-20 – 2015-04-24 (×5): 200 mg via ORAL
  Filled 2015-04-20 (×5): qty 1
  Filled 2015-04-20: qty 7
  Filled 2015-04-20 (×2): qty 1

## 2015-04-20 MED ORDER — QUETIAPINE FUMARATE 25 MG PO TABS
25.0000 mg | ORAL_TABLET | Freq: Three times a day (TID) | ORAL | Status: DC
  Administered 2015-04-20 – 2015-04-25 (×15): 25 mg via ORAL
  Filled 2015-04-20 (×3): qty 1
  Filled 2015-04-20: qty 21
  Filled 2015-04-20 (×11): qty 1
  Filled 2015-04-20: qty 21
  Filled 2015-04-20 (×3): qty 1
  Filled 2015-04-20: qty 21
  Filled 2015-04-20: qty 1

## 2015-04-20 MED ORDER — HYDROXYZINE HCL 50 MG PO TABS
50.0000 mg | ORAL_TABLET | Freq: Four times a day (QID) | ORAL | Status: DC | PRN
  Administered 2015-04-20 – 2015-04-21 (×2): 50 mg via ORAL
  Filled 2015-04-20 (×2): qty 1

## 2015-04-20 NOTE — Progress Notes (Signed)
D Pt.  Denies SI and HI, no complaints of pain or discomfort noted at present time.  A Writer offered support and encouragement, discussed coping skills with pt.  R Pt. Remains safe on the unit,   Rates her day an 8 in the end but states it started out a 3, her depression a 7 and her anxiety a 10, stating it is always high.  Pt. Admits she  stresses about the unknown and has negative thoughts. Pt. Reports her coping skills are music and walking.

## 2015-04-20 NOTE — Plan of Care (Signed)
Problem: Diagnosis: Increased Risk For Suicide Attempt Goal: STG-Patient Will Report Suicidal Feelings to Staff Outcome: Progressing Patient reports passive suicidal ideations and verbally contracts for safety. She reports that she will seek staff if thoughts become intense and feels the need to act upon her thoughts.

## 2015-04-20 NOTE — Progress Notes (Signed)
Writer spoke with patient 1:1 and she reports that her goal is not to isolate in her room so much. She was observed up in the dayroom watching tv and interacting with select peers. She reports passive si and verbally contracts for safety. Support givne and safety maintained on unit with 15 min checks.

## 2015-04-20 NOTE — [facility] Group Notes (Signed)
[facility] Group Notes:  (Clinical Social Work)   04/20/2015    1:15-2:15PM  Summary of Progress/Problems:   The main focus of today's process group was to   1)  Discuss any resolutions or goals for the new year  2)  Identify current unhealthy supports and discuss how to set limits with them  3)  Discuss the importance of adding supports  An emphasis was placed on using counselor, doctor, therapy groups, and problem-specific support groups to expand supports.    The patient expressed full comprehension of the concepts presented, and agreed that there is a need to add more supports.  The patient stated that in the coming year she wants to work on handling her anger better.  She stated her mother is not a good support person for her, and that makes her quite angry.  She agreed with a lot that other patients were saying, and was attentive.  Type of Therapy:  Process Group with Motivational Interviewing  Participation Level:  Active  Participation Quality:  Attentive  Affect:  Blunted and Depressed  Cognitive:  Oriented  Insight:  Engaged  Engagement in Therapy:  Engaged  Modes of Intervention:   Education, Support and Processing, Activity  Ambrose MantleMareida Grossman-Orr, LCSW 04/20/2015    3:19 PM

## 2015-04-20 NOTE — Plan of Care (Signed)
Problem: Alteration in mood; excessive anxiety as evidenced by: Goal: LTG-Patient's behavior demonstrates decreased anxiety (Patient's behavior demonstrates anxiety and he/she is utilizing learned coping skills to deal with anxiety-producing situations)  Outcome: Progressing Nurse discussed anxiety/coping skills with patient.

## 2015-04-20 NOTE — [facility] Group Notes (Signed)

## 2015-04-20 NOTE — Progress Notes (Signed)
D:  Patient's self inventory sheet, patient has fair sleep, sleep medication is helpful.  Poor appetite, low energy level, poor concentration.  Rated depression 8, hopeless 5, anxiety 10.  Denied withdrawals.  SI, contracts for safety.  Has experienced headaches and pain in past 24 hours.  Muscle spasms.  Pain medication is not helpful.  Goal is not isolating.  Plans to attend groups.  No discharge plans. A:  Medications administered per MD orders.  Emotional support and encouragement given patient. R:  SI and HI, contracts for safety.  Shadows call her name and tell her things.  Safety maintained with 15 minute checks.

## 2015-04-20 NOTE — Progress Notes (Signed)
Patient ID: Grace [name], female   DOB: 05/22/1985, 30 y.o.   MRN: [id number] Patient ID: Grace [name], female   DOB: 03-30-1986, 30 y.o.   MRN: [id number] Patient ID: Grace [name], female   DOB: 1985-06-19, 30 y.o.   MRN: [id number] [facility] MD Progress Note  04/20/2015 3:50 PM [place]  MRN:  [id number]  Subjective: Grace [name] reports, "I'm having bad racing thoughts at night & during the day. I feel agitated. It has been weeks since I have been feeling this way. I have bad anxiety. Vistaril 25 mg is not helping me. I'm still feeling the SIHI, no plans to hurt me or anyone here. The HI is towards my baby daddy. I have 3 felony charges because of him. My court date is on 05-13-15. It makes mad to think about all these felony charges & what he is putting me through"  Objective : I have discussed case with treatment team and have met with patient. Patient more visible on unit, going to some groups, but tends to be reserved, and states she tends to " keep to myself ", particularly when she does not know people well. Patient presents somewhat improved compared to admission- she is better groomed, has better eye contact, and although continues to report feeling depressed & SIHI without plans. She presents with a fuller range of affect and smiles appropriately at times.  She  contracts for safety. No disruptive or agitated behaviors on the unit. Was medicated with Benztropine 0.5 mg once for EPS . Currently on Robaxin 500 mg bid & Ibuprofen 600 mg prn for pain management. Increased Seroquel night dose to 200 mg for racing thoughts, Hydroxyzine to 50 mg prn for anxiety & adds Seroquel 25 mg tid for agitation during the day.  Principal Problem: MDD (major depressive disorder), recurrent episode, severe ([place]) Diagnosis:   Patient Active Problem List   Diagnosis Date Noted  . MDD (major depressive disorder), recurrent episode, severe ([place]) [F33.2] 04/15/2015  . Hyperthyroidism [E05.90]  03/21/2015  . Tachycardia [R00.0] 03/21/2015  . Palpitations [R00.2] 03/21/2015  . Chest pain [R07.9] 03/21/2015  . MDD (major depressive disorder), recurrent episode, moderate ([place]) [F33.1] 03/16/2015  . Consultation for female sterilization [A35.57] 05/09/2013  . NSVD (normal spontaneous vaginal delivery) [O80] 03/30/2013  . Chronic hypertension in pregnancy [O10.919] 03/29/2013  . HTN in pregnancy, chronic [O10.919] 03/28/2013  . Transient hypertension of pregnancy, antepartum [O13.9] 03/20/2013  . GERD (gastroesophageal reflux disease) [K21.9] 01/18/2013  . Hyperemesis gravidarum before end of [redacted] week gestation with electrolyte imbalance [O21.1] 09/23/2012  . Trichomonas [A59.9] 09/23/2012  . Supervision of high-risk pregnancy [O09.90] 04/19/2011  . History of molar pregnancy, antepartum [O09.A0] 03/22/2011  . Hyperthyroidism, antepartum [D22.025, E07.9] 03/16/2011  . Sickle cell trait ([place]) [D57.3] 12/30/2010   Total Time spent with patient: 15 minutes  Past Medical History:  Past Medical History  Diagnosis Date  . Sickle cell trait ([place])   . Hyperthyroidism   . Sickle cell trait ([place])   . Cholecystitis 08/18/2011  . SVD (spontaneous vaginal delivery)     x 3 WH  . Hypertension   . History of recurrent UTIs     recurrent UTIs  . Headache(784.0)     otc med prn  . Joint pain     knees and shoulders - otc med prn    Past Surgical History  Procedure Laterality Date  . Cholecystectomy  08/19/2011    Procedure: LAPAROSCOPIC CHOLECYSTECTOMY WITH INTRAOPERATIVE CHOLANGIOGRAM;  Surgeon: Aaron Edelman  Lilyan Punt, DO;  Location: [facility];  Service: General;  Laterality: N/A;  . Dilation and curettage of uterus      molar preg  . Laparoscopic bilateral salpingectomy Bilateral 05/09/2013    Procedure: LAPAROSCOPIC BILATERAL SALPINGECTOMY;  Surgeon: Emily Filbert, MD;  Location: [facility];  Service: Gynecology;  Laterality: Bilateral;   Family History:  Family History  Problem Relation Age of Onset   . Diabetes Mother   . Cancer Father   . Diabetes Maternal Uncle   . Cancer Maternal Grandmother   . Cancer Maternal Grandfather   . Hypertension Maternal Grandfather   . Diabetes Maternal Grandfather   . Sickle cell anemia Son   . Asthma Son   . Heart disease Paternal Grandfather   . Hearing loss Paternal Aunt    Social History:  History  Alcohol Use No     History  Drug Use No    Social History   Social History  . Marital Status: Single    Spouse Name: N/A  . Number of Children: N/A  . Years of Education: N/A   Social History Main Topics  . Smoking status: Never Smoker   . Smokeless tobacco: Never Used  . Alcohol Use: No  . Drug Use: No  . Sexual Activity: Yes    Birth Control/ Protection: Surgical   Other Topics Concern  . None   Social History Narrative   2 Adults, 4 children at home   FOB involved   Additional Social History:   Sleep: improved   Appetite:  Fair  Current Medications: Current Facility-Administered Medications  Medication Dose Route Frequency Provider Last Rate Last Dose  . buPROPion (WELLBUTRIN XL) 24 hr tablet 300 mg  300 mg Oral Daily Delfin Gant, NP   300 mg at 04/20/15 0825  . citalopram (CELEXA) tablet 40 mg  40 mg Oral Daily Delfin Gant, NP   40 mg at 04/20/15 0824  . feeding supplement (ENSURE ENLIVE) (ENSURE ENLIVE) liquid 237 mL  237 mL Oral BID BM [name], MD   237 mL at 04/20/15 1450  . hydrOXYzine (ATARAX/VISTARIL) tablet 25 mg  25 mg Oral Q6H PRN Delfin Gant, NP   25 mg at 04/20/15 1448  . ibuprofen (ADVIL,MOTRIN) tablet 600 mg  600 mg Oral Q6H PRN Encarnacion Slates, NP   600 mg at 04/19/15 1520  . methimazole (TAPAZOLE) tablet 20 mg  20 mg Oral BID Delfin Gant, NP   20 mg at 04/20/15 0824  . methocarbamol (ROBAXIN) tablet 500 mg  500 mg Oral BID Encarnacion Slates, NP   500 mg at 04/20/15 0825  . metoprolol tartrate (LOPRESSOR) tablet 12.5 mg  12.5 mg Oral BID Delfin Gant, NP   12.5 mg at  04/20/15 0905  . nitrofurantoin (MACRODANTIN) capsule 50 mg  50 mg Oral 4 times per day Jenne Campus, MD   50 mg at 04/20/15 1123  . ondansetron (ZOFRAN) tablet 4 mg  4 mg Oral Q6H PRN Encarnacion Slates, NP   4 mg at 04/18/15 1810  . pantoprazole (PROTONIX) EC tablet 40 mg  40 mg Oral Daily Delfin Gant, NP   40 mg at 04/20/15 0825  . QUEtiapine (SEROQUEL) tablet 150 mg  150 mg Oral QHS Jenne Campus, MD   150 mg at 04/19/15 2130    Lab Results:  Results for orders placed or performed during the hospital encounter of 04/15/15 (from the past 48 hour(s))  Pregnancy, urine     Status: None   Collection Time: 04/18/15  6:12 PM  Result Value Ref Range   Preg Test, Ur NEGATIVE NEGATIVE    Comment:        THE SENSITIVITY OF THIS METHODOLOGY IS >20 mIU/mL. Performed at [facility]    Physical Findings: AIMS: Facial and Oral Movements Muscles of Facial Expression: None, normal Lips and Perioral Area: None, normal Jaw: None, normal Tongue: None, normal,Extremity Movements Upper (arms, wrists, hands, fingers): None, normal Lower (legs, knees, ankles, toes): None, normal, Trunk Movements Neck, shoulders, hips: None, normal, Overall Severity Severity of abnormal movements (highest score from questions above): None, normal Incapacitation due to abnormal movements: None, normal Patient's awareness of abnormal movements (rate only patient's report): No Awareness, Dental Status Current problems with teeth and/or dentures?: No Does patient usually wear dentures?: No  CIWA:  CIWA-Ar Total: 1 COWS:  COWS Total Score: 2  Musculoskeletal: Strength & Muscle Tone: within normal limits Gait & Station: normal Patient leans: N/A  Psychiatric Specialty Exam: Review of Systems  Constitutional: Negative.   Eyes: Negative.   Respiratory: Negative.   Cardiovascular: Negative.   Gastrointestinal: Negative.   Genitourinary: Negative.   Musculoskeletal: Negative.   Skin:  Negative.   Neurological: Negative.   Endo/Heme/Allergies: Negative.   Psychiatric/Behavioral: Positive for depression and suicidal ideas. Negative for hallucinations, memory loss and substance abuse. The patient is nervous/anxious. The patient does not have insomnia.    no fever, no chills, no urgency, slight  dysuria, no flank,  No pelvic pain, (+) nausea,no vomiting   Blood pressure 112/69, pulse 87, temperature 98.2 F (36.8 C), temperature source Oral, resp. rate 20, height 5' 4.25" (1.632 m), weight 67.132 kg (148 lb), last menstrual period 03/20/2015, SpO2 100 %.Body mass index is 25.21 kg/(m^2).  General Appearance: improved grooming   Eye Contact::   Improved   Speech:  Normal Rate  Volume:  Normal  Mood:  Still depressed, but presents with some improvement compared to admission  Affect:  Constricted, but does smile appropriately at times   Thought Process:  Linear  Orientation:  Other:  fully alert and attentive   Thought Content:  denies hallucinations,no delusions expressed, ruminative about stressors   Suicidal Thoughts:   Describes some passive thoughts of death, but at this time denies plan or intention of hurting self, denies SI, also denies any plan or intention of hurting anyone .  Homicidal Thoughts:  No  Memory:  recent and remote grossly intact   Judgement:  Fair  Insight:  Fair  Psychomotor Activity:  Normal  Concentration:  Good  Recall:  Good  Fund of Knowledge:Good  Language: Good  Akathisia:  Negative  Handed:  Right  AIMS (if indicated):     Assets:  Desire for Improvement Resilience  ADL's:  Intact  Cognition: WNL  Sleep:  Number of Hours: 6.25   Assessment - Patient reports ongoing depression , passive thoughts of HI, but denies any plan or intention of hurting self or others, able to contract for safety on the unit . Reports appetite, but denies vomiting, diarrhea or abdominal pain. States she feels medications are helping and wants to continue current  medication regimen. Continue Macrodantin for UTI.  Treatment Plan Summary: Daily contact with patient to assess and evaluate symptoms and progress in treatment, Medication management, Plan inpatietn admission and medications as below  1. Encourage group participation to work on coping skills and symptom reduction 2.Continue Celexa 40 mgrs  QDAY for depression and anxiety 3.Continue Wellbutrin XL 300 mgrs QDAY for depression 4. Increased Seroquel to 200 mgrs QHS to address insomnia/racing thoughts, add Seroquel 25 mg tid for agitation/anxiety & ruminations 5.Increased Hydroxyzine to 50 mgrs  Q 6 hours PRN  for management of anxiety as needed  6.Continue Tapazole / B blocker for management of Hyperthyroidism  7. Continue Macrodantin 50 mgrs Q 6 hours x 7 days for UTI. 8.Zofran PRNs for nausea, as needed. 9. Continue Robaxin 500 mg bid prn for muscle spasm, Ibuprofen 600 mg Q 6 hours prn for pain  Encarnacion Slates, PMHNP, FNP 04/20/2015, 3:50 PM

## 2015-04-21 NOTE — Progress Notes (Signed)
[facility] MD Progress Note  04/21/2015  Grace [name]  MRN:  [id number]  Subjective: Anise reports, "I'm still upset throughout the day. I feel a little bit better but I just need to be move involved in the groups. I do have racing thoughts a lot."   Objective: Pt seen and chart reviewed. Pt is alert/oriented x4, calm, cooperative, and appropriate to situation. Pt denies homicidal ideation and does not appear to be responding to internal stimuli. Pt does report seeing shadows sometimes and does report suicidal thoughts without plan. She reports that she does not want to be taking medication but that she know sshe needs to at this time.  Principal Problem: MDD (major depressive disorder), recurrent episode, severe ([facility]) Diagnosis:   Patient Active Problem List   Diagnosis Date Noted  . 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 MDD (major depressive disorder), recurrent episode, severe ([facility]) [F33.2] 04/15/2015  . Hyperthyroidism [E05.90] 03/21/2015  . Tachycardia [R00.0] 03/21/2015  . Palpitations [R00.2] 03/21/2015  . Chest pain [R07.9] 03/21/2015  . MDD (major depressive disorder), recurrent episode, moderate ([facility]) [F33.1] 03/16/2015  . Consultation for female sterilization [Z30.09] 05/09/2013  . NSVD (normal spontaneous vaginal delivery) [O80] 03/30/2013  . Chronic hypertension in pregnancy [O10.919] 03/29/2013  . HTN in pregnancy, chronic [O10.919] 03/28/2013  . Transient hypertension of pregnancy, antepartum [O13.9] 03/20/2013  . GERD (gastroesophageal reflux disease) [K21.9] 01/18/2013  . Hyperemesis gravidarum before end of [redacted] week gestation with electrolyte imbalance [O21.1] 09/23/2012  . Trichomonas [A59.9] 09/23/2012  . Supervision of high-risk pregnancy [O09.90] 04/19/2011  . History of molar pregnancy, antepartum [O09.A0] 03/22/2011  . Hyperthyroidism, antepartum [W09.811, E07.9] 03/16/2011  . Sickle cell trait ([facility])  [D57.3] 12/30/2010   Total Time spent with patient: 15 minutes  Past Medical History:  Past Medical History  Diagnosis Date  . Sickle cell trait ([facility])   . Hyperthyroidism   . Sickle cell trait ([facility])   . Cholecystitis 08/18/2011  . SVD (spontaneous vaginal delivery)     x 3 WH  . Hypertension   . History of recurrent UTIs     recurrent UTIs  . Headache(784.0)     otc med prn  . Joint pain     knees and shoulders - otc med prn    Past Surgical History  Procedure Laterality Date  . Cholecystectomy  08/19/2011    Procedure: LAPAROSCOPIC CHOLECYSTECTOMY WITH INTRAOPERATIVE CHOLANGIOGRAM;  Surgeon: Lodema Pilot, DO;  Location: [facility];  Service: General;  Laterality: N/A;  . Dilation and curettage of uterus      molar preg  . Laparoscopic bilateral salpingectomy Bilateral 05/09/2013    Procedure: LAPAROSCOPIC BILATERAL SALPINGECTOMY;  Surgeon: Allie Bossier, MD;  Location: [facility];  Service: Gynecology;  Laterality: Bilateral;   Family History:  Family History  Problem Relation Age of Onset  . Diabetes Mother   . Cancer Father   . Diabetes Maternal Uncle   . Cancer Maternal Grandmother   . Cancer Maternal Grandfather   . Hypertension Maternal Grandfather   . Diabetes Maternal Grandfather   . Sickle cell anemia Son   . Asthma Son   . Heart disease Paternal Grandfather   . Hearing loss Paternal Aunt    Social History:  History  Alcohol Use No     History  Drug Use No    Social History   Social History  . Marital Status: Single    Spouse Name: N/A  . Number  of Children: N/A  . Years of Education: N/A   Social History Main Topics  . Smoking status: Never Smoker   . Smokeless tobacco: Never Used  . Alcohol Use: No  . Drug Use: No  . Sexual Activity: Yes    Birth Control/ Protection: Surgical   Other Topics Concern  . None   Social History Narrative   2 Adults, 4 children at home   FOB involved   Additional Social History:   Sleep: improved   Appetite:   Fair  Current Medications: Current Facility-Administered Medications  Medication Dose Route Frequency Provider Last Rate Last Dose  . buPROPion (WELLBUTRIN XL) 24 hr tablet 300 mg  300 mg Oral Daily Earney NavyJosephine C Onuoha, NP   300 mg at 04/21/15 0829  . citalopram (CELEXA) tablet 40 mg  40 mg Oral Daily Earney NavyJosephine C Onuoha, NP   40 mg at 04/21/15 0828  . feeding supplement (ENSURE ENLIVE) (ENSURE ENLIVE) liquid 237 mL  237 mL Oral BID BM Rockey SituFernando A Cobos, MD   237 mL at 04/20/15 1450  . hydrOXYzine (ATARAX/VISTARIL) tablet 50 mg  50 mg Oral Q6H PRN Sanjuana KavaAgnes I Nwoko, NP   50 mg at 04/20/15 2122  . ibuprofen (ADVIL,MOTRIN) tablet 600 mg  600 mg Oral Q6H PRN Sanjuana KavaAgnes I Nwoko, NP   600 mg at 04/19/15 1520  . methimazole (TAPAZOLE) tablet 20 mg  20 mg Oral BID Earney NavyJosephine C Onuoha, NP   20 mg at 04/21/15 0828  . methocarbamol (ROBAXIN) tablet 500 mg  500 mg Oral BID Sanjuana KavaAgnes I Nwoko, NP   500 mg at 04/21/15 [id number]  . metoprolol tartrate (LOPRESSOR) tablet 12.5 mg  12.5 mg Oral BID Earney NavyJosephine C Onuoha, NP   12.5 mg at 04/21/15 0828  . nitrofurantoin (MACRODANTIN) capsule 50 mg  50 mg Oral 4 times per day Craige CottaFernando A Cobos, MD   50 mg at 04/21/15 1122  . ondansetron (ZOFRAN) tablet 4 mg  4 mg Oral Q6H PRN Sanjuana KavaAgnes I Nwoko, NP   4 mg at 04/20/15 1822  . pantoprazole (PROTONIX) EC tablet 40 mg  40 mg Oral Daily Earney NavyJosephine C Onuoha, NP   40 mg at 04/21/15 0828  . QUEtiapine (SEROQUEL) tablet 200 mg  200 mg Oral QHS Sanjuana KavaAgnes I Nwoko, NP   200 mg at 04/20/15 2122  . QUEtiapine (SEROQUEL) tablet 25 mg  25 mg Oral TID Sanjuana KavaAgnes I Nwoko, NP   25 mg at 04/21/15 1121    Lab Results:  No results found for this or any previous visit (from the past 48 hour(s)). Physical Findings: AIMS: Facial and Oral Movements Muscles of Facial Expression: None, normal Lips and Perioral Area: None, normal Jaw: None, normal Tongue: None, normal,Extremity Movements Upper (arms, wrists, hands, fingers): None, normal Lower (legs, knees, ankles, toes):  None, normal, Trunk Movements Neck, shoulders, hips: None, normal, Overall Severity Severity of abnormal movements (highest score from questions above): None, normal Incapacitation due to abnormal movements: None, normal Patient's awareness of abnormal movements (rate only patient's report): No Awareness, Dental Status Current problems with teeth and/or dentures?: No Does patient usually wear dentures?: No  CIWA:  CIWA-Ar Total: 1 COWS:  COWS Total Score: 2  Musculoskeletal: Strength & Muscle Tone: within normal limits Gait & Station: normal Patient leans: N/A  Psychiatric Specialty Exam: Review of Systems  Constitutional: Negative.   Eyes: Negative.   Respiratory: Negative.   Cardiovascular: Negative.   Gastrointestinal: Negative.   Genitourinary: Negative.   Musculoskeletal: Negative.   Skin:  Negative.   Neurological: Negative.   Endo/Heme/Allergies: Negative.   Psychiatric/Behavioral: Positive for depression and suicidal ideas. Negative for hallucinations, memory loss and substance abuse. The patient is nervous/anxious. The patient does not have insomnia.    no fever, no chills, no urgency, slight  dysuria, no flank,  No pelvic pain, (+) nausea,no vomiting   Blood pressure 107/65, pulse 88, temperature 98.2 F (36.8 C), temperature source Oral, resp. rate 18, height 5' 4.25" (1.632 m), weight 67.132 kg (148 lb), last menstrual period 03/20/2015, SpO2 100 %.Body mass index is 25.21 kg/(m^2).  General Appearance: improved grooming   Eye Contact::   Improved   Speech:  Normal Rate  Volume:  Normal  Mood:  Still depressed, but presents with some improvement compared to admission  Affect:  Constricted, but does smile appropriately at times   Thought Process:  Linear  Orientation:  Other:  fully alert and attentive   Thought Content:  denies hallucinations,no delusions expressed, ruminative about stressors   Suicidal Thoughts:   Describes some passive thoughts of death, but at  this time denies plan or intention of hurting self, denies SI, also denies any plan or intention of hurting anyone .  Homicidal Thoughts:  No  Memory:  recent and remote grossly intact   Judgement:  Fair  Insight:  Fair  Psychomotor Activity:  Normal  Concentration:  Good  Recall:  Good  Fund of Knowledge:Good  Language: Good  Akathisia:  Negative  Handed:  Right  AIMS (if indicated):     Assets:  Desire for Improvement Resilience  ADL's:  Intact  Cognition: WNL  Sleep:  Number of Hours: 6.75   Assessment - Patient reports ongoing depression with minimal improvement although staff do feel she is improving objectively. Today, on 04/21/2015, medications will remain the same as it is too early to consider upward titration.    Treatment Plan Summary: Daily contact with patient to assess and evaluate symptoms and progress in treatment, Medication management, Plan inpatietn admission and medications as below  1. Encourage group participation to work on coping skills and symptom reduction 2.Continue Celexa 40 mgrs QDAY for depression and anxiety 3.Continue Wellbutrin XL 300 mgrs QDAY for depression 4. Increased Seroquel to 200 mgrs QHS to address insomnia/racing thoughts, add Seroquel 25 mg tid for agitation/anxiety & ruminations 5.Increased Hydroxyzine to 50 mgrs  Q 6 hours PRN  for management of anxiety as needed  6.Continue Tapazole / B blocker for management of Hyperthyroidism  7. Continue Macrodantin 50 mgrs Q 6 hours x 7 days for UTI. 8.Zofran PRNs for nausea, as needed. 9. Continue Robaxin 500 mg bid prn for muscle spasm, Ibuprofen 600 mg Q 6 hours prn for pain  Beau Fanny, FNP-BC 04/21/2015, 12:00PM

## 2015-04-21 NOTE — [facility] Group Notes (Signed)
[facility] Dis[facility] Planning Group Note  04/21/2015 8:45 AM  Participation Quality: Alert, Appropriate and Oriented  Pt did not attend, declined invitation.   Chad CordialLauren Carter, LCSWA 04/21/2015 10:17 AM

## 2015-04-21 NOTE — [facility] Group Notes (Signed)
[facility] LCSW Group Therapy  04/21/2015 1:15pm  Type of Therapy:  Group Therapy vercoming Obstacles  Participation Level:  Minimal  Participation Quality:  N/A- did not participate  Affect:  Flat  Cognitive:  Appropriate and Oriented  Insight:  Limited  Engagement in Therapy:  Limited  Modes of Intervention:  Discussion, Exploration, Problem-solving and Support  Description of Group:   In this group patients will be encouraged to explore what they see as obstacles to their own wellness and recovery. They will be guided to discuss their thoughts, feelings, and behaviors related to these obstacles. The group will process together ways to cope with barriers, with attention given to specific choices patients can make. Each patient will be challenged to identify changes they are motivated to make in order to overcome their obstacles. This group will be process-oriented, with patients participating in exploration of their own experiences as well as giving and receiving support and challenge from other group members.  Summary of Patient Progress: Pt continues to be disengaged in group discussion; did not participate in discussion and observed to have minimal eye contact and closed-off body language.   Therapeutic Modalities:   Cognitive Behavioral Therapy Solution Focused Therapy Motivational Interviewing Relapse Prevention Therapy   Chad CordialLauren Carter, LCSWA 04/21/2015 3:10 PM

## 2015-04-21 NOTE — Progress Notes (Signed)
Recreation Therapy Notes  Date: 01.02.2017 Time: 9:30am Location: 300 Group Room   Group Topic: Stress Management  Goal Area(s) Addresses:  Patient will actively participate in stress management techniques presented during session.   Behavioral Response: Did not attend.   Ellagrace Yoshida L Aryeh Butterfield, LRT/CTRS        Linnette Panella L 04/21/2015 10:23 AM 

## 2015-04-21 NOTE — Plan of Care (Signed)
Problem: Alteration in mood Goal: STG-Patient reports thoughts of self-harm to staff Outcome: Progressing Patient endorsing passive SI however denies plan, intent.  Problem: Diagnosis: Increased Risk For Suicide Attempt Goal: STG-Patient Will Comply With Medication Regime Outcome: Progressing Patient has been med compliant.

## 2015-04-21 NOTE — Progress Notes (Signed)
Adult Psychoeducational Group Note  Date:  04/21/2015 Time:  8:20 PM  Group Topic/Focus:  Wrap-Up Group:   The focus of this group is to help patients review their daily goal of treatment and discuss progress on daily workbooks.  Participation Level:  Minimal  Participation Quality:  Appropriate  Affect:  Appropriate  Cognitive:  Appropriate  Insight: Appropriate  Engagement in Group:  Engaged and Limited  Modes of Intervention:  Discussion  Additional Comments:  Pt rated overall day a 3 out of 10 because "I just didn't feel today, at all". Pt reported that her goal for the day was to attend all groups, which she says she did attend all but one. Pt noted that interaction with other patients during lunch was the highlight of her day.   Cleotilde NeerJasmine S Trindon Dorton 04/21/2015, 9:18 PM

## 2015-04-21 NOTE — Progress Notes (Signed)
Met with patient 1:1. She is flat, anxious and depressed both in affect and mood. Rates her depression and anxiety both at a 10/10 and hopelessness at a 7/10. States, "I'm just not feeling it today." When asked what was meant by that patient responded, "I'm just depressed and irritated." Endorsing passive SI but denies plan or intent. Continues to endorse passive HI toward her children's father but states, "he's not here so it's not a big deal." Patient medicated per orders. Support and reassurance offered. Self inventory reviewed. Patient verbally contracts for safety. Will continue to monitor closely. Grace [name]

## 2015-04-22 MED ORDER — HYDROXYZINE HCL 25 MG PO TABS
25.0000 mg | ORAL_TABLET | Freq: Four times a day (QID) | ORAL | Status: DC | PRN
  Administered 2015-04-22 – 2015-04-24 (×4): 25 mg via ORAL
  Filled 2015-04-22 (×2): qty 1
  Filled 2015-04-22: qty 10
  Filled 2015-04-22 (×2): qty 1

## 2015-04-22 NOTE — Progress Notes (Signed)
Adult Psychoeducational Group Note  Date:  04/22/2015 Time:  10:34 PM  Group Topic/Focus:  Wrap-Up Group:   The focus of this group is to help patients review their daily goal of treatment and discuss progress on daily workbooks.  Participation Level:  Active  Participation Quality:  Appropriate  Affect:  Appropriate  Cognitive:  Appropriate  Insight: Appropriate  Engagement in Group:  Engaged  Modes of Intervention:  Discussion  Additional Comments:  Patient stated having an okay day. Patient stated her day was better than yesterday. Patient stated something positive that happened today was being able to get her meds straight.   Grace [name] 04/22/2015, 10:34 PM

## 2015-04-22 NOTE — Progress Notes (Signed)
D: When asked about her day pt stated, "not good". Stated, "went home, thought I was doing ok, but the old way of thinking came back".  Pt has no other questions or concerns.    A:  Support and encouragement was offered. 15 min checks continued for safety.  R: Pt remains safe.

## 2015-04-22 NOTE — Progress Notes (Signed)
Patient ID: Grace [name], female   DOB: 02/29/1986, 30 y.o.   MRN: [id number] D   ---   Pt complains of infrequent  Minor pain and agrees to contract for safety.  Pt. Has attended groups with minimal participation.  She maintains a blank affect  With fair eye contact.  She has been pleasant to staff and peers.  Pt. Has reduced interaction with peers and prefers to be alone., even when in the dayroom.   Pt. Said she needs to learn to tell family and friends to deal with their own issues and to stop attempting tomdraw  Her into their problems.  Pt. Said other family and friends take advantage or her and that this needs to stop.  Pt wants to focus more on herself  And less on others.  ---  A --  Support and encouragement ---  R --  Pt. Remains safe on unit

## 2015-04-22 NOTE — [facility] Group Notes (Signed)
[facility] LCSW Group Therapy 04/22/2015 1:15 PM  Type of Therapy: Group Therapy- Feelings about Diagnosis  Participation Level: Minimal  Participation Quality:  Reserved  Affect:  Flat  Cognitive: Alert and Oriented   Insight:  Developing   Engagement in Therapy: Limited  Modes of Intervention: Clarification, Confrontation, Discussion, Education, Exploration, Limit-setting, Orientation, Problem-solving, Rapport Building, Dance movement psychotherapisteality Testing, Socialization and Support  Description of Group:   This group will allow patients to explore their thoughts and feelings about diagnoses they have received. Patients will be guided to explore their level of understanding and acceptance of these diagnoses. Facilitator will encourage patients to process their thoughts and feelings about the reactions of others to their diagnosis, and will guide patients in identifying ways to discuss their diagnosis with significant others in their lives. This group will be process-oriented, with patients participating in exploration of their own experiences as well as giving and receiving support and challenge from other group members.  Summary of Progress/Problems:  Pt continues to participate minimally and appears to be disengaged. Pt did identify that people saying "it's all in your head" in reference to her mental health symptoms is frustrating for her.  Therapeutic Modalities:   Cognitive Behavioral Therapy Solution Focused Therapy Motivational Interviewing Relapse Prevention Therapy  Chad CordialLauren Carter, LCSWA 04/22/2015 3:16 PM

## 2015-04-22 NOTE — Progress Notes (Addendum)
Patient ID: Grace [name], female   DOB: 12/31/1985, 30 y.o.   MRN: [id number] [facility] MD Progress Note  04/22/2015  EMMI WERTHEIM  MRN:  [id number]  Subjective:  Patient reports ongoing depression, anxiety, ruminations . Today spoke at more length about stressors- states she has upcoming court date for legal charges she states are unfounded. States " I have never been in legal trouble before, it makes me real anxious, although they told me I would most likely get probation". Denies medication side effects.   Objective: Pt seen and case discussed with treatment team. As above, patient reports ongoing depression and anxiety, which she attributes mostly to significant psychosocial stressors such as poor relationship with father of her children and legal issues as above . She acknowledges that these stressors are contributing to perpetuate her depression. She is more responsive to support, encouragement , empathy today, and was able to reflect " it is not the end of the world , maybe hearing [ court date] will go OK] Reports ongoing suicidal ideations- passive at this time- denies current plan or intention of hurting self and contracts for safety on unit. Remains fearful of discharging and states she still feels overwhelmed by stressors . No disruptive behaviors on unit. Going to groups, although as per staff participation tends to be minimal and she tends to present quiet, reserved . Denies medication side effects.  Principal Problem: MDD (major depressive disorder), recurrent episode, severe (HCC) Diagnosis:   Patient Active Problem List   Diagnosis Date Noted  . 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 MDD (major depressive disorder), recurrent episode, severe (HCC) [F33.2] 04/15/2015  . Hyperthyroidism [E05.90] 03/21/2015  . Tachycardia [R00.0] 03/21/2015  . Palpitations [R00.2] 03/21/2015  . Chest pain [R07.9] 03/21/2015  . MDD  (major depressive disorder), recurrent episode, moderate (HCC) [F33.1] 03/16/2015  . Consultation for female sterilization [Z30.09] 05/09/2013  . NSVD (normal spontaneous vaginal delivery) [O80] 03/30/2013  . Chronic hypertension in pregnancy [O10.919] 03/29/2013  . HTN in pregnancy, chronic [O10.919] 03/28/2013  . Transient hypertension of pregnancy, antepartum [O13.9] 03/20/2013  . GERD (gastroesophageal reflux disease) [K21.9] 01/18/2013  . Hyperemesis gravidarum before end of [redacted] week gestation with electrolyte imbalance [O21.1] 09/23/2012  . Trichomonas [A59.9] 09/23/2012  . Supervision of high-risk pregnancy [O09.90] 04/19/2011  . History of molar pregnancy, antepartum [O09.A0] 03/22/2011  . Hyperthyroidism, antepartum [F62.130, E07.9] 03/16/2011  . Sickle cell trait (HCC) [D57.3] 12/30/2010   Total Time spent with patient:  20 minutes   Past Medical History:  Past Medical History  Diagnosis Date  . Sickle cell trait (HCC)   . Hyperthyroidism   . Sickle cell trait (HCC)   . Cholecystitis 08/18/2011  . SVD (spontaneous vaginal delivery)     x 3 WH  . Hypertension   . History of recurrent UTIs     recurrent UTIs  . Headache(784.0)     otc med prn  . Joint pain     knees and shoulders - otc med prn    Past Surgical History  Procedure Laterality Date  . Cholecystectomy  08/19/2011    Procedure: LAPAROSCOPIC CHOLECYSTECTOMY WITH INTRAOPERATIVE CHOLANGIOGRAM;  Surgeon: Lodema Pilot, DO;  Location: [facility] OR;  Service: General;  Laterality: N/A;  . Dilation and curettage of uterus      molar preg  . Laparoscopic bilateral salpingectomy Bilateral 05/09/2013    Procedure: LAPAROSCOPIC BILATERAL SALPINGECTOMY;  Surgeon: Allie Bossier, MD;  Location: [facility] ORS;  Service: Gynecology;  Laterality: Bilateral;   Family History:  Family History  Problem Relation Age of Onset  . Diabetes Mother   . Cancer Father   . Diabetes Maternal Uncle   . Cancer Maternal Grandmother   . Cancer Maternal  Grandfather   . Hypertension Maternal Grandfather   . Diabetes Maternal Grandfather   . Sickle cell anemia Son   . Asthma Son   . Heart disease Paternal Grandfather   . Hearing loss Paternal Aunt    Social History:  History  Alcohol Use No     History  Drug Use No    Social History   Social History  . Marital Status: Single    Spouse Name: N/A  . Number of Children: N/A  . Years of Education: N/A   Social History Main Topics  . Smoking status: Never Smoker   . Smokeless tobacco: Never Used  . Alcohol Use: No  . Drug Use: No  . Sexual Activity: Yes    Birth Control/ Protection: Surgical   Other Topics Concern  . None   Social History Narrative   2 Adults, 4 children at home   FOB involved   Additional Social History:   Sleep: improved   Appetite:   Improved   Current Medications: Current Facility-Administered Medications  Medication Dose Route Frequency Provider Last Rate Last Dose  . buPROPion (WELLBUTRIN XL) 24 hr tablet 300 mg  300 mg Oral Daily Earney Navy, NP   300 mg at 04/22/15 0912  . citalopram (CELEXA) tablet 40 mg  40 mg Oral Daily Earney Navy, NP   40 mg at 04/22/15 0911  . feeding supplement (ENSURE ENLIVE) (ENSURE ENLIVE) liquid 237 mL  237 mL Oral BID BM Rockey Situ Tiquan Bouch, MD   237 mL at 04/20/15 1450  . hydrOXYzine (ATARAX/VISTARIL) tablet 50 mg  50 mg Oral Q6H PRN Sanjuana Kava, NP   50 mg at 04/21/15 2145  . ibuprofen (ADVIL,MOTRIN) tablet 600 mg  600 mg Oral Q6H PRN Sanjuana Kava, NP   600 mg at 04/22/15 1047  . methimazole (TAPAZOLE) tablet 20 mg  20 mg Oral BID Earney Navy, NP   20 mg at 04/22/15 0910  . methocarbamol (ROBAXIN) tablet 500 mg  500 mg Oral BID Sanjuana Kava, NP   500 mg at 04/22/15 0911  . metoprolol tartrate (LOPRESSOR) tablet 12.5 mg  12.5 mg Oral BID Earney Navy, NP   12.5 mg at 04/22/15 0910  . nitrofurantoin (MACRODANTIN) capsule 50 mg  50 mg Oral 4 times per day Craige Cotta, MD   50 mg at  04/22/15 1200  . ondansetron (ZOFRAN) tablet 4 mg  4 mg Oral Q6H PRN Sanjuana Kava, NP   4 mg at 04/20/15 1822  . pantoprazole (PROTONIX) EC tablet 40 mg  40 mg Oral Daily Earney Navy, NP   40 mg at 04/22/15 0910  . QUEtiapine (SEROQUEL) tablet 200 mg  200 mg Oral QHS Sanjuana Kava, NP   200 mg at 04/21/15 2145  . QUEtiapine (SEROQUEL) tablet 25 mg  25 mg Oral TID Sanjuana Kava, NP   25 mg at 04/22/15 1159    Lab Results:  No results found for this or any previous visit (from the past 48 hour(s)). Physical Findings: AIMS: Facial and Oral Movements Muscles of Facial Expression: None, normal Lips and Perioral Area: None, normal Jaw: None, normal Tongue: None, normal,Extremity Movements Upper (arms, wrists, hands,  fingers): None, normal Lower (legs, knees, ankles, toes): None, normal, Trunk Movements Neck, shoulders, hips: None, normal, Overall Severity Severity of abnormal movements (highest score from questions above): None, normal Incapacitation due to abnormal movements: None, normal Patient's awareness of abnormal movements (rate only patient's report): No Awareness, Dental Status Current problems with teeth and/or dentures?: No Does patient usually wear dentures?: No  CIWA:  CIWA-Ar Total: 1 COWS:  COWS Total Score: 2  Musculoskeletal: Strength & Muscle Tone: within normal limits Gait & Station: normal Patient leans: N/A  Psychiatric Specialty Exam: Review of Systems  Constitutional: Negative.   Eyes: Negative.   Respiratory: Negative.   Cardiovascular: Negative.   Gastrointestinal: Negative.   Genitourinary: Negative.   Musculoskeletal: Negative.   Skin: Negative.   Neurological: Negative.   Endo/Heme/Allergies: Negative.   Psychiatric/Behavioral: Positive for depression and suicidal ideas. Negative for hallucinations, memory loss and substance abuse. The patient is nervous/anxious. The patient does not have insomnia.    no fever, no chills, no urgency, slight   dysuria, no flank,  No pelvic pain, (+) nausea,no vomiting   Blood pressure 107/69, pulse 82, temperature 98.2 F (36.8 C), temperature source Oral, resp. rate 16, height 5' 4.25" (1.632 m), weight 148 lb (67.132 kg), last menstrual period 03/20/2015, SpO2 100 %.Body mass index is 25.21 kg/(m^2).  General Appearance: improved grooming   Eye Contact::   Improved   Speech:  Normal Rate  Volume:  Normal  Mood:  Depressed   Affect: less constricted than on admission, anxious about stressors as above   Thought Process:  Linear  Orientation:  Other:  fully alert and attentive   Thought Content:  denies hallucinations,no delusions expressed, ruminative about stressors   Suicidal Thoughts:   Ongoing passive thoughts of death, but at this time denies plan or intention of hurting self or of SI. Contracts for safety at this time  Homicidal Thoughts:  No-also, specifically denies any homicidal ideations towards the father of her children- states she plans on avoiding him completely after discharge   Memory:  recent and remote grossly intact   Judgement:  Improving   Insight:   Improving   Psychomotor Activity:  Normal  Concentration:  Good  Recall:  Good  Fund of Knowledge:Good  Language: Good  Akathisia:  Negative  Handed:  Right  AIMS (if indicated):     Assets:  Desire for Improvement Resilience  ADL's:  Intact  Cognition: WNL  Sleep:  Number of Hours: 6.75   Assessment - Although remains depressed, anxious, ruminative , at this time partially improved compared to admission and better able to process her stressors and discuss coping skills. Affect seems partially more reactive . Currently not internally preoccupied . Tolerating medications well .    Treatment Plan Summary: Daily contact with patient to assess and evaluate symptoms and progress in treatment, Medication management, Plan inpatietn admission and medications as below  1. Encourage group participation to work on coping skills  and symptom reduction 2.Continue Celexa 40 mgrs QDAY for depression and anxiety 3.Continue Wellbutrin XL 300 mgrs QDAY for depression 4. Continue  Seroquel  200 mgrs QHS to address insomnia/racing thoughts, add Seroquel 25 mg tid for agitation/anxiety & ruminations 5 Decrease  Hydroxyzine to 25 mgrs  Q 6 hours PRN  for management of anxiety as needed  6.Continue Tapazole / B blocker for management of Hyperthyroidism  7. Continue Macrodantin 50 mgrs Q 6 hours x 7 days for UTI. 8.Zofran PRNs for nausea, as needed. 9. Continue  Robaxin 500 mg bid prn for muscle spasm, Ibuprofen 600 mg Q 6 hours prn for pain 10. Treatment team working on disposition planning   Nehemiah MassedOBOS, Halla Chopp, MD  04/22/2015, 12:00PM

## 2015-04-22 NOTE — Progress Notes (Signed)
Recreation Therapy Notes  Animal-Assisted Activity (AAA) Program Checklist/Progress Notes Patient Eligibility Criteria Checklist & Daily Group note for Rec Tx Intervention  Date: 01.03.2017 Time: 2:45pm [address]    AAA/T Program Assumption of Risk Form signed by Patient/ or Parent Legal Guardian yes  Patient is free of allergies or sever asthma yes  Patient reports no fear of animals yes  Patient reports no history of cruelty to animals yes  Patient understands his/her participation is voluntary yes  Patient washes hands before animal contact yes  Patient washes hands after animal contact yes  Behavioral Response: Appropriate   Education: Hand Washing, Appropriate Animal Interaction   Education Outcome: Acknowledges education.   Clinical Observations/Feedback: Patient engaged appropriately with therapy dog and peers during session. Additionally patient asked appropriate questions about therapy dog and his training.   Marykay Lexenise L Abram Sax, LRT/CTRS  Ivadell Gaul L 04/22/2015 2:06 PM

## 2015-04-22 NOTE — Tx Team (Signed)
Interdisciplinary Treatment Plan Update (Adult) Date: 04/22/2015   Date: 04/22/2015 9:29 AM  Progress in Treatment:  Attending groups: Yes  Participating in groups: No  Taking medication as prescribed: Yes  Tolerating medication: Yes  Family/Significant othe contact made: No, Pt declines Patient understands diagnosis: Yes AEB seeking help with depression Discussing patient identified problems/goals with staff: Yes  Medical problems stabilized or resolved: Yes  Denies suicidal/homicidal ideation: No, Pt expresses HI towards father of children and passive SI Patient has not harmed self or Others: Yes   New problem(s) identified: None identified at this time.   Discharge Plan or Barriers: Pt will return to her sister's house and follow-up with [facility] of the [place].  Additional comments:  Patient and CSW reviewed pt's identified goals and treatment plan. Patient verbalized understanding and agreed to treatment plan. CSW reviewed [facility] "Discharge Process and Patient Involvement" Form. Pt verbalized understanding of information provided and signed form.   Reason for Continuation of Hospitalization:  Depression Medication stabilization Suicidal ideation  Estimated length of stay: 2-3 days  Review of initial/current patient goals per problem list:   1.  Goal(s): Patient will participate in aftercare plan  Met:  Yes  Target date: 3-5 days from date of admission   As evidenced by: Patient will participate within aftercare plan AEB aftercare provider and housing plan at discharge being identified.   04/17/15: Pt will DC home to sister's house and follow-up with [place].  2.  Goal (s): Patient will exhibit decreased depressive symptoms and suicidal ideations.  Met:  No  Target date: 3-5 days from date of admission   As evidenced by: Patient will utilize self rating of depression at 3 or below and demonstrate decreased signs of depression or be deemed  stable for discharge by MD.  04/17/15: Pt rates depression at 9/10; endorses passive SI  04/22/15: Pt continues to rate depression at a high rate, 10/10; endorses SI on the unit but contracts for safety. Attendees:  Patient:    Family:    Physician: Dr. Parke Poisson, MD  04/22/2015 9:29 AM  Nursing:  04/22/2015 9:29 AM  Clinical Social Worker Peri Maris, [place] 04/22/2015 9:29 AM  Other: Tilden Fossa, [place] 04/22/2015 9:29 AM  Clinical:  Ashby Dawes, RN 04/22/2015 9:29 AM  Other: , RN Charge Nurse 04/22/2015 9:29 AM  Other:     Peri Maris, [place] Social Work [phone]

## 2015-04-22 NOTE — [facility] Group Notes (Signed)
[facility] Group Notes:  (Nursing/MHT/Case Management/Adjunct)  Date:  04/22/2015  Time:  10:56 AM  Type of Therapy:  Psychoeducational Skills  Participation Level:  Minimal  Participation Quality:  Appropriate  Affect:  Flat  Cognitive:  Alert  Insight:  None  Engagement in Group:  None  Modes of Intervention:  Discussion  Summary of Progress/Problems:   Pt. Attended group , but sat with a blanket and did not offer any comments  And did not share or support others .    Arsenio LoaderHiatt, Hatsumi Steinhart Dudley 04/22/2015, 10:56 AM

## 2015-04-23 NOTE — Progress Notes (Signed)
Patient ID: Grace [name], female   DOB: [birth year], 30 y.o.   MRN: [id number] [facility] MD Progress Note  04/23/2015  Grace [name]  MRN:  [id number]  Subjective:  Patient acknowledges partial improvement of mood , although states she continues to have suicidal ideations " on and off" ( is able to contract for safety on the unit at present and currently denies any current plan/intention to hurt self ). Does endorse significant anxiety, worry, particularly about her legal issues, which she ruminates about ( upcoming court date later this month). Denies medication side effects.   Objective: Pt seen and case discussed with treatment team. Patient has reported increased anxiety, which she attributes to upcoming legal proceedings/charges against her, but acknowledges some improvement of mood . States she has episodic auditory hallucinations where she hears her voice being called, but does not appear internally preoccupied at this time. No delusions expressed . Visible on unit, going to some groups, interactions with other patients is limited . There is some noticeable improvement compared to her admission presentation- in particular is presenting with improved range of affect, improved relatedness ( seems more relaxed in demeanor, improved eye contact ), and increasingly future oriented. Denies medication side effects. Reports chronic, ongoing passive  Thoughts of death, dying, does not endorse actual suicidal plan or intentions and contracts for safety on the unit.   Principal Problem: MDD (major depressive disorder), recurrent episode, severe (HCC) Diagnosis:   Patient Active Problem List   Diagnosis Date Noted  . 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 MDD (major depressive disorder), recurrent episode, severe (HCC) [F33.2] 04/15/2015  . Hyperthyroidism [E05.90] 03/21/2015  . Tachycardia [R00.0] 03/21/2015  . Palpitations [R00.2]  03/21/2015  . Chest pain [R07.9] 03/21/2015  . MDD (major depressive disorder), recurrent episode, moderate (HCC) [F33.1] 03/16/2015  . Consultation for female sterilization [Z30.09] 05/09/2013  . NSVD (normal spontaneous vaginal delivery) [O80] 03/30/2013  . Chronic hypertension in pregnancy [O10.919] 03/29/2013  . HTN in pregnancy, chronic [O10.919] 03/28/2013  . Transient hypertension of pregnancy, antepartum [O13.9] 03/20/2013  . GERD (gastroesophageal reflux disease) [K21.9] 01/18/2013  . Hyperemesis gravidarum before end of [redacted] week gestation with electrolyte imbalance [O21.1] 09/23/2012  . Trichomonas [A59.9] 09/23/2012  . Supervision of high-risk pregnancy [O09.90] 04/19/2011  . History of molar pregnancy, antepartum [O09.A0] 03/22/2011  . Hyperthyroidism, antepartum [V25.366, E07.9] 03/16/2011  . Sickle cell trait (HCC) [D57.3] 12/30/2010   Total Time spent with patient:  20 minutes   Past Medical History:  Past Medical History  Diagnosis Date  . Sickle cell trait (HCC)   . Hyperthyroidism   . Sickle cell trait (HCC)   . Cholecystitis 08/18/2011  . SVD (spontaneous vaginal delivery)     x 3 WH  . Hypertension   . History of recurrent UTIs     recurrent UTIs  . Headache(784.0)     otc med prn  . Joint pain     knees and shoulders - otc med prn    Past Surgical History  Procedure Laterality Date  . Cholecystectomy  08/19/2011    Procedure: LAPAROSCOPIC CHOLECYSTECTOMY WITH INTRAOPERATIVE CHOLANGIOGRAM;  Surgeon: Lodema Pilot, DO;  Location: [facility] OR;  Service: General;  Laterality: N/A;  . Dilation and curettage of uterus      molar preg  . Laparoscopic bilateral salpingectomy Bilateral 05/09/2013    Procedure: LAPAROSCOPIC BILATERAL SALPINGECTOMY;  Surgeon: Allie Bossier, MD;  Location: [facility] ORS;  Service: Gynecology;  Laterality: Bilateral;   Family History:  Family History  Problem Relation Age of Onset  . Diabetes Mother   . Cancer Father   . Diabetes Maternal Uncle    . Cancer Maternal Grandmother   . Cancer Maternal Grandfather   . Hypertension Maternal Grandfather   . Diabetes Maternal Grandfather   . Sickle cell anemia Son   . Asthma Son   . Heart disease Paternal Grandfather   . Hearing loss Paternal Aunt    Social History:  History  Alcohol Use No     History  Drug Use No    Social History   Social History  . Marital Status: Single    Spouse Name: N/A  . Number of Children: N/A  . Years of Education: N/A   Social History Main Topics  . Smoking status: Never Smoker   . Smokeless tobacco: Never Used  . Alcohol Use: No  . Drug Use: No  . Sexual Activity: Yes    Birth Control/ Protection: Surgical   Other Topics Concern  . None   Social History Narrative   2 Adults, 4 children at home   FOB involved   Additional Social History:   Sleep: improved   Appetite:   Improved   Current Medications: Current Facility-Administered Medications  Medication Dose Route Frequency Provider Last Rate Last Dose  . buPROPion (WELLBUTRIN XL) 24 hr tablet 300 mg  300 mg Oral Daily Earney NavyJosephine C Onuoha, NP   300 mg at 04/23/15 0913  . citalopram (CELEXA) tablet 40 mg  40 mg Oral Daily Earney NavyJosephine C Onuoha, NP   40 mg at 04/23/15 0914  . feeding supplement (ENSURE ENLIVE) (ENSURE ENLIVE) liquid 237 mL  237 mL Oral BID BM Rockey SituFernando A Cobos, MD   237 mL at 04/20/15 1450  . hydrOXYzine (ATARAX/VISTARIL) tablet 25 mg  25 mg Oral Q6H PRN Craige CottaFernando A Cobos, MD   25 mg at 04/22/15 1907  . ibuprofen (ADVIL,MOTRIN) tablet 600 mg  600 mg Oral Q6H PRN Sanjuana KavaAgnes I Nwoko, NP   600 mg at 04/22/15 1047  . methimazole (TAPAZOLE) tablet 20 mg  20 mg Oral BID Earney NavyJosephine C Onuoha, NP   20 mg at 04/23/15 0912  . methocarbamol (ROBAXIN) tablet 500 mg  500 mg Oral BID Sanjuana KavaAgnes I Nwoko, NP   500 mg at 04/23/15 0912  . metoprolol tartrate (LOPRESSOR) tablet 12.5 mg  12.5 mg Oral BID Earney NavyJosephine C Onuoha, NP   12.5 mg at 04/23/15 0913  . nitrofurantoin (MACRODANTIN) capsule 50 mg  50  mg Oral 4 times per day Craige CottaFernando A Cobos, MD   50 mg at 04/23/15 0913  . ondansetron (ZOFRAN) tablet 4 mg  4 mg Oral Q6H PRN Sanjuana KavaAgnes I Nwoko, NP   4 mg at 04/20/15 1822  . pantoprazole (PROTONIX) EC tablet 40 mg  40 mg Oral Daily Earney NavyJosephine C Onuoha, NP   40 mg at 04/23/15 0912  . QUEtiapine (SEROQUEL) tablet 200 mg  200 mg Oral QHS Sanjuana KavaAgnes I Nwoko, NP   200 mg at 04/22/15 2126  . QUEtiapine (SEROQUEL) tablet 25 mg  25 mg Oral TID Sanjuana KavaAgnes I Nwoko, NP   25 mg at 04/23/15 [id number]    Lab Results:  No results found for this or any previous visit (from the past 48 hour(s)). Physical Findings: AIMS: Facial and Oral Movements Muscles of Facial Expression: None, normal Lips and Perioral Area: None, normal Jaw: None, normal Tongue: None, normal,Extremity Movements Upper (arms, wrists, hands, fingers): None, normal  Lower (legs, knees, ankles, toes): None, normal, Trunk Movements Neck, shoulders, hips: None, normal, Overall Severity Severity of abnormal movements (highest score from questions above): None, normal Incapacitation due to abnormal movements: None, normal Patient's awareness of abnormal movements (rate only patient's report): No Awareness, Dental Status Current problems with teeth and/or dentures?: No Does patient usually wear dentures?: No  CIWA:  CIWA-Ar Total: 1 COWS:  COWS Total Score: 2  Musculoskeletal: Strength & Muscle Tone: within normal limits Gait & Station: normal Patient leans: N/A  Psychiatric Specialty Exam: Review of Systems  Constitutional: Negative.   Eyes: Negative.   Respiratory: Negative.   Cardiovascular: Negative.   Gastrointestinal: Negative.   Genitourinary: Negative.   Musculoskeletal: Negative.   Skin: Negative.   Neurological: Negative.   Endo/Heme/Allergies: Negative.   Psychiatric/Behavioral: Positive for depression and suicidal ideas. Negative for hallucinations, memory loss and substance abuse. The patient is nervous/anxious. The patient does not have  insomnia.    no fever, no chills, no urgency, slight  dysuria, no flank,  No pelvic pain, (+) nausea,no vomiting   Blood pressure 106/69, pulse 84, temperature 98.6 F (37 C), temperature source Oral, resp. rate 16, height 5' 4.25" (1.632 m), weight 148 lb (67.132 kg), last menstrual period 03/20/2015, SpO2 100 %.Body mass index is 25.21 kg/(m^2).  General Appearance: improved grooming   Eye Contact::   Improved   Speech:  Normal Rate  Volume:  Normal  Mood:  Improved mood, but still feels depressed   Affect: less constricted, anxious about stressors as above   Thought Process:  Linear  Orientation:  Other:  fully alert and attentive   Thought Content:  denies hallucinations,no delusions expressed, ruminative about stressors   Suicidal Thoughts:   Ongoing passive thoughts of death, but at this time denies plan or intention of hurting self or of SI. Contracts for safety at this time  Homicidal Thoughts:  No-also, specifically denies any homicidal ideations towards the father of her children- states she plans on avoiding him completely after discharge   Memory:  recent and remote grossly intact   Judgement:  Improving   Insight:   Improving   Psychomotor Activity:  Normal  Concentration:  Good  Recall:  Good  Fund of Knowledge:Good  Language: Good  Akathisia:  Negative  Handed:  Right  AIMS (if indicated):     Assets:  Desire for Improvement Resilience  ADL's:  Intact  Cognition: WNL  Sleep:  Number of Hours: 6.75   Assessment - Overall, is reporting gradual /partial improvement of mood but persistence of anxiety symptoms, which she currently attributes mostly to upcoming court date. She is presenting with improved eye contact, improved range of affect. Denies medication side effects. Ongoing passive SI but denies plan or intent to hurt self .     Treatment Plan Summary: Daily contact with patient to assess and evaluate symptoms and progress in treatment, Medication management,  Plan inpatietn admission and medications as below  1. Encourage group participation to work on coping skills and symptom reduction 2.Continue Celexa 40 mgrs QDAY for depression and anxiety 3.Continue Wellbutrin XL 300 mgrs QDAY for depression 4. Continue  Seroquel  200 mgrs QHS to address insomnia/racing thoughts, add Seroquel 25 mg tid for agitation/anxiety & ruminations 5 Continue Hydroxyzine to 25 mgrs  Q 6 hours PRN  for management of anxiety as needed  6.Continue Tapazole / B blocker for management of Hyperthyroidism  7. Continue Macrodantin 50 mgrs Q 6 hours x 7 days for UTI.  8.Zofran PRNs for nausea, as needed. 9. Continue Robaxin 500 mg bid prn for muscle spasm, Ibuprofen 600 mg Q 6 hours prn for pain 10. Treatment team working on disposition planning   Nehemiah Massed, MD  04/23/2015, 12:00PM

## 2015-04-23 NOTE — [facility] Group Notes (Signed)
[facility] LCSW Group Therapy 04/23/2015 1:15 PM  Type of Therapy: Group Therapy- Emotion Regulation  Participation Level: Minimal  Participation Quality:  Reserved  Affect: Flat  Cognitive: Alert and Oriented   Insight:  Limited  Engagement in Therapy: Improving   Modes of Intervention: Clarification, Confrontation, Discussion, Education, Exploration, Limit-setting, Orientation, Problem-solving, Rapport Building, Dance movement psychotherapisteality Testing, Socialization and Support  Summary of Progress/Problems: The topic for group today was emotional regulation. This group focused on both positive and negative emotion identification and allowed group members to process ways to identify feelings, regulate negative emotions, and find healthy ways to manage internal/external emotions. Group members were asked to reflect on a time when their reaction to an emotion led to a negative outcome and explored how alternative responses using emotion regulation would have benefited them. Group members were also asked to discuss a time when emotion regulation was utilized when a negative emotion was experienced. Pt participated minimally but did identify anger as an emotion that is difficult to regulate. She identified a warning sign of agitation and reports that she could take a walk prior to feeling physiological symptoms of her anger.   Grace CordialLauren [name], LCSWA 04/23/2015 3:42 PM

## 2015-04-23 NOTE — Progress Notes (Signed)
Patient's status remains fairly unchanged. Flat, depressed and anxious. Rating her depression at a 7/10, hopelessness at a 5/10 and anxiety at a 10/10. Verbalizes slight improvement in depression however continues to endorse passive SI. No plan or intent. Reports her goal is to avoid isolation and she plans to attend groups to do so. She is observed in the dayroom however keeps to self frequently. She reported vomiting x 1 this AM after med admin and states she believes this is due to not eating breakfast. Medicated per orders. Support and reassurance offered. Discussed patient's status with tx team. No further episodes of N/V. She remains safe on level III obs. Lawrence MarseillesFriedman, Glover Capano Eakes

## 2015-04-23 NOTE — [facility] Group Notes (Signed)
[facility]  04/23/2015 8:45 AM  Participation Quality: Alert, Appropriate and Oriented  Mood/Affect: Improving  Depression Rating: 6  Anxiety Rating: 10  Thoughts of Suicide: Pt endorsees SI "off and on"  Will you contract for safety? Yes  Current AVH: Pt denies  Plan for Discharge/Comments: Pt attended discharge planning group and actively participated in group. CSW discussed suicide prevention education with the group and encouraged them to discuss discharge planning and any relevant barriers. Pt reports improving depression, however still endorses SI. Pt complains of heightened anxiety.  Transportation Means: Pt reports access to transportation  Supports: No supports mentioned at this time  Chad CordialLauren Carter, LCSWA 04/23/2015 9:57 AM

## 2015-04-23 NOTE — Progress Notes (Signed)
D: Pt informed the writer that she is worried because she has a pending court date on the 24th of Jan. Stated, she has several charges from her ex boyfriend. "When I won't do what he wants he gets mad". Stated a charge for obtaining good by false pretenses, and fraudulent card. Stated he day was "up and down", because she couldn't stop thinking about the charges. Pt has no other questions or concerns.    A:  Support and encouragement was offered. 15 min checks continued for safety.  R: Pt remains safe.

## 2015-04-23 NOTE — Plan of Care (Signed)
Problem: Ineffective individual coping Goal: STG: Patient will remain free from self harm Outcome: Progressing Patient has not engaged in self harm. She does endorse passive SI however denies intent, plan to harm self.  Goal: STG:Pt. will utilize relaxation techniques to reduce stress STG: Patient will utilize relaxation techniques to reduce stress levels  Outcome: Progressing Patient is able to verbalize coping skills and states they are "somewhat" helpful.

## 2015-04-23 NOTE — Progress Notes (Signed)
Recreation Therapy Notes  Date: 01.04.2017 Time: 9:30am Location: [address] Group Room   Group Topic: Stress Management  Goal Area(s) Addresses:  Patient will actively participate in stress management techniques presented during session.   Behavioral Response: Did not attend.   Mckinnon Glick L Kennetta Pavlovic, LRT/CTRS        Skila Rollins L 04/23/2015 12:50 PM 

## 2015-04-23 NOTE — Progress Notes (Signed)
Adult Psychoeducational Group Note  Date:  04/23/2015 Time:  11:05 PM  Group Topic/Focus:  Wrap-Up Group:   The focus of this group is to help patients review their daily goal of treatment and discuss progress on daily workbooks.  Participation Level:  Active  Participation Quality:  Appropriate  Affect:  Appropriate  Cognitive:  Alert  Insight: Appropriate  Engagement in Group:  Engaged  Modes of Intervention:  Discussion  Additional Comments:  Patient stated her goal for today was to not miss any groups. Patient stated she met her goal. Patient rated her day as a 2, but ended as a 7. Patient stated she was feeling well today, but her day was better than yesterday.   Grace [name] L [name] 04/23/2015, 11:05 PM

## 2015-04-24 NOTE — Progress Notes (Signed)
Patient remains blank, flat but admits to some improvement in mood. Depression is at a 7/10, anxiety at a 1/10. Goal is to not isolate and patient has been visible in the dayroom though interaction with peers is limited. Patient with frequent needs - nauseated, anxious. Interventions provided with some relief. Medicated per orders without difficulty. Support offered. She endorses passive SI "sometimes" and states, "it's more I think about hurting myself, not suicide." Verbally contracts for safety. No HI. Patient remains safe on level III obs. Lawrence MarseillesFriedman, Kaliann Coryell Eakes

## 2015-04-24 NOTE — Progress Notes (Signed)
Patient ID: Grace [name], female   DOB: 04/24/1985, 30 y.o.   MRN: [id number] [facility] MD Progress Note  04/24/2015  VERSA CRATON  MRN:  [id number]  Subjective: Reports partial improvement of mood, is less depressed. Continues to report significant anxiety, but presents calmer. Denies medication side effects.    Objective: Pt seen and case discussed with treatment team. On unit, has been calm- no disruptive or agitated behaviors. Group attendance and visibility in milieu have improved.  She states that she feels she has an " anger problem", particularly insofar as her relationship with the father of her children. States her plan is to avoid him, and has considered seeking legal protection from him. She does state '" he is a good father, and he has the kids right now". She reports some persistent auditory hallucinations- " my name being called ", but states this is better and does not present internally preoccupied or paranoid. Denies medication side effects. Suicidal ideations have subsided,at this time denies any suicidal plan or intention, and describes passive thoughts of death , associated with anxiety, but no actual desire or intention of hurting self.   Principal Problem: MDD (major depressive disorder), recurrent episode, severe (HCC) Diagnosis:   Patient Active Problem List   Diagnosis Date Noted  . 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 MDD (major depressive disorder), recurrent episode, severe (HCC) [F33.2] 04/15/2015  . Hyperthyroidism [E05.90] 03/21/2015  . Tachycardia [R00.0] 03/21/2015  . Palpitations [R00.2] 03/21/2015  . Chest pain [R07.9] 03/21/2015  . MDD (major depressive disorder), recurrent episode, moderate (HCC) [F33.1] 03/16/2015  . Consultation for female sterilization [Z30.09] 05/09/2013  . NSVD (normal spontaneous vaginal delivery) [O80] 03/30/2013  . Chronic hypertension in pregnancy [O10.919]  03/29/2013  . HTN in pregnancy, chronic [O10.919] 03/28/2013  . Transient hypertension of pregnancy, antepartum [O13.9] 03/20/2013  . GERD (gastroesophageal reflux disease) [K21.9] 01/18/2013  . Hyperemesis gravidarum before end of [redacted] week gestation with electrolyte imbalance [O21.1] 09/23/2012  . Trichomonas [A59.9] 09/23/2012  . Supervision of high-risk pregnancy [O09.90] 04/19/2011  . History of molar pregnancy, antepartum [O09.A0] 03/22/2011  . Hyperthyroidism, antepartum [N82.956, E07.9] 03/16/2011  . Sickle cell trait (HCC) [D57.3] 12/30/2010   Total Time spent with patient:  20 minutes   Past Medical History:  Past Medical History  Diagnosis Date  . Sickle cell trait (HCC)   . Hyperthyroidism   . Sickle cell trait (HCC)   . Cholecystitis 08/18/2011  . SVD (spontaneous vaginal delivery)     x 3 WH  . Hypertension   . History of recurrent UTIs     recurrent UTIs  . Headache(784.0)     otc med prn  . Joint pain     knees and shoulders - otc med prn    Past Surgical History  Procedure Laterality Date  . Cholecystectomy  08/19/2011    Procedure: LAPAROSCOPIC CHOLECYSTECTOMY WITH INTRAOPERATIVE CHOLANGIOGRAM;  Surgeon: Lodema Pilot, DO;  Location: [facility] OR;  Service: General;  Laterality: N/A;  . Dilation and curettage of uterus      molar preg  . Laparoscopic bilateral salpingectomy Bilateral 05/09/2013    Procedure: LAPAROSCOPIC BILATERAL SALPINGECTOMY;  Surgeon: Allie Bossier, MD;  Location: [facility]S;  Service: Gynecology;  Laterality: Bilateral;   Family History:  Family History  Problem Relation Age of Onset  . Diabetes Mother   . Cancer Father   . Diabetes Maternal Uncle   . Cancer Maternal Grandmother   .  Cancer Maternal Grandfather   . Hypertension Maternal Grandfather   . Diabetes Maternal Grandfather   . Sickle cell anemia Son   . Asthma Son   . Heart disease Paternal Grandfather   . Hearing loss Paternal Aunt    Social History:  History  Alcohol Use No      History  Drug Use No    Social History   Social History  . Marital Status: Single    Spouse Name: N/A  . Number of Children: N/A  . Years of Education: N/A   Social History Main Topics  . Smoking status: Never Smoker   . Smokeless tobacco: Never Used  . Alcohol Use: No  . Drug Use: No  . Sexual Activity: Yes    Birth Control/ Protection: Surgical   Other Topics Concern  . None   Social History Narrative   2 Adults, 4 children at home   FOB involved   Additional Social History:   Sleep: improved   Appetite:   Improved   Current Medications: Current Facility-Administered Medications  Medication Dose Route Frequency Provider Last Rate Last Dose  . buPROPion (WELLBUTRIN XL) 24 hr tablet 300 mg  300 mg Oral Daily Earney NavyJosephine C Onuoha, NP   300 mg at 04/24/15 0821  . citalopram (CELEXA) tablet 40 mg  40 mg Oral Daily Earney NavyJosephine C Onuoha, NP   40 mg at 04/24/15 [id number]  . feeding supplement (ENSURE ENLIVE) (ENSURE ENLIVE) liquid 237 mL  237 mL Oral BID BM Rockey SituFernando A Shaia Porath, MD   237 mL at 04/20/15 1450  . hydrOXYzine (ATARAX/VISTARIL) tablet 25 mg  25 mg Oral Q6H PRN Craige CottaFernando A Jackquelyn Sundberg, MD   25 mg at 04/24/15 1452  . ibuprofen (ADVIL,MOTRIN) tablet 600 mg  600 mg Oral Q6H PRN Sanjuana KavaAgnes I Nwoko, NP   600 mg at 04/22/15 1047  . methimazole (TAPAZOLE) tablet 20 mg  20 mg Oral BID Earney NavyJosephine C Onuoha, NP   20 mg at 04/24/15 [id number]  . methocarbamol (ROBAXIN) tablet 500 mg  500 mg Oral BID Sanjuana KavaAgnes I Nwoko, NP   500 mg at 04/24/15 [id number]  . metoprolol tartrate (LOPRESSOR) tablet 12.5 mg  12.5 mg Oral BID Earney NavyJosephine C Onuoha, NP   12.5 mg at 04/24/15 [id number]  . nitrofurantoin (MACRODANTIN) capsule 50 mg  50 mg Oral 4 times per day Craige CottaFernando A Shamila Lerch, MD   50 mg at 04/24/15 1114  . ondansetron (ZOFRAN) tablet 4 mg  4 mg Oral Q6H PRN Sanjuana KavaAgnes I Nwoko, NP   4 mg at 04/20/15 1822  . pantoprazole (PROTONIX) EC tablet 40 mg  40 mg Oral Daily Earney NavyJosephine C Onuoha, NP   40 mg at 04/24/15 [id number]  . QUEtiapine (SEROQUEL)  tablet 200 mg  200 mg Oral QHS Sanjuana KavaAgnes I Nwoko, NP   200 mg at 04/23/15 2130  . QUEtiapine (SEROQUEL) tablet 25 mg  25 mg Oral TID Sanjuana KavaAgnes I Nwoko, NP   25 mg at 04/24/15 1114    Lab Results:  No results found for this or any previous visit (from the past 48 hour(s)). Physical Findings: AIMS: Facial and Oral Movements Muscles of Facial Expression: None, normal Lips and Perioral Area: None, normal Jaw: None, normal Tongue: None, normal,Extremity Movements Upper (arms, wrists, hands, fingers): None, normal Lower (legs, knees, ankles, toes): None, normal, Trunk Movements Neck, shoulders, hips: None, normal, Overall Severity Severity of abnormal movements (highest score from questions above): None, normal Incapacitation due to abnormal movements: None, normal Patient's awareness of abnormal movements (  rate only patient's report): No Awareness, Dental Status Current problems with teeth and/or dentures?: No Does patient usually wear dentures?: No  CIWA:  CIWA-Ar Total: 1 COWS:  COWS Total Score: 2  Musculoskeletal: Strength & Muscle Tone: within normal limits Gait & Station: normal Patient leans: N/A  Psychiatric Specialty Exam: Review of Systems  Constitutional: Negative.   Eyes: Negative.   Respiratory: Negative.   Cardiovascular: Negative.   Gastrointestinal: Negative.   Genitourinary: Negative.   Musculoskeletal: Negative.   Skin: Negative.   Neurological: Negative.   Endo/Heme/Allergies: Negative.   Psychiatric/Behavioral: Positive for depression and suicidal ideas. Negative for hallucinations, memory loss and substance abuse. The patient is nervous/anxious. The patient does not have insomnia.    no fever, no chills, no urgency, slight  dysuria, no flank,  No pelvic pain, (+) nausea,no vomiting   Blood pressure 96/59, pulse 94, temperature 98.1 F (36.7 C), temperature source Oral, resp. rate 16, height 5' 4.25" (1.632 m), weight 148 lb (67.132 kg), last menstrual period  03/20/2015, SpO2 100 %.Body mass index is 25.21 kg/(m^2).  General Appearance: improved grooming   Eye Contact::   Improved   Speech:  Normal Rate  Volume:  Normal  Mood:  Mood is partially improved compared to admssion  Affect: less constricted,  Still anxious  Thought Process:  Linear  Orientation:  Other:  fully alert and attentive   Thought Content:  Auditory hallucinations- name being called, are described as occasional- no delusions expressed   Suicidal Thoughts:   Ongoing passive thoughts of death, which appear to be decreasing in frequency and severity, at this time denies plan or intention of hurting self or of SI. Contracts for safety at this time  Homicidal Thoughts:  No-also, specifically denies any homicidal ideations towards the father of her children- states she plans on avoiding him completely after discharge   Memory:  recent and remote grossly intact   Judgement:  Improving   Insight:   Improving   Psychomotor Activity:  Normal  Concentration:  Good  Recall:  Good  Fund of Knowledge:Good  Language: Good  Akathisia:  Negative  Handed:  Right  AIMS (if indicated):     Assets:  Desire for Improvement Resilience  ADL's:  Intact  Cognition: WNL  Sleep:  Number of Hours: 6.25   Assessment - at this time patient presents with partial improvement compared to admission- she presents with decreasing depression, and although still anxious and ruminative, seems better able to discuss discharge planning options without feeling overwhelmed. At this time tolerating medications well. Behavior on unit in good control.    Treatment Plan Summary: Daily contact with patient to assess and evaluate symptoms and progress in treatment, Medication management, Plan inpatietn admission and medications as below  1. Encourage group participation to work on coping skills and symptom reduction 2.Continue Celexa 40 mgrs QDAY for depression and anxiety 3.Continue Wellbutrin XL 300 mgrs QDAY  for depression 4. Continue  Seroquel  200 mgrs QHS to address insomnia/racing thoughts, add Seroquel 25 mg tid for agitation/anxiety & ruminations 5 Continue Hydroxyzine to 25 mgrs  Q 6 hours PRN  for management of anxiety as needed  6.Continue Tapazole / B blocker for management of Hyperthyroidism  7. Continue Macrodantin 50 mgrs Q 6 hours x 7 days for UTI. 8.Zofran PRNs for nausea, as needed. 9. Continue Robaxin 500 mg bid prn for muscle spasm, Ibuprofen 600 mg Q 6 hours prn for pain 10. Treatment team working on disposition planning  Nehemiah Massed, MD  04/24/2015, 12:00PM

## 2015-04-24 NOTE — [facility] Group Notes (Signed)
[facility] 04/24/2015 1:15pm  Type of Therapy: Mental Health Association Presentation  Participation Level: Minimal  Participation Quality: Disnegaged  Affect: Flat  Cognitive: Oriented  Insight: Developing/Improving  Engagement in Therapy: Limited  Modes of Intervention: Discussion, Education and Socialization  Summary of Progress/Problems: Mental Health Association (MHA) Speaker came to talk about his personal journey with substance abuse and addiction. Pt appeared to sleep through group.    Chad CordialLauren Carter, LCSWA 04/24/2015 1:34 PM

## 2015-04-24 NOTE — Progress Notes (Signed)
Adult Psychoeducational Group Note  Date:  04/24/2015 Time:  10:14 PM  Group Topic/Focus:  Wrap-Up Group:   The focus of this group is to help patients review their daily goal of treatment and discuss progress on daily workbooks.  Participation Level:  Active  Participation Quality:  Appropriate  Affect:  Appropriate  Cognitive:  Alert  Insight: Appropriate  Engagement in Group:  Engaged  Modes of Intervention:  Discussion  Additional Comments:  Patient stated having a good day. Patient stated her goal for today was to not miss any groups. Patient stated she met her goal.  Maxwell Lemen L Cleon Thoma 04/24/2015, 10:14 PM

## 2015-04-24 NOTE — Plan of Care (Signed)
Problem: Alteration in mood; excessive anxiety as evidenced by: Goal: STG-Pt can identify coping skills to manage panic/anxiety (Patient can identify at least ____ coping skills to manage panic/anxiety attack)  Outcome: Progressing Client identifies 2 coping skills to manage anger and anxiety AEB acknowledging "my mood is up and down" "I have anger problems" "people think I'm mean or hateful but I know I have these problems so I just don't talk, I stay to my self" "coloring helps"

## 2015-04-24 NOTE — Plan of Care (Signed)
Problem: Alteration in mood; excessive anxiety as evidenced by: Goal: STG-Patient can identify triggers for anxiety Outcome: Progressing Patient stressed about upcoming court date.  Problem: Diagnosis: Increased Risk For Suicide Attempt Goal: STG-Patient Will Attend All Groups On The Unit Outcome: Progressing Patient attending majority of groups.

## 2015-04-24 NOTE — Progress Notes (Signed)
Patient ID: Grace [name], female   DOB: 03/04/1986, 30 y.o.   MRN: [id number] D: Client denies depression today, smiling "I'm going home tomorrow, I feel ready" "I been going to groups, practicing using coping skills" "I have an appointment with a therapist" A: Writer commended client on using coping skills encouraged to continue medications and keep appointment with therapist. Medication reviewed and administered as ordered. Staff will monitor q3515min for safety. R: Client is safe on the unit, attended group.

## 2015-04-24 NOTE — Progress Notes (Signed)
Patient ID: Grace [name], female   DOB: 05/15/1985, 30 y.o.   MRN: [id number] D: Client visible on unit, has visitors this evening, reports visit was nice. Client reports "mood up and down" "depression "6" of 10. Client reports "being in here has helped" having SI, but using coping skill i.e coloring to help alleviate thoughts. Client contracts for safety. Client reports anger issues "that's why I don't talk a lot, people might thing I'm mean or being hateful, so I just don't talk"   Client reports "anger towards my child father"  A: Clinical research associateWriter provided emotional support listening, also encouraged client to continue groups and use coping skills. Medications reviewed, administered as ordered. R "client is safe on the unit, attended group.

## 2015-04-25 MED ORDER — METOPROLOL TARTRATE 25 MG PO TABS: 12.5000 mg | ORAL_TABLET | Freq: Two times a day (BID) | ORAL | Status: DC

## 2015-04-25 MED ORDER — HYDROXYZINE HCL 25 MG PO TABS: 25.0000 mg | ORAL_TABLET | Freq: Four times a day (QID) | ORAL | Status: DC | PRN

## 2015-04-25 MED ORDER — BUPROPION HCL ER (XL) 300 MG PO TB24: 300.0000 mg | ORAL_TABLET | Freq: Every day | ORAL | Status: DC

## 2015-04-25 MED ORDER — QUETIAPINE FUMARATE 25 MG PO TABS: 25.0000 mg | ORAL_TABLET | Freq: Three times a day (TID) | ORAL | Status: DC

## 2015-04-25 MED ORDER — METHIMAZOLE 10 MG PO TABS: 20.0000 mg | ORAL_TABLET | Freq: Two times a day (BID) | ORAL | Status: DC

## 2015-04-25 MED ORDER — QUETIAPINE FUMARATE 200 MG PO TABS: 200.0000 mg | ORAL_TABLET | Freq: Every day | ORAL | Status: DC

## 2015-04-25 MED ORDER — CITALOPRAM HYDROBROMIDE 40 MG PO TABS: 40.0000 mg | ORAL_TABLET | Freq: Every day | ORAL | Status: DC

## 2015-04-25 NOTE — Tx Team (Signed)
Interdisciplinary Treatment Plan Update (Adult) Date: 04/25/2015   Date: 04/25/2015 1:23 PM  Progress in Treatment:  Attending groups: Yes  Participating in groups: No  Taking medication as prescribed: Yes  Tolerating medication: Yes  Family/Significant othe contact made: No, Pt declines Patient understands diagnosis: Yes AEB seeking help with depression Discussing patient identified problems/goals with staff: Yes  Medical problems stabilized or resolved: Yes  Denies suicidal/homicidal ideation: Yes Patient has not harmed self or Others: Yes   New problem(s) identified: None identified at this time.   Discharge Plan or Barriers: Pt will return to her sister's house and follow-up with [facility].  Additional comments:  Patient and CSW reviewed pt's identified goals and treatment plan. Patient verbalized understanding and agreed to treatment plan. CSW reviewed [facility] "Discharge Process and Patient Involvement" Form. Pt verbalized understanding of information provided and signed form.   Reason for Continuation of Hospitalization:  Depression Medication stabilization Suicidal ideation  Estimated length of stay: 0 days; Pt stable for DC  Review of initial/current patient goals per problem list:   1.  Goal(s): Patient will participate in aftercare plan  Met:  Yes  Target date: 3-5 days from date of admission   As evidenced by: Patient will participate within aftercare plan AEB aftercare provider and housing plan at discharge being identified.   04/17/15: Pt will DC home to sister's house and follow-up with [facility].  2.  Goal (s): Patient will exhibit decreased depressive symptoms and suicidal ideations.  Met:  Yes  Target date: 3-5 days from date of admission   As evidenced by: Patient will utilize self rating of depression at 3 or below and demonstrate decreased signs of depression or be deemed stable for discharge by MD.  04/17/15:  Pt rates depression at 9/10; endorses passive SI  04/22/15: Pt continues to rate depression at a high rate, 10/10; endorses SI on the unit but contracts for safety.  04/25/15: Pt rates depression at 0/10 and denies SI and HI Attendees:  Patient:    Family:    Physician: Dr. Sabra Heck, MD  04/25/2015 1:23 PM  Nursing:  04/25/2015 1:23 PM  Clinical Social Worker Peri Maris, [place] 04/25/2015 1:23 PM  Other: ,  04/25/2015 1:23 PM  Clinical:  Bryson Dames, RN 04/25/2015 1:23 PM  Other: , RN Charge Nurse 04/25/2015 1:23 PM  Other:     Peri Maris, [place] Work [phone]
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
## 2015-04-25 NOTE — Discharge Summary (Signed)
Physician Discharge Summary Note  Patient:  Grace [name] is an 30 y.o., female MRN:  [id number] DOB:  1986/01/06 Patient phone:  [phone] (home)  Patient address:   [address] [place] [address],  Total Time spent with patient: 45 minutes  Date of Admission:  04/15/2015 Date of Discharge: 04/25/2015  Reason for Admission:  Depression  Principal Problem: MDD (major depressive disorder), recurrent episode, severe [facility]) Discharge Diagnoses: Patient Active Problem List   Diagnosis Date Noted  . 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 MDD (major depressive disorder), recurrent episode, severe (HCC) [F33.2] 04/15/2015  . Hyperthyroidism [E05.90] 03/21/2015  . Tachycardia [R00.0] 03/21/2015  . Palpitations [R00.2] 03/21/2015  . Chest pain [R07.9] 03/21/2015  . MDD (major depressive disorder), recurrent episode, moderate (HCC) [F33.1] 03/16/2015  . Consultation for female sterilization [Z30.09] 05/09/2013  . NSVD (normal spontaneous vaginal delivery) [O80] 03/30/2013  . Chronic hypertension in pregnancy [O10.919] 03/29/2013  . HTN in pregnancy, chronic [O10.919] 03/28/2013  . Transient hypertension of pregnancy, antepartum [O13.9] 03/20/2013  . GERD (gastroesophageal reflux disease) [K21.9] 01/18/2013  . Hyperemesis gravidarum before end of [redacted] week gestation with electrolyte imbalance [O21.1] 09/23/2012  . Trichomonas [A59.9] 09/23/2012  . Supervision of high-risk pregnancy [O09.90] 04/19/2011  . History of molar pregnancy, antepartum [O09.A0] 03/22/2011  . Hyperthyroidism, antepartum [O13.086, E07.9] 03/16/2011  . Sickle cell trait (HCC) [D57.3] 12/30/2010    Past Psychiatric History:  See above  Past Medical History:  Past Medical History  Diagnosis Date  . Sickle cell trait (HCC)   . Hyperthyroidism   . Sickle cell trait (HCC)   . Cholecystitis 08/18/2011  . SVD (spontaneous vaginal delivery)     x 3  [facility]  . Hypertension   . History of recurrent UTIs     recurrent UTIs  . Headache(784.0)     otc med prn  . Joint pain     knees and shoulders - otc med prn    Past Surgical History  Procedure Laterality Date  . Cholecystectomy  08/19/2011    Procedure: LAPAROSCOPIC CHOLECYSTECTOMY WITH INTRAOPERATIVE CHOLANGIOGRAM;  Surgeon: Lodema Pilot, DO;  Location: [facility];  Service: General;  Laterality: N/A;  . Dilation and curettage of uterus      molar preg  . Laparoscopic bilateral salpingectomy Bilateral 05/09/2013    Procedure: LAPAROSCOPIC BILATERAL SALPINGECTOMY;  Surgeon: Allie Bossier, MD;  Location: [facility];  Service: Gynecology;  Laterality: Bilateral;   Family History:  Family History  Problem Relation Age of Onset  . Diabetes Mother   . Cancer Father   . Diabetes Maternal Uncle   . Cancer Maternal Grandmother   . Cancer Maternal Grandfather   . Hypertension Maternal Grandfather   . Diabetes Maternal Grandfather   . Sickle cell anemia Son   . Asthma Son   . Heart disease Paternal Grandfather   . Hearing loss Paternal Aunt    Family Psychiatric History:  Denies Social History:  History  Alcohol Use No     History  Drug Use No    Social History   Social History  . Marital Status: Single    Spouse Name: N/A  . Number of Children: N/A  . Years of Education: N/A   Social History Main Topics  . Smoking status: Never Smoker   . Smokeless tobacco: Never Used  . Alcohol Use: No  . Drug Use: No  . Sexual Activity: Yes    Birth Control/ Protection:  Surgical   Other Topics Concern  . None   Social History Narrative   2 Adults, 4 children at home   FOB involved    Hospital Course:  Grace [name] was admitted for MDD (major depressive disorder), recurrent episode, severe (HCC) and crisis management.  She was treated with the following medications as listed below.  Grace [name] was discharged with current medication and was instructed on how to take medications  as prescribed; (details listed below under Medication List).  Medical problems were identified and treated as needed.  Home medications were restarted as appropriate.  Improvement was monitored by observation and Grace [name] daily report of symptom reduction.  Emotional and mental status was monitored by daily self-inventory reports completed by Grace [name] and clinical staff.         Grace [name] was evaluated by the treatment team for stability and plans for continued recovery upon discharge.  Grace [name] motivation was an integral factor for scheduling further treatment.  Employment, transportation, bed availability, health status, family support, and any pending legal issues were also considered during her hospital stay.  She was offered further treatment options upon discharge including but not limited to Residential, Intensive Outpatient, and Outpatient treatment.  Grace [name] will follow up with the services as listed below under Follow Up Information.     Upon completion of this admission the Grace [name] was both mentally and medically stable for discharge denying suicidal/homicidal ideation, auditory/visual/tactile hallucinations, delusional thoughts and paranoia.     Physical Findings: AIMS: Facial and Oral Movements Muscles of Facial Expression: None, normal Lips and Perioral Area: None, normal Jaw: None, normal Tongue: None, normal,Extremity Movements Upper (arms, wrists, hands, fingers): None, normal Lower (legs, knees, ankles, toes): None, normal, Trunk Movements Neck, shoulders, hips: None, normal, Overall Severity Severity of abnormal movements (highest score from questions above): None, normal Incapacitation due to abnormal movements: None, normal Patient's awareness of abnormal movements (rate only patient's report): No Awareness, Dental Status Current problems with teeth and/or dentures?: No Does patient usually wear dentures?: No   CIWA:  CIWA-Ar Total: 1 COWS:  COWS Total Score: 2  Musculoskeletal: Strength & Muscle Tone: within normal limits Gait & Station: normal Patient leans: N/A  Psychiatric Specialty Exam:  See MD SRA Review of Systems  All other systems reviewed and are negative.   Blood pressure 105/66, pulse 89, temperature 98.3 F (36.8 C), temperature source Oral, resp. rate 20, height 5' 4.25" (1.632 m), weight 67.132 kg (148 lb), last menstrual period 03/20/2015, SpO2 100 %.Body mass index is 25.21 kg/(m^2).  Have you used any form of tobacco in the last 30 days? (Cigarettes, Smokeless Tobacco, Cigars, and/or Pipes): No  Has this patient used any form of tobacco in the last 30 days? (Cigarettes, Smokeless Tobacco, Cigars, and/or Pipes) Yes, N/A  Metabolic Disorder Labs:  Lab Results  Component Value Date   HGBA1C 5.4 04/17/2015   MPG 108 04/17/2015   No results found for: PROLACTIN Lab Results  Component Value Date   CHOL 112 04/17/2015   TRIG 110 04/17/2015   HDL 31* 04/17/2015   CHOLHDL 3.6 04/17/2015   VLDL 22 04/17/2015   LDLCALC 59 04/17/2015    See Psychiatric Specialty Exam and Suicide Risk Assessment completed by Attending Physician prior to discharge.  Discharge destination:  Home  Is patient on multiple antipsychotic therapies at discharge:  No   Has Patient had three or more failed trials of antipsychotic  monotherapy by history:  No  Recommended Plan for Multiple Antipsychotic Therapies: NA     Medication List    STOP taking these medications        ibuprofen 200 MG tablet  Commonly known as:  ADVIL,MOTRIN     pantoprazole 40 MG tablet  Commonly known as:  PROTONIX      TAKE these medications      Indication   buPROPion 300 MG 24 hr tablet  Commonly known as:  WELLBUTRIN XL  Take 1 tablet (300 mg total) by mouth daily.   Indication:  Major Depressive Disorder     citalopram 40 MG tablet  Commonly known as:  CELEXA  Take 1 tablet (40 mg total) by mouth  daily.   Indication:  Depression     hydrOXYzine 25 MG tablet  Commonly known as:  ATARAX/VISTARIL  Take 1 tablet (25 mg total) by mouth every 6 (six) hours as needed for anxiety.   Indication:  Anxiety Neurosis, Tension, Anxiety     methimazole 10 MG tablet  Commonly known as:  TAPAZOLE  Take 2 tablets (20 mg total) by mouth 2 (two) times daily. For hyperthyroidim   Indication:  Overactive Thyroid Gland     metoprolol tartrate 25 MG tablet  Commonly known as:  LOPRESSOR  Take 0.5 tablets (12.5 mg total) by mouth 2 (two) times daily. For high blood pressure   Indication:  High Blood Pressure     QUEtiapine 200 MG tablet  Commonly known as:  SEROQUEL  Take 1 tablet (200 mg total) by mouth at bedtime.   Indication:  Mood control     QUEtiapine 25 MG tablet  Commonly known as:  SEROQUEL  Take 1 tablet (25 mg total) by mouth 3 (three) times daily.   Indication:  Agitation       Follow-up Information    Follow up with [facility].   Specialty:  Professional Counselor   Why:  Please walk in between 8am-12pm Monday-Friday to be assessed for therapy and medication management.   Contact information:   [address] [place] [address] [phone]       Follow-up recommendations:  Activity:  as tol Diet:  as tol  Comments:  1.  Take all your medications as prescribed.              2.  Report any adverse side effects to outpatient provider.                       3.  Patient instructed to not use alcohol or illegal drugs while on prescription medicines.            4.  In the event of worsening symptoms, instructed patient to call 911, the crisis hotline or go to nearest emergency room for evaluation of symptoms.  Signed: Velna Hatchet May Agustin AGNP-BC 04/25/2015, 1:28 PM  I personally assessed the patient and formulated the plan Madie Reno A. Dub Mikes, M.D.

## 2015-04-25 NOTE — Progress Notes (Signed)
D) Pt being discharged to home. Mood and affect are appropriate. Pt denies SI and HI. Pt rates her depression at as a 0, hopelessness as a 0 and her anxiety at a 4.  A) All discharge plans explained to Pt. All personal belongings returned to Pt. Pt given support, reassurance and praise. Discharge medications  explained to Pt R) Pt denies SI and HI delusions and hallucinations.  Denies SI and HI

## 2015-04-25 NOTE — Progress Notes (Signed)
  [facility] Dis[facility] :  Will you be returning to the same living situation after discharge:  Yes,  pt returning to sister's home At discharge, do you have transportation home?: Yes,  sister to provide transportation Do you have the ability to pay for your medications: Yes,  Pt provided with 7-day supply and prescriptions  Release of information consent forms completed and in the chart;  Patient's signature needed at discharge.  Patient to Follow up at: Follow-up Information    Follow up with [facility].   Specialty:  Professional Counselor   Why:  Please walk in between 8am-12pm Monday-Friday to be assessed for therapy and medication management.   Contact information:   [address] [place] [id number] [phone]       Next level of care provider [facility] Link:no  Safety Planning and Suicide Prevention discussed: Yes,  with Pt; declines family contact  Have you used any form of tobacco in the last 30 days? (Cigarettes, Smokeless Tobacco, Cigars, and/or Pipes): No  Has patient been referred to the Quitline?: N/A patient is not a smoker  Patient has been referred for addiction treatment: N/A  Elaina HoopsCarter, Argie Lober M 04/25/2015, 1:24 PM

## 2015-06-19 ENCOUNTER — Ambulatory Visit: Payer: Self-pay | Admitting: Family Medicine

## 2015-09-19 ENCOUNTER — Emergency Department (HOSPITAL_COMMUNITY)
Admission: EM | Admit: 2015-09-19 | Discharge: 2015-09-19 | Disposition: A | Discharge status: Home / Self Care | Payer: Medicaid Other | Attending: Emergency Medicine | Admitting: Emergency Medicine

## 2015-09-19 ENCOUNTER — Encounter (HOSPITAL_COMMUNITY): Payer: Self-pay | Admitting: Emergency Medicine

## 2015-09-19 DIAGNOSIS — H9203 Otalgia, bilateral: Secondary | ICD-10-CM | POA: Diagnosis not present

## 2015-09-19 DIAGNOSIS — I10 Essential (primary) hypertension: Secondary | ICD-10-CM | POA: Insufficient documentation

## 2015-09-19 DIAGNOSIS — J029 Acute pharyngitis, unspecified: Secondary | ICD-10-CM | POA: Insufficient documentation

## 2015-09-19 DIAGNOSIS — Z79899 Other long term (current) drug therapy: Secondary | ICD-10-CM | POA: Diagnosis not present

## 2015-09-19 LAB — RAPID STREP SCREEN (MED CTR MEBANE ONLY): Streptococcus, Group A Screen (Direct): NEGATIVE

## 2015-09-19 MED ORDER — PSEUDOEPHEDRINE HCL 60 MG PO TABS: 60.0000 mg | ORAL_TABLET | ORAL | Status: DC | PRN

## 2015-09-19 MED ORDER — KETOROLAC TROMETHAMINE 60 MG/2ML IM SOLN
60.0000 mg | Freq: Once | INTRAMUSCULAR | Status: AC
  Administered 2015-09-19: 60 mg via INTRAMUSCULAR
  Filled 2015-09-19: qty 2

## 2015-09-19 MED ORDER — NAPROXEN 500 MG PO TABS: 500.0000 mg | ORAL_TABLET | Freq: Two times a day (BID) | ORAL | Status: DC

## 2015-09-19 NOTE — ED Provider Notes (Signed)
CSN: [id number]     Arrival date & time 09/19/15  1324 History  By signing my name below, I, [name], attest that this documentation has been prepared under the direction and in the presence of Renne Crigler, PA-C. Electronically Signed: [name], ED Scribe. 09/19/2015. 9:29 AM.      Chief Complaint  Patient presents with  . Sore Throat  . Otalgia    The history is provided by the patient. No language interpreter was used.   HPI Comments: Grace [name] is a 30 y.o. female who presents to the Emergency Department complaining of intermittent sore throat onset 2 weeks prior. Pt states that the sore throat has become more constant this week. She reports she is having associated HA and ear pain described as a fullness. Pt states she has tried aleve and salt water gargles with no relief. Pt denies fever, rhinorrhea, congestion, neck pain, neck stiffness or cough.    Past Medical History  Diagnosis Date  . Sickle cell trait (HCC)   . Hyperthyroidism   . Sickle cell trait (HCC)   . Cholecystitis 08/18/2011  . SVD (spontaneous vaginal delivery)     x 3 [facility]  . Hypertension   . History of recurrent UTIs     recurrent UTIs  . Headache(784.0)     otc med prn  . Joint pain     knees and shoulders - otc med prn   Past Surgical History  Procedure Laterality Date  . Cholecystectomy  08/19/2011    Procedure: LAPAROSCOPIC CHOLECYSTECTOMY WITH INTRAOPERATIVE CHOLANGIOGRAM;  Surgeon: Lodema Pilot, DO;  Location: [facility] OR;  Service: General;  Laterality: N/A;  . Dilation and curettage of uterus      molar preg  . Laparoscopic bilateral salpingectomy Bilateral 05/09/2013    Procedure: LAPAROSCOPIC BILATERAL SALPINGECTOMY;  Surgeon: Allie Bossier, MD;  Location: [facility] ORS;  Service: Gynecology;  Laterality: Bilateral;   Family History  Problem Relation Age of Onset  . Diabetes Mother   . Cancer Father   . Diabetes Maternal Uncle   . Cancer Maternal Grandmother   . Cancer Maternal  Grandfather   . Hypertension Maternal Grandfather   . Diabetes Maternal Grandfather   . Sickle cell anemia Son   . Asthma Son   . Heart disease Paternal Grandfather   . Hearing loss Paternal Aunt    Social History  Substance Use Topics  . Smoking status: Never Smoker   . Smokeless tobacco: Never Used  . Alcohol Use: No   OB History    Gravida Para Term Preterm AB TAB SAB Ectopic Multiple Living   Review of Systems  Constitutional: Negative for fever, chills and fatigue.  HENT: Positive for ear pain and sore throat. Negative for congestion, rhinorrhea and sinus pressure.   Eyes: Negative for redness.  Respiratory: Negative for cough and wheezing.   Gastrointestinal: Negative for nausea, vomiting, abdominal pain and diarrhea.  Genitourinary: Negative for dysuria.  Musculoskeletal: Negative for myalgias, neck pain and neck stiffness.  Skin: Negative for rash.  Neurological: Positive for headaches.  Hematological: Negative for adenopathy.     Allergies  Review of patient's allergies indicates no known allergies.  Home Medications   Prior to Admission medications   Medication Sig Start Date End Date Taking? Authorizing Provider  buPROPion (WELLBUTRIN XL) 300 MG 24 hr tablet Take 1 tablet (300 mg total) by mouth daily. 04/25/15  Adonis BrookSheila Agustin, NP  citalopram (CELEXA) 40 MG tablet Take 1 tablet (40 mg total) by mouth daily. 04/25/15   Adonis BrookSheila Agustin, NP  hydrOXYzine (ATARAX/VISTARIL) 25 MG tablet Take 1 tablet (25 mg total) by mouth every 6 (six) hours as needed for anxiety. 04/25/15   Adonis BrookSheila Agustin, NP  methimazole (TAPAZOLE) 10 MG tablet Take 2 tablets (20 mg total) by mouth 2 (two) times daily. For hyperthyroidim 04/25/15   Adonis BrookSheila Agustin, NP  metoprolol tartrate (LOPRESSOR) 25 MG tablet Take 0.5 tablets (12.5 mg total) by mouth 2 (two) times daily. For high blood pressure 04/25/15   Adonis BrookSheila Agustin, NP  QUEtiapine (SEROQUEL) 200 MG tablet Take 1 tablet (200  mg total) by mouth at bedtime. 04/25/15   Adonis BrookSheila Agustin, NP  QUEtiapine (SEROQUEL) 25 MG tablet Take 1 tablet (25 mg total) by mouth 3 (three) times daily. 04/25/15   Adonis BrookSheila Agustin, NP   BP 140/85 mmHg  Pulse 116  Temp(Src) 98.6 F (37 C) (Oral)  Resp 16  SpO2 99%  LMP 08/31/2015   Physical Exam  Constitutional: She appears well-developed and well-nourished. No distress.  HENT:  Head: Normocephalic and atraumatic.  Right Ear: Tympanic membrane, external ear and ear canal normal.  Left Ear: Tympanic membrane, external ear and ear canal normal.  Nose: No mucosal edema or rhinorrhea.  Mouth/Throat: Mucous membranes are normal. No oral lesions. No trismus in the jaw. Oropharyngeal exudate, posterior oropharyngeal edema and posterior oropharyngeal erythema present. No tonsillar abscesses.  Eyes: Conjunctivae and EOM are normal.  Neck: Neck supple. No tracheal deviation present.  Cardiovascular: Normal rate.   Pulmonary/Chest: Effort normal. No respiratory distress.  Musculoskeletal: Normal range of motion.  Neurological: She is alert.  Skin: Skin is warm and dry.  Psychiatric: She has a normal mood and affect. Her behavior is normal.  Nursing note and vitals reviewed.   ED Course  Procedures (including critical care time) DIAGNOSTIC STUDIES: Oxygen Saturation is 99% on RA, normal by my interpretation.    COORDINATION OF CARE: 9:15 AM-Discussed treatment plan with pt at bedside and pt agreed to plan. Toradol ordered.     Labs Review Labs Reviewed  RAPID STREP SCREEN (NOT AT [facility])  CULTURE, GROUP A STREP [facility])   Vital signs reviewed and are as follows: Filed Vitals:   09/19/15 0902  BP: 140/85  Pulse: 116  Temp: 98.6 F (37 C)  Resp: 16   9:42 AM Patient counseled on supportive care for viral URI and s/s to return including worsening symptoms, persistent fever, persistent vomiting, or if they have any other concerns. Urged to see PCP if symptoms persist for more than 3  days. Patient verbalizes understanding and agrees with plan.     MDM   Final diagnoses:  Pharyngitis  Ear pain, bilateral   Pharyngitis: Strep negative. No indications for and about except this time. No peritonsillar abscess. The suspicion for epiglottitis or deep space infection in neck. Low suspicion for esophagitis. Patient is not febrile and does not have other systemic symptoms of illness.  Ear pain: Possibly eustachian tube dysfunction from recent illness. No otitis media.  I personally performed the services described in this documentation, which was scribed in my presence. The recorded information has been reviewed and is accurate.     Renne CriglerJoshua Rosemae Mcquown, PA-C 09/19/15 [id number]  Derwood KaplanAnkit Nanavati, MD 09/19/15 [phone]

## 2015-09-19 NOTE — ED Notes (Signed)
Started 1 week ago with sore throat. Started bilateral ear pain 3-4 days ago.

## 2015-09-19 NOTE — Discharge Instructions (Signed)
Please read and follow all provided instructions.  Your diagnoses today include:  1. Pharyngitis   2. Ear pain, bilateral     Tests performed today include:  Strep test: was negative for strep throat  Strep culture: you will be notified if this comes back positive  Vital signs. See below for your results today.   Medications prescribed:   Naproxen - anti-inflammatory pain medication  Do not exceed 500mg  naproxen every 12 hours, take with food  You have been prescribed an anti-inflammatory medication or NSAID. Take with food. Take smallest effective dose for the shortest duration needed for your pain. Stop taking if you experience stomach pain or vomiting.    Pseudoephedrine - decongestant medication to help with nasal congestion  Home care instructions:  Please read the educational materials provided and follow any instructions contained in this packet.  Follow-up instructions: Please follow-up with your primary care provider as needed for further evaluation of your symptoms.  Return instructions:   Please return to the Emergency Department if you experience worsening symptoms.   Return if you have worsening problems swallowing, your neck becomes swollen, you cannot swallow your saliva or your voice becomes muffled.   Return with high persistent fever, persistent vomiting, or if you have trouble breathing.   Please return if you have any other emergent concerns.  Additional Information:  Your vital signs today were: BP 140/85 mmHg   Pulse 116   Temp(Src) 98.6 F (37 C) (Oral)   Resp 16   SpO2 99%   LMP 08/31/2015 If your blood pressure (BP) was elevated above 135/85 this visit, please have this repeated by your doctor within one month. --------------

## 2015-09-21 LAB — CULTURE, GROUP A STREP (THRC)

## 2016-01-05 ENCOUNTER — Inpatient Hospital Stay (HOSPITAL_COMMUNITY)
Admission: AD | Admit: 2016-01-05 | Discharge: 2016-01-16 | DRG: 885 | Disposition: A | Discharge status: Home / Self Care | Payer: Medicaid Other | Source: Intra-hospital | Attending: Psychiatry | Admitting: Psychiatry

## 2016-01-05 ENCOUNTER — Encounter (HOSPITAL_COMMUNITY): Payer: Self-pay | Admitting: Emergency Medicine

## 2016-01-05 ENCOUNTER — Encounter (HOSPITAL_COMMUNITY): Payer: Self-pay

## 2016-01-05 ENCOUNTER — Emergency Department (HOSPITAL_COMMUNITY)
Admission: EM | Admit: 2016-01-05 | Discharge: 2016-01-05 | Disposition: A | Discharge status: Home / Self Care | Payer: Medicaid Other | Attending: Emergency Medicine | Admitting: Emergency Medicine

## 2016-01-05 DIAGNOSIS — Z9889 Other specified postprocedural states: Secondary | ICD-10-CM

## 2016-01-05 DIAGNOSIS — I10 Essential (primary) hypertension: Secondary | ICD-10-CM | POA: Diagnosis present

## 2016-01-05 DIAGNOSIS — F4010 Social phobia, unspecified: Secondary | ICD-10-CM | POA: Diagnosis present

## 2016-01-05 DIAGNOSIS — R45851 Suicidal ideations: Secondary | ICD-10-CM | POA: Diagnosis present

## 2016-01-05 DIAGNOSIS — Z599 Problem related to housing and economic circumstances, unspecified: Secondary | ICD-10-CM

## 2016-01-05 DIAGNOSIS — Z833 Family history of diabetes mellitus: Secondary | ICD-10-CM

## 2016-01-05 DIAGNOSIS — Z87440 Personal history of urinary (tract) infections: Secondary | ICD-10-CM | POA: Diagnosis not present

## 2016-01-05 DIAGNOSIS — Z825 Family history of asthma and other chronic lower respiratory diseases: Secondary | ICD-10-CM | POA: Diagnosis not present

## 2016-01-05 DIAGNOSIS — Z809 Family history of malignant neoplasm, unspecified: Secondary | ICD-10-CM

## 2016-01-05 DIAGNOSIS — F322 Major depressive disorder, single episode, severe without psychotic features: Secondary | ICD-10-CM | POA: Diagnosis not present

## 2016-01-05 DIAGNOSIS — D573 Sickle-cell trait: Secondary | ICD-10-CM | POA: Diagnosis present

## 2016-01-05 DIAGNOSIS — Z915 Personal history of self-harm: Secondary | ICD-10-CM

## 2016-01-05 DIAGNOSIS — Z8249 Family history of ischemic heart disease and other diseases of the circulatory system: Secondary | ICD-10-CM

## 2016-01-05 DIAGNOSIS — Z9049 Acquired absence of other specified parts of digestive tract: Secondary | ICD-10-CM | POA: Diagnosis not present

## 2016-01-05 DIAGNOSIS — Z808 Family history of malignant neoplasm of other organs or systems: Secondary | ICD-10-CM | POA: Diagnosis not present

## 2016-01-05 DIAGNOSIS — F332 Major depressive disorder, recurrent severe without psychotic features: Secondary | ICD-10-CM | POA: Insufficient documentation

## 2016-01-05 DIAGNOSIS — Z79899 Other long term (current) drug therapy: Secondary | ICD-10-CM | POA: Insufficient documentation

## 2016-01-05 DIAGNOSIS — F333 Major depressive disorder, recurrent, severe with psychotic symptoms: Secondary | ICD-10-CM | POA: Diagnosis present

## 2016-01-05 DIAGNOSIS — F329 Major depressive disorder, single episode, unspecified: Secondary | ICD-10-CM

## 2016-01-05 DIAGNOSIS — F32A Depression, unspecified: Secondary | ICD-10-CM

## 2016-01-05 LAB — CBC
HEMATOCRIT: 37.1 % (ref 36.0–46.0)
HEMOGLOBIN: 13.5 g/dL (ref 12.0–15.0)
MCH: 31.3 pg (ref 26.0–34.0)
MCHC: 36.4 g/dL — ABNORMAL HIGH (ref 30.0–36.0)
MCV: 86.1 fL (ref 78.0–100.0)
Platelets: 341 10*3/uL (ref 150–400)
RBC: 4.31 MIL/uL (ref 3.87–5.11)
RDW: 12.6 % (ref 11.5–15.5)
WBC: 6.9 10*3/uL (ref 4.0–10.5)

## 2016-01-05 LAB — RAPID URINE DRUG SCREEN, HOSP PERFORMED
AMPHETAMINES: NOT DETECTED
BARBITURATES: NOT DETECTED
Benzodiazepines: NOT DETECTED
COCAINE: NOT DETECTED
OPIATES: NOT DETECTED
TETRAHYDROCANNABINOL: POSITIVE — AB

## 2016-01-05 LAB — COMPREHENSIVE METABOLIC PANEL
ALBUMIN: 4.5 g/dL (ref 3.5–5.0)
ALK PHOS: 41 U/L (ref 38–126)
ALT: 15 U/L (ref 14–54)
AST: 21 U/L (ref 15–41)
Anion gap: 8 (ref 5–15)
BUN: 8 mg/dL (ref 6–20)
CALCIUM: 9.4 mg/dL (ref 8.9–10.3)
CHLORIDE: 107 mmol/L (ref 101–111)
CO2: 25 mmol/L (ref 22–32)
CREATININE: 0.72 mg/dL (ref 0.44–1.00)
GFR calc non Af Amer: >60 mL/min (ref >60)
GLUCOSE: 101 mg/dL — AB (ref 65–99)
Potassium: 3.9 mmol/L (ref 3.5–5.1)
SODIUM: 140 mmol/L (ref 135–145)
Total Bilirubin: 1.3 mg/dL — ABNORMAL HIGH (ref 0.3–1.2)
Total Protein: 8.2 g/dL — ABNORMAL HIGH (ref 6.5–8.1)

## 2016-01-05 LAB — ETHANOL: Alcohol, Ethyl (B): <5 mg/dL (ref <5)

## 2016-01-05 LAB — TSH: TSH: 0.495 u[IU]/mL (ref 0.350–4.500)

## 2016-01-05 LAB — SALICYLATE LEVEL

## 2016-01-05 LAB — ACETAMINOPHEN LEVEL: Acetaminophen (Tylenol), Serum: <10 ug/mL — ABNORMAL LOW (ref 10–30)

## 2016-01-05 MED ORDER — CITALOPRAM HYDROBROMIDE 10 MG PO TABS
10.0000 mg | ORAL_TABLET | Freq: Every day | ORAL | Status: DC
  Administered 2016-01-06 – 2016-01-08 (×3): 10 mg via ORAL
  Filled 2016-01-05 (×5): qty 1

## 2016-01-05 MED ORDER — MAGNESIUM HYDROXIDE 400 MG/5ML PO SUSP
30.0000 mL | Freq: Every day | ORAL | Status: DC | PRN
  Administered 2016-01-11: 30 mL via ORAL
  Filled 2016-01-05: qty 30

## 2016-01-05 MED ORDER — METHIMAZOLE 10 MG PO TABS
20.0000 mg | ORAL_TABLET | Freq: Two times a day (BID) | ORAL | Status: DC
  Administered 2016-01-06 – 2016-01-16 (×21): 20 mg via ORAL
  Filled 2016-01-05 (×25): qty 2

## 2016-01-05 MED ORDER — METHIMAZOLE 10 MG PO TABS
20.0000 mg | ORAL_TABLET | Freq: Two times a day (BID) | ORAL | Status: DC
  Filled 2016-01-05: qty 2

## 2016-01-05 MED ORDER — QUETIAPINE FUMARATE 100 MG PO TABS
100.0000 mg | ORAL_TABLET | Freq: Every day | ORAL | Status: DC
Start: 2016-01-05 — End: 2016-01-05

## 2016-01-05 MED ORDER — HYDROXYZINE HCL 25 MG PO TABS
25.0000 mg | ORAL_TABLET | Freq: Four times a day (QID) | ORAL | Status: DC | PRN
  Administered 2016-01-06 (×2): 25 mg via ORAL
  Filled 2016-01-05 (×2): qty 1

## 2016-01-05 MED ORDER — ALUM & MAG HYDROXIDE-SIMETH 200-200-20 MG/5ML PO SUSP
30.0000 mL | ORAL | Status: DC | PRN
  Administered 2016-01-07 – 2016-01-14 (×4): 30 mL via ORAL
  Filled 2016-01-05 (×4): qty 30

## 2016-01-05 MED ORDER — METOPROLOL TARTRATE 25 MG PO TABS: 12.5000 mg | ORAL_TABLET | Freq: Two times a day (BID) | ORAL | Status: DC

## 2016-01-05 MED ORDER — CITALOPRAM HYDROBROMIDE 10 MG PO TABS: 10.0000 mg | ORAL_TABLET | Freq: Every day | ORAL | Status: DC

## 2016-01-05 MED ORDER — METOPROLOL TARTRATE 12.5 MG HALF TABLET
12.5000 mg | ORAL_TABLET | Freq: Two times a day (BID) | ORAL | Status: DC
  Administered 2016-01-06 – 2016-01-16 (×19): 12.5 mg via ORAL
  Filled 2016-01-05 (×25): qty 1

## 2016-01-05 MED ORDER — QUETIAPINE FUMARATE 100 MG PO TABS
100.0000 mg | ORAL_TABLET | Freq: Every day | ORAL | Status: DC
  Administered 2016-01-05 – 2016-01-06 (×2): 100 mg via ORAL
  Filled 2016-01-05 (×4): qty 1

## 2016-01-05 MED ORDER — HYDROXYZINE HCL 25 MG PO TABS: 25.0000 mg | ORAL_TABLET | Freq: Four times a day (QID) | ORAL | Status: DC | PRN

## 2016-01-05 MED ORDER — ACETAMINOPHEN 325 MG PO TABS
650.0000 mg | ORAL_TABLET | Freq: Four times a day (QID) | ORAL | Status: DC | PRN
  Administered 2016-01-10 – 2016-01-13 (×3): 650 mg via ORAL
  Filled 2016-01-05 (×3): qty 2

## 2016-01-05 NOTE — BH Assessment (Signed)
[facility] Assessment Progress Note  [facility]Lind[facility] Voluntary paperwork has been signed and faxed over.

## 2016-01-05 NOTE — ED Notes (Signed)
Bed: [facility]4 Expected date:  Expected time:  Means of arrival:  Comments: 

## 2016-01-05 NOTE — Tx Team (Signed)
Initial Treatment Plan 01/05/2016 9:32 PM Grace [name] [id number]    PATIENT STRESSORS: Financial difficulties Medication change or noncompliance Adoptive mother passed away last month   PATIENT STRENGTHS: Wellsite geologistCommunication skills General fund of knowledge Motivation for treatment/growth   PATIENT IDENTIFIED PROBLEMS: Depression  Anxiety   Suicidal ideation  "Learning new ways to cope"  "Sleep better and no suicidal thoughts"             DISCHARGE CRITERIA:  Improved stabilization in mood, thinking, and/or behavior Verbal commitment to aftercare and medication compliance  PRELIMINARY DISCHARGE PLAN: Outpatient therapy Medication management  PATIENT/FAMILY INVOLVEMENT: This treatment plan has been presented to and reviewed with the patient, Grace [name].  The patient and family have been given the opportunity to ask questions and make suggestions.  Levin BaconHeather V Aarian Griffie, RN 01/05/2016, 9:32 PM

## 2016-01-05 NOTE — Progress Notes (Signed)
01/05/16 1408:  Pt was sitting up in bed with her head on the bed side table.  LRT asked pt if she was interested in doing activities.  Pt refused.   Grace RancherMarjette Beckett Maden, LRT/CTRS

## 2016-01-05 NOTE — ED Triage Notes (Signed)
Pt c/o SI and insomnia x 1 week. Pt sts there was an instigating event but will not elaborate for this RN. Pt denies having a specific plan to kill herself. Pt has hx of SI. Pt c/o intermittent HI. Pt has hx of depression. A&Ox4 and ambulatory.
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
## 2016-01-05 NOTE — ED Provider Notes (Signed)
[facility]-EMERGENCY DEPT Provider Note   CSN: [id number] Arrival date & time: 01/05/16  1117     History   Chief Complaint Chief Complaint  Patient presents with  . Suicidal    HPI Grace [name] is a 30 y.o. female with history of MDD with psychotic features who presents with suicidal ideations. Patient reports that she has been feeling progressively depressed over the past 1-2 months after her mother passed away at the beginning of last month. Patient reports that he has been getting a lot worse over the past few days. She has had suicidal ideations. Patient states "I want to go to sleep and not wake up." She was brought in by her sister and patient states, "if she did not bring me here, I would've done something." Patient does not specify what she would have done. Patient has cut herself in the past, drank until she passed out, and used sleeping pills for past suicide attempts. Patient reports insomnia and no appetite over the past few days. Patient endorses auditory hallucinations of people saying her name a few times per week. Patient uses alcohol and marijuana occasionally. Patient states that she does not use alcohol regularly, but when she has access to it when she is feeling this way, she will drink in excess. Patient denies any pain at this time. She denies any fevers, chest pain, shortness of breath, abdominal pain, nausea, vomiting, urinary symptoms. Patient has a history of hyperthyroidism and she has not been taking her medications because she has been out of them. Patient denies any homicidal ideations or visual hallucinations.  HPI  Past Medical History:  Diagnosis Date  . Cholecystitis 08/18/2011  . Headache(784.0)    otc med prn  . History of recurrent UTIs    recurrent UTIs  . Hypertension   . Hyperthyroidism   . Joint pain    knees and shoulders - otc med prn  . Sickle cell trait (HCC)   . Sickle cell trait (HCC)   . SVD (spontaneous vaginal delivery)    x 3 [facility]     Patient Active Problem List   Diagnosis Date Noted  . Severe episode of recurrent major depressive disorder, with psychotic features (HCC)   . MDD (major depressive disorder), recurrent episode, severe (HCC) 04/15/2015  . Hyperthyroidism 03/21/2015  . Tachycardia 03/21/2015  . Palpitations 03/21/2015  . Chest pain 03/21/2015  . MDD (major depressive disorder), recurrent episode, moderate (HCC) 03/16/2015  . Consultation for female sterilization 05/09/2013  . NSVD (normal spontaneous vaginal delivery) 03/30/2013  . Chronic hypertension in pregnancy 03/29/2013  . HTN in pregnancy, chronic 03/28/2013  . Transient hypertension of pregnancy, antepartum 03/20/2013  . GERD (gastroesophageal reflux disease) 01/18/2013  . Hyperemesis gravidarum before end of [redacted] week gestation with electrolyte imbalance 09/23/2012  . Trichomonas 09/23/2012  . Supervision of high-risk pregnancy 04/19/2011  . History of molar pregnancy, antepartum 03/22/2011  . Hyperthyroidism, antepartum 03/16/2011  . Sickle cell trait (HCC) 12/30/2010    Past Surgical History:  Procedure Laterality Date  . CHOLECYSTECTOMY  08/19/2011   Procedure: LAPAROSCOPIC CHOLECYSTECTOMY WITH INTRAOPERATIVE CHOLANGIOGRAM;  Surgeon: Lodema PilotBrian Layton, DO;  Location: [facility] OR;  Service: General;  Laterality: N/A;  . DILATION AND CURETTAGE OF UTERUS     molar preg  . LAPAROSCOPIC BILATERAL SALPINGECTOMY Bilateral 05/09/2013   Procedure: LAPAROSCOPIC BILATERAL SALPINGECTOMY;  Surgeon: Allie BossierMyra C Dove, MD;  Location: [facility] ORS;  Service: Gynecology;  Laterality: Bilateral;    OB History    Gravida Para Term  Preterm AB Living   4 3 3   1 3    SAB TAB Ectopic Multiple Live Births   1       3       Home Medications    Prior to Admission medications   Medication Sig Start Date End Date Taking? Authorizing Provider  buPROPion (WELLBUTRIN XL) 300 MG 24 hr tablet Take 1 tablet (300 mg total) by mouth daily. Patient not taking: Reported on 01/05/2016  04/25/15   Adonis Brook, NP  citalopram (CELEXA) 40 MG tablet Take 1 tablet (40 mg total) by mouth daily. Patient not taking: Reported on 01/05/2016 04/25/15   Adonis Brook, NP  hydrOXYzine (ATARAX/VISTARIL) 25 MG tablet Take 1 tablet (25 mg total) by mouth every 6 (six) hours as needed for anxiety. Patient not taking: Reported on 01/05/2016 04/25/15   Adonis Brook, NP  methimazole (TAPAZOLE) 10 MG tablet Take 2 tablets (20 mg total) by mouth 2 (two) times daily. For hyperthyroidim Patient not taking: Reported on 01/05/2016 04/25/15   Adonis Brook, NP  metoprolol tartrate (LOPRESSOR) 25 MG tablet Take 0.5 tablets (12.5 mg total) by mouth 2 (two) times daily. For high blood pressure Patient not taking: Reported on 01/05/2016 04/25/15   Adonis Brook, NP  naproxen (NAPROSYN) 500 MG tablet Take 1 tablet (500 mg total) by mouth 2 (two) times daily. Patient not taking: Reported on 01/05/2016 09/19/15   Renne Crigler, PA-C  pseudoephedrine (SUDAFED) 60 MG tablet Take 1 tablet (60 mg total) by mouth every 4 (four) hours as needed for congestion. Patient not taking: Reported on 01/05/2016 09/19/15   Renne Crigler, PA-C  QUEtiapine (SEROQUEL) 200 MG tablet Take 1 tablet (200 mg total) by mouth at bedtime. Patient not taking: Reported on 01/05/2016 04/25/15   Adonis Brook, NP  QUEtiapine (SEROQUEL) 25 MG tablet Take 1 tablet (25 mg total) by mouth 3 (three) times daily. Patient not taking: Reported on 01/05/2016 04/25/15   Adonis Brook, NP    Family History Family History  Problem Relation Age of Onset  . Diabetes Mother   . Cancer Father   . Diabetes Maternal Uncle   . Cancer Maternal Grandmother   . Cancer Maternal Grandfather   . Hypertension Maternal Grandfather   . Diabetes Maternal Grandfather   . Sickle cell anemia Son   . Asthma Son   . Heart disease Paternal Grandfather   . Hearing loss Paternal Aunt     Social History Social History  Substance Use Topics  . Smoking status: Never Smoker  .  Smokeless tobacco: Never Used  . Alcohol use No     Allergies   Review of patient's allergies indicates no known allergies.   Review of Systems Review of Systems  Constitutional: Negative for chills and fever.  HENT: Negative for facial swelling and sore throat.   Respiratory: Negative for shortness of breath.   Cardiovascular: Negative for chest pain.  Gastrointestinal: Negative for abdominal pain, nausea and vomiting.  Genitourinary: Negative for dysuria.  Musculoskeletal: Negative for back pain.  Skin: Negative for rash and wound.  Neurological: Negative for headaches.  Psychiatric/Behavioral: Positive for sleep disturbance and suicidal ideas. The patient is not nervous/anxious.      Physical Exam Updated Vital Signs BP 113/69   Pulse 97   Temp 98.1 F (36.7 C) (Oral)   Resp 16   SpO2 100%   Physical Exam  Constitutional: She appears well-developed and well-nourished. No distress.  HENT:  Head: Normocephalic and atraumatic.  Mouth/Throat: Oropharynx  is clear and moist. No oropharyngeal exudate.  Eyes: Conjunctivae are normal. Pupils are equal, round, and reactive to light. Right eye exhibits no discharge. Left eye exhibits no discharge. No scleral icterus.  Neck: Normal range of motion. Neck supple. No thyromegaly present.  Cardiovascular: Normal rate, regular rhythm, normal heart sounds and intact distal pulses.  Exam reveals no gallop and no friction rub.   No murmur heard. Pulmonary/Chest: Effort normal and breath sounds normal. No stridor. No respiratory distress. She has no wheezes. She has no rales.  Abdominal: Soft. Bowel sounds are normal. She exhibits no distension. There is no tenderness. There is no rebound and no guarding.  Musculoskeletal: She exhibits no edema.  Lymphadenopathy:    She has no cervical adenopathy.  Neurological: She is alert. Coordination normal.  Skin: Skin is warm and dry. No rash noted. She is not diaphoretic. No pallor.    Psychiatric: She exhibits a depressed mood. She expresses suicidal ideation. She expresses no homicidal ideation. She expresses no homicidal plans.  Nursing note and vitals reviewed.    ED Treatments / Results  Labs (all labs ordered are listed, but only abnormal results are displayed) Labs Reviewed  COMPREHENSIVE METABOLIC PANEL - Abnormal; Notable for the following:       Result Value   Glucose, Bld 101 (*)    Total Protein 8.2 (*)    Total Bilirubin 1.3 (*)    All other components within normal limits  ACETAMINOPHEN LEVEL - Abnormal; Notable for the following:    Acetaminophen (Tylenol), Serum <10 (*)    All other components within normal limits  CBC - Abnormal; Notable for the following:    MCHC 36.4 (*)    All other components within normal limits  ETHANOL  SALICYLATE LEVEL  TSH  URINE RAPID DRUG SCREEN, HOSP PERFORMED    EKG  EKG Interpretation None       Radiology No results found.  Procedures Procedures (including critical care time)  Medications Ordered in ED Medications  citalopram (CELEXA) tablet 10 mg (10 mg Oral Not Given 01/05/16 1619)  QUEtiapine (SEROQUEL) tablet 100 mg (not administered)  methimazole (TAPAZOLE) tablet 20 mg (20 mg Oral Not Given 01/05/16 1619)  metoprolol tartrate (LOPRESSOR) tablet 12.5 mg (12.5 mg Oral Not Given 01/05/16 1620)  hydrOXYzine (ATARAX/VISTARIL) tablet 25 mg (not administered)     Initial Impression / Assessment and Plan / ED Course  I have reviewed the triage vital signs and the nursing notes.  Pertinent labs & imaging results that were available during my care of the patient were reviewed by me and considered in my medical decision making (see chart for details).  Clinical Course    Patient with suicidal ideation and auditory hallucinations. CBC unremarkable. CMP shows glucose 101, protein 8.2, total bili 1.3. TSH WNL. Ethanol, salicylate, acetaminophen level negative. UDS pending. Patient is medically cleared.  TTS pending. Patient vitals stable throughout ED course.  Final Clinical Impressions(s) / ED Diagnoses   Final diagnoses:  Severe episode of recurrent major depressive disorder, without psychotic features (HCC)  Depression  Suicidal ideation    New Prescriptions New Prescriptions   No medications on file     Emi Holes, PA-C 01/05/16 1628    Shaune Pollack, MD 01/06/16 [phone]

## 2016-01-05 NOTE — ED Notes (Signed)
Pt has been wanded by security belongings collected and changed out.

## 2016-01-05 NOTE — ED Notes (Signed)
Patient in bed awake during this assessment. Mood and affects flat and depressed. Patient endorsed passive SI but contracted for safety. Writer encouraged and supported patient. Patient denied pain. Q 15 minute check continues for safety.

## 2016-01-05 NOTE — Progress Notes (Signed)
Grace [name] is a 30 year old female being admitted voluntarily to [facility] from [facility].  She came to the ED with returning depression and suicidal ideation.  She reported that her adoptive mother passed away last month and her depression has been getting worse since then.  She stated that after her last hospitalization in January 2017, she stopped taking her medication because she didn't feel like she needed them.  She reported that she was doing well since then until her mother passed.  She has been having suicidal thoughts, depression, crying spells, poor energy, anhedonia and nervousness.  She denies HI or A/V hallucinations.  She continues to report passive suicidal thoughts with no plan or intent.  She has history of hyperthyroidism, headaches and hypertension.  She is diagnosed with Major Depressive Disorder, recurrent with psychotic features.  Oriented her to the unit.  Admission paperwork completed and signed.  Belongings searched and secured in locker # 14.  Skin assessment completed and noted tattoo on left forearm and upper right back.  Q 15 minute checks initiated for safety.  We will monitor the progress towards her goals.

## 2016-01-05 NOTE — BH Assessment (Signed)
[facility] Assessment Progress Note Accepted to [facility] per Steffanie RainwaterLindsey, AC. TTS will fax paperwork.

## 2016-01-05 NOTE — ED Notes (Signed)
Patient pleasant on approach, she is very soft spoken and appears sad.  States she lost her mother a little more than a month ago and has not been able to move forward.  States they were very close.  Also states she was on Celexa, Wellbutrin and Seroquel in the past with good effect, but has been off of it for several months.

## 2016-01-06 DIAGNOSIS — F322 Major depressive disorder, single episode, severe without psychotic features: Principal | ICD-10-CM

## 2016-01-06 LAB — PREGNANCY, URINE: Preg Test, Ur: NEGATIVE

## 2016-01-06 MED ORDER — BUPROPION HCL ER (XL) 150 MG PO TB24
150.0000 mg | ORAL_TABLET | Freq: Every day | ORAL | Status: DC
  Administered 2016-01-07 – 2016-01-16 (×10): 150 mg via ORAL
  Filled 2016-01-06 (×13): qty 1

## 2016-01-06 NOTE — [facility] Group Notes (Signed)
The focus of this group is to educate the patient on the purpose and policies of crisis stabilization and provide a format to answer questions about their admission.  The group details unit policies and expectations of patients while admitted. Patient did not attend the 0900 morning group. 

## 2016-01-06 NOTE — [facility] Group Notes (Signed)
[facility] LCSW Group Therapy 01/06/2016 1:15 PM  Type of Therapy: Group Therapy- Feelings about Diagnosis  Participation Level: Minimal  Participation Quality:  Attentive  Affect:  Flat  Cognitive: Alert and Oriented   Insight:  Unable to Assess  Engagement in Therapy: Developing/Improving and Engaged   Modes of Intervention: Clarification, Confrontation, Discussion, Education, Exploration, Limit-setting, Orientation, Problem-solving, Rapport Building, Dance movement psychotherapisteality Testing, Socialization and Support  Description of Group:   This group will allow patients to explore their thoughts and feelings about diagnoses they have received. Patients will be guided to explore their level of understanding and acceptance of these diagnoses. Facilitator will encourage patients to process their thoughts and feelings about the reactions of others to their diagnosis, and will guide patients in identifying ways to discuss their diagnosis with significant others in their lives. This group will be process-oriented, with patients participating in exploration of their own experiences as well as giving and receiving support and challenge from other group members.  Summary of Progress/Problems:  Pt did not participate in group discussion but remained attentive throughout.   Therapeutic Modalities:   Cognitive Behavioral Therapy Solution Focused Therapy Motivational Interviewing Relapse Prevention Therapy  Chad CordialLauren Carter, LCSWA 01/06/2016 4:23 PM
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
## 2016-01-06 NOTE — Progress Notes (Signed)
D: Patient denies SI/HI and A/V hallucinations; patient reports sleep is poor; reports appetite is poor; reports energy level is low ; reports ability to concentrate is poor; rates depression as 10/10; rates hopelessness 9/10; rates anxiety as 10/10;   A: Monitored q 15 minutes; patient encouraged to attend groups; patient educated about medications; patient given medications per physician orders; patient encouraged to express feelings and/or concerns  R: Patient is anxious but cooperative; patient is assertive; patient engages ans attended some groups after being minimal throughout the first part of the day; patient was able to set goal to talk with staff 1:1 when having feelings of SI; patient is taking medications as prescribed and tolerating medications

## 2016-01-06 NOTE — H&P (Signed)
Psychiatric Admission Assessment Adult  Patient Identification: Grace [name] MRN:  [id number] Date of Evaluation:  01/06/2016 Chief Complaint:  MDD RECURRENT WITH PSYCHOTIC FEATURES Principal Diagnosis: Major depressive disorder, single episode, severe without psychotic features ([place]) Diagnosis:   Patient Active Problem List   Diagnosis Date Noted  . Major depressive disorder, single episode, severe without psychotic features ([place]) [F32.2] 01/05/2016    Priority: High  . Severe episode of recurrent major depressive disorder, with psychotic features ([place]) [F33.3]   . MDD (major depressive disorder), recurrent episode, severe ([place]) [F33.2] 04/15/2015  . Hyperthyroidism [E05.90] 03/21/2015  . Tachycardia [R00.0] 03/21/2015  . Palpitations [R00.2] 03/21/2015  . Chest pain [R07.9] 03/21/2015  . MDD (major depressive disorder), recurrent episode, moderate ([place]) [F33.1] 03/16/2015  . Consultation for female sterilization [N02.72] 05/09/2013  . NSVD (normal spontaneous vaginal delivery) [O80] 03/30/2013  . Chronic hypertension in pregnancy [O10.919] 03/29/2013  . HTN in pregnancy, chronic [O10.919] 03/28/2013  . Transient hypertension of pregnancy, antepartum [O13.9] 03/20/2013  . GERD (gastroesophageal reflux disease) [K21.9] 01/18/2013  . Hyperemesis gravidarum before end of [redacted] week gestation with electrolyte imbalance [O21.1] 09/23/2012  . Trichomonas [A59.9] 09/23/2012  . Supervision of high-risk pregnancy [O09.90] 04/19/2011  . History of molar pregnancy, antepartum [O09.A0] 03/22/2011  . Hyperthyroidism, antepartum [Z36.644, E07.9] 03/16/2011  . Sickle cell trait ([place]) [D57.3] 12/30/2010   History of Present Illness:  Grace [name], a 30 y.o. female with history of depression.  She was previously admitted to [facility] Jan 2017 and was discharged on Celexa , low dose Seroquel,and Wellbutrin. This time, she presents voluntarily accompanied by reporting symptoms of depression and  suicidal ideation that have worsened since the death of her mom 1 mo ago. She states she attempted to "drink herself to death" this weekend with liquor that her sister had. Pt denies SA problems, but said that her drinking was a suicide attempt.  Pt also has a history of suicide attempts.  Grace [name] denies homicidal ideation/ history of violence. Pt states that she has had some auditory / visual hallucinations (seeing shadows and hearing her name called), but is not responding to internal stimuli.  Pt states current stressors include financial problems that make her unable to take care of her kids, the loss of her mom, having to live with her sister, having difficulty accessing health care.   Associated Signs/Symptoms: Depression Symptoms:  depressed mood, (Hypo) Manic Symptoms:  Labiality of Mood, Anxiety Symptoms:  Excessive Worry, Psychotic Symptoms:  NA PTSD Symptoms: NA Total Time spent with patient: 45 minutes  Past Psychiatric History: see HPI  Is the patient at risk to self? Yes.    Has the patient been a risk to self in the past 6 months? Yes.    Has the patient been a risk to self within the distant past? Yes.    Is the patient a risk to others? No.  Has the patient been a risk to others in the past 6 months? No.  Has the patient been a risk to others within the distant past? No.   Prior Inpatient Therapy:   Prior Outpatient Therapy:    Alcohol Screening: 1. How often do you have a drink containing alcohol?: Never 9. Have you or someone else been injured as a result of your drinking?: No 10. Has a relative or friend or a doctor or another health worker been concerned about your drinking or suggested you cut down?: No Alcohol Use Disorder Identification Test Final Score (AUDIT): 0 Brief Intervention:  AUDIT score less than 7 or less-screening does not suggest unhealthy drinking-brief intervention not indicated Substance Abuse History in the last 12 months:  Yes.   Consequences  of Substance Abuse: NA Previous Psychotropic Medications: Yes  Psychological Evaluations: Yes  Past Medical History:  Past Medical History:  Diagnosis Date  . Cholecystitis 08/18/2011  . Headache(784.0)    otc med prn  . History of recurrent UTIs    recurrent UTIs  . Hypertension   . Hyperthyroidism   . Joint pain    knees and shoulders - otc med prn  . Sickle cell trait ([place])   . Sickle cell trait ([place])   . SVD (spontaneous vaginal delivery)    x 3 [facility]    Past Surgical History:  Procedure Laterality Date  . CHOLECYSTECTOMY  08/19/2011   Procedure: LAPAROSCOPIC CHOLECYSTECTOMY WITH INTRAOPERATIVE CHOLANGIOGRAM;  Surgeon: Madilyn Hook, DO;  Location: [facility];  Service: General;  Laterality: N/A;  . DILATION AND CURETTAGE OF UTERUS     molar preg  . LAPAROSCOPIC BILATERAL SALPINGECTOMY Bilateral 05/09/2013   Procedure: LAPAROSCOPIC BILATERAL SALPINGECTOMY;  Surgeon: Emily Filbert, MD;  Location: [facility];  Service: Gynecology;  Laterality: Bilateral;   Family History:  Family History  Problem Relation Age of Onset  . Diabetes Mother   . Cancer Father   . Diabetes Maternal Uncle   . Cancer Maternal Grandmother   . Cancer Maternal Grandfather   . Hypertension Maternal Grandfather   . Diabetes Maternal Grandfather   . Sickle cell anemia Son   . Asthma Son   . Heart disease Paternal Grandfather   . Hearing loss Paternal Aunt    Family Psychiatric  History: see HPI Tobacco Screening: Have you used any form of tobacco in the last 30 days? (Cigarettes, Smokeless Tobacco, Cigars, and/or Pipes): No Social History:  History  Alcohol Use No     History  Drug Use No    Additional Social History:      Pain Medications: denies Prescriptions: denies Over the Counter: denies History of alcohol / drug use?: No history of alcohol / drug abuse    Allergies:  No Known Allergies Lab Results:  Results for orders placed or performed during the hospital encounter of 01/05/16 (from the  past 48 hour(s))  Rapid urine drug screen (hospital performed)     Status: Abnormal   Collection Time: 01/05/16 11:26 AM  Result Value Ref Range   Opiates NONE DETECTED NONE DETECTED   Cocaine NONE DETECTED NONE DETECTED   Benzodiazepines NONE DETECTED NONE DETECTED   Amphetamines NONE DETECTED NONE DETECTED   Tetrahydrocannabinol POSITIVE (A) NONE DETECTED   Barbiturates NONE DETECTED NONE DETECTED    Comment:        DRUG SCREEN FOR MEDICAL PURPOSES ONLY.  IF CONFIRMATION IS NEEDED FOR ANY PURPOSE, NOTIFY LAB WITHIN 5 DAYS.        LOWEST DETECTABLE LIMITS FOR URINE DRUG SCREEN Drug Class       Cutoff (ng/mL) Amphetamine      1000 Barbiturate      200 Benzodiazepine   947 Tricyclics       654 Opiates          300 Cocaine          300 THC              50   Comprehensive metabolic panel     Status: Abnormal   Collection Time: 01/05/16 11:39 AM  Result Value Ref Range  Sodium 140 135 - 145 mmol/L   Potassium 3.9 3.5 - 5.1 mmol/L   Chloride 107 101 - 111 mmol/L   CO2 25 22 - 32 mmol/L   Glucose, Bld 101 (H) 65 - 99 mg/dL   BUN 8 6 - 20 mg/dL   Creatinine, Ser 7.11 0.44 - 1.00 mg/dL   Calcium 9.4 8.9 - 56.5 mg/dL   Total Protein 8.2 (H) 6.5 - 8.1 g/dL   Albumin 4.5 3.5 - 5.0 g/dL   AST 21 15 - 41 U/L   ALT 15 14 - 54 U/L   Alkaline Phosphatase 41 38 - 126 U/L   Total Bilirubin 1.3 (H) 0.3 - 1.2 mg/dL   GFR calc non Af Amer >60 >60 mL/min   GFR calc Af Amer >60 >60 mL/min    Comment: (NOTE) The eGFR has been calculated using the CKD EPI equation. This calculation has not been validated in all clinical situations. eGFR's persistently <60 mL/min signify possible Chronic Kidney Disease.    Anion gap 8 5 - 15  Ethanol     Status: None   Collection Time: 01/05/16 11:39 AM  Result Value Ref Range   Alcohol, Ethyl (B) <5 <5 mg/dL    Comment:        LOWEST DETECTABLE LIMIT FOR SERUM ALCOHOL IS 5 mg/dL FOR MEDICAL PURPOSES ONLY   Salicylate level     Status: None    Collection Time: 01/05/16 11:39 AM  Result Value Ref Range   Salicylate Lvl <4.0 2.8 - 30.0 mg/dL  Acetaminophen level     Status: Abnormal   Collection Time: 01/05/16 11:39 AM  Result Value Ref Range   Acetaminophen (Tylenol), Serum <10 (L) 10 - 30 ug/mL    Comment:        THERAPEUTIC CONCENTRATIONS VARY SIGNIFICANTLY. A RANGE OF 10-30 ug/mL MAY BE AN EFFECTIVE CONCENTRATION FOR MANY PATIENTS. HOWEVER, SOME ARE BEST TREATED AT CONCENTRATIONS OUTSIDE THIS RANGE. ACETAMINOPHEN CONCENTRATIONS >150 ug/mL AT 4 HOURS AFTER INGESTION AND >50 ug/mL AT 12 HOURS AFTER INGESTION ARE OFTEN ASSOCIATED WITH TOXIC REACTIONS.   cbc     Status: Abnormal   Collection Time: 01/05/16 11:39 AM  Result Value Ref Range   WBC 6.9 4.0 - 10.5 K/uL   RBC 4.31 3.87 - 5.11 MIL/uL   Hemoglobin 13.5 12.0 - 15.0 g/dL   HCT 96.4 43.6 - 67.2 %   MCV 86.1 78.0 - 100.0 fL   MCH 31.3 26.0 - 34.0 pg   MCHC 36.4 (H) 30.0 - 36.0 g/dL   RDW 86.2 60.0 - 48.7 %   Platelets 341 150 - 400 K/uL  TSH     Status: None   Collection Time: 01/05/16 11:39 AM  Result Value Ref Range   TSH 0.495 0.350 - 4.500 uIU/mL    Blood Alcohol level:  Lab Results  Component Value Date   ETH <5 01/05/2016   ETH <5 04/15/2015    Metabolic Disorder Labs:  Lab Results  Component Value Date   HGBA1C 5.4 04/17/2015   MPG 108 04/17/2015   No results found for: PROLACTIN Lab Results  Component Value Date   CHOL 112 04/17/2015   TRIG 110 04/17/2015   HDL 31 (L) 04/17/2015   CHOLHDL 3.6 04/17/2015   VLDL 22 04/17/2015   LDLCALC 59 04/17/2015    Current Medications: Current Facility-Administered Medications  Medication Dose Route Frequency Provider Last Rate Last Dose  . acetaminophen (TYLENOL) tablet 650 mg  650 mg Oral Q6H PRN  Charm Rings, NP      . alum & mag hydroxide-simeth (MAALOX/MYLANTA) 200-200-20 MG/5ML suspension 30 mL  30 mL Oral Q4H PRN Charm Rings, NP      . Melene Muller ON 01/07/2016] buPROPion (WELLBUTRIN  XL) 24 hr tablet 150 mg  150 mg Oral Daily Rockey Situ Cobos, MD      . citalopram (CELEXA) tablet 10 mg  10 mg Oral Daily Charm Rings, NP   10 mg at 01/06/16 1384  . hydrOXYzine (ATARAX/VISTARIL) tablet 25 mg  25 mg Oral Q6H PRN Charm Rings, NP   25 mg at 01/06/16 1106  . magnesium hydroxide (MILK OF MAGNESIA) suspension 30 mL  30 mL Oral Daily PRN Charm Rings, NP      . methimazole (TAPAZOLE) tablet 20 mg  20 mg Oral BID Charm Rings, NP   20 mg at 01/06/16 0823  . metoprolol tartrate (LOPRESSOR) tablet 12.5 mg  12.5 mg Oral BID Charm Rings, NP   12.5 mg at 01/06/16 6263  . QUEtiapine (SEROQUEL) tablet 100 mg  100 mg Oral QHS Charm Rings, NP   100 mg at 01/05/16 2213   PTA Medications: Prescriptions Prior to Admission  Medication Sig Dispense Refill Last Dose  . buPROPion (WELLBUTRIN XL) 300 MG 24 hr tablet Take 1 tablet (300 mg total) by mouth daily. (Patient not taking: Reported on 01/06/2016) 30 tablet 0 Not Taking at Unknown time  . citalopram (CELEXA) 40 MG tablet Take 1 tablet (40 mg total) by mouth daily. (Patient not taking: Reported on 01/06/2016) 30 tablet 0 Not Taking at Unknown time  . hydrOXYzine (ATARAX/VISTARIL) 25 MG tablet Take 1 tablet (25 mg total) by mouth every 6 (six) hours as needed for anxiety. (Patient not taking: Reported on 01/06/2016) 30 tablet 0 Not Taking at Unknown time  . methimazole (TAPAZOLE) 10 MG tablet Take 2 tablets (20 mg total) by mouth 2 (two) times daily. For hyperthyroidim (Patient not taking: Reported on 01/06/2016) 120 tablet 3 Not Taking at Unknown time  . metoprolol tartrate (LOPRESSOR) 25 MG tablet Take 0.5 tablets (12.5 mg total) by mouth 2 (two) times daily. For high blood pressure (Patient not taking: Reported on 01/06/2016) 30 tablet 0 Not Taking at Unknown time  . naproxen (NAPROSYN) 500 MG tablet Take 1 tablet (500 mg total) by mouth 2 (two) times daily. (Patient not taking: Reported on 01/06/2016) 20 tablet 0 Not Taking at Unknown  time  . pseudoephedrine (SUDAFED) 60 MG tablet Take 1 tablet (60 mg total) by mouth every 4 (four) hours as needed for congestion. (Patient not taking: Reported on 01/06/2016) 15 tablet 0 Not Taking at Unknown time  . QUEtiapine (SEROQUEL) 200 MG tablet Take 1 tablet (200 mg total) by mouth at bedtime. (Patient not taking: Reported on 01/06/2016) 30 tablet 0 Not Taking at Unknown time  . QUEtiapine (SEROQUEL) 25 MG tablet Take 1 tablet (25 mg total) by mouth 3 (three) times daily. (Patient not taking: Reported on 01/06/2016) 90 tablet 0 Not Taking at Unknown time    Musculoskeletal: Strength & Muscle Tone: within normal limits Gait & Station: normal Patient leans: N/A  Psychiatric Specialty Exam: Physical Exam  ROS  Blood pressure 107/78, pulse 100, temperature 98.7 F (37.1 C), temperature source Oral, resp. rate 18, height 5\' 5"  (1.651 m), weight 73.5 kg (162 lb), last menstrual period 12/19/2015, SpO2 100 %.Body mass index is 26.96 kg/m.  General Appearance: Fairly Groomed  Eye Contact:  Fair  Speech:  Normal Rate  Volume:  Normal  Mood:  Depressed  Affect:  Constricted  Thought Process:  Linear  Orientation:  Full (Time, Place, and Person)  Thought Content:  Rumination  Suicidal Thoughts:  No  Homicidal Thoughts:  No  Memory:  Immediate;   Fair Recent;   Fair Remote;   Fair  Judgement:  Fair  Insight:  Fair  Psychomotor Activity:  Normal  Concentration:  Concentration: Good and Attention Span: Poor  Recall:  Poor  Fund of Knowledge:  Poor  Language:  Fair  Akathisia:  NA  Handed:  Right  AIMS (if indicated):     Assets:  Communication Skills Resilience  ADL's:  Intact  Cognition:  WNL  Sleep:  Number of Hours: 6    Treatment Plan Summary: Daily contact with patient to assess and evaluate symptoms and progress in treatment and Medication management  Admit for crisis management and mood stabilization. Medication management to re-stabilize current mood symptoms Group  counseling sessions for coping skills Medical consults as needed Review and reinstate any pertinent home medications for other health problems   Observation Level/Precautions:  15 minute checks  Laboratory:  CBC per ED  Psychotherapy:  group  Medications:  restart Wellbutrin XL, Celexa , Seroquel, and Tapazole .   Consultations:  As needed  Discharge Concerns:  safety  Estimated LOS: 2-7 days  Other:     Physician Treatment Plan for Primary Diagnosis: Major depressive disorder, single episode, severe without psychotic features ([place]) Long Term Goal(s): Improvement in symptoms so as ready for discharge  Short Term Goals: Ability to identify changes in lifestyle to reduce recurrence of condition will improve, Ability to verbalize feelings will improve, Ability to disclose and discuss suicidal ideas, Ability to demonstrate self-control will improve, Ability to identify and develop effective coping behaviors will improve, Ability to maintain clinical measurements within normal limits will improve, Compliance with prescribed medications will improve and Ability to identify triggers associated with substance abuse/mental health issues will improve  Physician Treatment Plan for Secondary Diagnosis: Principal Problem:   Major depressive disorder, single episode, severe without psychotic features ([place])  Long Term Goal(s): Improvement in symptoms so as ready for discharge  Short Term Goals: Ability to identify changes in lifestyle to reduce recurrence of condition will improve, Ability to verbalize feelings will improve, Ability to disclose and discuss suicidal ideas, Ability to demonstrate self-control will improve, Ability to identify and develop effective coping behaviors will improve, Ability to maintain clinical measurements within normal limits will improve, Compliance with prescribed medications will improve and Ability to identify triggers associated with substance abuse/mental health issues  will improve  I certify that inpatient services furnished can reasonably be expected to improve the patient's condition.    Janett Labella, NP [facility] 9/19/20172:54 PM   I have reviewed case with NP and have met with patient Agree with NP assessment as above Patient is a 30 year old female- single, has three children, who are currently with her mother, unemployed. She is known to our unit from prior psychiatric admissions . Her last admission was on 12/16 for depression- at that time she was discharged on Celexa , low dose Seroquel,and Wellbutrin  She states " I was doing fine for a while, the medications were working [employer] . States she had stopped her psychiatric medications several weeks ago. States she was doing well until her mother passed away from cancer about one month ago. States that since then she has been " back  in my depression , feeling like I am in a dark place ".  Reports occasional auditory hallucinations, hears her name being called, no psychotic symptoms described or noted today.States she has developed suicidal ideations, and was thinking of trying to kill self by acute alcohol poisoning .  She states she does not normally drink and denies alcohol abuse . Denies drug abuse .  Medical History is remarkable for Hyerthyroidism, currently on Tapazole , which she states she had run out of last month. Dx- MDD , Recurrent , Severe Plan - Inpatient admission- we discussed options- as noted, she states that the medications she was taking in the past were working and well tolerated - restart Wellbutrin XL, Celexa , Seroquel, and Tapazole .

## 2016-01-06 NOTE — Tx Team (Signed)
Interdisciplinary Treatment and Diagnostic Plan Update  01/06/2016 Time of Session: 4:24 PM  Grace [name] MRN: [id number]  Principal Diagnosis: Major depressive disorder, single episode, severe without psychotic features [facility])  Secondary Diagnoses: Principal Problem:   Major depressive disorder, single episode, severe without psychotic features (Grace [name])   Current Medications:  Current Facility-Administered Medications  Medication Dose Route Frequency Provider Last Rate Last Dose  . acetaminophen (TYLENOL) tablet 650 mg  650 mg Oral Q6H PRN [name], NP      . alum & mag hydroxide-simeth (MAALOX/MYLANTA) 200-200-20 MG/5ML suspension 30 mL  30 mL Oral Q4H PRN [name], NP      . Derrill Memo ON 01/07/2016] buPROPion (WELLBUTRIN XL) 24 hr tablet 150 mg  150 mg Oral Daily [name], MD      . citalopram (CELEXA) tablet 10 mg  10 mg Oral Daily [name], NP   10 mg at 01/06/16 6767  . hydrOXYzine (ATARAX/VISTARIL) tablet 25 mg  25 mg Oral Q6H PRN [name], NP   25 mg at 01/06/16 1106  . magnesium hydroxide (MILK OF MAGNESIA) suspension 30 mL  30 mL Oral Daily PRN [name], NP      . methimazole (TAPAZOLE) tablet 20 mg  20 mg Oral BID [name], NP   20 mg at 01/06/16 1623  . metoprolol tartrate (LOPRESSOR) tablet 12.5 mg  12.5 mg Oral BID [name], NP   12.5 mg at 01/06/16 1623  . QUEtiapine (SEROQUEL) tablet 100 mg  100 mg Oral QHS [name], NP   100 mg at 01/05/16 2213    PTA Medications: Prescriptions Prior to Admission  Medication Sig Dispense Refill Last Dose  . buPROPion (WELLBUTRIN XL) 300 MG 24 hr tablet Take 1 tablet (300 mg total) by mouth daily. (Patient not taking: Reported on 01/06/2016) 30 tablet 0 Not Taking at Unknown time  . citalopram (CELEXA) 40 MG tablet Take 1 tablet (40 mg total) by mouth daily. (Patient not taking: Reported on 01/06/2016) 30 tablet 0 Not Taking at Unknown time  . hydrOXYzine (ATARAX/VISTARIL) 25 MG tablet  Take 1 tablet (25 mg total) by mouth every 6 (six) hours as needed for anxiety. (Patient not taking: Reported on 01/06/2016) 30 tablet 0 Not Taking at Unknown time  . methimazole (TAPAZOLE) 10 MG tablet Take 2 tablets (20 mg total) by mouth 2 (two) times daily. For hyperthyroidim (Patient not taking: Reported on 01/06/2016) 120 tablet 3 Not Taking at Unknown time  . metoprolol tartrate (LOPRESSOR) 25 MG tablet Take 0.5 tablets (12.5 mg total) by mouth 2 (two) times daily. For high blood pressure (Patient not taking: Reported on 01/06/2016) 30 tablet 0 Not Taking at Unknown time  . naproxen (NAPROSYN) 500 MG tablet Take 1 tablet (500 mg total) by mouth 2 (two) times daily. (Patient not taking: Reported on 01/06/2016) 20 tablet 0 Not Taking at Unknown time  . pseudoephedrine (SUDAFED) 60 MG tablet Take 1 tablet (60 mg total) by mouth every 4 (four) hours as needed for congestion. (Patient not taking: Reported on 01/06/2016) 15 tablet 0 Not Taking at Unknown time  . QUEtiapine (SEROQUEL) 200 MG tablet Take 1 tablet (200 mg total) by mouth at bedtime. (Patient not taking: Reported on 01/06/2016) 30 tablet 0 Not Taking at Unknown time  . QUEtiapine (SEROQUEL) 25 MG tablet Take 1 tablet (25 mg total) by mouth 3 (three) times daily. (Patient not taking: Reported on 01/06/2016) 90 tablet 0  Not Taking at Unknown time    Treatment Modalities: Medication Management, Group therapy, Case management,  1 to 1 session with clinician, Psychoeducation, Recreational therapy.   Physician Treatment Plan for Primary Diagnosis: Major depressive disorder, single episode, severe without psychotic features (Grace [name]) Long Term Goal(s): Improvement in symptoms so as ready for discharge  Short Term Goals: Ability to identify changes in lifestyle to reduce recurrence of condition will improve, Ability to verbalize feelings will improve, Ability to disclose and discuss suicidal ideas, Ability to demonstrate self-control will improve, Ability  to identify and develop effective coping behaviors will improve, Ability to maintain clinical measurements within normal limits will improve, Compliance with prescribed medications will improve and Ability to identify triggers associated with substance abuse/mental health issues will improve  Medication Management: Evaluate patient's response, side effects, and tolerance of medication regimen.  Therapeutic Interventions: 1 to 1 sessions, Unit Group sessions and Medication administration.  Evaluation of Outcomes: Not Met  Physician Treatment Plan for Secondary Diagnosis: Principal Problem:   Major depressive disorder, single episode, severe without psychotic features (Grace [name])   Long Term Goal(s): Improvement in symptoms so as ready for discharge  Short Term Goals: Ability to identify changes in lifestyle to reduce recurrence of condition will improve, Ability to verbalize feelings will improve, Ability to disclose and discuss suicidal ideas, Ability to demonstrate self-control will improve, Ability to identify and develop effective coping behaviors will improve, Ability to maintain clinical measurements within normal limits will improve, Compliance with prescribed medications will improve and Ability to identify triggers associated with substance abuse/mental health issues will improve  Medication Management: Evaluate patient's response, side effects, and tolerance of medication regimen.  Therapeutic Interventions: 1 to 1 sessions, Unit Group sessions and Medication administration.  Evaluation of Outcomes: Not Met   RN Treatment Plan for Primary Diagnosis: Major depressive disorder, single episode, severe without psychotic features (Grace [name]) Long Term Goal(s): Knowledge of disease and therapeutic regimen to maintain health will improve  Short Term Goals: Ability to verbalize feelings will improve, Ability to disclose and discuss suicidal ideas and Ability to identify and develop effective coping  behaviors will improve  Medication Management: RN will administer medications as ordered by provider, will assess and evaluate patient's response and provide education to patient for prescribed medication. RN will report any adverse and/or side effects to prescribing provider.  Therapeutic Interventions: 1 on 1 counseling sessions, Psychoeducation, Medication administration, Evaluate responses to treatment, Monitor vital signs and CBGs as ordered, Perform/monitor CIWA, COWS, AIMS and Fall Risk screenings as ordered, Perform wound care treatments as ordered.  Evaluation of Outcomes: Not Met   LCSW Treatment Plan for Primary Diagnosis: Major depressive disorder, single episode, severe without psychotic features ([name]) Long Term Goal(s): Safe transition to appropriate next level of care at discharge, Engage patient in therapeutic group addressing interpersonal concerns.  Short Term Goals: Engage patient in aftercare planning with referrals and resources, Increase emotional regulation, Identify triggers associated with mental health/substance abuse issues and Increase skills for wellness and recovery  Therapeutic Interventions: Assess for all discharge needs, 1 to 1 time with Social worker, Explore available resources and support systems, Assess for adequacy in community support network, Educate family and significant other(s) on suicide prevention, Complete Psychosocial Assessment, Interpersonal group therapy.  Evaluation of Outcomes: Not Met   Progress in Treatment: Attending groups: Pt is new to milieu, continuing to assess  Participating in groups: Pt is new to milieu, continuing to assess  Taking medication as prescribed: Yes, MD continues to assess  for medication changes as needed Toleration medication: Yes, no side effects reported at this time Family/Significant other contact made: No, CSW assessing for appropriate contact Patient understands diagnosis: Continuing to assess Discussing  patient identified problems/goals with staff: Yes Medical problems stabilized or resolved: Yes Denies suicidal/homicidal ideation: No, passive SI endorsed Issues/concerns per patient self-inventory: None Other: N/A  New problem(s) identified: None identified at this time.   New Short Term/Long Term Goal(s): None identified at this time.   Discharge Plan or Barriers: Pt will return home and follow-up with outpatient services.   Reason for Continuation of Hospitalization: Depression Hallucinations Medication stabilization Suicidal ideation  Estimated Length of Stay: 3-5 days  Attendees: Patient: 01/06/2016  4:24 PM  Physician: Dr. Parke Poisson 01/06/2016  4:24 PM  Nursing: Anner Crete [facility] 01/06/2016  4:24 PM  RN Care Manager: Lars Pinks, RN 01/06/2016  4:24 PM  Social Worker: Peri Maris, LCSW; Kristin Drinkard, LCSW 01/06/2016  4:24 PM  Recreational Therapist:  01/06/2016  4:24 PM  Other: Lindell Spar, NP; Samuel Jester, NP 01/06/2016  4:24 PM  Other:  01/06/2016  4:24 PM  Other: 01/06/2016  4:24 PM    Scribe for Treatment Team: Bo Mcclintock, LCSW 01/06/2016 4:24 PM

## 2016-01-06 NOTE — [facility] Group Notes (Signed)
Pt attended spiritual care group on grief and loss facilitated by chaplain Burnis KingfisherMatthew Annalee Meyerhoff   Group opened with brief discussion and psycho-social ed around grief and loss in relationships and in relation to self - identifying life patterns, circumstances, changes that cause losses. Established group norm of speaking from own life experience. Group goal of establishing open and affirming space for members to share loss and experience with grief, normalize grief experience and provide psycho social education and grief support.   Grace [name] was present for majority of group.  She stepped out for about 10 minutes and returned.  She did not engage in group discussion.   Belva CromeStalnaker, Laurann Mcmorris [place] MDiv

## 2016-01-06 NOTE — Progress Notes (Signed)
Report received from admitting RN.  Introduced self to pt.  Pt reports passive SI without a plan.  She verbally contracts for safety.  Pt denies HI, denies hallucinations, denies pain.  Medication education provided.  Pt reports she would like to start Tapazole and Lopressor tomorrow morning after breakfast.  Scheduled HS medication administered per order.  Pt reports she will inform staff of needs and concerns.  Pt is safe on the unit.  Will continue to monitor and assess.

## 2016-01-06 NOTE — Progress Notes (Signed)
Adult Psychoeducational Group Note  Date:  01/06/2016 Time:  9:25 PM  Group Topic/Focus:  Wrap-Up Group:   The focus of this group is to help patients review their daily goal of treatment and discuss progress on daily workbooks.   Participation Level:  Active  Participation Quality:  Appropriate  Affect:  Appropriate  Cognitive:  Appropriate  Insight: Appropriate  Engagement in Group:  Engaged  Modes of Intervention:  Discussion  Additional Comments:  Patient goal was to get back on her meds and patient has accomplished this goal. Grace [name], Grace [name] 01/06/2016, 9:25 PM
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
## 2016-01-07 MED ORDER — QUETIAPINE FUMARATE 200 MG PO TABS
200.0000 mg | ORAL_TABLET | Freq: Every day | ORAL | Status: DC
  Administered 2016-01-07 – 2016-01-09 (×3): 200 mg via ORAL
  Filled 2016-01-07 (×5): qty 1

## 2016-01-07 MED ORDER — HYDROXYZINE HCL 50 MG PO TABS
50.0000 mg | ORAL_TABLET | Freq: Three times a day (TID) | ORAL | Status: DC | PRN
  Administered 2016-01-07 – 2016-01-16 (×10): 50 mg via ORAL
  Filled 2016-01-07 (×10): qty 1

## 2016-01-07 NOTE — [facility] Group Notes (Signed)
[facility] LCSW Group Therapy 01/07/2016 1:15 PM  Type of Therapy: Group Therapy- Emotion Regulation  Participation Level: Active   Participation Quality:  Appropriate  Affect: Appropriate  Cognitive: Alert and Oriented   Insight:  Developing/Improving  Engagement in Therapy: Developing/Improving and Engaged   Modes of Intervention: Clarification, Confrontation, Discussion, Education, Exploration, Limit-setting, Orientation, Problem-solving, Rapport Building, Dance movement psychotherapisteality Testing, Socialization and Support  Summary of Progress/Problems: The topic for group today was emotional regulation. This group focused on both positive and negative emotion identification and allowed group members to process ways to identify feelings, regulate negative emotions, and find healthy ways to manage internal/external emotions. Group members were asked to reflect on a time when their reaction to an emotion led to a negative outcome and explored how alternative responses using emotion regulation would have benefited them. Group members were also asked to discuss a time when emotion regulation was utilized when a negative emotion was experienced. Pt was more active in group discussion and identified anger and anxiety as difficult emotions to regulate. Pt identified with participation of others and related to their experiences with emotions that are difficult to regulate.    Grace CordialLauren Carter, LCSWA 01/07/2016 3:54 PM

## 2016-01-07 NOTE — Progress Notes (Addendum)
[facility] MD Progress Note  01/07/2016 12:48 PM [place]  MRN:  [id number] Subjective:  Patient reports ongoing depression, sadness, and a subjective sense of low energy. She does states she feels she is starting to improve compared to how severe her depression was .  Continues to report neuro-vegetative symptoms of depression, mainly poor energy level , poor sleep, and fair appetite . Objective : I have discussed case with treatment team and have met with patient. She continues to report sadness and depression, and her affect still  presents depressed, but somewhat  more reactive  Today.. She does smile at times appropriately. Reports some lingering thoughts of death, dying, but denies any suicidal plan or intention.  Denies medication side effects- had been on this medication regimen in the past which she says she was tolerating well . She feels medications are " starting to help again", but states she feels her current doses remains " too low ".  No disruptive or agitated behaviors on unit, visible in day room, tends to keep to self, minimal interactions with peers at this time.  Principal Problem: Major depressive disorder, single episode, severe without psychotic features ([place]) Diagnosis:   Patient Active Problem List   Diagnosis Date Noted  . Major depressive disorder, single episode, severe without psychotic features ([place]) [F32.2] 01/05/2016  . Severe episode of recurrent major depressive disorder, with psychotic features ([place]) [F33.3]   . MDD (major depressive disorder), recurrent episode, severe ([place]) [F33.2] 04/15/2015  . Hyperthyroidism [E05.90] 03/21/2015  . Tachycardia [R00.0] 03/21/2015  . Palpitations [R00.2] 03/21/2015  . Chest pain [R07.9] 03/21/2015  . MDD (major depressive disorder), recurrent episode, moderate ([place]) [F33.1] 03/16/2015  . Consultation for female sterilization [E17.40] 05/09/2013  . NSVD (normal spontaneous vaginal delivery) [O80] 03/30/2013  . Chronic  hypertension in pregnancy [O10.919] 03/29/2013  . HTN in pregnancy, chronic [O10.919] 03/28/2013  . Transient hypertension of pregnancy, antepartum [O13.9] 03/20/2013  . GERD (gastroesophageal reflux disease) [K21.9] 01/18/2013  . Hyperemesis gravidarum before end of [redacted] week gestation with electrolyte imbalance [O21.1] 09/23/2012  . Trichomonas [A59.9] 09/23/2012  . Supervision of high-risk pregnancy [O09.90] 04/19/2011  . History of molar pregnancy, antepartum [O09.A0] 03/22/2011  . Hyperthyroidism, antepartum [C14.481, E07.9] 03/16/2011  . Sickle cell trait ([place]) [D57.3] 12/30/2010   Total Time spent with patient: 20 minutes   Past Medical History:  Past Medical History:  Diagnosis Date  . Cholecystitis 08/18/2011  . Headache(784.0)    otc med prn  . History of recurrent UTIs    recurrent UTIs  . Hypertension   . Hyperthyroidism   . Joint pain    knees and shoulders - otc med prn  . Sickle cell trait ([place])   . Sickle cell trait ([place])   . SVD (spontaneous vaginal delivery)    x 3 WH    Past Surgical History:  Procedure Laterality Date  . CHOLECYSTECTOMY  08/19/2011   Procedure: LAPAROSCOPIC CHOLECYSTECTOMY WITH INTRAOPERATIVE CHOLANGIOGRAM;  Surgeon: Madilyn Hook, DO;  Location: [facility];  Service: General;  Laterality: N/A;  . DILATION AND CURETTAGE OF UTERUS     molar preg  . LAPAROSCOPIC BILATERAL SALPINGECTOMY Bilateral 05/09/2013   Procedure: LAPAROSCOPIC BILATERAL SALPINGECTOMY;  Surgeon: Emily Filbert, MD;  Location: [facility];  Service: Gynecology;  Laterality: Bilateral;   Family History:  Family History  Problem Relation Age of Onset  . Diabetes Mother   . Cancer Father   . Diabetes Maternal Uncle   . Cancer Maternal Grandmother   .  Cancer Maternal Grandfather   . Hypertension Maternal Grandfather   . Diabetes Maternal Grandfather   . Sickle cell anemia Son   . Asthma Son   . Heart disease Paternal Grandfather   . Hearing loss Paternal Aunt     Social History:   History  Alcohol Use No     History  Drug Use No    Social History   Social History  . Marital status: Single    Spouse name: N/A  . Number of children: N/A  . Years of education: N/A   Social History Main Topics  . Smoking status: Never Smoker  . Smokeless tobacco: Never Used  . Alcohol use No  . Drug use: No  . Sexual activity: Yes    Birth control/ protection: Surgical   Other Topics Concern  . None   Social History Narrative   2 Adults, 4 children at home   FOB involved   Additional Social History:    Pain Medications: denies Prescriptions: denies Over the Counter: denies History of alcohol / drug use?: No history of alcohol / drug abuse  Sleep: Fair  Appetite:  Fair  Current Medications: Current Facility-Administered Medications  Medication Dose Route Frequency Provider Last Rate Last Dose  . acetaminophen (TYLENOL) tablet 650 mg  650 mg Oral Q6H PRN [name], NP      . alum & mag hydroxide-simeth (MAALOX/MYLANTA) 200-200-20 MG/5ML suspension 30 mL  30 mL Oral Q4H PRN [name], NP      . buPROPion (WELLBUTRIN XL) 24 hr tablet 150 mg  150 mg Oral Daily Jenne Campus, MD   150 mg at 01/07/16 0820  . citalopram (CELEXA) tablet 10 mg  10 mg Oral Daily [name], NP   10 mg at 01/07/16 0820  . hydrOXYzine (ATARAX/VISTARIL) tablet 50 mg  50 mg Oral Q8H PRN [name], MD      . magnesium hydroxide (MILK OF MAGNESIA) suspension 30 mL  30 mL Oral Daily PRN [name], NP      . methimazole (TAPAZOLE) tablet 20 mg  20 mg Oral BID [name], NP   20 mg at 01/07/16 0820  . metoprolol tartrate (LOPRESSOR) tablet 12.5 mg  12.5 mg Oral BID [name], NP   12.5 mg at 01/07/16 0820  . QUEtiapine (SEROQUEL) tablet 200 mg  200 mg Oral QHS Jenne Campus, MD        Lab Results:  Results for orders placed or performed during the hospital encounter of 01/05/16 (from the past 48 hour(s))  Pregnancy, urine     Status: None    Collection Time: 01/06/16 11:30 AM  Result Value Ref Range   Preg Test, Ur NEGATIVE NEGATIVE    Comment:        THE SENSITIVITY OF THIS METHODOLOGY IS >20 mIU/mL. Performed at [facility]     Blood Alcohol level:  Lab Results  Component Value Date   [facility] <5 01/05/2016   ETH <5 10/93/2355    Metabolic Disorder Labs: Lab Results  Component Value Date   HGBA1C 5.4 04/17/2015   MPG 108 04/17/2015   No results found for: PROLACTIN Lab Results  Component Value Date   CHOL 112 04/17/2015   TRIG 110 04/17/2015   HDL 31 (L) 04/17/2015   CHOLHDL 3.6 04/17/2015   VLDL 22 04/17/2015   LDLCALC 59 04/17/2015    Physical Findings: AIMS:  , ,  ,  ,  CIWA:    COWS:     Musculoskeletal: Strength & Muscle Tone: within normal limits Gait & Station: normal Patient leans: N/A  Psychiatric Specialty Exam: Physical Exam  ROS does not endorse headache, chest pain or shortness of breath.  Blood pressure 107/68, pulse 91, temperature 98 F (36.7 C), temperature source Oral, resp. rate 20, height '5\' 5"'$  (1.651 m), weight 162 lb (73.5 kg), last menstrual period 12/19/2015, SpO2 100 %.Body mass index is 26.96 kg/m.  General Appearance: Fairly Groomed  Eye Contact:  Fair  Speech:  Normal Rate  Volume:  Decreased  Mood:  reports ongoing depression, presents with partially improved mood   Affect:  Constricted- smiles briefly at times   Thought Process:  Linear  Orientation:  Full (Time, Place, and Person)  Thought Content:  no hallucinations, no delusions , not internally preoccupied   Suicidal Thoughts:  denies suicidal plan or intention, contracts for safety on the unit.   Homicidal Thoughts:  No denies any homicidal or violent ideations   Memory:  recent and remote grossly intact   Judgement:  Other:  improving   Insight:  Fair  Psychomotor Activity:  Normal  Concentration:  Concentration: Good and Attention Span: Good  Recall:  [place] of Knowledge:  Good   Language:  Good  Akathisia:  Negative  Handed:  Right  AIMS (if indicated):     Assets:  Desire for Improvement Social Support  ADL's:  Improved   Cognition:  Intact   Sleep:  Number of Hours: 6.25   Assessment - patient remains depressed, sad, endorses some ongoing neuro-vegetative symptoms,  but does feel optimistic that her medications are starting to help. Describes some passive SI, but able to contract for safety/ no active SI.  Patient is requesting higher doses of Vistaril PRNs, as she feels higher dose more effective to decrease anxiety- no side effects. Treatment Plan Summary: Daily contact with patient to assess and evaluate symptoms and progress in treatment, Medication management, Plan inpatient admission  and medications as below Encourage ongoing group and milieu participation to work on coping skills and symptom reduction Increase Seroquel to 200 mgrs  QHS for mood disorder, depression  Continue Celexa 10 mgrs QDAY for depression  Continue Wellbutrin XL 150 mgrs QAM for depression  Increase Vistaril to 50 mgrs Q 8 hours PRN for anxiety  Order Lipid Panel, HgbA1C, Prolactin as on antipsychotic medication. Treatment team working on disposition planning options   Neita Garnet, MD 01/07/2016, 12:48 PM

## 2016-01-07 NOTE — [facility] Suicide Risk Assessment (Signed)
[facility] INPATIENT:  Family/Significant Other Suicide Prevention Education  Suicide Prevention Education:  Patient Refusal for Family/Significant Other Suicide Prevention Education: The patient Grace [name] has refused to provide written consent for family/significant other to be provided Family/Significant Other Suicide Prevention Education during admission and/or prior to discharge.  Physician notified.  Elaina HoopsLauren M Carter 01/07/2016, 8:59 AM

## 2016-01-07 NOTE — Progress Notes (Signed)
Recreation Therapy Notes  Date: 01/07/16 Time: 0930 Location: 300 [address] Group Room  Group Topic: Stress Management  Goal Area(s) Addresses:  Patient will verbalize importance of using healthy stress management.  Patient will identify positive emotions associated with healthy stress management.   Intervention: Stress Management  Activity :  Karlton LemonStarry Sky Imagery.  LRT introduced the technique of guided imagery to patients.  Patients were to follow along as LRT read script.  LRT read script so patients could participate in the technique.  Education:  Stress Management, Discharge Planning.   Education Outcome: Needs additional education  Clinical Observations/Feedback: Pt did not attend group.   Caroll RancherMarjette Airelle Everding, LRT/CTRS         Caroll RancherLindsay, Azarion Hove A 01/07/2016 12:24 PM

## 2016-01-07 NOTE — Progress Notes (Signed)
D: Patient denies HI and A/V hallucinations; patient reports on and off thoughts of SI; patient reports sleep is poor because woke up throughout the night; reports appetite is poor; reports energy level is low ; reports ability to concentrate is poor; rates depression as 9/10; rates hopelessness 10/10; rates anxiety as 10/10;   A: Monitored q 15 minutes; patient encouraged to attend groups; patient educated about medications; patient given medications per physician orders; patient encouraged to express feelings and/or concerns  R: Patient is cooperative; patient is flat and sad; patient is participating more today; patient's interaction with staff and peers is minimal; patient is anxious from time to time; patient was able to set goal to talk with staff 1:1 when having feelings of SI; patient is taking medications as prescribed and tolerating medications; patient is attending all groups

## 2016-01-07 NOTE — [facility] Group Notes (Signed)
[facility]  01/07/2016 8:45 AM  Participation Quality: Alert, Appropriate and Oriented  Mood/Affect: Flat  Plan for Discharge/Comments: Pt attended discharge planning group and actively participated in group. CSW discussed suicide prevention education with the group and encouraged them to discuss discharge planning and any relevant barriers. Pt is requesting that the MD reassess her medication and increase the dosage.  Transportation Means: Pt reports access to transportation  Supports: No supports mentioned at this time  Chad CordialLauren Carter, LCSWA 01/07/2016 10:03 AM

## 2016-01-07 NOTE — Progress Notes (Signed)
Adult Psychoeducational Group Note  Date:  01/07/2016 Time:  10:06 PM  Group Topic/Focus:  Wrap-Up Group:   The focus of this group is to help patients review their daily goal of treatment and discuss progress on daily workbooks.   Participation Level:  Minimal  Participation Quality:  Appropriate  Affect:  Flat  Cognitive:  Alert  Insight: Appropriate  Engagement in Group:  Engaged  Modes of Intervention:  Discussion  Additional Comments: Pt stated that today was not a good day. She feels that her meds need to be tweeted some more. Her goal for tomorrow is to get out of bed.  Kaleen OdeaCOOKE, Norelle Runnion R 01/07/2016, 10:06 PM

## 2016-01-07 NOTE — Progress Notes (Signed)
Patient ID: Caprice RedChaquita M Frei, female   DOB: 09/01/1985, 30 y.o.   MRN: [id number]    Pt currently presents with a flat affect and cooperative behavior. Pt states "it's been an okay day today." Pt reports good sleep with current medication regimen. Pt reports some mild bloating and gas that occurs post dinner for the past two nights. Pt forwards little to Clinical research associatewriter.   Pt provided with scheduled and as needed medications per providers orders. Pt's labs and vitals were monitored throughout the night. Pt supported emotionally and encouraged to express concerns and questions. Pt educated on medications. Pt reports that Maalox helps with her bloating/gas.   Pt's safety ensured with 15 minute and environmental checks. Pt currently denies SI/HI and A/V hallucinations. Pt verbally agrees to seek staff if SI/HI or A/VH occurs and to consult with staff before acting on any harmful thoughts. Will continue POC.

## 2016-01-07 NOTE — Progress Notes (Signed)
Pt reports her day has been ok.  Pt endorses some passive suicidal thoughts, but can contract for safety.  She denies HI/AVH.  Pt has been observed in the dayroom, withdrawn, and minimal interaction with peers, but later was seen talking with her new roommate that had been admitted earlier in the day.  Med education done with pt.  Support and encouragement offered.  Discharge plans are in process.  Pt plans to return home at discharge.  Pt encouraged to make her needs known to staff.  Safety maintained with q15 minute checks.

## 2016-01-08 MED ORDER — CITALOPRAM HYDROBROMIDE 20 MG PO TABS
20.0000 mg | ORAL_TABLET | Freq: Every day | ORAL | Status: DC
  Administered 2016-01-09 – 2016-01-14 (×6): 20 mg via ORAL
  Filled 2016-01-08 (×8): qty 1

## 2016-01-08 NOTE — Progress Notes (Signed)
Pt attended karaoke group this evening.  

## 2016-01-08 NOTE — [facility] Group Notes (Signed)
[facility] [employer] Group Therapy 01/08/2016 1:15pm  Type of Therapy: [employer] Presentation  Participation Level: Active  Participation Quality: Attentive  Affect: Appropriate  Cognitive: Oriented  Insight: Developing/Improving  Engagement in Therapy: Engaged  Modes of Intervention: Discussion, Education and Socialization  Summary of Progress/Problems: Mental [employer] (MHA) Speaker came to talk about his personal journey with substance abuse and addiction. The pt processed ways by which to relate to the speaker. MHA speaker provided handouts and educational information pertaining to groups and services offered by the MHA. Pt was engaged in speaker's presentation and was receptive to resources provided.    Junior Kenedy Carter, LCSWA 01/08/2016 1:53 PM  

## 2016-01-08 NOTE — Progress Notes (Signed)
Adult Psychoeducational Group Note  Date:  01/08/2016 Time:  0900 am  Group Topic/Focus:  Orientation:   The focus of this group is to educate the patient on the purpose and policies of crisis stabilization and provide a format to answer questions about their admission.  The group details unit policies and expectations of patients while admitted.   Participation Level:  Did Not Attend  Participation Quality:    Affect:    Cognitive:    Insight:   Engagement in Group:    Modes of Intervention:    Additional Comments:    Etienne Millward L 01/08/2016, 4:52 PM

## 2016-01-08 NOTE — Progress Notes (Addendum)
Patient ID: Grace [name], female   DOB: 01/12/1986, 30 y.o.   MRN: [id number]    Pt currently presents with a blunted affect and cooperative behavior. Pt reports to writer that their goal is to "feel better." Pt states "I think I dealt with my moms death a month ago but it's been a month and I still am not handling it." Pt reports good sleep with current medication regimen although she wakes up intermittently.  Pt provided with medications per providers orders. Pt's labs and vitals were monitored throughout the night. Pt supported emotionally and encouraged to express concerns and questions. Pt educated on medications.  Pt's safety ensured with 15 minute and environmental checks. Pt endorses passive SI, no plan, states "just thoughts." Pt currently denies HI. Reports no AVH "today." Pt verbally agrees to seek staff if SI/HI or A/VH occurs and to consult with staff before acting on any harmful thoughts. Will continue POC.

## 2016-01-08 NOTE — Progress Notes (Signed)
D: Pt presents with flat affect and depressed mood. Pt rates depression 10/10. Anxiety 10/10. Pt endorses auditory and visual hallucinations today. Pt endorses suicidal thoughts with no plan or intent. Pt verbally contracts for safety and agrees to no harm behaviors. Pt reported poor sleep last night due to racing thoughts. Pt requesting to have Celexa and Wellbutrin increased today. MD made aware. Pt appears withdrawn and isolates in her room. Pt given Vistaril 50 mg prn at pt request for anxiety. Med effective. A: Medications administered as ordered per MD. Medications reviewed with pt. Verbal support provided. Pt encouraged to attend groups. 15 minute checks performed for safety.  R: Pt receptive to. Pt verbalizes understanding of med regimen.

## 2016-01-08 NOTE — Progress Notes (Signed)
[facility] MD Progress Note  01/08/2016 2:47 PM [place]  MRN:  [id number] Subjective:  Patient reports ongoing symptoms - depression, sadness, some passive SI, but denies any active suicidal ideations, no plan or intention of hurting self and contracts for safety on the unit . She states she is optimistic her psychiatric medications will " kick in" and work well for her, as she has been on this regimen in the past with good response , but states " I don't think they are helping yet ". Reports fair sleep, low energy level, ongoing depression, sadness . Objective : I have discussed case with treatment team and have met with patient. As per staff, patient presents with depression and a flat affect. Visible on unit at times, but not interacting significantly with peers and tends to keep to self and isolate . No disruptive or agitated behaviors on unit. As above, currently contracts for safety on the unit, but reports some ongoing passive SI. Denies medication side effects. As above,states she does not feel medications are working yet, but reports history of good tolerance and good response to these medications in the past .  Principal Problem: Major depressive disorder, single episode, severe without psychotic features ([place]) Diagnosis:   Patient Active Problem List   Diagnosis Date Noted  . Major depressive disorder, single episode, severe without psychotic features ([place]) [F32.2] 01/05/2016  . Severe episode of recurrent major depressive disorder, with psychotic features ([place]) [F33.3]   . MDD (major depressive disorder), recurrent episode, severe ([place]) [F33.2] 04/15/2015  . Hyperthyroidism [E05.90] 03/21/2015  . Tachycardia [R00.0] 03/21/2015  . Palpitations [R00.2] 03/21/2015  . Chest pain [R07.9] 03/21/2015  . MDD (major depressive disorder), recurrent episode, moderate ([place]) [F33.1] 03/16/2015  . Consultation for female sterilization [O84.16] 05/09/2013  . NSVD (normal spontaneous vaginal  delivery) [O80] 03/30/2013  . Chronic hypertension in pregnancy [O10.919] 03/29/2013  . HTN in pregnancy, chronic [O10.919] 03/28/2013  . Transient hypertension of pregnancy, antepartum [O13.9] 03/20/2013  . GERD (gastroesophageal reflux disease) [K21.9] 01/18/2013  . Hyperemesis gravidarum before end of [redacted] week gestation with electrolyte imbalance [O21.1] 09/23/2012  . Trichomonas [A59.9] 09/23/2012  . Supervision of high-risk pregnancy [O09.90] 04/19/2011  . History of molar pregnancy, antepartum [O09.A0] 03/22/2011  . Hyperthyroidism, antepartum [S06.301, E07.9] 03/16/2011  . Sickle cell trait ([place]) [D57.3] 12/30/2010   Total Time spent with patient: 20 minutes   Past Medical History:  Past Medical History:  Diagnosis Date  . Cholecystitis 08/18/2011  . Headache(784.0)    otc med prn  . History of recurrent UTIs    recurrent UTIs  . Hypertension   . Hyperthyroidism   . Joint pain    knees and shoulders - otc med prn  . Sickle cell trait ([place])   . Sickle cell trait ([place])   . SVD (spontaneous vaginal delivery)    x 3 WH    Past Surgical History:  Procedure Laterality Date  . CHOLECYSTECTOMY  08/19/2011   Procedure: LAPAROSCOPIC CHOLECYSTECTOMY WITH INTRAOPERATIVE CHOLANGIOGRAM;  Surgeon: Madilyn Hook, DO;  Location: [facility];  Service: General;  Laterality: N/A;  . DILATION AND CURETTAGE OF UTERUS     molar preg  . LAPAROSCOPIC BILATERAL SALPINGECTOMY Bilateral 05/09/2013   Procedure: LAPAROSCOPIC BILATERAL SALPINGECTOMY;  Surgeon: Emily Filbert, MD;  Location: [facility];  Service: Gynecology;  Laterality: Bilateral;   Family History:  Family History  Problem Relation Age of Onset  . Diabetes Mother   . Cancer Father   . Diabetes Maternal Uncle   .  Cancer Maternal Grandmother   . Cancer Maternal Grandfather   . Hypertension Maternal Grandfather   . Diabetes Maternal Grandfather   . Sickle cell anemia Son   . Asthma Son   . Heart disease Paternal Grandfather   . Hearing loss  Paternal Aunt     Social History:  History  Alcohol Use No     History  Drug Use No    Social History   Social History  . Marital status: Single    Spouse name: N/A  . Number of children: N/A  . Years of education: N/A   Social History Main Topics  . Smoking status: Never Smoker  . Smokeless tobacco: Never Used  . Alcohol use No  . Drug use: No  . Sexual activity: Yes    Birth control/ protection: Surgical   Other Topics Concern  . None   Social History Narrative   2 Adults, 4 children at home   FOB involved   Additional Social History:    Pain Medications: denies Prescriptions: denies Over the Counter: denies History of alcohol / drug use?: No history of alcohol / drug abuse  Sleep: improving   Appetite:  Fair  Current Medications: Current Facility-Administered Medications  Medication Dose Route Frequency Provider Last Rate Last Dose  . acetaminophen (TYLENOL) tablet 650 mg  650 mg Oral Q6H PRN [name], NP      . alum & mag hydroxide-simeth (MAALOX/MYLANTA) 200-200-20 MG/5ML suspension 30 mL  30 mL Oral Q4H PRN [name], NP   30 mL at 01/07/16 2037  . buPROPion (WELLBUTRIN XL) 24 hr tablet 150 mg  150 mg Oral Daily Jenne Campus, MD   150 mg at 01/08/16 0816  . citalopram (CELEXA) tablet 10 mg  10 mg Oral Daily [name], NP   10 mg at 01/08/16 0814  . hydrOXYzine (ATARAX/VISTARIL) tablet 50 mg  50 mg Oral Q8H PRN Jenne Campus, MD   50 mg at 01/08/16 0817  . magnesium hydroxide (MILK OF MAGNESIA) suspension 30 mL  30 mL Oral Daily PRN [name], NP      . methimazole (TAPAZOLE) tablet 20 mg  20 mg Oral BID [name], NP   20 mg at 01/08/16 0814  . metoprolol tartrate (LOPRESSOR) tablet 12.5 mg  12.5 mg Oral BID [name], NP   12.5 mg at 01/08/16 0814  . QUEtiapine (SEROQUEL) tablet 200 mg  200 mg Oral QHS Jenne Campus, MD   200 mg at 01/07/16 2203    Lab Results:  No results found for this or any previous visit  (from the past 48 hour(s)).  Blood Alcohol level:  Lab Results  Component Value Date   ETH <5 01/05/2016   ETH <5 04/54/0981    Metabolic Disorder Labs: Lab Results  Component Value Date   HGBA1C 5.4 04/17/2015   MPG 108 04/17/2015   No results found for: PROLACTIN Lab Results  Component Value Date   CHOL 112 04/17/2015   TRIG 110 04/17/2015   HDL 31 (L) 04/17/2015   CHOLHDL 3.6 04/17/2015   VLDL 22 04/17/2015   LDLCALC 59 04/17/2015    Physical Findings: AIMS:  , ,  ,  ,    CIWA:    COWS:     Musculoskeletal: Strength & Muscle Tone: within normal limits Gait & Station: normal Patient leans: N/A  Psychiatric Specialty Exam: Physical Exam  ROS does not endorse headache, chest pain or  shortness of breath.  Blood pressure 109/72, pulse 94, temperature 97.7 F (36.5 C), temperature source Oral, resp. rate 16, height '5\' 5"'$  (1.651 m), weight 162 lb (73.5 kg), last menstrual period 12/19/2015, SpO2 100 %.Body mass index is 26.96 kg/m.  General Appearance: Fairly Groomed  Eye Contact:  Fair  Speech:  Normal Rate  Volume:  Normal  Mood:  Reports she continues to feel depressed   Affect:  Constricted  Thought Process:  Linear  Orientation:  Full (Time, Place, and Person)  Thought Content:  no hallucinations, no delusions , not internally preoccupied   Suicidal Thoughts:  denies suicidal plan or intention, contracts for safety on the unit.  Describes some passive thoughts of death, dying , but she is future oriented and for example she states she is optimistic because her medications have worked well for her in the past   Homicidal Thoughts:  No denies any homicidal or violent ideations   Memory:  recent and remote grossly intact   Judgement:  Other:  improving   Insight:  Improving   Psychomotor Activity:  Normal  Concentration:  Concentration: Good and Attention Span: Good  Recall:  [place] of Knowledge:  Good  Language:  Good  Akathisia:  Negative  Handed:   Right  AIMS (if indicated):     Assets:  Desire for Improvement Social Support  ADL's:  Improved   Cognition:  Intact   Sleep:  Number of Hours: 6.75   Assessment - patient remains depressed, sad, tends to be isolative, with little interactions with peers. Describes some passive SI, but denies any active SI or self injurious ideations /contracts for safety on the unit . Tolerating medications well - reports history of good medication tolerance and response- had been off medications for several weeks to months prior to admission and they have been restarted. Denies  side effects at this time. Treatment Plan Summary: Daily contact with patient to assess and evaluate symptoms and progress in treatment, Medication management, Plan inpatient admission  and medications as below Encourage ongoing group and milieu participation to work on coping skills and symptom reduction Continue  Seroquel  200 mgrs  QHS for mood disorder, depression  Increase  Celexa to 20 mgrs QDAY for depression  Continue Wellbutrin XL 150 mgrs QAM for depression  Continue Vistaril  50 mgrs Q 8 hours PRN for anxiety  Treatment team working on disposition planning options   Neita Garnet, MD 01/08/2016, 2:47 PMPatient ID: Grace [name], female   DOB: Jan 17, 1986, 30 y.o.   MRN: [id number]

## 2016-01-09 LAB — LIPID PANEL
Cholesterol: 121 mg/dL (ref 0–200)
HDL: 37 mg/dL — ABNORMAL LOW (ref >40)
LDL CALC: 68 mg/dL (ref 0–99)
Total CHOL/HDL Ratio: 3.3 RATIO
Triglycerides: 79 mg/dL (ref <150)
VLDL: 16 mg/dL (ref 0–40)

## 2016-01-09 MED ORDER — INFLUENZA VAC SPLIT QUAD 0.5 ML IM SUSY
0.5000 mL | PREFILLED_SYRINGE | INTRAMUSCULAR | Status: AC
  Administered 2016-01-10: 0.5 mL via INTRAMUSCULAR
  Filled 2016-01-09: qty 0.5

## 2016-01-09 NOTE — Progress Notes (Signed)
[facility] Group Notes:  (Nursing/MHT/Case Management/Adjunct)  Date:  01/09/2016  Time:  11:37 PM  Type of Therapy:  Psychoeducational Skills  Participation Level:  Minimal  Participation Quality:  Attentive  Affect:  Flat  Cognitive:  Appropriate  Insight:  Appropriate  Engagement in Group:  Limited  Modes of Intervention:  Education  Summary of Progress/Problems: The patient expressed in group this evening that she did not have a good day since she had issues with her blood pressure. No additional details were provided about her day. As a relapse prevention strategy, her goal is to go see a therapist.   Westly PamGOODMAN, Ravindra Baranek S 01/09/2016, 11:37 PM

## 2016-01-09 NOTE — Progress Notes (Addendum)
Administered tapazole. Patient brought to RN attention that she feels lightheaded when she gets up. Re-educated her on HTN med, and to make sure does not make sudden changes in position. She plans to continue learn coping skills to assist with depression. Held evening HTN meds resulted BP/P, and lightedness/dizziness. Staff will continue to monitor, meet needs, and maintain safety.

## 2016-01-09 NOTE — Progress Notes (Signed)
Patient has been up in the dayroom watching tv with minimal interaction with peers. She attended group this evening.She plans to go to [facility] discharge. Writer reports feeling dizzy and light headed when she moves around. Writer re-educated her on fall precaution and encouraged her to inform the doctor on tomorrow about this issue and Clinical research associatewriter will reports off concern to day shift nurse. Patient currently denies having pain, -si/hi/a/v hall. Support and encouragement offered, safety maintained on unit, will continue to monitor.

## 2016-01-09 NOTE — Progress Notes (Signed)
Recreation Therapy Notes  Date: 01/09/16 Time: 0930 Location: 300 Hall Group Room  Group Topic: Stress Management  Goal Area(s) Addresses:  Patient will verbalize importance of using healthy stress management.  Patient will identify positive emotions associated with healthy stress management.   Intervention: Stress Management  Activity :  [facility] Visual[facility].  LRT introduced the concept of guided imagery.  LRT read a script to allow patients to participate in activity.  Patients were to follow along as LRT read script.  Education:  Stress Management, Discharge Planning.   Education Outcome: Needs additional education  Clinical Observations/Feedback:  Pt did not attend group.   Caroll RancherMarjette Tanzania Basham, LRT/CTRS         Caroll RancherLindsay, Domenica Weightman A 01/09/2016 12:51 PM

## 2016-01-09 NOTE — Tx Team (Signed)
Interdisciplinary Treatment and Diagnostic Plan Update  01/09/2016 Time of Session: 11:23 AM  Grace [name] MRN: [id number]  Principal Diagnosis: Major depressive disorder, single episode, severe without psychotic features (HCC)  Secondary Diagnoses: Principal Problem:   Major depressive disorder, single episode, severe without psychotic features (HCC)   Current Medications:  Current Facility-Administered Medications  Medication Dose Route Frequency Provider Last Rate Last Dose  . acetaminophen (TYLENOL) tablet 650 mg  650 mg Oral Q6H PRN Charm RingsJamison Y Lord, NP      . alum & mag hydroxide-simeth (MAALOX/MYLANTA) 200-200-20 MG/5ML suspension 30 mL  30 mL Oral Q4H PRN Charm RingsJamison Y Lord, NP   30 mL at 01/07/16 2037  . buPROPion (WELLBUTRIN XL) 24 hr tablet 150 mg  150 mg Oral Daily Craige CottaFernando A Cobos, MD   150 mg at 01/09/16 0810  . citalopram (CELEXA) tablet 20 mg  20 mg Oral Daily Craige CottaFernando A Cobos, MD   20 mg at 01/09/16 0812  . hydrOXYzine (ATARAX/VISTARIL) tablet 50 mg  50 mg Oral Q8H PRN Craige CottaFernando A Cobos, MD   50 mg at 01/08/16 1819  . [START ON 01/10/2016] Influenza vac split quadrivalent PF (FLUARIX) injection 0.5 mL  0.5 mL Intramuscular Tomorrow-1000 Fernando A Cobos, MD      . magnesium hydroxide (MILK OF MAGNESIA) suspension 30 mL  30 mL Oral Daily PRN Charm RingsJamison Y Lord, NP      . methimazole (TAPAZOLE) tablet 20 mg  20 mg Oral BID Charm RingsJamison Y Lord, NP   20 mg at 01/08/16 1700  . metoprolol tartrate (LOPRESSOR) tablet 12.5 mg  12.5 mg Oral BID Charm RingsJamison Y Lord, NP   12.5 mg at 01/09/16 0813  . QUEtiapine (SEROQUEL) tablet 200 mg  200 mg Oral QHS Craige CottaFernando A Cobos, MD   200 mg at 01/08/16 2225    PTA Medications: Prescriptions Prior to Admission  Medication Sig Dispense Refill Last Dose  . buPROPion (WELLBUTRIN XL) 300 MG 24 hr tablet Take 1 tablet (300 mg total) by mouth daily. (Patient not taking: Reported on 01/06/2016) 30 tablet 0 Not Taking at Unknown time  . citalopram (CELEXA) 40 MG  tablet Take 1 tablet (40 mg total) by mouth daily. (Patient not taking: Reported on 01/06/2016) 30 tablet 0 Not Taking at Unknown time  . hydrOXYzine (ATARAX/VISTARIL) 25 MG tablet Take 1 tablet (25 mg total) by mouth every 6 (six) hours as needed for anxiety. (Patient not taking: Reported on 01/06/2016) 30 tablet 0 Not Taking at Unknown time  . methimazole (TAPAZOLE) 10 MG tablet Take 2 tablets (20 mg total) by mouth 2 (two) times daily. For hyperthyroidim (Patient not taking: Reported on 01/06/2016) 120 tablet 3 Not Taking at Unknown time  . metoprolol tartrate (LOPRESSOR) 25 MG tablet Take 0.5 tablets (12.5 mg total) by mouth 2 (two) times daily. For high blood pressure (Patient not taking: Reported on 01/06/2016) 30 tablet 0 Not Taking at Unknown time  . naproxen (NAPROSYN) 500 MG tablet Take 1 tablet (500 mg total) by mouth 2 (two) times daily. (Patient not taking: Reported on 01/06/2016) 20 tablet 0 Not Taking at Unknown time  . pseudoephedrine (SUDAFED) 60 MG tablet Take 1 tablet (60 mg total) by mouth every 4 (four) hours as needed for congestion. (Patient not taking: Reported on 01/06/2016) 15 tablet 0 Not Taking at Unknown time  . QUEtiapine (SEROQUEL) 200 MG tablet Take 1 tablet (200 mg total) by mouth at bedtime. (Patient not taking: Reported on 01/06/2016) 30 tablet 0 Not Taking at  Unknown time  . QUEtiapine (SEROQUEL) 25 MG tablet Take 1 tablet (25 mg total) by mouth 3 (three) times daily. (Patient not taking: Reported on 01/06/2016) 90 tablet 0 Not Taking at Unknown time    Treatment Modalities: Medication Management, Group therapy, Case management,  1 to 1 session with clinician, Psychoeducation, Recreational therapy.   Physician Treatment Plan for Primary Diagnosis: Major depressive disorder, single episode, severe without psychotic features (HCC) Long Term Goal(s): Improvement in symptoms so as ready for discharge  Short Term Goals: Ability to identify changes in lifestyle to reduce  recurrence of condition will improve, Ability to verbalize feelings will improve, Ability to disclose and discuss suicidal ideas, Ability to demonstrate self-control will improve, Ability to identify and develop effective coping behaviors will improve, Ability to maintain clinical measurements within normal limits will improve, Compliance with prescribed medications will improve and Ability to identify triggers associated with substance abuse/mental health issues will improve  Medication Management: Evaluate patient's response, side effects, and tolerance of medication regimen.  Therapeutic Interventions: 1 to 1 sessions, Unit Group sessions and Medication administration.  Evaluation of Outcomes: Progressing  Physician Treatment Plan for Secondary Diagnosis: Principal Problem:   Major depressive disorder, single episode, severe without psychotic features (HCC)   Long Term Goal(s): Improvement in symptoms so as ready for discharge  Short Term Goals: Ability to identify changes in lifestyle to reduce recurrence of condition will improve, Ability to verbalize feelings will improve, Ability to disclose and discuss suicidal ideas, Ability to demonstrate self-control will improve, Ability to identify and develop effective coping behaviors will improve, Ability to maintain clinical measurements within normal limits will improve, Compliance with prescribed medications will improve and Ability to identify triggers associated with substance abuse/mental health issues will improve  Medication Management: Evaluate patient's response, side effects, and tolerance of medication regimen.  Therapeutic Interventions: 1 to 1 sessions, Unit Group sessions and Medication administration.  Evaluation of Outcomes: Progressing   RN Treatment Plan for Primary Diagnosis: Major depressive disorder, single episode, severe without psychotic features (HCC) Long Term Goal(s): Knowledge of disease and therapeutic regimen to  maintain health will improve  Short Term Goals: Ability to verbalize feelings will improve, Ability to disclose and discuss suicidal ideas and Ability to identify and develop effective coping behaviors will improve  Medication Management: RN will administer medications as ordered by provider, will assess and evaluate patient's response and provide education to patient for prescribed medication. RN will report any adverse and/or side effects to prescribing provider.  Therapeutic Interventions: 1 on 1 counseling sessions, Psychoeducation, Medication administration, Evaluate responses to treatment, Monitor vital signs and CBGs as ordered, Perform/monitor CIWA, COWS, AIMS and Fall Risk screenings as ordered, Perform wound care treatments as ordered.  Evaluation of Outcomes: Progressing   LCSW Treatment Plan for Primary Diagnosis: Major depressive disorder, single episode, severe without psychotic features (HCC) Long Term Goal(s): Safe transition to appropriate next level of care at discharge, Engage patient in therapeutic group addressing interpersonal concerns.  Short Term Goals: Engage patient in aftercare planning with referrals and resources, Increase emotional regulation, Identify triggers associated with mental health/substance abuse issues and Increase skills for wellness and recovery  Therapeutic Interventions: Assess for all discharge needs, 1 to 1 time with Social worker, Explore available resources and support systems, Assess for adequacy in community support network, Educate family and significant other(s) on suicide prevention, Complete Psychosocial Assessment, Interpersonal group therapy.  Evaluation of Outcomes: Progressing   Progress in Treatment: Attending groups: Yes Participating in groups:  Minimally Taking medication as prescribed: Yes, MD continues to assess for medication changes as needed Toleration medication: Yes, no side effects reported at this time Family/Significant  other contact made: No, Pt declines Patient understands diagnosis: Developing insight Discussing patient identified problems/goals with staff: Yes Medical problems stabilized or resolved: Yes Denies suicidal/homicidal ideation: No, passive SI endorsed Issues/concerns per patient self-inventory: None Other: N/A  New problem(s) identified: None identified at this time.   New Short Term/Long Term Goal(s): None identified at this time.   Discharge Plan or Barriers: Pt will return home and follow-up with outpatient services.   Reason for Continuation of Hospitalization: Depression Hallucinations Medication stabilization Suicidal ideation  Estimated Length of Stay: 2-3 days  Attendees: Patient: 01/09/2016  11:23 AM  Physician: Dr. Jama Flavors 01/09/2016  11:23 AM  Nursing: Aubery Lapping, RN 01/09/2016  11:23 AM  RN Care Manager: Onnie Boer, RN 01/09/2016  11:23 AM  Social Worker: Chad Cordial, LCSW; Belenda Cruise Drinkard, LCSW 01/09/2016  11:23 AM  Recreational Therapist:  01/09/2016  11:23 AM  Other: Armandina Stammer, NP; Gray Bernhardt, NP 01/09/2016  11:23 AM  Other:  01/09/2016  11:23 AM  Other: 01/09/2016  11:23 AM    Scribe for Treatment Team: Elaina Hoops, LCSW 01/09/2016 11:23 AM

## 2016-01-09 NOTE — Progress Notes (Signed)
Patient A/O. Denies SI/HI/pain. Flat affect. Patient tolerated meds. Educated her on medication. She was informed one medication was not available, but was one the medication is available will administered. Patient noted she slept well. Staff will continue to monitor, meet needs, and maintain safety.

## 2016-01-09 NOTE — [facility] Group Notes (Signed)
[facility] LCSW Group Therapy 01/09/2016 1:15pm  Type of Therapy: Group Therapy- Feelings Around Relapse and Recovery  Participation Level: Reserved  Participation Quality:  Appropriate  Affect:  Flat  Cognitive: Alert and Oriented   Insight:  Developing   Engagement in Therapy: Developing/Improving and Engaged   Modes of Intervention: Clarification, Confrontation, Discussion, Education, Exploration, Limit-setting, Orientation, Problem-solving, Rapport Building, Dance movement psychotherapisteality Testing, Socialization and Support  Summary of Progress/Problems: The topic for today was feelings about relapse. The group discussed what relapse prevention is to them and identified triggers that they are on the path to relapse. Members also processed their feeling towards relapse and were able to relate to common experiences. Group also discussed coping skills that can be used for relapse prevention.  Pt participated minimally but was able to identify isolation as an early warning sign for relapse.    Therapeutic Modalities:   Cognitive Behavioral Therapy Solution-Focused Therapy Assertiveness Training Relapse Prevention Therapy    Lamar SprinklesLauren Carter, LCSWA [phone] 01/09/2016 6:26 PM

## 2016-01-09 NOTE — Progress Notes (Signed)
[facility] MD Progress Note  01/09/2016 2:06 PM [place]  MRN:  [id number] Subjective:  Patient reports some ongoing depression, sadness. She does state she feels some improvement , but states " I don't think that my medications are working yet ". Denies medication side effects.. Objective : I have discussed case with treatment team and have met with patient. She tends to isolate in room, although does go to some groups and attended karaoke last evening . We reviewed this. Patient states " I have always been a loner". States " my dad was also a loner so maybe it is genetic ". States that she often prefers to be alone, and feels "OK" being alone. She also acknowledges to some degree of social anxiety /social phobia, and states another reason to isolate is to decrease social anxiety. She denies suicidal plan or intention at this time and is contracting for safety. No disruptive or agitated behaviors on unit .  As above, currently contracts for safety on the unit, but reports some ongoing passive SI. Denies auditory hallucinations, describes intermittent fleeting " shadows and figures" , but does not appear internally preoccupied or psychotic .  Principal Problem: Major depressive disorder, single episode, severe without psychotic features ([place]) Diagnosis:   Patient Active Problem List   Diagnosis Date Noted  . Major depressive disorder, single episode, severe without psychotic features ([place]) [F32.2] 01/05/2016  . Severe episode of recurrent major depressive disorder, with psychotic features ([place]) [F33.3]   . MDD (major depressive disorder), recurrent episode, severe ([place]) [F33.2] 04/15/2015  . Hyperthyroidism [E05.90] 03/21/2015  . Tachycardia [R00.0] 03/21/2015  . Palpitations [R00.2] 03/21/2015  . Chest pain [R07.9] 03/21/2015  . MDD (major depressive disorder), recurrent episode, moderate ([place]) [F33.1] 03/16/2015  . Consultation for female sterilization [T24.58] 05/09/2013  . NSVD  (normal spontaneous vaginal delivery) [O80] 03/30/2013  . Chronic hypertension in pregnancy [O10.919] 03/29/2013  . HTN in pregnancy, chronic [O10.919] 03/28/2013  . Transient hypertension of pregnancy, antepartum [O13.9] 03/20/2013  . GERD (gastroesophageal reflux disease) [K21.9] 01/18/2013  . Hyperemesis gravidarum before end of [redacted] week gestation with electrolyte imbalance [O21.1] 09/23/2012  . Trichomonas [A59.9] 09/23/2012  . Supervision of high-risk pregnancy [O09.90] 04/19/2011  . History of molar pregnancy, antepartum [O09.A0] 03/22/2011  . Hyperthyroidism, antepartum [K99.833, E07.9] 03/16/2011  . Sickle cell trait ([place]) [D57.3] 12/30/2010   Total Time spent with patient: 20 minutes   Past Medical History:  Past Medical History:  Diagnosis Date  . Cholecystitis 08/18/2011  . Headache(784.0)    otc med prn  . History of recurrent UTIs    recurrent UTIs  . Hypertension   . Hyperthyroidism   . Joint pain    knees and shoulders - otc med prn  . Sickle cell trait ([place])   . Sickle cell trait ([place])   . SVD (spontaneous vaginal delivery)    x 3 WH    Past Surgical History:  Procedure Laterality Date  . CHOLECYSTECTOMY  08/19/2011   Procedure: LAPAROSCOPIC CHOLECYSTECTOMY WITH INTRAOPERATIVE CHOLANGIOGRAM;  Surgeon: Madilyn Hook, DO;  Location: [place];  Service: General;  Laterality: N/A;  . DILATION AND CURETTAGE OF UTERUS     molar preg  . LAPAROSCOPIC BILATERAL SALPINGECTOMY Bilateral 05/09/2013   Procedure: LAPAROSCOPIC BILATERAL SALPINGECTOMY;  Surgeon: Emily Filbert, MD;  Location: [facility];  Service: Gynecology;  Laterality: Bilateral;   Family History:  Family History  Problem Relation Age of Onset  . Diabetes Mother   . Cancer Father   . Diabetes  Maternal Uncle   . Cancer Maternal Grandmother   . Cancer Maternal Grandfather   . Hypertension Maternal Grandfather   . Diabetes Maternal Grandfather   . Sickle cell anemia Son   . Asthma Son   . Heart disease Paternal  Grandfather   . Hearing loss Paternal Aunt     Social History:  History  Alcohol Use No     History  Drug Use No    Social History   Social History  . Marital status: Single    Spouse name: N/A  . Number of children: N/A  . Years of education: N/A   Social History Main Topics  . Smoking status: Never Smoker  . Smokeless tobacco: Never Used  . Alcohol use No  . Drug use: No  . Sexual activity: Yes    Birth control/ protection: Surgical   Other Topics Concern  . None   Social History Narrative   2 Adults, 4 children at home   FOB involved   Additional Social History:    Pain Medications: denies Prescriptions: denies Over the Counter: denies History of alcohol / drug use?: No history of alcohol / drug abuse  Sleep: improving   Appetite:  Fair  Current Medications: Current Facility-Administered Medications  Medication Dose Route Frequency Provider Last Rate Last Dose  . acetaminophen (TYLENOL) tablet 650 mg  650 mg Oral Q6H PRN [name], NP      . alum & mag hydroxide-simeth (MAALOX/MYLANTA) 200-200-20 MG/5ML suspension 30 mL  30 mL Oral Q4H PRN [name], NP   30 mL at 01/07/16 2037  . buPROPion (WELLBUTRIN XL) 24 hr tablet 150 mg  150 mg Oral Daily Jenne Campus, MD   150 mg at 01/09/16 0810  . citalopram (CELEXA) tablet 20 mg  20 mg Oral Daily Jenne Campus, MD   20 mg at 01/09/16 0812  . hydrOXYzine (ATARAX/VISTARIL) tablet 50 mg  50 mg Oral Q8H PRN Jenne Campus, MD   50 mg at 01/08/16 1819  . [START ON 01/10/2016] Influenza vac split quadrivalent PF (FLUARIX) injection 0.5 mL  0.5 mL Intramuscular Tomorrow-1000 Harlyn Italiano A Teanna Elem, MD      . magnesium hydroxide (MILK OF MAGNESIA) suspension 30 mL  30 mL Oral Daily PRN [name], NP      . methimazole (TAPAZOLE) tablet 20 mg  20 mg Oral BID [name], NP   20 mg at 01/09/16 1127  . metoprolol tartrate (LOPRESSOR) tablet 12.5 mg  12.5 mg Oral BID [name], NP   12.5 mg at  01/09/16 0813  . QUEtiapine (SEROQUEL) tablet 200 mg  200 mg Oral QHS Jenne Campus, MD   200 mg at 01/08/16 2225    Lab Results:  Results for orders placed or performed during the hospital encounter of 01/05/16 (from the past 48 hour(s))  Lipid panel     Status: Abnormal   Collection Time: 01/09/16  6:14 AM  Result Value Ref Range   Cholesterol 121 0 - 200 mg/dL   Triglycerides 79 <150 mg/dL   HDL 37 (L) >40 mg/dL   Total CHOL/HDL Ratio 3.3 RATIO   VLDL 16 0 - 40 mg/dL   LDL Cholesterol 68 0 - 99 mg/dL    Comment:        Total Cholesterol/HDL:CHD Risk Coronary Heart Disease Risk Table                     Men  Women  1/2 Average Risk   3.4   3.3  Average Risk       5.0   4.4  2 X Average Risk   9.6   7.1  3 X Average Risk  23.4   11.0        Use the calculated Patient Ratio above and the CHD Risk Table to determine the patient's CHD Risk.        ATP III CLASSIFICATION (LDL):  <100     mg/dL   Optimal  100-129  mg/dL   Near or Above                    Optimal  130-159  mg/dL   Borderline  160-189  mg/dL   High  >190     mg/dL   Very High Performed at [facility]     Blood Alcohol level:  Lab Results  Component Value Date   [facility] <5 01/05/2016   ETH <5 44/06/4740    Metabolic Disorder Labs: Lab Results  Component Value Date   HGBA1C 5.4 04/17/2015   MPG 108 04/17/2015   No results found for: PROLACTIN Lab Results  Component Value Date   CHOL 121 01/09/2016   TRIG 79 01/09/2016   HDL 37 (L) 01/09/2016   CHOLHDL 3.3 01/09/2016   VLDL 16 01/09/2016   LDLCALC 68 01/09/2016   LDLCALC 59 04/17/2015    Physical Findings: AIMS:  , ,  ,  ,    CIWA:    COWS:     Musculoskeletal: Strength & Muscle Tone: within normal limits Gait & Station: normal Patient leans: N/A  Psychiatric Specialty Exam: Physical Exam  ROS does not endorse headache, chest pain or shortness of breath. No nausea or vomiting   Blood pressure 101/64, pulse (!) 103,  temperature 97.6 F (36.4 C), temperature source Oral, resp. rate 16, height _0  (1.651 m), weight 162 lb (73.5 kg), last menstrual period 12/19/2015, SpO2 100 %.Body mass index is 26.96 kg/m.  General Appearance: Fairly Groomed  Eye Contact:  Improving   Speech:  Normal Rate  Volume:  Normal  Mood:  Remains depressed, some improvement compared to admission   Affect:  remains constricted, more reactive, does smile briefly at times   Thought Process:  Linear  Orientation:  Full (Time, Place, and Person)  Thought Content:  Describes intermittent and fleeting visual hallucinations, described as shadows, does not appear internally preoccupied , no delusions are expressed   Suicidal Thoughts:  denies suicidal plan or intention, contracts for safety on the unit.    Homicidal Thoughts:  No denies any homicidal or violent ideations   Memory:  recent and remote grossly intact   Judgement:  Other:  improving   Insight:  Improving   Psychomotor Activity:  Normal  Concentration:  Concentration: Good and Attention Span: Good  Recall:  [place] of Knowledge:  Good  Language:  Good  Akathisia:  Negative  Handed:  Right  AIMS (if indicated):     Assets:  Desire for Improvement Social Support  ADL's:  Improved   Cognition:  Intact   Sleep:  Number of Hours: 5.25   Assessment - patient continues to present with some depression, constricted affect, social isolation . 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 Tolerating medications well  at this time. No active suicidal ideations .  Treatment Plan Summary: Daily contact with patient to assess and evaluate symptoms and progress in treatment, Medication management, Plan inpatient admission  and medications as below Encourage ongoing group and milieu participation to work on coping skills and symptom  reduction Continue  Seroquel  200 mgrs  QHS for mood disorder, depression  Continue  Celexa  20 mgrs QDAY for depression  Continue Wellbutrin XL 150 mgrs QAM for depression  Continue Vistaril  50 mgrs Q 8 hours PRN for anxiety  Treatment team working on disposition planning options   Neita Garnet, MD 01/09/2016, 2:06 PMPatient ID: Grace [name], female   DOB: 1985/05/03, 30 y.o.   MRN: [id number]

## 2016-01-10 LAB — PROLACTIN: PROLACTIN: 35.2 ng/mL — AB (ref 4.8–23.3)

## 2016-01-10 LAB — HEMOGLOBIN A1C
HEMOGLOBIN A1C: 4.9 % (ref 4.8–5.6)
MEAN PLASMA GLUCOSE: 94 mg/dL

## 2016-01-10 MED ORDER — QUETIAPINE FUMARATE 50 MG PO TABS
250.0000 mg | ORAL_TABLET | Freq: Every day | ORAL | Status: DC
  Administered 2016-01-10 – 2016-01-15 (×6): 250 mg via ORAL
  Filled 2016-01-10 (×8): qty 1

## 2016-01-10 NOTE — Plan of Care (Signed)
Problem: Medication: Goal: Compliance with prescribed medication regimen will improve Outcome: Progressing Patient was compliant with her scheduled medication.

## 2016-01-10 NOTE — Progress Notes (Signed)
D: Pt presents with flat affect and depressed mood this morning. Pt reports decreasing depression 8/10 from 10/10 on admit. Pt isolative this morning and appears withdrawn. Pt endorses passive suicidal thoughts but denies any active suicidal thoughts this morning. Pt last endorsed suicidal thoughts yesterday. Pt verbally contracts for safety. Pt denies AVH today. Pt reported that she slept well. Pt tolerating meds well and denies any side effects.  A: Medications reviewed with pt. Medications administered as ordered per MD. Verbal support provided. Pt encouraged to attend groups. 15 minute checks performed for safety.  R: Pt receptive to tx.

## 2016-01-10 NOTE — [facility] Group Notes (Signed)
[facility] LCSW Group Note  Group session was about Understanding Change and Initiating Change.  Participation - Present.  Began the discussion by having group participants identify Changes they can Control and then Changes they Can't Control. Then revisited with the group the list of Changes that can be Controlled to identify if the Changes are Hard or Easy to control. Particularly focused the idea of Changing on Thinking, Personality and Actions which all participants identified as Hard to change.   1- Gained understanding of the inevitability of Change. 2- Assessed each Change individually. 3- Understand the importance of change or Rewards of change. 4- Identifying one small step toward big Change.  Patient progress - Patient present during entire group but level of engagement seemed unclear due to limited eye contact and lack of contribution to discussion.  Beverly Sessionsywan J Keithon Mccoin MSW, LCSW

## 2016-01-10 NOTE — Progress Notes (Signed)
[facility] MD Progress Note  01/10/2016 1:12 PM [place]  MRN:  [id number] Subjective:  [place] Myl[place] was a down day for me my mind was racing alot. I was very negative and I was having some blood pressure problems. My depression is a 8/10. And anxiety 10/10, the vistaril helps though. I been on Vistaril for a few weeks and it does help.   Objective : I have discussed case with treatment team and have met with patient. Previous reports that patient tends to isolate in room, however she been attending groups today/ She reports that her goal today is to not isolate herself.  She denies suicidal plan or intention at this time and is contracting for safety while on the unit only. No disruptive or agitated behaviors on unit . She reports poor sleep, despite taking Vistaril and Seroquel last night. She does request that her dose be changed.  As above, currently contracts for safety on the unit, but reports some ongoing passive SI. Denies auditory hallucinations, and does not appear internally preoccupied or psychotic .  Principal Problem: Major depressive disorder, single episode, severe without psychotic features ([place]) Diagnosis:   Patient Active Problem List   Diagnosis Date Noted  . Major depressive disorder, single episode, severe without psychotic features ([place]) [F32.2] 01/05/2016  . Severe episode of recurrent major depressive disorder, with psychotic features ([place]) [F33.3]   . MDD (major depressive disorder), recurrent episode, severe ([place]) [F33.2] 04/15/2015  . Hyperthyroidism [E05.90] 03/21/2015  . Tachycardia [R00.0] 03/21/2015  . Palpitations [R00.2] 03/21/2015  . Chest pain [R07.9] 03/21/2015  . MDD (major depressive disorder), recurrent episode, moderate ([place]) [F33.1] 03/16/2015  . Consultation for female sterilization [I09.73] 05/09/2013  . NSVD (normal spontaneous vaginal delivery) [O80] 03/30/2013  . Chronic hypertension in pregnancy [O10.919] 03/29/2013  . HTN in pregnancy,  chronic [O10.919] 03/28/2013  . Transient hypertension of pregnancy, antepartum [O13.9] 03/20/2013  . GERD (gastroesophageal reflux disease) [K21.9] 01/18/2013  . Hyperemesis gravidarum before end of [redacted] week gestation with electrolyte imbalance [O21.1] 09/23/2012  . Trichomonas [A59.9] 09/23/2012  . Supervision of high-risk pregnancy [O09.90] 04/19/2011  . History of molar pregnancy, antepartum [O09.A0] 03/22/2011  . Hyperthyroidism, antepartum [Z32.992, E07.9] 03/16/2011  . Sickle cell trait ([place]) [D57.3] 12/30/2010   Total Time spent with patient: 20 minutes  Past Medical History:  Past Medical History:  Diagnosis Date  . Cholecystitis 08/18/2011  . Headache(784.0)    otc med prn  . History of recurrent UTIs    recurrent UTIs  . Hypertension   . Hyperthyroidism   . Joint pain    knees and shoulders - otc med prn  . Sickle cell trait ([place])   . Sickle cell trait ([place])   . SVD (spontaneous vaginal delivery)    x 3 WH    Past Surgical History:  Procedure Laterality Date  . CHOLECYSTECTOMY  08/19/2011   Procedure: LAPAROSCOPIC CHOLECYSTECTOMY WITH INTRAOPERATIVE CHOLANGIOGRAM;  Surgeon: Madilyn Hook, DO;  Location: [place];  Service: General;  Laterality: N/A;  . DILATION AND CURETTAGE OF UTERUS     molar preg  . LAPAROSCOPIC BILATERAL SALPINGECTOMY Bilateral 05/09/2013   Procedure: LAPAROSCOPIC BILATERAL SALPINGECTOMY;  Surgeon: Emily Filbert, MD;  Location: [facility];  Service: Gynecology;  Laterality: Bilateral;   Family History:  Family History  Problem Relation Age of Onset  . Diabetes Mother   . Cancer Father   . Diabetes Maternal Uncle   . Cancer Maternal Grandmother   . Cancer Maternal Grandfather   . Hypertension  Maternal Grandfather   . Diabetes Maternal Grandfather   . Sickle cell anemia Son   . Asthma Son   . Heart disease Paternal Grandfather   . Hearing loss Paternal Aunt     Social History:  History  Alcohol Use No     History  Drug Use No    Social  History   Social History  . Marital status: Single    Spouse name: N/A  . Number of children: N/A  . Years of education: N/A   Social History Main Topics  . Smoking status: Never Smoker  . Smokeless tobacco: Never Used  . Alcohol use No  . Drug use: No  . Sexual activity: Yes    Birth control/ protection: Surgical   Other Topics Concern  . None   Social History Narrative   2 Adults, 4 children at home   FOB involved   Additional Social History:    Pain Medications: denies Prescriptions: denies Over the Counter: denies History of alcohol / drug use?: No history of alcohol / drug abuse  Sleep: improving   Appetite:  Fair  Current Medications: Current Facility-Administered Medications  Medication Dose Route Frequency Provider Last Rate Last Dose  . acetaminophen (TYLENOL) tablet 650 mg  650 mg Oral Q6H PRN [name], NP   650 mg at 01/10/16 1302  . alum & mag hydroxide-simeth (MAALOX/MYLANTA) 200-200-20 MG/5ML suspension 30 mL  30 mL Oral Q4H PRN [name], NP   30 mL at 01/10/16 1302  . buPROPion (WELLBUTRIN XL) 24 hr tablet 150 mg  150 mg Oral Daily Jenne Campus, MD   150 mg at 01/10/16 0821  . citalopram (CELEXA) tablet 20 mg  20 mg Oral Daily Jenne Campus, MD   20 mg at 01/10/16 0821  . hydrOXYzine (ATARAX/VISTARIL) tablet 50 mg  50 mg Oral Q8H PRN Jenne Campus, MD   50 mg at 01/09/16 2301  . magnesium hydroxide (MILK OF MAGNESIA) suspension 30 mL  30 mL Oral Daily PRN [name], NP      . methimazole (TAPAZOLE) tablet 20 mg  20 mg Oral BID [name], NP   20 mg at 01/10/16 8841  . metoprolol tartrate (LOPRESSOR) tablet 12.5 mg  12.5 mg Oral BID [name], NP   12.5 mg at 01/10/16 6606  . QUEtiapine (SEROQUEL) tablet 200 mg  200 mg Oral QHS Jenne Campus, MD   200 mg at 01/09/16 2145    Lab Results:  Results for orders placed or performed during the hospital encounter of 01/05/16 (from the past 48 hour(s))  Hemoglobin A1c      Status: None   Collection Time: 01/09/16  6:14 AM  Result Value Ref Range   Hgb A1c MFr Bld 4.9 4.8 - 5.6 %    Comment: (NOTE)         Pre-diabetes: 5.7 - 6.4         Diabetes: >6.4         Glycemic control for adults with diabetes: <7.0    Mean Plasma Glucose 94 mg/dL    Comment: (NOTE) Performed At: [facility] [place], [place] [id number] Lindon Romp MD AT:[id number] Performed at [facility]   Lipid panel     Status: Abnormal   Collection Time: 01/09/16  6:14 AM  Result Value Ref Range   Cholesterol 121 0 - 200 mg/dL   Triglycerides 79 <150 mg/dL  HDL 37 (L) >40 mg/dL   Total CHOL/HDL Ratio 3.3 RATIO   VLDL 16 0 - 40 mg/dL   LDL Cholesterol 68 0 - 99 mg/dL    Comment:        Total Cholesterol/HDL:CHD Risk Coronary Heart Disease Risk Table                     Men   Women  1/2 Average Risk   3.4   3.3  Average Risk       5.0   4.4  2 X Average Risk   9.6   7.1  3 X Average Risk  23.4   11.0        Use the calculated Patient Ratio above and the CHD Risk Table to determine the patient's CHD Risk.        ATP III CLASSIFICATION (LDL):  <100     mg/dL   Optimal  100-129  mg/dL   Near or Above                    Optimal  130-159  mg/dL   Borderline  160-189  mg/dL   High  >190     mg/dL   Very High Performed at [facility]   Prolactin     Status: Abnormal   Collection Time: 01/09/16  6:14 AM  Result Value Ref Range   Prolactin 35.2 (H) 4.8 - 23.3 ng/mL    Comment: (NOTE) Performed At: [facility] [place], [place] [id number] Lindon Romp MD WJ:[id number] Performed at [facility]     Blood Alcohol level:  Lab Results  Component Value Date   [facility] <5 01/05/2016   ETH <5 21/30/8657    Metabolic Disorder Labs: Lab Results  Component Value Date   HGBA1C 4.9 01/09/2016   MPG 94 01/09/2016   MPG 108 04/17/2015   Lab Results  Component Value Date    PROLACTIN 35.2 (H) 01/09/2016   Lab Results  Component Value Date   CHOL 121 01/09/2016   TRIG 79 01/09/2016   HDL 37 (L) 01/09/2016   CHOLHDL 3.3 01/09/2016   VLDL 16 01/09/2016   LDLCALC 68 01/09/2016   LDLCALC 59 04/17/2015   Musculoskeletal: Strength & Muscle Tone: within normal limits Gait & Station: normal Patient leans: N/A  Psychiatric Specialty Exam: Physical Exam   ROS  does not endorse headache, chest pain or shortness of breath. No nausea or vomiting   Blood pressure 123/75, pulse (!) 105, temperature 97.9 F (36.6 C), temperature source Oral, resp. rate 18, height _0  (1.651 m), weight 73.5 kg (162 lb), last menstrual period 12/19/2015, SpO2 100 %.Body mass index is 26.96 kg/m.  General Appearance: Fairly Groomed  Eye Contact:  Improving   Speech:  Normal Rate  Volume:  Normal  Mood:  Depression, Anxiety  Affect:  remains constricted, more reactive, does smile briefly at times   Thought Process:  Linear  Orientation:  Full (Time, Place, and Person)  Thought Content:  Describes intermittent and fleeting visual hallucinations, described as shadows, does not appear internally preoccupied , no delusions are expressed   Suicidal Thoughts:  denies suicidal plan or intention, contracts for safety on the unit.    Homicidal Thoughts:  No denies any homicidal or violent ideations   Memory:  recent and remote grossly intact   Judgement:  Other:  improving   Insight:  Improving   Psychomotor Activity:  Normal  Concentration:  Concentration: Good and Attention Span: Good  Recall:  [place] of Knowledge:  Good  Language:  Good  Akathisia:  Negative  Handed:  Right  AIMS (if indicated):     Assets:  Desire for Improvement Social Support  ADL's:  Improved   Cognition:  Intact   Sleep:  Number of Hours: 6   Assessment - patient continues to present with some depression, constricted affect, social isolation . 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 Tolerating medications well at this time. No active suicidal ideations .   Treatment Plan Summary: Daily contact with patient to assess and evaluate symptoms and progress in treatment, Medication management, Plan inpatient admission  and medications as below Encourage ongoing group and milieu participation to work on coping skills and symptom reduction Increase Seroquel  250 mgrs  QHS for mood disorder, depression  Continue  Celexa  20 mgrs QDAY for depression  Continue Wellbutrin XL 150 mgrs QAM for depression  Continue Vistaril  50 mgrs Q 8 hours PRN for anxiety  Treatment team working on disposition planning options   Nanci Pina, FNP 01/10/2016, 1:12 PM

## 2016-01-10 NOTE — Progress Notes (Signed)
Adult Psychoeducational Group Note  Date:  01/10/2016 Time:  0900 am  Group Topic/Focus:  Coping With Mental Health Crisis:   The purpose of this group is to help patients identify strategies for coping with mental health crisis.  Group discusses possible causes of crisis and ways to manage them effectively.   Participation Level:  Did Not Attend  Participation Quality:    Affect:    Cognitive:    Insight:   Engagement in Group:    Modes of Intervention:    Additional Comments:    Charma Mocarski L 01/10/2016, 1:45 PM

## 2016-01-11 NOTE — Progress Notes (Signed)
Data. Patient denies HI, but continues to endorse passive SI, "With no plan", and to be hearing, "A radio playing at night. I can also heard mumbling. I can't hear what they are saying, no matter how hard I try to make it out." Patient is able to verbally contract for safety on the unit and to come to staff before acting on any self harm thoughts/feelings.  Patient interacting appropriatley, but minimally, with staff and other patients. On her self assessment patient reports 10/10 for depression and hopelessness and 9/10 for anxiety. Her goal for today is: "Talking". Action. Emotional support and encouragement offered. Education provided on medication, indications and side effect. Q 15 minute checks done for safety. Response. Safety on the unit maintained through 15 minute checks.  Medications taken as prescribed. Attended groups. Remained calm and appropriate through out shift.

## 2016-01-11 NOTE — Progress Notes (Signed)
[facility] Group Notes:  (Nursing/MHT/Case Management/Adjunct)  Date:  01/11/2016  Time:  12:13 AM  Type of Therapy:  Psychoeducational Skills  Participation Level:  Active  Participation Quality:  Appropriate  Affect:  Flat  Cognitive:  Appropriate  Insight:  Appropriate  Engagement in Group:  Developing/Improving  Modes of Intervention:  Education  Summary of Progress/Problems: The patient expressed in group last evening that she didn't have a very good day since she had a bad telephone call. The patient coped with the situation by talking to her peers. As for the theme of the day, her coping skill will be to listen to music.   Grace [name] S 01/11/2016, 12:13 AM

## 2016-01-11 NOTE — Progress Notes (Signed)
[facility] MD Progress Note  01/11/2016 11:47 AM Grace [name]  MRN:  [id number] Subjective:  Yesterday was a rough day for me. I got into a confrontation with my partner over the phone while I was here. I was mad, and I walked up and down the hallway and counted to 10. I could feel myself going there and it wasn't good but I was able to get myself through it. I have a headache this morning, and took Tylenol and it helped.   Per nursing: Writer spoke with patient 1:1 and she reports that her day started off pretty rough earlier over a phone call that made her pretty upset but she calmed herself down. She reports that she feels that she may have some anger issues. She denies having or feeling light headed or dizzy today. Writer informed her of the seroquel increase and she was pleased. She has been up in the dayroom watching tv but little to no interaction with peers.  Objective : I have discussed case with treatment team and have met with patient. Per nursing no new complaints at this time. However patient did attend group yesterday evening and help procress thoughts about her argument.  She is observed lying down in the bed at 11am, states she had a headache and did not go to group. However she was able to go to breakfast, and took some Tylenol which helped her although she still did not attend group. Chart review shows her last Tylenol was administered yesterday at 1pm. Pt continues to isolate at times, and at times continues to show no motivation with treatment progress. She reports that her goal today is to open up more. Discussed with patient about setting goals she is able to obtain, opening up more can not be done if she remains in her room the majority of the day.  She reports passive suicidal intention at this time and is contracting for safety while on the unit only. No disruptive or agitated behaviors on unit . She reports poor sleep, despite taking Vistaril and Seroquel last night. She does request  that her dose be changed. As above, currently contracts for safety on the unit, but reports some ongoing passive SI. Reports auditory hallucinations last night and this morning, and does not appear internally preoccupied or psychotic .  Principal Problem: Major depressive disorder, single episode, severe without psychotic features ([place]) Diagnosis:   Patient Active Problem List   Diagnosis Date Noted  . Major depressive disorder, single episode, severe without psychotic features ([place]) [F32.2] 01/05/2016  . Severe episode of recurrent major depressive disorder, with psychotic features ([place]) [F33.3]   . MDD (major depressive disorder), recurrent episode, severe ([place]) [F33.2] 04/15/2015  . Hyperthyroidism [E05.90] 03/21/2015  . Tachycardia [R00.0] 03/21/2015  . Palpitations [R00.2] 03/21/2015  . Chest pain [R07.9] 03/21/2015  . MDD (major depressive disorder), recurrent episode, moderate ([place]) [F33.1] 03/16/2015  . Consultation for female sterilization [O11.57] 05/09/2013  . NSVD (normal spontaneous vaginal delivery) [O80] 03/30/2013  . Chronic hypertension in pregnancy [O10.919] 03/29/2013  . HTN in pregnancy, chronic [O10.919] 03/28/2013  . Transient hypertension of pregnancy, antepartum [O13.9] 03/20/2013  . GERD (gastroesophageal reflux disease) [K21.9] 01/18/2013  . Hyperemesis gravidarum before end of [redacted] week gestation with electrolyte imbalance [O21.1] 09/23/2012  . Trichomonas [A59.9] 09/23/2012  . Supervision of high-risk pregnancy [O09.90] 04/19/2011  . History of molar pregnancy, antepartum [O09.A0] 03/22/2011  . Hyperthyroidism, antepartum [W62.035, E07.9] 03/16/2011  . Sickle cell trait ([place]) [D57.3] 12/30/2010   Total  Time spent with patient: 20 minutes  Past Medical History:  Past Medical History:  Diagnosis Date  . Cholecystitis 08/18/2011  . Headache(784.0)    otc med prn  . History of recurrent UTIs    recurrent UTIs  . Hypertension   . Hyperthyroidism   . Joint  pain    knees and shoulders - otc med prn  . Sickle cell trait ([place])   . Sickle cell trait ([place])   . SVD (spontaneous vaginal delivery)    x 3 WH    Past Surgical History:  Procedure Laterality Date  . CHOLECYSTECTOMY  08/19/2011   Procedure: LAPAROSCOPIC CHOLECYSTECTOMY WITH INTRAOPERATIVE CHOLANGIOGRAM;  Surgeon: Madilyn Hook, DO;  Location: [facility];  Service: General;  Laterality: N/A;  . DILATION AND CURETTAGE OF UTERUS     molar preg  . LAPAROSCOPIC BILATERAL SALPINGECTOMY Bilateral 05/09/2013   Procedure: LAPAROSCOPIC BILATERAL SALPINGECTOMY;  Surgeon: Emily Filbert, MD;  Location: [facility];  Service: Gynecology;  Laterality: Bilateral;   Family History:  Family History  Problem Relation Age of Onset  . Diabetes Mother   . Cancer Father   . Diabetes Maternal Uncle   . Cancer Maternal Grandmother   . Cancer Maternal Grandfather   . Hypertension Maternal Grandfather   . Diabetes Maternal Grandfather   . Sickle cell anemia Son   . Asthma Son   . Heart disease Paternal Grandfather   . Hearing loss Paternal Aunt     Social History:  History  Alcohol Use No     History  Drug Use No    Social History   Social History  . Marital status: Single    Spouse name: N/A  . Number of children: N/A  . Years of education: N/A   Social History Main Topics  . Smoking status: Never Smoker  . Smokeless tobacco: Never Used  . Alcohol use No  . Drug use: No  . Sexual activity: Yes    Birth control/ protection: Surgical   Other Topics Concern  . None   Social History Narrative   2 Adults, 4 children at home   FOB involved   Additional Social History:    Pain Medications: denies Prescriptions: denies Over the Counter: denies History of alcohol / drug use?: No history of alcohol / drug abuse  Sleep: improving   Appetite:  Fair  Current Medications: Current Facility-Administered Medications  Medication Dose Route Frequency Provider Last Rate Last Dose  . acetaminophen  (TYLENOL) tablet 650 mg  650 mg Oral Q6H PRN [name], NP   650 mg at 01/10/16 1302  . alum & mag hydroxide-simeth (MAALOX/MYLANTA) 200-200-20 MG/5ML suspension 30 mL  30 mL Oral Q4H PRN [name], NP   30 mL at 01/10/16 1302  . buPROPion (WELLBUTRIN XL) 24 hr tablet 150 mg  150 mg Oral Daily [name], MD   150 mg at 01/11/16 0800  . citalopram (CELEXA) tablet 20 mg  20 mg Oral Daily [name], MD   20 mg at 01/11/16 0800  . hydrOXYzine (ATARAX/VISTARIL) tablet 50 mg  50 mg Oral Q8H PRN Jenne Campus, MD   50 mg at 01/09/16 2301  . magnesium hydroxide (MILK OF MAGNESIA) suspension 30 mL  30 mL Oral Daily PRN [name], NP      . methimazole (TAPAZOLE) tablet 20 mg  20 mg Oral BID [name], NP   20 mg at 01/11/16 0802  . metoprolol tartrate (LOPRESSOR) tablet  12.5 mg  12.5 mg Oral BID [name], NP   12.5 mg at 01/11/16 0800  . QUEtiapine (SEROQUEL) tablet 250 mg  250 mg Oral QHS Nanci Pina, FNP   250 mg at 01/10/16 2208    Lab Results:  No results found for this or any previous visit (from the past 48 hour(s)).  Blood Alcohol level:  Lab Results  Component Value Date   ETH <5 01/05/2016   ETH <5 60/60/0459    Metabolic Disorder Labs: Lab Results  Component Value Date   HGBA1C 4.9 01/09/2016   MPG 94 01/09/2016   MPG 108 04/17/2015   Lab Results  Component Value Date   PROLACTIN 35.2 (H) 01/09/2016   Lab Results  Component Value Date   CHOL 121 01/09/2016   TRIG 79 01/09/2016   HDL 37 (L) 01/09/2016   CHOLHDL 3.3 01/09/2016   VLDL 16 01/09/2016   LDLCALC 68 01/09/2016   LDLCALC 59 04/17/2015   Musculoskeletal: Strength & Muscle Tone: within normal limits Gait & Station: normal Patient leans: N/A  Psychiatric Specialty Exam: Physical Exam   ROS  does not endorse headache, chest pain or shortness of breath. No nausea or vomiting   Blood pressure 113/66, pulse 88, temperature 98.8 F (37.1 C), resp. rate 16, height 5'  5" (1.651 m), weight 73.5 kg (162 lb), last menstrual period 12/19/2015, SpO2 100 %.Body mass index is 26.96 kg/m.  General Appearance: Fairly Groomed  Eye Contact:  Improving   Speech:  Normal Rate  Volume:  Normal  Mood:  Depression, Anxiety  Affect:  remains constricted, more reactive, does smile briefly at times   Thought Process:  Linear  Orientation:  Full (Time, Place, and Person)  Thought Content:  Describes intermittent and fleeting visual hallucinations, described as shadows, does not appear internally preoccupied , no delusions are expressed   Suicidal Thoughts:  Yes.  without intent/plan    Homicidal Thoughts:  No denies any homicidal or violent ideations   Memory:  recent and remote grossly intact   Judgement:  Other:  improving   Insight:  Improving   Psychomotor Activity:  Normal  Concentration:  Concentration: Good and Attention Span: Good  Recall:  [place] of Knowledge:  Good  Language:  Good  Akathisia:  Negative  Handed:  Right  AIMS (if indicated):     Assets:  Desire for Improvement Social Support  ADL's:  Improved   Cognition:  Intact   Sleep:  Number of Hours: 5.25   Assessment - patient continues to present with some depression, constricted affect, social isolation . 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 Tolerating medications well at this time. No active suicidal ideations .   Treatment Plan Summary: Daily contact with patient to assess and evaluate symptoms and progress in treatment, Medication management, Plan inpatient admission  and medications as below Encourage ongoing group and milieu participation to work on coping skills and symptom reduction Increase Seroquel  250 mgrs  QHS for mood disorder, depression  Continue  Celexa  20 mgrs QDAY for depression  Continue Wellbutrin XL 150 mgrs QAM  for depression  Continue Vistaril  50 mgrs Q 8 hours PRN for anxiety  Treatment team working on disposition planning options   Nanci Pina, [place] 01/11/2016, 11:47 AM

## 2016-01-11 NOTE — Plan of Care (Signed)
Problem: Education: Goal: Knowledge of the prescribed therapeutic regimen will improve Outcome: Progressing Patient is taking her medications, both PRN and scheduled, in an appropriate manner

## 2016-01-11 NOTE — Progress Notes (Signed)
Writer spoke with patient 1:1 at medication window when she requested tylenol for a migraine headache. She reports that her headache has gotten worse and she had one earlier today. Writer asked if she reported this and she reported that her nurse was aware of this. She also reports passive si and contracts along with auditory hallucinations which she reports that the NP was aware of also. She attended group and has minimal to no interaction with peers. Tylenol received and support given. Safety maintained on unit with 15 min checks.

## 2016-01-11 NOTE — Progress Notes (Signed)
Adult Psychoeducational Group Note  Date:  01/11/2016 Time:  11:22 PM  Group Topic/Focus:  Wrap-Up Group:   The focus of this group is to help patients review their daily goal of treatment and discuss progress on daily workbooks.   Participation Level:  Active  Participation Quality:  Appropriate  Affect:  Appropriate  Cognitive:  Alert and Appropriate  Insight: Appropriate  Engagement in Group:  Engaged  Modes of Intervention:  Discussion  Additional Comments:  Patient attended group and said that her day was a 4.  Her goal was to attend all groups but she missed a few of them.  Her coping skill is listening to music.  Iridiana Fonner W Jerrold Haskell 01/11/2016, 11:22 PM

## 2016-01-11 NOTE — Progress Notes (Signed)
Writer spoke with patient 1:1and she reports that her day started off pretty rough earlier over a phone call that made her pretty upset but she calmed herself down. She reports that she feels that she may have some anger issues. She denies having or feeling light headed or dizzy today. Writer informed her of the seroquel increase and she was pleased. She has been up in the dayroom watching tv but little to no interaction with peers. Support and encouragement given, safety maintained on unit with 15 min checks.

## 2016-01-11 NOTE — [facility] Group Notes (Signed)
[facility] Group Notes:  (Nursing/MHT/Case Management/Adjunct)  Date:  01/11/2016  Time:  4:16 PM  Type of Therapy:  Psychoeducational Skills  Participation Level:  Did Not Attend  Participation Quality:  N/A  Affect:  N/A  Cognitive:  N/A  Insight:  None  Engagement in Group:  None  Modes of Intervention:  Discussion and Education  Summary of Progress/Problems: Patient was invited to group but did not attend.  

## 2016-01-12 DIAGNOSIS — Z808 Family history of malignant neoplasm of other organs or systems: Secondary | ICD-10-CM

## 2016-01-12 DIAGNOSIS — Z825 Family history of asthma and other chronic lower respiratory diseases: Secondary | ICD-10-CM

## 2016-01-12 DIAGNOSIS — Z822 Family history of deafness and hearing loss: Secondary | ICD-10-CM

## 2016-01-12 DIAGNOSIS — Z79899 Other long term (current) drug therapy: Secondary | ICD-10-CM

## 2016-01-12 DIAGNOSIS — Z833 Family history of diabetes mellitus: Secondary | ICD-10-CM

## 2016-01-12 DIAGNOSIS — R45851 Suicidal ideations: Secondary | ICD-10-CM

## 2016-01-12 DIAGNOSIS — Z8249 Family history of ischemic heart disease and other diseases of the circulatory system: Secondary | ICD-10-CM

## 2016-01-12 NOTE — Progress Notes (Signed)
D: Patient up and visible in the milieu. Spoke with patient 1:1. Rates sleep fair, appetite poor, energy low and concentration poor. Patient's affect flat and irritable with congruent mood. Rating her depression at a 9/10, hopelessness and anxiety at a 10/10. States goal for today is to "attend groups." Denies pain, physical problems.   A: Medicated per orders,no prns needed or requested. Emotional support offered and self inventory reviewed.   R:  Patient endorsing SI but verbally contracts for safety. No HI and remains safe on level III obs.

## 2016-01-12 NOTE — [facility] Group Notes (Signed)
[facility] LCSW Group Therapy  01/12/2016 1:15pm  Type of Therapy:  Group Therapy vercoming Obstacles  Participation Level:  Minimal  Participation Quality:  Appropriate   Affect:  N/A  Cognitive:  Appropriate and Oriented  Insight: Unable to assess  Engagement in Therapy:  Minimal  Modes of Intervention:  Discussion, Exploration, Problem-solving and Support  Description of Group:   In this group patients will be encouraged to explore what they see as obstacles to their own wellness and recovery. They will be guided to discuss their thoughts, feelings, and behaviors related to these obstacles. The group will process together ways to cope with barriers, with attention given to specific choices patients can make. Each patient will be challenged to identify changes they are motivated to make in order to overcome their obstacles. This group will be process-oriented, with patients participating in exploration of their own experiences as well as giving and receiving support and challenge from other group members.  Summary of Patient Progress: Pt only participated when prompted and discussed that she has no motivation to get better at this time because the effort to do so is overwhelming.   Therapeutic Modalities:   Cognitive Behavioral Therapy Solution Focused Therapy Motivational Interviewing Relapse Prevention Therapy   Chad CordialLauren Carter, LCSWA 01/12/2016 4:23 PM

## 2016-01-12 NOTE — Progress Notes (Signed)
Adult Psychoeducational Group Note  Date:  01/12/2016 Time:  10:23 PM  Group Topic/Focus:  Wrap-Up Group:   The focus of this group is to help patients review their daily goal of treatment and discuss progress on daily workbooks.   Participation Level:  Active  Participation Quality:  Appropriate  Affect:  Appropriate  Cognitive:  Alert  Insight: Appropriate  Engagement in Group:  Engaged  Modes of Intervention:  Discussion  Additional Comments:  Patient states, "I had a down day". Patient states she has been in bed all day. Patient's goal for today was to attend groups.  Ednamae Schiano L Coyle Stordahl 01/12/2016, 10:23 PM

## 2016-01-12 NOTE — Progress Notes (Signed)
[facility] MD Progress Note  01/12/2016 12:59 PM Kalyna CALEAH TORTORELLI  MRN:  [id number] Subjective: Patient complaining feeling worse, more depressed mood to the same level as the day she was admitted, blames this on conversation with boyfriend who says  Who acusses her of being here just to avoid the responsibility of caring for their 3 sons. She is also feeling suicidal  Objective :  30 year old female with history of MDD who was discharged from here 6 months ago, dis well on her medications and stopped taking them. She did well till her mother died last month ago and she developed multiple depressive symptoms including suicide ideatons.  Spoke with patient regarding the fact that boyfirend may not have enough knowledge about mental illness to understand what is going on with her and suggest [facility] for her as a support group      Principal Problem: Major depressive disorder, single episode, severe without psychotic features (HCC) Diagnosis:   Patient Active Problem List   Diagnosis Date Noted  . Major depressive disorder, single episode, severe without psychotic features (HCC) [F32.2] 01/05/2016  . 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 MDD (major depressive disorder), recurrent episode, severe (HCC) [F33.2] 04/15/2015  . Hyperthyroidism [E05.90] 03/21/2015  . Tachycardia [R00.0] 03/21/2015  . Palpitations [R00.2] 03/21/2015  . Chest pain [R07.9] 03/21/2015  . MDD (major depressive disorder), recurrent episode, moderate (HCC) [F33.1] 03/16/2015  . Consultation for female sterilization [Z30.09] 05/09/2013  . NSVD (normal spontaneous vaginal delivery) [O80] 03/30/2013  . Chronic hypertension in pregnancy [O10.919] 03/29/2013  . HTN in pregnancy, chronic [O10.919] 03/28/2013  . Transient hypertension of pregnancy, antepartum [O13.9] 03/20/2013  . GERD (gastroesophageal reflux disease) [K21.9] 01/18/2013  . Hyperemesis  gravidarum before end of [redacted] week gestation with electrolyte imbalance [O21.1] 09/23/2012  . Trichomonas [A59.9] 09/23/2012  . Supervision of high-risk pregnancy [O09.90] 04/19/2011  . History of molar pregnancy, antepartum [O09.A0] 03/22/2011  . Hyperthyroidism, antepartum [W09.811, E07.9] 03/16/2011  . Sickle cell trait (HCC) [D57.3] 12/30/2010   Total Time spent with patient: 20 minutes  Past Medical History:  Past Medical History:  Diagnosis Date  . Cholecystitis 08/18/2011  . Headache(784.0)    otc med prn  . History of recurrent UTIs    recurrent UTIs  . Hypertension   . Hyperthyroidism   . Joint pain    knees and shoulders - otc med prn  . Sickle cell trait (HCC)   . Sickle cell trait (HCC)   . SVD (spontaneous vaginal delivery)    x 3 [facility]    Past Surgical History:  Procedure Laterality Date  . CHOLECYSTECTOMY  08/19/2011   Procedure: LAPAROSCOPIC CHOLECYSTECTOMY WITH INTRAOPERATIVE CHOLANGIOGRAM;  Surgeon: Lodema Pilot, DO;  Location: [facility] OR;  Service: General;  Laterality: N/A;  . DILATION AND CURETTAGE OF UTERUS     molar preg  . LAPAROSCOPIC BILATERAL SALPINGECTOMY Bilateral 05/09/2013   Procedure: LAPAROSCOPIC BILATERAL SALPINGECTOMY;  Surgeon: Allie Bossier, MD;  Location: [facility] ORS;  Service: Gynecology;  Laterality: Bilateral;   Family History:  Family History  Problem Relation Age of Onset  . Diabetes Mother   . Cancer Father   . Diabetes Maternal Uncle   . Cancer Maternal Grandmother   . Cancer Maternal Grandfather   . Hypertension Maternal Grandfather   . Diabetes Maternal Grandfather   . Sickle cell anemia Son   . Asthma Son   . Heart disease Paternal Grandfather   .  Hearing loss Paternal Aunt     Social History:  History  Alcohol Use No     History  Drug Use No    Social History   Social History  . Marital status: Single    Spouse name: N/A  . Number of children: N/A  . Years of education: N/A   Social History Main Topics  . Smoking status:  Never Smoker  . Smokeless tobacco: Never Used  . Alcohol use No  . Drug use: No  . Sexual activity: Yes    Birth control/ protection: Surgical   Other Topics Concern  . None   Social History Narrative   2 Adults, 4 children at home   FOB involved   Additional Social History:    Pain Medications: denies Prescriptions: denies Over the Counter: denies History of alcohol / drug use?: No history of alcohol / drug abuse  Sleep: improving   Appetite:  Fair  Current Medications: Current Facility-Administered Medications  Medication Dose Route Frequency Provider Last Rate Last Dose  . acetaminophen (TYLENOL) tablet 650 mg  650 mg Oral Q6H PRN Charm RingsJamison Y Lord, NP   650 mg at 01/11/16 2038  . alum & mag hydroxide-simeth (MAALOX/MYLANTA) 200-200-20 MG/5ML suspension 30 mL  30 mL Oral Q4H PRN Charm RingsJamison Y Lord, NP   30 mL at 01/11/16 1316  . buPROPion (WELLBUTRIN XL) 24 hr tablet 150 mg  150 mg Oral Daily Craige CottaFernando A Cobos, MD   150 mg at 01/12/16 0756  . citalopram (CELEXA) tablet 20 mg  20 mg Oral Daily Craige CottaFernando A Cobos, MD   20 mg at 01/12/16 0756  . hydrOXYzine (ATARAX/VISTARIL) tablet 50 mg  50 mg Oral Q8H PRN Craige CottaFernando A Cobos, MD   50 mg at 01/11/16 1316  . magnesium hydroxide (MILK OF MAGNESIA) suspension 30 mL  30 mL Oral Daily PRN Charm RingsJamison Y Lord, NP   30 mL at 01/11/16 1621  . methimazole (TAPAZOLE) tablet 20 mg  20 mg Oral BID Charm RingsJamison Y Lord, NP   20 mg at 01/12/16 0756  . metoprolol tartrate (LOPRESSOR) tablet 12.5 mg  12.5 mg Oral BID Charm RingsJamison Y Lord, NP   12.5 mg at 01/12/16 0756  . QUEtiapine (SEROQUEL) tablet 250 mg  250 mg Oral QHS Truman Haywardakia S Starkes, FNP   250 mg at 01/11/16 2159    Lab Results:  No results found for this or any previous visit (from the past 48 hour(s)).  Blood Alcohol level:  Lab Results  Component Value Date   ETH <5 01/05/2016   ETH <5 04/15/2015    Metabolic Disorder Labs: Lab Results  Component Value Date   HGBA1C 4.9 01/09/2016   MPG 94  01/09/2016   MPG 108 04/17/2015   Lab Results  Component Value Date   PROLACTIN 35.2 (H) 01/09/2016   Lab Results  Component Value Date   CHOL 121 01/09/2016   TRIG 79 01/09/2016   HDL 37 (L) 01/09/2016   CHOLHDL 3.3 01/09/2016   VLDL 16 01/09/2016   LDLCALC 68 01/09/2016   LDLCALC 59 04/17/2015   Musculoskeletal: Strength & Muscle Tone: within normal limits Gait & Station: normal Patient leans: N/A  Psychiatric Specialty Exam: Physical Exam  ROS does not endorse headache, chest pain or shortness of breath. No nausea or vomiting   Blood pressure 99/67, pulse 89, temperature 97.9 F (36.6 C), temperature source Oral, resp. rate 16, height 5\' 5"  (1.651 m), weight 73.5 kg (162 lb), last menstrual period 12/19/2015, SpO2 100 %.  Body mass index is 26.96 kg/m.  General Appearance: Fairly Groomed  Eye Contact:  Improving   Speech:  Normal Rate  Volume:  Normal  Mood:  Depression, Anxiety  Affect:  remains constricted, more reactive, does smile briefly at times   Thought Process:  Linear  Orientation:  Full (Time, Place, and Person)  Thought Content:  Describes intermittent and fleeting visual hallucinations, described as shadows, does not appear internally preoccupied , no delusions are expressed   Suicidal Thoughts:  Yes.  without intent/plan    Homicidal Thoughts:  No denies any homicidal or violent ideations   Memory:  recent and remote grossly intact   Judgement:  Other:  improving   Insight:  Improving   Psychomotor Activity:  Normal  Concentration:  Concentration: Good and Attention Span: Good  Recall:  Fair  Fund of Knowledge:  Good  Language:  Good  Akathisia:  Negative  Handed:  Right  AIMS (if indicated):     Assets:  Desire for Improvement Social Support  ADL's:  Improved   Cognition:  Intact   Sleep:  Number of Hours: 5.75   Assessment - patient continues to present with some depression, constricted affect, social isolation . 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 Tolerating medications well at this time. No active suicidal ideations .   Treatment Plan Summary: Daily contact with patient to assess and evaluate symptoms and progress in treatment, Medication management, Plan inpatient admission  and medications as below Encourage ongoing group and milieu participation to work on coping skills and symptom reduction Increase Seroquel  250 mgrs  QHS for mood disorder, depression  Continue  Celexa  20 mgrs QDAY for depression  Continue Wellbutrin XL 150 mgrs QAM for depression  Continue Vistaril  50 mgrs Q 8 hours PRN for anxiety  Treatment team working on disposition planning options   Wynelle Bourgeois, MD 01/12/2016, 12:59 PM

## 2016-01-12 NOTE — Plan of Care (Signed)
Problem: Safety: Goal: Periods of time without injury will increase Outcome: Progressing Patient has not engaged in self harm however endorses passive SI. Verbal contract for safety.  Problem: Medication: Goal: Compliance with prescribed medication regimen will improve Outcome: Progressing Patient is med compliant.

## 2016-01-13 MED ORDER — QUETIAPINE FUMARATE 25 MG PO TABS
25.0000 mg | ORAL_TABLET | Freq: Every day | ORAL | Status: DC
  Administered 2016-01-13 – 2016-01-16 (×4): 25 mg via ORAL
  Filled 2016-01-13 (×7): qty 1

## 2016-01-13 NOTE — Progress Notes (Signed)
D: Pt endorses moderate anxiety and depression; states, "I got a bad call from my kids father; he said if I want to die, I should just go do it." Pt was observed in the dayroom withdrawn to self. Pt also endorses passive SI however, contracts for safety. Pt remained calm and cooperative. A: Medications offered as prescribed.  Support, encouragement, and safe environment provided.  15-minute safety checks continue. R: Pt was med compliant.  Pt attended wrap-up group. Safety checks continue.

## 2016-01-13 NOTE — [facility] Group Notes (Signed)
[facility] Group Notes:  (Nursing/MHT/Case Management/Adjunct)  Date:  01/13/2016  Time:  0900 am  Type of Therapy:  Orientation Group  Participation Level:  Did Not Attend  Invited; declined to attend.  Cranford MonBeaudry, Clariece Roesler Evans 01/13/2016, 10:55 AM

## 2016-01-13 NOTE — Progress Notes (Signed)
Patient ID: Grace [name], female   DOB: 12/23/1985, 30 y.o.   MRN: [id number] D: Patient continues to report anxiety and depression.  She has thoughts of self harm with no specific plan.  Patient rates her depression and anxiety as a 10; hopelessness as a 8.  She is compliant with medications and is observed interacting with her peers.  She has flat, blunted affect and her mood is sad and depressed.  She has attended some groups with participation.   A: Continue to monitor medication management and MD orders.  Safety checks completed every 15 minutes per protocol.   R: Patient is receptive to staff; her behavior is appropriate.

## 2016-01-13 NOTE — [facility] Group Notes (Signed)
Grace [name] was invited to join group on Grief and Loss.  She did not attend group.  She entered group room after group was finished and took sat with other patients, but did not engage.    Grace [name], Ella Golomb Wayne MDiv

## 2016-01-13 NOTE — [facility] Group Notes (Signed)
[facility] LCSW Group Therapy 01/13/2016 1:15 PM  Type of Therapy: Group Therapy- Feelings about Diagnosis  Participation Level: Minimal  Participation Quality:  N/A  Affect:  Flat, Irritable  Cognitive: Alert and Oriented   Insight:  Unable to assess  Engagement in Therapy: Minimal  Modes of Intervention: Clarification, Confrontation, Discussion, Education, Exploration, Limit-setting, Orientation, Problem-solving, Rapport Building, Dance movement psychotherapisteality Testing, Socialization and Support  Description of Group:   This group will allow patients to explore their thoughts and feelings about diagnoses they have received. Patients will be guided to explore their level of understanding and acceptance of these diagnoses. Facilitator will encourage patients to process their thoughts and feelings about the reactions of others to their diagnosis, and will guide patients in identifying ways to discuss their diagnosis with significant others in their lives. This group will be process-oriented, with patients participating in exploration of their own experiences as well as giving and receiving support and challenge from other group members.  Summary of Progress/Problems:  Pt did not participate in group discussion and continues to appear flat and irritable.   Therapeutic Modalities:   Cognitive Behavioral Therapy Solution Focused Therapy Motivational Interviewing Relapse Prevention Therapy  Chad CordialLauren Carter, LCSWA 01/13/2016 3:47 PM

## 2016-01-13 NOTE — [facility] Group Notes (Signed)
Adult Psychoeducational Group Note  Date:  01/13/2016 Time:  9:41 PM  Group Topic/Focus:  Wrap-Up Group:   The focus of this group is to help patients review their daily goal of treatment and discuss progress on daily workbooks.   Participation Level:  Active  Participation Quality:  Appropriate  Affect:  Appropriate  Cognitive:  Appropriate  Insight: Appropriate  Engagement in Group:  Engaged  Modes of Intervention:  Discussion  Additional Comments:  Pt stated that her goal was to attend group. Pt stated that she accomplished that goal. Pt stated that she was feeling anxious and that her day was a 4 out of 10 due to her anxiety. Delia ChimesRufus, Natnael Biederman L 01/13/2016, 9:41 PM

## 2016-01-13 NOTE — Progress Notes (Signed)
[facility] MD Progress Note  01/13/2016 11:16 AM Grace [name]  MRN:  [id number] Subjective: Patient complaining stillfeeling and suicidal but more anxious today and continues blames this on conversation with boyfriend who says  Who acusses her of being here just to avoid the responsibility of caring for their 3 sons. Suicidal ideation on admission was a 10 and now a 7  Objective :  30 year old female with history of MDD who was discharged from here 6 months ago, dis well on her medications and stopped taking them. She did well till her mother died last month ago and she developed multiple depressive symptoms including suicide ideatons.  Spoke with patient regarding the fact that boyfirend may not have enough knowledge about mental illness to understand what is going on with her and suggest [employer] for her as a support group      Principal Problem: Major depressive disorder, single episode, severe without psychotic features (HCC) Diagnosis:   Patient Active Problem List   Diagnosis Date Noted  . Major depressive disorder, single episode, severe without psychotic features (HCC) [F32.2] 01/05/2016  . 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 MDD (major depressive disorder), recurrent episode, severe (HCC) [F33.2] 04/15/2015  . Hyperthyroidism [E05.90] 03/21/2015  . Tachycardia [R00.0] 03/21/2015  . Palpitations [R00.2] 03/21/2015  . Chest pain [R07.9] 03/21/2015  . MDD (major depressive disorder), recurrent episode, moderate (HCC) [F33.1] 03/16/2015  . Consultation for female sterilization [Z30.09] 05/09/2013  . NSVD (normal spontaneous vaginal delivery) [O80] 03/30/2013  . Chronic hypertension in pregnancy [O10.919] 03/29/2013  . HTN in pregnancy, chronic [O10.919] 03/28/2013  . Transient hypertension of pregnancy, antepartum [O13.9] 03/20/2013  . GERD (gastroesophageal reflux disease) [K21.9] 01/18/2013  . Hyperemesis  gravidarum before end of [redacted] week gestation with electrolyte imbalance [O21.1] 09/23/2012  . Trichomonas [A59.9] 09/23/2012  . Supervision of high-risk pregnancy [O09.90] 04/19/2011  . History of molar pregnancy, antepartum [O09.A0] 03/22/2011  . Hyperthyroidism, antepartum [F62.130, E07.9] 03/16/2011  . Sickle cell trait (HCC) [D57.3] 12/30/2010   Total Time spent with patient: 20 minutes  Past Medical History:  Past Medical History:  Diagnosis Date  . Cholecystitis 08/18/2011  . Headache(784.0)    otc med prn  . History of recurrent UTIs    recurrent UTIs  . Hypertension   . Hyperthyroidism   . Joint pain    knees and shoulders - otc med prn  . Sickle cell trait (HCC)   . Sickle cell trait (HCC)   . SVD (spontaneous vaginal delivery)    x 3 WH    Past Surgical History:  Procedure Laterality Date  . CHOLECYSTECTOMY  08/19/2011   Procedure: LAPAROSCOPIC CHOLECYSTECTOMY WITH INTRAOPERATIVE CHOLANGIOGRAM;  Surgeon: Lodema Pilot, DO;  Location: [facility] OR;  Service: General;  Laterality: N/A;  . DILATION AND CURETTAGE OF UTERUS     molar preg  . LAPAROSCOPIC BILATERAL SALPINGECTOMY Bilateral 05/09/2013   Procedure: LAPAROSCOPIC BILATERAL SALPINGECTOMY;  Surgeon: Allie Bossier, MD;  Location: [facility] OR[facility];  Service: Gynecology;  Laterality: Bilateral;   Family History:  Family History  Problem Relation Age of Onset  . Diabetes Mother   . Cancer Father   . Diabetes Maternal Uncle   . Cancer Maternal Grandmother   . Cancer Maternal Grandfather   . Hypertension Maternal Grandfather   . Diabetes Maternal Grandfather   . Sickle cell anemia Son   . Asthma Son   . Heart disease Paternal Grandfather   .  Hearing loss Paternal Aunt     Social History:  History  Alcohol Use No     History  Drug Use No    Social History   Social History  . Marital status: Single    Spouse name: N/A  . Number of children: N/A  . Years of education: N/A   Social History Main Topics  . Smoking status:  Never Smoker  . Smokeless tobacco: Never Used  . Alcohol use No  . Drug use: No  . Sexual activity: Yes    Birth control/ protection: Surgical   Other Topics Concern  . None   Social History Narrative   2 Adults, 4 children at home   FOB involved   Additional Social History:    Pain Medications: denies Prescriptions: denies Over the Counter: denies History of alcohol / drug use?: No history of alcohol / drug abuse  Sleep: improving   Appetite:  Fair  Current Medications: Current Facility-Administered Medications  Medication Dose Route Frequency Provider Last Rate Last Dose  . acetaminophen (TYLENOL) tablet 650 mg  650 mg Oral Q6H PRN Charm Rings, NP   650 mg at 01/11/16 2038  . alum & mag hydroxide-simeth (MAALOX/MYLANTA) 200-200-20 MG/5ML suspension 30 mL  30 mL Oral Q4H PRN Charm Rings, NP   30 mL at 01/11/16 1316  . buPROPion (WELLBUTRIN XL) 24 hr tablet 150 mg  150 mg Oral Daily Craige Cotta, MD   150 mg at 01/13/16 0803  . citalopram (CELEXA) tablet 20 mg  20 mg Oral Daily Craige Cotta, MD   20 mg at 01/13/16 0803  . hydrOXYzine (ATARAX/VISTARIL) tablet 50 mg  50 mg Oral Q8H PRN Craige Cotta, MD   50 mg at 01/12/16 2150  . magnesium hydroxide (MILK OF MAGNESIA) suspension 30 mL  30 mL Oral Daily PRN Charm Rings, NP   30 mL at 01/11/16 1621  . methimazole (TAPAZOLE) tablet 20 mg  20 mg Oral BID Charm Rings, NP   20 mg at 01/13/16 0803  . metoprolol tartrate (LOPRESSOR) tablet 12.5 mg  12.5 mg Oral BID Charm Rings, NP   Stopped at 01/13/16 [phone]  . QUEtiapine (SEROQUEL) tablet 250 mg  250 mg Oral QHS Truman Hayward, FNP   250 mg at 01/12/16 2150    Lab Results:  No results found for this or any previous visit (from the past 48 hour(s)).  Blood Alcohol level:  Lab Results  Component Value Date   ETH <5 01/05/2016   ETH <5 04/15/2015    Metabolic Disorder Labs: Lab Results  Component Value Date   HGBA1C 4.9 01/09/2016   MPG 94  01/09/2016   MPG 108 04/17/2015   Lab Results  Component Value Date   PROLACTIN 35.2 (H) 01/09/2016   Lab Results  Component Value Date   CHOL 121 01/09/2016   TRIG 79 01/09/2016   HDL 37 (L) 01/09/2016   CHOLHDL 3.3 01/09/2016   VLDL 16 01/09/2016   LDLCALC 68 01/09/2016   LDLCALC 59 04/17/2015   Musculoskeletal: Strength & Muscle Tone: within normal limits Gait & Station: normal Patient leans: N/A  Psychiatric Specialty Exam: Physical Exam  ROS does not endorse headache, chest pain or shortness of breath. No nausea or vomiting   Blood pressure 105/74, pulse 87, temperature 98.5 F (36.9 C), temperature source Oral, resp. rate 16, height 5\' 5"  (1.651 m), weight 73.5 kg (162 lb), last menstrual period 12/19/2015, SpO2 100 %.Body  mass index is 26.96 kg/m.  General Appearance: Fairly Groomed  Eye Contact:  Improving   Speech:  Normal Rate  Volume:  Normal  Mood:  Depression, Anxiety  Affect:  remains constricted, more reactive, does smile briefly at times   Thought Process:  Linear  Orientation:  Full (Time, Place, and Person)  Thought Content:  Describes intermittent and fleeting visual hallucinations, described as shadows, does not appear internally preoccupied , no delusions are expressed   Suicidal Thoughts:  Yes.  without intent/plan    Homicidal Thoughts:  No denies any homicidal or violent ideations   Memory:  recent and remote grossly intact   Judgement:  Other:  improving   Insight:  Improving   Psychomotor Activity:  Normal  Concentration:  Concentration: Good and Attention Span: Good  Recall:  Fair  Fund of Knowledge:  Good  Language:  Good  Akathisia:  Negative  Handed:  Right  AIMS (if indicated):     Assets:  Desire for Improvement Social Support  ADL's:  Improved   Cognition:  Intact   Sleep:  Number of Hours: 6.5   Assessment - patient continues to present with some depression, constricted affect, social isolation . 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 Tolerating medications well at this time. No active suicidal ideations .   Treatment Plan Summary: Daily contact with patient to assess and evaluate symptoms and progress in treatment, Medication management, Plan inpatient admission  and medications as below Encourage ongoing group and milieu participation to work on coping skills and symptom reduction Increase Seroquel  250 mgrs  QHS and 25mg  amfor mood disorder, depression  Continue  Celexa  20 mgrs QDAY for depression  Continue Wellbutrin XL 150 mgrs QAM for depression  Continue Vistaril  50 mgrs Q 8 hours PRN for anxiety  Treatment team working on disposition planning options   Wynelle BourgeoisUreh N Lekauwa, MD 01/13/2016, 11:16 AM

## 2016-01-14 MED ORDER — CITALOPRAM HYDROBROMIDE 10 MG PO TABS
30.0000 mg | ORAL_TABLET | Freq: Every day | ORAL | Status: DC
  Administered 2016-01-15 – 2016-01-16 (×2): 30 mg via ORAL
  Filled 2016-01-14 (×4): qty 3

## 2016-01-14 NOTE — [facility] Group Notes (Signed)
[facility] LCSW Group Therapy 01/14/2016 1:15 PM  Type of Therapy: Group Therapy- Emotion Regulation  Participation Level: Minimal  Participation Quality:  Appropriate  Affect: Flat, Irritable  Cognitive: Alert and Oriented   Insight:  Improving  Engagement in Therapy: Minimal  Modes of Intervention: Clarification, Confrontation, Discussion, Education, Exploration, Limit-setting, Orientation, Problem-solving, Rapport Building, Dance movement psychotherapisteality Testing, Socialization and Support  Summary of Progress/Problems: The topic for group today was emotional regulation. This group focused on both positive and negative emotion identification and allowed group members to process ways to identify feelings, regulate negative emotions, and find healthy ways to manage internal/external emotions. Group members were asked to reflect on a time when their reaction to an emotion led to a negative outcome and explored how alternative responses using emotion regulation would have benefited them. Group members were also asked to discuss a time when emotion regulation was utilized when a negative emotion was experienced. Pt was reserved in group discussion and continues to participate minimal. Pt was receptive to emotion regulation handout.    Grace CordialLauren Carter, LCSWA 01/14/2016 3:27 PM

## 2016-01-14 NOTE — [facility] Group Notes (Signed)
Pt attended group today. Pt expressed that she has been dealing with anxiety the last two day. Pt stated she met her goal, "Attend groups today".

## 2016-01-14 NOTE — Plan of Care (Signed)
Problem: Safety: Goal: Periods of time without injury will increase Outcome: Progressing Pt. remains a low fall risk, denies SI/HI at this time, Q 15 checks in place for safety.    

## 2016-01-14 NOTE — Plan of Care (Signed)
Problem: Self-Concept: Goal: Level of anxiety will decrease Outcome: Progressing Client level of anxiety will decrease AEB reports being out of the room and interacting with others. Client reports anxiety "5" of 10.

## 2016-01-14 NOTE — Tx Team (Signed)
Interdisciplinary Treatment and Diagnostic Plan Update  01/14/2016 Time of Session: 11:16 AM  Grace [name] MRN: [id number]  Principal Diagnosis: Major depressive disorder, single episode, severe without psychotic features (HCC)  Secondary Diagnoses: Principal Problem:   Major depressive disorder, single episode, severe without psychotic features (HCC)   Current Medications:  Current Facility-Administered Medications  Medication Dose Route Frequency Provider Last Rate Last Dose  . acetaminophen (TYLENOL) tablet 650 mg  650 mg Oral Q6H PRN Charm Rings, NP   650 mg at 01/13/16 1155  . alum & mag hydroxide-simeth (MAALOX/MYLANTA) 200-200-20 MG/5ML suspension 30 mL  30 mL Oral Q4H PRN Charm Rings, NP   30 mL at 01/11/16 1316  . buPROPion (WELLBUTRIN XL) 24 hr tablet 150 mg  150 mg Oral Daily Craige Cotta, MD   150 mg at 01/14/16 0832  . [START ON 01/15/2016] citalopram (CELEXA) tablet 30 mg  30 mg Oral Daily Molinda Bailiff, MD      . hydrOXYzine (ATARAX/VISTARIL) tablet 50 mg  50 mg Oral Q8H PRN Craige Cotta, MD   50 mg at 01/12/16 2150  . magnesium hydroxide (MILK OF MAGNESIA) suspension 30 mL  30 mL Oral Daily PRN Charm Rings, NP   30 mL at 01/11/16 1621  . methimazole (TAPAZOLE) tablet 20 mg  20 mg Oral BID Charm Rings, NP   20 mg at 01/14/16 5784  . metoprolol tartrate (LOPRESSOR) tablet 12.5 mg  12.5 mg Oral BID Charm Rings, NP   12.5 mg at 01/14/16 6962  . QUEtiapine (SEROQUEL) tablet 25 mg  25 mg Oral Daily Molinda Bailiff, MD   25 mg at 01/14/16 0832  . QUEtiapine (SEROQUEL) tablet 250 mg  250 mg Oral QHS Truman Hayward, FNP   250 mg at 01/13/16 2226    PTA Medications: Prescriptions Prior to Admission  Medication Sig Dispense Refill Last Dose  . buPROPion (WELLBUTRIN XL) 300 MG 24 hr tablet Take 1 tablet (300 mg total) by mouth daily. (Patient not taking: Reported on 01/06/2016) 30 tablet 0 Not Taking at Unknown time  . citalopram (CELEXA) 40 MG tablet  Take 1 tablet (40 mg total) by mouth daily. (Patient not taking: Reported on 01/06/2016) 30 tablet 0 Not Taking at Unknown time  . hydrOXYzine (ATARAX/VISTARIL) 25 MG tablet Take 1 tablet (25 mg total) by mouth every 6 (six) hours as needed for anxiety. (Patient not taking: Reported on 01/06/2016) 30 tablet 0 Not Taking at Unknown time  . methimazole (TAPAZOLE) 10 MG tablet Take 2 tablets (20 mg total) by mouth 2 (two) times daily. For hyperthyroidim (Patient not taking: Reported on 01/06/2016) 120 tablet 3 Not Taking at Unknown time  . metoprolol tartrate (LOPRESSOR) 25 MG tablet Take 0.5 tablets (12.5 mg total) by mouth 2 (two) times daily. For high blood pressure (Patient not taking: Reported on 01/06/2016) 30 tablet 0 Not Taking at Unknown time  . naproxen (NAPROSYN) 500 MG tablet Take 1 tablet (500 mg total) by mouth 2 (two) times daily. (Patient not taking: Reported on 01/06/2016) 20 tablet 0 Not Taking at Unknown time  . pseudoephedrine (SUDAFED) 60 MG tablet Take 1 tablet (60 mg total) by mouth every 4 (four) hours as needed for congestion. (Patient not taking: Reported on 01/06/2016) 15 tablet 0 Not Taking at Unknown time  . QUEtiapine (SEROQUEL) 200 MG tablet Take 1 tablet (200 mg total) by mouth at bedtime. (Patient not taking: Reported on 01/06/2016) 30 tablet 0 Not  Taking at Unknown time  . QUEtiapine (SEROQUEL) 25 MG tablet Take 1 tablet (25 mg total) by mouth 3 (three) times daily. (Patient not taking: Reported on 01/06/2016) 90 tablet 0 Not Taking at Unknown time    Treatment Modalities: Medication Management, Group therapy, Case management,  1 to 1 session with clinician, Psychoeducation, Recreational therapy.   Physician Treatment Plan for Primary Diagnosis: Major depressive disorder, single episode, severe without psychotic features (HCC) Long Term Goal(s): Improvement in symptoms so as ready for discharge  Short Term Goals: Ability to identify changes in lifestyle to reduce recurrence of  condition will improve, Ability to verbalize feelings will improve, Ability to disclose and discuss suicidal ideas, Ability to demonstrate self-control will improve, Ability to identify and develop effective coping behaviors will improve, Ability to maintain clinical measurements within normal limits will improve, Compliance with prescribed medications will improve and Ability to identify triggers associated with substance abuse/mental health issues will improve  Medication Management: Evaluate patient's response, side effects, and tolerance of medication regimen.  Therapeutic Interventions: 1 to 1 sessions, Unit Group sessions and Medication administration.  Evaluation of Outcomes: Progressing  Physician Treatment Plan for Secondary Diagnosis: Principal Problem:   Major depressive disorder, single episode, severe without psychotic features (HCC)   Long Term Goal(s): Improvement in symptoms so as ready for discharge  Short Term Goals: Ability to identify changes in lifestyle to reduce recurrence of condition will improve, Ability to verbalize feelings will improve, Ability to disclose and discuss suicidal ideas, Ability to demonstrate self-control will improve, Ability to identify and develop effective coping behaviors will improve, Ability to maintain clinical measurements within normal limits will improve, Compliance with prescribed medications will improve and Ability to identify triggers associated with substance abuse/mental health issues will improve  Medication Management: Evaluate patient's response, side effects, and tolerance of medication regimen.  Therapeutic Interventions: 1 to 1 sessions, Unit Group sessions and Medication administration.  Evaluation of Outcomes: Progressing   RN Treatment Plan for Primary Diagnosis: Major depressive disorder, single episode, severe without psychotic features (HCC) Long Term Goal(s): Knowledge of disease and therapeutic regimen to maintain  health will improve  Short Term Goals: Ability to verbalize feelings will improve, Ability to disclose and discuss suicidal ideas and Ability to identify and develop effective coping behaviors will improve  Medication Management: RN will administer medications as ordered by provider, will assess and evaluate patient's response and provide education to patient for prescribed medication. RN will report any adverse and/or side effects to prescribing provider.  Therapeutic Interventions: 1 on 1 counseling sessions, Psychoeducation, Medication administration, Evaluate responses to treatment, Monitor vital signs and CBGs as ordered, Perform/monitor CIWA, COWS, AIMS and Fall Risk screenings as ordered, Perform wound care treatments as ordered.  Evaluation of Outcomes: Progressing   LCSW Treatment Plan for Primary Diagnosis: Major depressive disorder, single episode, severe without psychotic features (HCC) Long Term Goal(s): Safe transition to appropriate next level of care at discharge, Engage patient in therapeutic group addressing interpersonal concerns.  Short Term Goals: Engage patient in aftercare planning with referrals and resources, Increase emotional regulation, Identify triggers associated with mental health/substance abuse issues and Increase skills for wellness and recovery  Therapeutic Interventions: Assess for all discharge needs, 1 to 1 time with Social worker, Explore available resources and support systems, Assess for adequacy in community support network, Educate family and significant other(s) on suicide prevention, Complete Psychosocial Assessment, Interpersonal group therapy.  Evaluation of Outcomes: Progressing   Progress in Treatment: Attending groups: Yes Participating  in groups: Minimally Taking medication as prescribed: Yes, MD continues to assess for medication changes as needed Toleration medication: Yes, no side effects reported at this time Family/Significant other  contact made: No, Pt declines Patient understands diagnosis: Developing insight Discussing patient identified problems/goals with staff: Yes Medical problems stabilized or resolved: Yes Denies suicidal/homicidal ideation: Yes Issues/concerns per patient self-inventory: None Other: N/A  New problem(s) identified: None identified at this time.   New Short Term/Long Term Goal(s): None identified at this time.   Discharge Plan or Barriers: Pt will return home and follow-up with outpatient services.   Reason for Continuation of Hospitalization: Depression Hallucinations Medication stabilization Suicidal ideation  Estimated Length of Stay: 1-2 days  Attendees: Patient: 01/14/2016  11:16 AM  Physician: Dr. Seth Bake 01/14/2016  11:16 AM  Nursing: Liborio Nixon, RN 01/14/2016  11:16 AM  RN Care Manager: Onnie Boer, RN 01/14/2016  11:16 AM  Social Worker: Chad Cordial, LCSW; Kristin Drinkard, LCSW 01/14/2016  11:16 AM  Recreational Therapist:  01/14/2016  11:16 AM  Other:  01/14/2016  11:16 AM  Other:  01/14/2016  11:16 AM  Other: 01/14/2016  11:16 AM    Scribe for Treatment Team: Elaina Hoops, LCSW 01/14/2016 11:16 AM

## 2016-01-14 NOTE — Progress Notes (Signed)
[facility] MD Progress Note  01/14/2016 10:09 AM [place]  MRN:  [id number] Subjective: Met with treatment team about patient and met with patient who reports feeling suicidal last night but is just waking up and not feeling suicidal now.Patient states her depression 6-7/10 and same for anxiety.. Will need to increase citalopram to 3m daily and  observe patient today to assure suicide ideation is in remission before discharge.  Objective :  30year old female with history of MDD who was discharged from here 6 months ago, did well on her medications and stopped taking them. She did well till her mother died last month ago and she developed multiple depressive symptoms including suicide ideatons.  Spoke with patient regarding the fact that boyfirend may not have enough knowledge about mental illness to understand what is going on with her and [place] support [place]      Principal Problem: Major depressive disorder, single episode, severe without psychotic features ([place] Diagnosis:   Patient Active Problem List   Diagnosis Date Noted  . Major depressive disorder, single episode, severe without psychotic features ([place] [F32.2] 01/05/2016  . Severe episode of recurrent major depressive disorder, with psychotic features ([place] [F33.3]   . MDD (major depressive disorder), recurrent episode, severe ([place] [F33.2] 04/15/2015  . Hyperthyroidism [E05.90] 03/21/2015  . Tachycardia [R00.0] 03/21/2015  . Palpitations [R00.2] 03/21/2015  . Chest pain [R07.9] 03/21/2015  . MDD (major depressive disorder), recurrent episode, moderate ([place] [F33.1] 03/16/2015  . Consultation for female sterilization [[K02.54]05/09/2013  . NSVD (normal spontaneous vaginal delivery) [O80] 03/30/2013  . Chronic hypertension in pregnancy [O10.919] 03/29/2013  . HTN in pregnancy, chronic [O10.919] 03/28/2013  . Transient hypertension of pregnancy, antepartum [O13.9] 03/20/2013  . GERD  (gastroesophageal reflux disease) [K21.9] 01/18/2013  . Hyperemesis gravidarum before end of [redacted] week gestation with electrolyte imbalance [O21.1] 09/23/2012  . Trichomonas [A59.9] 09/23/2012  . Supervision of high-risk pregnancy [O09.90] 04/19/2011  . History of molar pregnancy, antepartum [O09.A0] 03/22/2011  . Hyperthyroidism, antepartum [[Y70.623 E07.9] 03/16/2011  . Sickle cell trait ([place] [D57.3] 12/30/2010   Total Time spent with patient: 20 minutes  Past Medical History:  Past Medical History:  Diagnosis Date  . Cholecystitis 08/18/2011  . Headache(784.0)    otc med prn  . History of recurrent UTIs    recurrent UTIs  . Hypertension   . Hyperthyroidism   . Joint pain    knees and shoulders - otc med prn  . Sickle cell trait ([place]   . Sickle cell trait ([place]   . SVD (spontaneous vaginal delivery)    x 3 WH    Past Surgical History:  Procedure Laterality Date  . CHOLECYSTECTOMY  08/19/2011   Procedure: LAPAROSCOPIC CHOLECYSTECTOMY WITH INTRAOPERATIVE CHOLANGIOGRAM;  Surgeon: BMadilyn Hook DO;  [place]  Service: General;  Laterality: N/A;  . DILATION AND CURETTAGE OF UTERUS     molar preg  . LAPAROSCOPIC BILATERAL SALPINGECTOMY Bilateral 05/09/2013   Procedure: LAPAROSCOPIC BILATERAL SALPINGECTOMY;  Surgeon: MEmily Filbert MD;  [facility];  Service: Gynecology;  Laterality: Bilateral;   Family History:  Family History  Problem Relation Age of Onset  . Diabetes Mother   . Cancer Father   . Diabetes Maternal Uncle   . Cancer Maternal Grandmother   . Cancer Maternal Grandfather   . Hypertension Maternal Grandfather   . Diabetes Maternal Grandfather   . Sickle cell anemia Son   . Asthma Son   .  Heart disease Paternal Grandfather   . Hearing loss Paternal Aunt     Social History:  History  Alcohol Use No     History  Drug Use No    Social History   Social History  . Marital status: Single    Spouse name: N/A  . Number of children: N/A  . Years  of education: N/A   Social History Main Topics  . Smoking status: Never Smoker  . Smokeless tobacco: Never Used  . Alcohol use No  . Drug use: No  . Sexual activity: Yes    Birth control/ protection: Surgical   Other Topics Concern  . None   Social History Narrative   2 Adults, 4 children at home   FOB involved   Additional Social History:    Pain Medications: denies Prescriptions: denies Over the Counter: denies History of alcohol / drug use?: No history of alcohol / drug abuse  Sleep: improving   Appetite:  Fair  Current Medications: Current Facility-Administered Medications  Medication Dose Route Frequency Provider Last Rate Last Dose  . acetaminophen (TYLENOL) tablet 650 mg  650 mg Oral Q6H PRN [name], NP   650 mg at 01/13/16 1155  . alum & mag hydroxide-simeth (MAALOX/MYLANTA) 200-200-20 MG/5ML suspension 30 mL  30 mL Oral Q4H PRN [name], NP   30 mL at 01/11/16 1316  . buPROPion (WELLBUTRIN XL) 24 hr tablet 150 mg  150 mg Oral Daily Jenne Campus, MD   150 mg at 01/14/16 0832  . citalopram (CELEXA) tablet 20 mg  20 mg Oral Daily Jenne Campus, MD   20 mg at 01/14/16 0832  . hydrOXYzine (ATARAX/VISTARIL) tablet 50 mg  50 mg Oral Q8H PRN Jenne Campus, MD   50 mg at 01/12/16 2150  . magnesium hydroxide (MILK OF MAGNESIA) suspension 30 mL  30 mL Oral Daily PRN [name], NP   30 mL at 01/11/16 1621  . methimazole (TAPAZOLE) tablet 20 mg  20 mg Oral BID [name], NP   20 mg at 01/14/16 1540  . metoprolol tartrate (LOPRESSOR) tablet 12.5 mg  12.5 mg Oral BID [name], NP   12.5 mg at 01/14/16 0867  . QUEtiapine (SEROQUEL) tablet 25 mg  25 mg Oral Daily Sueanne Margarita, MD   25 mg at 01/14/16 0832  . QUEtiapine (SEROQUEL) tablet 250 mg  250 mg Oral QHS Nanci Pina, FNP   250 mg at 01/13/16 2226    Lab Results:  No results found for this or any previous visit (from the past 48 hour(s)).  Blood Alcohol level:  Lab Results   Component Value Date   ETH <5 01/05/2016   ETH <5 61/95/0932    Metabolic Disorder Labs: Lab Results  Component Value Date   HGBA1C 4.9 01/09/2016   MPG 94 01/09/2016   MPG 108 04/17/2015   Lab Results  Component Value Date   PROLACTIN 35.2 (H) 01/09/2016   Lab Results  Component Value Date   CHOL 121 01/09/2016   TRIG 79 01/09/2016   HDL 37 (L) 01/09/2016   CHOLHDL 3.3 01/09/2016   VLDL 16 01/09/2016   LDLCALC 68 01/09/2016   LDLCALC 59 04/17/2015   Musculoskeletal: Strength & Muscle Tone: within normal limits Gait & Station: normal Patient leans: N/A  Psychiatric Specialty Exam: Physical Exam  ROS does not endorse headache, chest pain or shortness of breath. No nausea or vomiting   Blood pressure  105/68, pulse 96, temperature 98.2 F (36.8 C), temperature source Oral, resp. rate 16, height _0  (1.651 m), weight 73.5 kg (162 lb), last menstrual period 12/19/2015, SpO2 100 %.Body mass index is 26.96 kg/m.  General Appearance: Fairly Groomed  Eye Contact:  Improving   Speech:  Normal Rate  Volume:  Normal  Mood:  Depression, Anxiety  Affect:  remains constricted, more reactive, does smile briefly at times   Thought Process:  Linear  Orientation:  Full (Time, Place, and Person)  Thought Content:  Describes intermittent and fleeting visual hallucinations, described as shadows, does not appear internally preoccupied , no delusions are expressed   Suicidal Thoughts:  None this morning but did last night   Homicidal Thoughts:  No denies any homicidal or violent ideations   Memory:  recent and remote grossly intact   Judgement:  Other:  improving   Insight:  Improving   Psychomotor Activity:  Normal  Concentration:  Concentration: Good and Attention Span: Good  Recall:  [place] of Knowledge:  Good  Language:  Good  Akathisia:  Negative  Handed:  Right  AIMS (if indicated):     Assets:  Desire for Improvement Social Support  ADL's:  Improved   Cognition:   Intact   Sleep:  Number of Hours: 6   Assessment - patient continues to present with some depression, constricted affect, social isolation . 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 Tolerating medications well at this time. No active suicidal ideations .   Treatment Plan Summary: Daily contact with patient to assess and evaluate symptoms and progress in treatment, Medication management, Plan inpatient admission  and medications as below Encourage ongoing group and milieu participation to work on coping skills and symptom reduction Increase Seroquel  250 mgrs  QHS and 55m amfor mood disorder, depression  Continue  Celexa  20 mgrs QDAY for depression  Continue Wellbutrin XL 150 mgrs QAM for depression  Continue Vistaril  50 mgrs Q 8 hours PRN for anxiety  Treatment team working on disposition planning options   URuffin Frederick MD 01/14/2016, 10:09 AM

## 2016-01-14 NOTE — Progress Notes (Signed)
D: Pt presents with sad affect and depressed mood. Pt reported feeling better today compared to yesterday. Pt rates depression 6/10. Anxiety 6/10. Pt denies suicidal thoughts this morning and verbally contracts for safety. Pt continues to appear withdrawn and have very little engagement with peers. Pt observed sitting in dayroom with no interaction.  A: Medications administered as ordered per MD. Medications reviewed with pt. Verbal support provided. Pt encouraged to attend groups. 15 minute checks performed for safety.  R: Pt receptive to tx.

## 2016-01-15 NOTE — Progress Notes (Addendum)
D: Pt. is up and visible in the milieu, sitting quietly and watching TV. Denies having any SI/HI/AVH/Pain at this time. Pt. presents with a flat and depressed affect and mood. Pt. states she is anxious about her possible D/C tomorrow but feels "ready to get a job" and go back home with her sister.   A: Encouragement and support given. Meds. ordered and given.   R: Safety maintained with Q 15 checks. Continues to follow treatment plan and will monitor closely. No questions/concerns at this time.

## 2016-01-15 NOTE — Progress Notes (Signed)
[facility] MD Progress Note  01/15/2016 1:36 PM Grace [name]  MRN:  [id number] Subjective: Met with treatment team about patient and met with patient who reports having a bad dream last night whose content she is unable to recall. Reports mood is less anxious and depressed and no suicidal ideations today. She is participating more in groups and tolerating the increase in citalopram. Objective :  30 year old female with history of MDD who was discharged from here 6 months ago, did well on her medications and stopped taking them. She did well till her mother died last month ago and she developed multiple depressive symptoms including suicide ideatons.  Spoke with patient regarding the fact that boyfirend may not have enough knowledge about mental illness to understand what is going on with her and suggest [place] for her as a support group      Principal Problem: Major depressive disorder, single episode, severe without psychotic features ([place]) Diagnosis:   Patient Active Problem List   Diagnosis Date Noted  . Major depressive disorder, single episode, severe without psychotic features ([place]) [F32.2] 01/05/2016  . Severe episode of recurrent major depressive disorder, with psychotic features ([place]) [F33.3]   . MDD (major depressive disorder), recurrent episode, severe ([place]) [F33.2] 04/15/2015  . Hyperthyroidism [E05.90] 03/21/2015  . Tachycardia [R00.0] 03/21/2015  . Palpitations [R00.2] 03/21/2015  . Chest pain [R07.9] 03/21/2015  . MDD (major depressive disorder), recurrent episode, moderate ([place]) [F33.1] 03/16/2015  . Consultation for female sterilization [O35.00] 05/09/2013  . NSVD (normal spontaneous vaginal delivery) [O80] 03/30/2013  . Chronic hypertension in pregnancy [O10.919] 03/29/2013  . HTN in pregnancy, chronic [O10.919] 03/28/2013  . Transient hypertension of pregnancy, antepartum [O13.9] 03/20/2013  . GERD (gastroesophageal reflux disease) [K21.9] 01/18/2013  .  Hyperemesis gravidarum before end of [redacted] week gestation with electrolyte imbalance [O21.1] 09/23/2012  . Trichomonas [A59.9] 09/23/2012  . Supervision of high-risk pregnancy [O09.90] 04/19/2011  . History of molar pregnancy, antepartum [O09.A0] 03/22/2011  . Hyperthyroidism, antepartum [X38.182, E07.9] 03/16/2011  . Sickle cell trait ([place]) [D57.3] 12/30/2010   Total Time spent with patient: 20 minutes  Past Medical History:  Past Medical History:  Diagnosis Date  . Cholecystitis 08/18/2011  . Headache(784.0)    otc med prn  . History of recurrent UTIs    recurrent UTIs  . Hypertension   . Hyperthyroidism   . Joint pain    knees and shoulders - otc med prn  . Sickle cell trait ([place])   . Sickle cell trait ([place])   . SVD (spontaneous vaginal delivery)    x 3 WH    Past Surgical History:  Procedure Laterality Date  . CHOLECYSTECTOMY  08/19/2011   Procedure: LAPAROSCOPIC CHOLECYSTECTOMY WITH INTRAOPERATIVE CHOLANGIOGRAM;  Surgeon: Madilyn Hook, DO;  Location: [place];  Service: General;  Laterality: N/A;  . DILATION AND CURETTAGE OF UTERUS     molar preg  . LAPAROSCOPIC BILATERAL SALPINGECTOMY Bilateral 05/09/2013   Procedure: LAPAROSCOPIC BILATERAL SALPINGECTOMY;  Surgeon: Emily Filbert, MD;  Location: [facility];  Service: Gynecology;  Laterality: Bilateral;   Family History:  Family History  Problem Relation Age of Onset  . Diabetes Mother   . Cancer Father   . Diabetes Maternal Uncle   . Cancer Maternal Grandmother   . Cancer Maternal Grandfather   . Hypertension Maternal Grandfather   . Diabetes Maternal Grandfather   . Sickle cell anemia Son   . Asthma Son   . Heart disease Paternal Grandfather   .  Hearing loss Paternal Aunt     Social History:  History  Alcohol Use No     History  Drug Use No    Social History   Social History  . Marital status: Single    Spouse name: N/A  . Number of children: N/A  . Years of education: N/A   Social History Main Topics  .  Smoking status: Never Smoker  . Smokeless tobacco: Never Used  . Alcohol use No  . Drug use: No  . Sexual activity: Yes    Birth control/ protection: Surgical   Other Topics Concern  . None   Social History Narrative   2 Adults, 4 children at home   FOB involved   Additional Social History:    Pain Medications: denies Prescriptions: denies Over the Counter: denies History of alcohol / drug use?: No history of alcohol / drug abuse  Sleep: improving   Appetite:  Fair  Current Medications: Current Facility-Administered Medications  Medication Dose Route Frequency Provider Last Rate Last Dose  . acetaminophen (TYLENOL) tablet 650 mg  650 mg Oral Q6H PRN [name], NP   650 mg at 01/13/16 1155  . alum & mag hydroxide-simeth (MAALOX/MYLANTA) 200-200-20 MG/5ML suspension 30 mL  30 mL Oral Q4H PRN [name], NP   30 mL at 01/14/16 1821  . buPROPion (WELLBUTRIN XL) 24 hr tablet 150 mg  150 mg Oral Daily Jenne Campus, MD   150 mg at 01/15/16 0820  . citalopram (CELEXA) tablet 30 mg  30 mg Oral Daily Sueanne Margarita, MD   30 mg at 01/15/16 0820  . hydrOXYzine (ATARAX/VISTARIL) tablet 50 mg  50 mg Oral Q8H PRN Jenne Campus, MD   50 mg at 01/14/16 1707  . magnesium hydroxide (MILK OF MAGNESIA) suspension 30 mL  30 mL Oral Daily PRN [name], NP   30 mL at 01/11/16 1621  . methimazole (TAPAZOLE) tablet 20 mg  20 mg Oral BID [name], NP   20 mg at 01/15/16 0820  . metoprolol tartrate (LOPRESSOR) tablet 12.5 mg  12.5 mg Oral BID [name], NP   12.5 mg at 01/15/16 0820  . QUEtiapine (SEROQUEL) tablet 25 mg  25 mg Oral Daily Sueanne Margarita, MD   25 mg at 01/15/16 0820  . QUEtiapine (SEROQUEL) tablet 250 mg  250 mg Oral QHS Nanci Pina, FNP   250 mg at 01/14/16 2157    Lab Results:  No results found for this or any previous visit (from the past 48 hour(s)).  Blood Alcohol level:  Lab Results  Component Value Date   ETH <5 01/05/2016   ETH <5  73/71/0626    Metabolic Disorder Labs: Lab Results  Component Value Date   HGBA1C 4.9 01/09/2016   MPG 94 01/09/2016   MPG 108 04/17/2015   Lab Results  Component Value Date   PROLACTIN 35.2 (H) 01/09/2016   Lab Results  Component Value Date   CHOL 121 01/09/2016   TRIG 79 01/09/2016   HDL 37 (L) 01/09/2016   CHOLHDL 3.3 01/09/2016   VLDL 16 01/09/2016   LDLCALC 68 01/09/2016   LDLCALC 59 04/17/2015   Musculoskeletal: Strength & Muscle Tone: within normal limits Gait & Station: normal Patient leans: N/A  Psychiatric Specialty Exam: Physical Exam  ROS does not endorse headache, chest pain or shortness of breath. No nausea or vomiting   Blood pressure 101/73, pulse 90, temperature 98 F (36.7  C), temperature source Oral, resp. rate 16, height '5\' 5"'$  (1.651 m), weight 73.5 kg (162 lb), last menstrual period 12/19/2015, SpO2 100 %.Body mass index is 26.96 kg/m.  General Appearance: Fairly Groomed  Eye Contact:  Improving   Speech:  Normal Rate  Volume:  Normal  Mood:  Depression, Anxiety  Affect:  remains constricted, more reactive, does smile briefly at times   Thought Process:  Linear  Orientation:  Full (Time, Place, and Person)  Thought Content:  Describes intermittent and fleeting visual hallucinations, described as shadows, does not appear internally preoccupied , no delusions are expressed   Suicidal Thoughts:  None  Homicidal Thoughts:  No denies any homicidal or violent ideations   Memory:  recent and remote grossly intact   Judgement:  Other:  improving   Insight:  Improving   Psychomotor Activity:  Normal  Concentration:  Concentration: Good and Attention Span: Good  Recall:  [place] of Knowledge:  Good  Language:  Good  Akathisia:  Negative  Handed:  Right  AIMS (if indicated):     Assets:  Desire for Improvement Social Support  ADL's:  Improved   Cognition:  Intact   Sleep:  Number of Hours: 6   Assessment - patient continues to present with  some depression, constricted affect, social isolation . 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 Tolerating medications well at this time. No active suicidal ideations .   Treatment Plan Summary: Daily contact with patient to assess and evaluate symptoms and progress in treatment, Medication management, Plan inpatient admission  and medications as below Encourage ongoing group and milieu participation to work on coping skills and symptom reduction Increase Seroquel  250 mgrs  QHS and '25mg'$  amfor mood disorder, depression  Continue  Celexa  20 mgrs QDAY for depression  Continue Wellbutrin XL 150 mgrs QAM for depression  Continue Vistaril  50 mgrs Q 8 hours PRN for anxiety  Treatment team working on disposition planning options   Ruffin Frederick, MD 01/15/2016, 1:36 PM

## 2016-01-15 NOTE — Plan of Care (Signed)
Problem: Activity: Goal: Interest or engagement in activities will improve Outcome: Progressing Pt attended group this evening   

## 2016-01-15 NOTE — Progress Notes (Signed)
D: Pt. is up and visible in the milieu, resting in dayroom alone. Denies having any SI/HI/AVH at this time. Pt. presents with a depressed affect and mood, much improvement in s/s from the night before. Pt. states she is "ready for D/C tomorrow". Pt. complained of a mild headache but does not want any mediation at this time.   A: Encouragement and support given. Meds. ordered and given.   R: Safety maintained with Q 15 checks. Continues to follow treatment plan and will monitor closely. No questions/concerns.

## 2016-01-15 NOTE — Progress Notes (Signed)
D: Pt presents with sad affect and depressed mood. Pt rates depression 10/10. Anxiety 8/10. Pt reported passive suicidal thoughts this morning. Pt last endorsed suicidal thoughts earlier this morning. Pt denies any active suicidal thoughts during assessment and verbally contracts for safety. Pt reported that she's coping with her depression and plans to put her coping skills to the test today. Pt attended group this morning but showed little interest. Pt encouraged to participate and shared with the group when asked.  A:  Medications reviewed with pt. Medications administered as ordered per MD. Verbal support provided. Pt encouraged to attend groups and participate. 15 minute checks performed for safety.  R: Pt receptive to tx. Pt verbalized understanding of med regimen.

## 2016-01-15 NOTE — Progress Notes (Signed)
Adult Psychoeducational Group Note  Date:  01/15/2016 Time:  0845 am  Group Topic/Focus:  Orientation:   The focus of this group is to educate the patient on the purpose and policies of crisis stabilization and provide a format to answer questions about their admission.  The group details unit policies and expectations of patients while admitted.   Participation Level:  Active  Participation Quality:  Appropriate  Affect:  Appropriate  Cognitive:  Appropriate  Insight: Appropriate  Engagement in Group:  Engaged  Modes of Intervention:  Discussion, Education and Orientation  Additional Comments:   Legion Discher L 01/15/2016, 4:12 PM

## 2016-01-15 NOTE — [facility] Group Notes (Signed)
Pt attended group and the subject was on lifestyle changes. Writer ask what is one thing you would change about yourself or your life? Pt stated open up more and hold things in

## 2016-01-15 NOTE — [facility] Counselor (Signed)
[facility] 01/15/2016 1:15pm  Type of Therapy: Mental Health Association Presentation  Participation Level: Active  Participation Quality: Attentive  Affect: Appropriate  Cognitive: Oriented  Insight: Developing/Improving  Engagement in Therapy: Engaged  Modes of Intervention: Discussion, Education and Socialization  Summary of Progress/Problems: Mental Health Association (MHA) Speaker came to talk about his personal journey with substance abuse and addiction. The pt processed ways by which to relate to the speaker. MHA speaker provided handouts and educational information pertaining to groups and services offered by [facility]. Pt was engaged in speaker's presentation and was receptive to resources provided.    Chad CordialLauren Carter, LCSWA 01/15/2016 1:50 PM

## 2016-01-16 MED ORDER — METOPROLOL TARTRATE 25 MG PO TABS: 12.5000 mg | ORAL_TABLET | Freq: Two times a day (BID) | ORAL | 0 refills | Status: DC

## 2016-01-16 MED ORDER — BUPROPION HCL ER (XL) 150 MG PO TB24: 150.0000 mg | ORAL_TABLET | Freq: Every day | ORAL | 0 refills | Status: DC

## 2016-01-16 MED ORDER — QUETIAPINE FUMARATE 25 MG PO TABS: 25.0000 mg | ORAL_TABLET | Freq: Every day | ORAL | 0 refills | Status: DC

## 2016-01-16 MED ORDER — HYDROXYZINE HCL 50 MG PO TABS: 50.0000 mg | ORAL_TABLET | Freq: Three times a day (TID) | ORAL | 0 refills | Status: DC | PRN

## 2016-01-16 MED ORDER — CITALOPRAM HYDROBROMIDE 10 MG PO TABS: 30.0000 mg | ORAL_TABLET | Freq: Every day | ORAL | 0 refills | Status: DC

## 2016-01-16 MED ORDER — QUETIAPINE FUMARATE 50 MG PO TABS: 250.0000 mg | ORAL_TABLET | Freq: Every day | ORAL | 0 refills | Status: DC

## 2016-01-16 MED ORDER — METHIMAZOLE 10 MG PO TABS: 20.0000 mg | ORAL_TABLET | Freq: Two times a day (BID) | ORAL | 0 refills | Status: DC

## 2016-01-16 NOTE — Progress Notes (Addendum)
Pt is readied for dc today as she completes her daily assessment. . She writes on it  That she  denies having SI today  And she rates her depression, hopelessness and anxiety " 0/0/0", respectively. She is given prescriptions for her medications and then the AVS, MD SRA, transition record and pt completed suicide safety plan are all reviewed with pt and cc of these documents are given to her.  and belongings previously locked in pt's locker are returned to her and then she is escorted to bldg entrance. She is given a bus ticket from SW and dc completed.

## 2016-01-16 NOTE — Tx Team (Signed)
Interdisciplinary Treatment and Diagnostic Plan Update  01/16/2016 Time of Session: 10:52 AM  Grace [name] MRN: [id number]  Principal Diagnosis: Major depressive disorder, single episode, severe without psychotic features (HCC)  Secondary Diagnoses: Principal Problem:   Major depressive disorder, single episode, severe without psychotic features (HCC)   Current Medications:  Current Facility-Administered Medications  Medication Dose Route Frequency Provider Last Rate Last Dose  . acetaminophen (TYLENOL) tablet 650 mg  650 mg Oral Q6H PRN Charm Rings, NP   650 mg at 01/13/16 1155  . alum & mag hydroxide-simeth (MAALOX/MYLANTA) 200-200-20 MG/5ML suspension 30 mL  30 mL Oral Q4H PRN Charm Rings, NP   30 mL at 01/14/16 1821  . buPROPion (WELLBUTRIN XL) 24 hr tablet 150 mg  150 mg Oral Daily Craige Cotta, MD   150 mg at 01/16/16 0851  . citalopram (CELEXA) tablet 30 mg  30 mg Oral Daily Molinda Bailiff, MD   30 mg at 01/16/16 0851  . hydrOXYzine (ATARAX/VISTARIL) tablet 50 mg  50 mg Oral Q8H PRN Craige Cotta, MD   50 mg at 01/14/16 1707  . magnesium hydroxide (MILK OF MAGNESIA) suspension 30 mL  30 mL Oral Daily PRN Charm Rings, NP   30 mL at 01/11/16 1621  . methimazole (TAPAZOLE) tablet 20 mg  20 mg Oral BID Charm Rings, NP   20 mg at 01/16/16 0851  . metoprolol tartrate (LOPRESSOR) tablet 12.5 mg  12.5 mg Oral BID Charm Rings, NP   12.5 mg at 01/16/16 0851  . QUEtiapine (SEROQUEL) tablet 25 mg  25 mg Oral Daily Molinda Bailiff, MD   25 mg at 01/16/16 0851  . QUEtiapine (SEROQUEL) tablet 250 mg  250 mg Oral QHS Truman Hayward, FNP   250 mg at 01/15/16 2150    PTA Medications: Prescriptions Prior to Admission  Medication Sig Dispense Refill Last Dose  . buPROPion (WELLBUTRIN XL) 300 MG 24 hr tablet Take 1 tablet (300 mg total) by mouth daily. (Patient not taking: Reported on 01/06/2016) 30 tablet 0 Not Taking at Unknown time  . citalopram (CELEXA) 40 MG tablet  Take 1 tablet (40 mg total) by mouth daily. (Patient not taking: Reported on 01/06/2016) 30 tablet 0 Not Taking at Unknown time  . hydrOXYzine (ATARAX/VISTARIL) 25 MG tablet Take 1 tablet (25 mg total) by mouth every 6 (six) hours as needed for anxiety. (Patient not taking: Reported on 01/06/2016) 30 tablet 0 Not Taking at Unknown time  . methimazole (TAPAZOLE) 10 MG tablet Take 2 tablets (20 mg total) by mouth 2 (two) times daily. For hyperthyroidim (Patient not taking: Reported on 01/06/2016) 120 tablet 3 Not Taking at Unknown time  . metoprolol tartrate (LOPRESSOR) 25 MG tablet Take 0.5 tablets (12.5 mg total) by mouth 2 (two) times daily. For high blood pressure (Patient not taking: Reported on 01/06/2016) 30 tablet 0 Not Taking at Unknown time  . naproxen (NAPROSYN) 500 MG tablet Take 1 tablet (500 mg total) by mouth 2 (two) times daily. (Patient not taking: Reported on 01/06/2016) 20 tablet 0 Not Taking at Unknown time  . pseudoephedrine (SUDAFED) 60 MG tablet Take 1 tablet (60 mg total) by mouth every 4 (four) hours as needed for congestion. (Patient not taking: Reported on 01/06/2016) 15 tablet 0 Not Taking at Unknown time  . QUEtiapine (SEROQUEL) 200 MG tablet Take 1 tablet (200 mg total) by mouth at bedtime. (Patient not taking: Reported on 01/06/2016) 30 tablet 0 Not  Taking at Unknown time  . QUEtiapine (SEROQUEL) 25 MG tablet Take 1 tablet (25 mg total) by mouth 3 (three) times daily. (Patient not taking: Reported on 01/06/2016) 90 tablet 0 Not Taking at Unknown time    Treatment Modalities: Medication Management, Group therapy, Case management,  1 to 1 session with clinician, Psychoeducation, Recreational therapy.   Physician Treatment Plan for Primary Diagnosis: Major depressive disorder, single episode, severe without psychotic features (HCC) Long Term Goal(s): Improvement in symptoms so as ready for discharge  Short Term Goals: Ability to identify changes in lifestyle to reduce recurrence of  condition will improve, Ability to verbalize feelings will improve, Ability to disclose and discuss suicidal ideas, Ability to demonstrate self-control will improve, Ability to identify and develop effective coping behaviors will improve, Ability to maintain clinical measurements within normal limits will improve, Compliance with prescribed medications will improve and Ability to identify triggers associated with substance abuse/mental health issues will improve  Medication Management: Evaluate patient's response, side effects, and tolerance of medication regimen.  Therapeutic Interventions: 1 to 1 sessions, Unit Group sessions and Medication administration.  Evaluation of Outcomes: Adequate for Discharge  Physician Treatment Plan for Secondary Diagnosis: Principal Problem:   Major depressive disorder, single episode, severe without psychotic features (HCC)   Long Term Goal(s): Improvement in symptoms so as ready for discharge  Short Term Goals: Ability to identify changes in lifestyle to reduce recurrence of condition will improve, Ability to verbalize feelings will improve, Ability to disclose and discuss suicidal ideas, Ability to demonstrate self-control will improve, Ability to identify and develop effective coping behaviors will improve, Ability to maintain clinical measurements within normal limits will improve, Compliance with prescribed medications will improve and Ability to identify triggers associated with substance abuse/mental health issues will improve  Medication Management: Evaluate patient's response, side effects, and tolerance of medication regimen.  Therapeutic Interventions: 1 to 1 sessions, Unit Group sessions and Medication administration.  Evaluation of Outcomes: Adequate for Discharge   RN Treatment Plan for Primary Diagnosis: Major depressive disorder, single episode, severe without psychotic features (HCC) Long Term Goal(s): Knowledge of disease and therapeutic  regimen to maintain health will improve  Short Term Goals: Ability to verbalize feelings will improve, Ability to disclose and discuss suicidal ideas and Ability to identify and develop effective coping behaviors will improve  Medication Management: RN will administer medications as ordered by provider, will assess and evaluate patient's response and provide education to patient for prescribed medication. RN will report any adverse and/or side effects to prescribing provider.  Therapeutic Interventions: 1 on 1 counseling sessions, Psychoeducation, Medication administration, Evaluate responses to treatment, Monitor vital signs and CBGs as ordered, Perform/monitor CIWA, COWS, AIMS and Fall Risk screenings as ordered, Perform wound care treatments as ordered.  Evaluation of Outcomes: Adequate for Discharge   LCSW Treatment Plan for Primary Diagnosis: Major depressive disorder, single episode, severe without psychotic features (HCC) Long Term Goal(s): Safe transition to appropriate next level of care at discharge, Engage patient in therapeutic group addressing interpersonal concerns.  Short Term Goals: Engage patient in aftercare planning with referrals and resources, Increase emotional regulation, Identify triggers associated with mental health/substance abuse issues and Increase skills for wellness and recovery  Therapeutic Interventions: Assess for all discharge needs, 1 to 1 time with Social worker, Explore available resources and support systems, Assess for adequacy in community support network, Educate family and significant other(s) on suicide prevention, Complete Psychosocial Assessment, Interpersonal group therapy.  Evaluation of Outcomes: Adequate for Discharge  Progress in Treatment: Attending groups: Yes Participating in groups: Minimally Taking medication as prescribed: Yes, MD continues to assess for medication changes as needed Toleration medication: Yes, no side effects reported  at this time Family/Significant other contact made: No, Pt declines Patient understands diagnosis: Developing insight Discussing patient identified problems/goals with staff: Yes Medical problems stabilized or resolved: Yes Denies suicidal/homicidal ideation: Yes Issues/concerns per patient self-inventory: None Other: N/A  New problem(s) identified: None identified at this time.   New Short Term/Long Term Goal(s): None identified at this time.   Discharge Plan or Barriers: Pt will return home and follow-up with outpatient services.   Reason for Continuation of Hospitalization: Depression Hallucinations Medication stabilization Suicidal ideation  Estimated Length of Stay: 0 days  Attendees: Patient: 01/16/2016  10:52 AM  Physician: Dr. Seth BakeLekwauwa 01/16/2016  10:52 AM  Nursing: Marlyn CorporalPatricia Duke, RN 01/16/2016  10:52 AM  RN Care Manager: Onnie BoerJennifer Clark, RN 01/16/2016  10:52 AM  Social Worker: Chad CordialLauren Carter, LCSW; Kristin Drinkard, LCSW 01/16/2016  10:52 AM  Recreational Therapist:  01/16/2016  10:52 AM  Other:  01/16/2016  10:52 AM  Other: Armandina StammerAgnes Nwoko, NP 01/16/2016  10:52 AM  Other: 01/16/2016  10:52 AM    Scribe for Treatment Team: Elaina HoopsLauren M Carter, LCSW 01/16/2016 10:52 AM

## 2016-01-16 NOTE — Discharge Summary (Signed)
Physician Discharge Summary Note  Patient:  Grace [name] is an 30 y.o., female MRN:  [id number] DOB:  10/02/1985 Patient phone:  [phone] (home)  Patient address:   [address] [place] [address],  Total Time spent with patient: Greater than 30 minutes  Date of Admission:  01/05/2016  Date of Discharge: 01/16/2016  Reason for Admission: Worsening symptoms of depression.  Principal Problem: Major depressive disorder, single episode, severe without psychotic features [facility](HCC)  Discharge Diagnoses: Patient Active Problem List   Diagnosis Date Noted  . Major depressive disorder, single episode, severe without psychotic features (HCC) [F32.2] 01/05/2016  . 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 MDD (major depressive disorder), recurrent episode, severe (HCC) [F33.2] 04/15/2015  . Hyperthyroidism [E05.90] 03/21/2015  . Tachycardia [R00.0] 03/21/2015  . Palpitations [R00.2] 03/21/2015  . Chest pain [R07.9] 03/21/2015  . MDD (major depressive disorder), recurrent episode, moderate (HCC) [F33.1] 03/16/2015  . Consultation for female sterilization [Z30.09] 05/09/2013  . NSVD (normal spontaneous vaginal delivery) [O80] 03/30/2013  . Chronic hypertension in pregnancy [O10.919] 03/29/2013  . HTN in pregnancy, chronic [O10.919] 03/28/2013  . Transient hypertension of pregnancy, antepartum [O13.9] 03/20/2013  . GERD (gastroesophageal reflux disease) [K21.9] 01/18/2013  . Hyperemesis gravidarum before end of [redacted] week gestation with electrolyte imbalance [O21.1] 09/23/2012  . Trichomonas [A59.9] 09/23/2012  . Supervision of high-risk pregnancy [O09.90] 04/19/2011  . History of molar pregnancy, antepartum [O09.A0] 03/22/2011  . Hyperthyroidism, antepartum [G29.528[O99.280, E07.9] 03/16/2011  . Sickle cell trait (HCC) [D57.3] 12/30/2010   Past Psychiatric History: Major depression, severe.  Past Medical History:  Past Medical  History:  Diagnosis Date  . Cholecystitis 08/18/2011  . Headache(784.0)    otc med prn  . History of recurrent UTIs    recurrent UTIs  . Hypertension   . Hyperthyroidism   . Joint pain    knees and shoulders - otc med prn  . Sickle cell trait (HCC)   . Sickle cell trait (HCC)   . SVD (spontaneous vaginal delivery)    x 3 [facility]    Past Surgical History:  Procedure Laterality Date  . CHOLECYSTECTOMY  08/19/2011   Procedure: LAPAROSCOPIC CHOLECYSTECTOMY WITH INTRAOPERATIVE CHOLANGIOGRAM;  Surgeon: Lodema PilotBrian Layton, DO;  Location: [facility] OR;  Service: General;  Laterality: N/A;  . DILATION AND CURETTAGE OF UTERUS     molar preg  . LAPAROSCOPIC BILATERAL SALPINGECTOMY Bilateral 05/09/2013   Procedure: LAPAROSCOPIC BILATERAL SALPINGECTOMY;  Surgeon: Allie BossierMyra C Dove, MD;  Location: [facility] ORS;  Service: Gynecology;  Laterality: Bilateral;   Family History:  Family History  Problem Relation Age of Onset  . Diabetes Mother   . Cancer Father   . Diabetes Maternal Uncle   . Cancer Maternal Grandmother   . Cancer Maternal Grandfather   . Hypertension Maternal Grandfather   . Diabetes Maternal Grandfather   . Sickle cell anemia Son   . Asthma Son   . Heart disease Paternal Grandfather   . Hearing loss Paternal Aunt    Family Psychiatric History: See H&P  Social History:  History  Alcohol Use No     History  Drug Use No    Social History   Social History  . Marital status: Single    Spouse name: N/A  . Number of children: N/A  . Years of education: N/A   Social History Main Topics  . Smoking status: Never Smoker  . Smokeless tobacco: Never Used  . Alcohol use No  .  Drug use: No  . Sexual activity: Yes    Birth control/ protection: Surgical   Other Topics Concern  . None   Social History Narrative   2 Adults, 4 children at home   FOB involved   Hospital Course:  Grace [name], a 30 y.o.femalewith history of depression.  She was previously admitted to [facility] Jan 2017 and was  discharged on Celexa , low dose Seroquel,and Wellbutrin. This time, she presents voluntarilyaccompanied by reporting symptoms of depression and suicidal ideation that have worsened since the death of her mom 1 mo ago. She states she attempted to "drink herself to death" this weekend with liquor that her sister had. Pt denies SA problems, but said that her drinking was a suicide attempt. Pt also has a history of suicide attempts.  Grace [name] was admitted to the hospital for worsening symptoms of depression & crisis management due to suicidal ideations. She cited the recent death of her mother a month ago as the trigger. After her admission assessment, her presenting symptoms were identified. The medication regimen targeting those symptoms were initiated. She was medicated & discharged on; Wellbutrin XL 150 mg for depression, Citalopram 30 mg for depression, hydroxyzine 50 mg prn for anxiety, Seroquel 25 mg & 250 mg respectively for agitation/mood control. Her other pre-existing medical problems were identified & her her pertinent home medications resumed. She was enrolled & participated in the group counseling sessions being offered & held on this unit. She learned coping skills..  During the course of her hospitalization, Grace [name]'s improvement was monitored by observation & her daily report of symptom reduction. Her emotional and mental status was monitored by daily self-inventory reports completed by her & the clinical staff. She was evaluated by the treatment team for stability and plans for continued recovery after discharge. Grace [name] motivation was an integral factor in her mood stability. She was offered further treatment option upon discharge to continue mental health care on an outpatient basis as noted below.    Upon discharge, Grace [name] was both mentally and medically stable. She denies suicidal/homicidal ideations, auditory/visual/tactile hallucinations, delusional thoughts or paranoia. She received  from the pharmacy, a 7 days worth, supply samples of her [facility] discharge medications. She left [facility] with all belongings in no distress. Transportation per city bus. [facility] assisted with bus pass.  Physical Findings: AIMS:  , ,  ,  ,    CIWA:    COWS:     Musculoskeletal: Strength & Muscle Tone: within normal limits Gait & Station: normal Patient leans: N/A  Psychiatric Specialty Exam:  See MD SRA Review of Systems  Constitutional: Negative.   HENT: Negative.   Eyes: Negative.   Respiratory: Negative.   Cardiovascular: Negative.   Gastrointestinal: Negative.   Genitourinary: Negative.   Musculoskeletal: Negative.   Skin: Negative.   Neurological: Negative.   Endo/Heme/Allergies: Negative.   Psychiatric/Behavioral: Positive for depression (Stable) and substance abuse (Hx of). Negative for hallucinations, memory loss and suicidal ideas. The patient has insomnia (Stable). The patient is not nervous/anxious.   All other systems reviewed and are negative.   Blood pressure 117/75, pulse 94, temperature 98.6 F (37 C), temperature source Oral, resp. rate 20, height 5\' 5"  (1.651 m), weight 73.5 kg (162 lb), last menstrual period 12/19/2015, SpO2 100 %.Body mass index is 26.96 kg/m.  Have you used any form of tobacco in the last 30 days? (Cigarettes, Smokeless Tobacco, Cigars, and/or Pipes): No  Has this patient used any form of tobacco in the last 30  days? (Cigarettes, Smokeless Tobacco, Cigars, and/or Pipes) Yes,   Metabolic Disorder Labs:  Lab Results  Component Value Date   HGBA1C 4.9 01/09/2016   MPG 94 01/09/2016   MPG 108 04/17/2015   Lab Results  Component Value Date   PROLACTIN 35.2 (H) 01/09/2016   Lab Results  Component Value Date   CHOL 121 01/09/2016   TRIG 79 01/09/2016   HDL 37 (L) 01/09/2016   CHOLHDL 3.3 01/09/2016   VLDL 16 01/09/2016   LDLCALC 68 01/09/2016   LDLCALC 59 04/17/2015   See Psychiatric Specialty Exam and Suicide Risk Assessment completed by  Attending Physician prior to discharge.  Discharge destination:  Home  Is patient on multiple antipsychotic therapies at discharge:  No   Has Patient had three or more failed trials of antipsychotic monotherapy by history:  No  Recommended Plan for Multiple Antipsychotic Therapies: NA    Medication List    STOP taking these medications   naproxen 500 MG tablet Commonly known as:  NAPROSYN   pseudoephedrine 60 MG tablet Commonly known as:  SUDAFED     TAKE these medications     Indication  buPROPion 150 MG 24 hr tablet Commonly known as:  WELLBUTRIN XL Take 1 tablet (150 mg total) by mouth daily. For depression Start taking on:  01/17/2016 What changed:  medication strength  how much to take  additional instructions  Indication:  Major Depressive Disorder   citalopram 10 MG tablet Commonly known as:  CELEXA Take 3 tablets (30 mg total) by mouth daily. For depression Start taking on:  01/17/2016 What changed:  medication strength  how much to take  additional instructions  Indication:  Depression   hydrOXYzine 50 MG tablet Commonly known as:  ATARAX/VISTARIL Take 1 tablet (50 mg total) by mouth every 8 (eight) hours as needed for anxiety. What changed:  medication strength  how much to take  when to take this  Indication:  Anxiety Neurosis, Tension, Anxiety   methimazole 10 MG tablet Commonly known as:  TAPAZOLE Take 2 tablets (20 mg total) by mouth 2 (two) times daily. For hyperthyroidim  Indication:  Overactive Thyroid Gland   metoprolol tartrate 25 MG tablet Commonly known as:  LOPRESSOR Take 0.5 tablets (12.5 mg total) by mouth 2 (two) times daily. For high blood pressure  Indication:  High Blood Pressure Disorder   QUEtiapine 50 MG tablet Commonly known as:  SEROQUEL Take 5 tablets (250 mg total) by mouth at bedtime. For mood control What changed:  medication strength  how much to take  when to take this  additional instructions   Indication:  Mood control   QUEtiapine 25 MG tablet Commonly known as:  SEROQUEL Take 1 tablet (25 mg total) by mouth daily. For agitation Start taking on:  01/17/2016 What changed:  medication strength  how much to take  when to take this  additional instructions  Indication:  Agitation      Follow-up Information    [facility] .   Specialty:  Professional Counselor Why:  Please use the walk-in clinic for your initial appointment; walk-in hours are Monday-Friday between 8am-12pm.  Contact information: [address] [place] [address] [phone]          Follow-up recommendations: Activity:  As tolerated Diet: As recommended by your primary care doctor. Keep all scheduled follow-up appointments as recommended.    Comments: Patient is instructed prior to discharge to: Take all medications as prescribed by his/her  mental healthcare provider. Report any adverse effects and or reactions from the medicines to his/her outpatient provider promptly. Patient has been instructed & cautioned: To not engage in alcohol and or illegal drug use while on prescription medicines. In the event of worsening symptoms, patient is instructed to call the crisis hotline, 911 and or go to the nearest ED for appropriate evaluation and treatment of symptoms. To follow-up with his/her primary care provider for your other medical issues, concerns and or health care needs.    Signed: Armandina Stammer I AGNP-BC 01/16/2016, 12:45 PM

## 2016-01-16 NOTE — Progress Notes (Signed)
  [facility] :  Will you be returning to the same living situation after discharge:  Yes,  Pt returning to sister's house At discharge, do you have transportation home?: Yes,  Pt provided with bus pass Do you have the ability to pay for your medications: Yes,  Pt provided with prescriptions  Release of information consent forms completed and in the chart;  Patient's signature needed at discharge.  Patient to Follow up at: Follow-up Information    [facility] .   Specialty:  Professional Counselor Why:  Please use the walk-in clinic for your initial appointment; walk-in hours are Monday-Friday between 8am-12pm.  Contact information: [address] [place] [address] [phone]           Next level of care provider has access to [facility] Link:no  Safety Planning and Suicide Prevention discussed: Yes,  with Pt; declines family contact  Have you used any form of tobacco in the last 30 days? (Cigarettes, Smokeless Tobacco, Cigars, and/or Pipes): No  Has patient been referred to the Quitline?: N/A patient is not a smoker  Patient has been referred for addiction treatment: Yes  Meiya Wisler Lavonna RuaM Carter 01/16/2016, 10:53 AM

## 2016-01-16 NOTE — [facility] Suicide Risk Assessment (Signed)
[facility]   Principal Problem: Major depressive disorder, single episode, severe without psychotic features [facility](HCC) Discharge Diagnoses:  Patient Active Problem List   Diagnosis Date Noted  . Major depressive disorder, single episode, severe without psychotic features (HCC) [F32.2] 01/05/2016  . 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 MDD (major depressive disorder), recurrent episode, severe (HCC) [F33.2] 04/15/2015  . Hyperthyroidism [E05.90] 03/21/2015  . Tachycardia [R00.0] 03/21/2015  . Palpitations [R00.2] 03/21/2015  . Chest pain [R07.9] 03/21/2015  . MDD (major depressive disorder), recurrent episode, moderate (HCC) [F33.1] 03/16/2015  . Consultation for female sterilization [Z30.09] 05/09/2013  . NSVD (normal spontaneous vaginal delivery) [O80] 03/30/2013  . Chronic hypertension in pregnancy [O10.919] 03/29/2013  . HTN in pregnancy, chronic [O10.919] 03/28/2013  . Transient hypertension of pregnancy, antepartum [O13.9] 03/20/2013  . GERD (gastroesophageal reflux disease) [K21.9] 01/18/2013  . Hyperemesis gravidarum before end of [redacted] week gestation with electrolyte imbalance [O21.1] 09/23/2012  . Trichomonas [A59.9] 09/23/2012  . Supervision of high-risk pregnancy [O09.90] 04/19/2011  . History of molar pregnancy, antepartum [O09.A0] 03/22/2011  . Hyperthyroidism, antepartum [Z61.096[O99.280, E07.9] 03/16/2011  . Sickle cell trait (HCC) [D57.3] 12/30/2010    Total Time spent with patient: 30 minutes  Musculoskeletal: Strength & Muscle Tone: within normal limits Gait & Station: normal Patient leans: N/A  Psychiatric Specialty Exam: ROS  Blood pressure 117/75, pulse 94, temperature 98.6 F (37 C), temperature source Oral, resp. rate 20, height 5\' 5"  (1.651 m), weight 73.5 kg (162 lb), last menstrual period 12/19/2015, SpO2 100 %.Body mass index is 26.96 kg/m.  General Appearance: Well groomed   Eye Contact::  Good  Speech:  Clear and Coherent and Normal Rate409  Volume:  Normal  Mood:  Euthymic  Affect:  Appropriate and Congruent  Thought Process:  Coherent  Orientation:  Full (Time, Place, and Person)  Thought Content:  Logical  Suicidal Thoughts:  No  Homicidal Thoughts:  No  Memory:  Immediate;   Fair Recent;   Fair Remote;   Fair  Judgement:  Fair  Insight:  Fair  Psychomotor Activity:  Normal  Concentration:  Fair  Recall:  [employer]Fair  Fund of Knowledge:Fair  Language: Fair  Akathisia:  No  Handed:  Right  AIMS (if indicated):     Assets:  Communication Skills Desire for Improvement Financial Resources/Insurance Housing Intimacy Physical Health Social Support Transportation  Sleep:  Number of Hours: 6.75  Cognition: WNL  ADL's:  Intact   Mental Status Per Nursing Assessment::   On Admission:  Suicidal ideation indicated by patient  Demographic Factors:  NA  Loss Factors: NA  Historical Factors: Prior suicide attempts and Impulsivity  Risk Reduction Factors:   Responsible for children under 32 years of age, Sense of responsibility to family, Religious beliefs about death, Living with another person, especially a relative and Positive social support  Continued Clinical Symptoms:  None  Cognitive Features That Contribute To Risk:  None    Suicide Risk:  Minimal: No identifiable suicidal ideation.  Patients presenting with no risk factors but with morbid ruminations; may be classified as minimal risk based on the severity of the depressive symptoms  Follow-up Information    [facility] .   Specialty:  Professional Counselor Why:  Please use the walk-in clinic for your initial appointment; walk-in hours are Monday-Friday between 8am-12pm.  Contact information: [address] [place] [phone] [phone]  Plan Of Care/Follow-up recommendations:  Activity:  patient will follow up with  outpatient appointments and continue current medications  Wynelle Bourgeois, MD 01/16/2016, 12:46 PM

## 2016-07-23 ENCOUNTER — Encounter (HOSPITAL_COMMUNITY): Payer: Self-pay | Admitting: *Deleted

## 2016-09-07 ENCOUNTER — Emergency Department (HOSPITAL_COMMUNITY)
Admission: EM | Admit: 2016-09-07 | Discharge: 2016-09-07 | Disposition: A | Discharge status: Home / Self Care | Payer: No Typology Code available for payment source | Attending: Emergency Medicine | Admitting: Emergency Medicine

## 2016-09-07 ENCOUNTER — Encounter (HOSPITAL_COMMUNITY): Payer: Self-pay

## 2016-09-07 DIAGNOSIS — Y9389 Activity, other specified: Secondary | ICD-10-CM | POA: Insufficient documentation

## 2016-09-07 DIAGNOSIS — E039 Hypothyroidism, unspecified: Secondary | ICD-10-CM | POA: Insufficient documentation

## 2016-09-07 DIAGNOSIS — S0990XA Unspecified injury of head, initial encounter: Secondary | ICD-10-CM | POA: Diagnosis present

## 2016-09-07 DIAGNOSIS — Y92410 Unspecified street and highway as the place of occurrence of the external cause: Secondary | ICD-10-CM | POA: Diagnosis not present

## 2016-09-07 DIAGNOSIS — G44209 Tension-type headache, unspecified, not intractable: Secondary | ICD-10-CM | POA: Diagnosis not present

## 2016-09-07 DIAGNOSIS — I10 Essential (primary) hypertension: Secondary | ICD-10-CM | POA: Diagnosis not present

## 2016-09-07 DIAGNOSIS — Y999 Unspecified external cause status: Secondary | ICD-10-CM | POA: Insufficient documentation

## 2016-09-07 MED ORDER — KETOROLAC TROMETHAMINE 60 MG/2ML IM SOLN
60.0000 mg | Freq: Once | INTRAMUSCULAR | Status: AC
  Administered 2016-09-07: 60 mg via INTRAMUSCULAR
  Filled 2016-09-07: qty 2
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
## 2018-03-22 ENCOUNTER — Emergency Department (HOSPITAL_COMMUNITY)
Admission: EM | Admit: 2018-03-22 | Discharge: 2018-03-23 | Disposition: A | Discharge status: Home / Self Care | Payer: Medicaid Other | Attending: Emergency Medicine | Admitting: Emergency Medicine

## 2018-03-22 ENCOUNTER — Emergency Department (HOSPITAL_COMMUNITY): Payer: Medicaid Other

## 2018-03-22 DIAGNOSIS — I10 Essential (primary) hypertension: Secondary | ICD-10-CM | POA: Diagnosis not present

## 2018-03-22 DIAGNOSIS — Z79899 Other long term (current) drug therapy: Secondary | ICD-10-CM | POA: Diagnosis not present

## 2018-03-22 DIAGNOSIS — M79671 Pain in right foot: Secondary | ICD-10-CM

## 2018-03-22 NOTE — ED Triage Notes (Signed)
Pt states that she has been having R foot pain for the past two days, denies injury, hurts to walk

## 2018-03-23 NOTE — Discharge Instructions (Signed)

## 2018-03-23 NOTE — ED Provider Notes (Signed)
[facility] EMERGENCY DEPARTMENT Provider Note   CSN: [id number] Arrival date & time: 03/22/18  2246     History   Chief Complaint Chief Complaint  Patient presents with  . Foot Pain    HPI Grace [name] is a 32 y.o. female who presents today for evaluation of 2 days of right foot pain.  This is atraumatic.  She recently started a job with [employer] where she is walking more.  She has not gotten any new shoes recently, however does note that the shoes that she wears do not have good arch support.  HPI  Past Medical History:  Diagnosis Date  . Cholecystitis 08/18/2011  . Headache(784.0)    otc med prn  . History of recurrent UTIs    recurrent UTIs  . Hypertension   . Hyperthyroidism   . Joint pain    knees and shoulders - otc med prn  . Sickle cell trait (HCC)   . Sickle cell trait (HCC)   . SVD (spontaneous vaginal delivery)    x 3 [facility]    Patient Active Problem List   Diagnosis Date Noted  . Major depressive disorder, single episode, severe without psychotic features (HCC) 01/05/2016  . Severe episode of recurrent major depressive disorder, with psychotic features (HCC)   . MDD (major depressive disorder), recurrent episode, severe (HCC) 04/15/2015  . Hyperthyroidism 03/21/2015  . Tachycardia 03/21/2015  . Palpitations 03/21/2015  . Chest pain 03/21/2015  . MDD (major depressive disorder), recurrent episode, moderate (HCC) 03/16/2015  . Consultation for female sterilization 05/09/2013  . NSVD (normal spontaneous vaginal delivery) 03/30/2013  . Chronic hypertension in pregnancy 03/29/2013  . HTN in pregnancy, chronic 03/28/2013  . Transient hypertension of pregnancy, antepartum 03/20/2013  . GERD (gastroesophageal reflux disease) 01/18/2013  . Hyperemesis gravidarum before end of [redacted] week gestation with electrolyte imbalance 09/23/2012  . Trichomonas 09/23/2012  . Supervision of high-risk pregnancy 04/19/2011  . History of molar pregnancy,  antepartum 03/22/2011  . Hyperthyroidism, antepartum 03/16/2011  . Sickle cell trait (HCC) 12/30/2010    Past Surgical History:  Procedure Laterality Date  . CHOLECYSTECTOMY  08/19/2011   Procedure: LAPAROSCOPIC CHOLECYSTECTOMY WITH INTRAOPERATIVE CHOLANGIOGRAM;  Surgeon: Lodema Pilot, DO;  Location: [facility] OR;  Service: General;  Laterality: N/A;  . DILATION AND CURETTAGE OF UTERUS     molar preg  . LAPAROSCOPIC BILATERAL SALPINGECTOMY Bilateral 05/09/2013   Procedure: LAPAROSCOPIC BILATERAL SALPINGECTOMY;  Surgeon: Allie Bossier, MD;  Location: [facility] OR[facility];  Service: Gynecology;  Laterality: Bilateral;     OB History    Gravida  4   Para  3   Term  3   Preterm      AB  1   Living  3     SAB  1   TAB      Ectopic      Multiple      Live Births  3            Home Medications    Prior to Admission medications   Medication Sig Start Date End Date Taking? Authorizing Provider  buPROPion (WELLBUTRIN XL) 150 MG 24 hr tablet Take 1 tablet (150 mg total) by mouth daily. For depression 01/17/16   Armandina Stammer I, NP  citalopram (CELEXA) 10 MG tablet Take 3 tablets (30 mg total) by mouth daily. For depression 01/17/16   Armandina Stammer I, NP  hydrOXYzine (ATARAX/VISTARIL) 50 MG tablet Take 1 tablet (50 mg total) by mouth every 8 (eight)  hours as needed for anxiety. 01/16/16   Armandina StammerNwoko, Agnes I, NP  methimazole (TAPAZOLE) 10 MG tablet Take 2 tablets (20 mg total) by mouth 2 (two) times daily. For hyperthyroidim 01/16/16   Armandina StammerNwoko, Agnes I, NP  metoprolol tartrate (LOPRESSOR) 25 MG tablet Take 0.5 tablets (12.5 mg total) by mouth 2 (two) times daily. For high blood pressure 01/16/16   Nwoko, Nicole KindredAgnes I, NP  QUEtiapine (SEROQUEL) 25 MG tablet Take 1 tablet (25 mg total) by mouth daily. For agitation 01/17/16   Armandina StammerNwoko, Agnes I, NP  QUEtiapine (SEROQUEL) 50 MG tablet Take 5 tablets (250 mg total) by mouth at bedtime. For mood control 01/16/16   Sanjuana KavaNwoko, Agnes I, NP    Family History Family History  Problem  Relation Age of Onset  . Diabetes Mother   . Cancer Father   . Diabetes Maternal Uncle   . Cancer Maternal Grandmother   . Cancer Maternal Grandfather   . Hypertension Maternal Grandfather   . Diabetes Maternal Grandfather   . Sickle cell anemia Son   . Asthma Son   . Heart disease Paternal Grandfather   . Hearing loss Paternal Aunt     Social History Social History   Tobacco Use  . Smoking status: Never Smoker  . Smokeless tobacco: Never Used  Substance Use Topics  . Alcohol use: No  . Drug use: No     Allergies   Patient has no known allergies.   Review of Systems Review of Systems  Constitutional: Negative for chills and fever.  Musculoskeletal:       Right foot pain.   Skin: Negative for color change and wound.  Neurological: Negative for weakness and numbness.  All other systems reviewed and are negative.    Physical Exam Updated Vital Signs BP 101/74 (BP Location: Right Arm)   Pulse 89   Temp 99 F (37.2 C) (Oral)   Resp 16   LMP 03/08/2018   SpO2 100%   Physical Exam  Constitutional: She appears well-developed. No distress.  HENT:  Head: Normocephalic.  Cardiovascular: Normal rate and intact distal pulses.  Right foot 2+ DP/PT pulse.  Musculoskeletal: Normal range of motion.  Right foot does not have obvious edema erythema abnormal warmth or crepitus.  There is tenderness to palpation along the arch of the right foot.    Neurological:  Sensation intact to right foot.  Skin: She is not diaphoretic.  No wounds, abnormal erythema, ecchymosis or other skin abnormality of the right foot.  Nursing note and vitals reviewed.    ED Treatments / Results  Labs (all labs ordered are listed, but only abnormal results are displayed) Labs Reviewed - No data to display  EKG None  Radiology Dg Foot Complete Right  Result Date: 03/22/2018 CLINICAL DATA:  32 year old female with right foot pain. EXAM: RIGHT FOOT COMPLETE - 3+ VIEW COMPARISON:  Right  foot radiograph dated 01/15/2006 FINDINGS: There is no evidence of fracture or dislocation. There is no evidence of arthropathy or other focal bone abnormality. Soft tissues are unremarkable. IMPRESSION: Negative. Electronically Signed   By: Elgie CollardArash  Radparvar M.D.   On: 03/22/2018 23:36    Procedures Procedures (including critical care time)  Medications Ordered in ED Medications - No data to display   Initial Impression / Assessment and Plan / ED Course  I have reviewed the triage vital signs and the nursing notes.  Pertinent labs & imaging results that were available during my care of the patient were reviewed by me  and considered in my medical decision making (see chart for details).    Patient presents today for evaluation of a few days of right foot pain.  This started after she got a new job that requires her to walk long distances.  X-rays do not show evidence of fracture or other abnormality.  No deformities, crepitus abnormal warmth, skin changes.  Recommended getting shoes with good arch support or getting insoles for her shoes, podiatry follow-up if this does not improve in 1 week.  Return precautions were discussed with patient who states their understanding.  At the time of discharge patient denied any unaddressed complaints or concerns.  Patient is agreeable for discharge home.   Final Clinical Impressions(s) / ED Diagnoses   Final diagnoses:  Right foot pain    ED Discharge Orders    None       Norman Clay 03/23/18 1610    Dione Booze, MD 03/23/18 [phone]

## 2018-04-26 ENCOUNTER — Encounter (HOSPITAL_COMMUNITY): Payer: Self-pay

## 2018-04-26 ENCOUNTER — Emergency Department (HOSPITAL_COMMUNITY)
Admission: EM | Admit: 2018-04-26 | Discharge: 2018-04-27 | Disposition: A | Discharge status: Home / Self Care | Payer: Medicaid Other | Attending: Emergency Medicine | Admitting: Emergency Medicine

## 2018-04-26 DIAGNOSIS — J111 Influenza due to unidentified influenza virus with other respiratory manifestations: Secondary | ICD-10-CM | POA: Diagnosis not present

## 2018-04-26 DIAGNOSIS — I10 Essential (primary) hypertension: Secondary | ICD-10-CM | POA: Diagnosis not present

## 2018-04-26 DIAGNOSIS — R69 Illness, unspecified: Secondary | ICD-10-CM

## 2018-04-26 DIAGNOSIS — R112 Nausea with vomiting, unspecified: Secondary | ICD-10-CM | POA: Diagnosis not present

## 2018-04-26 DIAGNOSIS — R059 Cough, unspecified: Secondary | ICD-10-CM

## 2018-04-26 DIAGNOSIS — Z79899 Other long term (current) drug therapy: Secondary | ICD-10-CM | POA: Diagnosis not present

## 2018-04-26 DIAGNOSIS — D573 Sickle-cell trait: Secondary | ICD-10-CM | POA: Diagnosis not present

## 2018-04-26 DIAGNOSIS — R05 Cough: Secondary | ICD-10-CM | POA: Diagnosis present

## 2018-04-26 LAB — COMPREHENSIVE METABOLIC PANEL
ALT: 14 U/L (ref 0–44)
AST: 23 U/L (ref 15–41)
Albumin: 4.2 g/dL (ref 3.5–5.0)
Alkaline Phosphatase: 39 U/L (ref 38–126)
Anion gap: 10 (ref 5–15)
BILIRUBIN TOTAL: 0.6 mg/dL (ref 0.3–1.2)
CO2: 22 mmol/L (ref 22–32)
CREATININE: 0.93 mg/dL (ref 0.44–1.00)
Calcium: 8.9 mg/dL (ref 8.9–10.3)
Chloride: 102 mmol/L (ref 98–111)
GFR calc Af Amer: >60 mL/min (ref >60)
GFR calc non Af Amer: >60 mL/min (ref >60)
Glucose, Bld: 94 mg/dL (ref 70–99)
Potassium: 3.5 mmol/L (ref 3.5–5.1)
Sodium: 134 mmol/L — ABNORMAL LOW (ref 135–145)
Total Protein: 7.8 g/dL (ref 6.5–8.1)

## 2018-04-26 LAB — URINALYSIS, ROUTINE W REFLEX MICROSCOPIC
Bilirubin Urine: NEGATIVE
Glucose, UA: NEGATIVE mg/dL
Ketones, ur: NEGATIVE mg/dL
Nitrite: NEGATIVE
Protein, ur: 30 mg/dL — AB
Specific Gravity, Urine: 1.014 (ref 1.005–1.030)
pH: 5 (ref 5.0–8.0)

## 2018-04-26 LAB — CBC
HCT: 39.5 % (ref 36.0–46.0)
Hemoglobin: 13.9 g/dL (ref 12.0–15.0)
MCH: 32.3 pg (ref 26.0–34.0)
MCHC: 35.2 g/dL (ref 30.0–36.0)
MCV: 91.6 fL (ref 80.0–100.0)
Platelets: 285 10*3/uL (ref 150–400)
RBC: 4.31 MIL/uL (ref 3.87–5.11)
RDW: 12.4 % (ref 11.5–15.5)
WBC: 3.7 10*3/uL — ABNORMAL LOW (ref 4.0–10.5)
nRBC: 0 % (ref 0.0–0.2)

## 2018-04-26 LAB — I-STAT BETA HCG BLOOD, ED (MC, WL, AP ONLY)

## 2018-04-26 LAB — LIPASE, BLOOD: LIPASE: 30 U/L (ref 11–51)

## 2018-04-26 MED ORDER — ACETAMINOPHEN 325 MG PO TABS
650.0000 mg | ORAL_TABLET | Freq: Once | ORAL | Status: AC
  Administered 2018-04-26: 650 mg via ORAL
  Filled 2018-04-26: qty 2

## 2018-04-26 NOTE — ED Triage Notes (Signed)
Pt reports abd pain, chest pain, productive cough and emesis. Pt states she cannot keep anything down. Generalized body aches.

## 2018-04-27 MED ORDER — IPRATROPIUM-ALBUTEROL 0.5-2.5 (3) MG/3ML IN SOLN
3.0000 mL | Freq: Once | RESPIRATORY_TRACT | Status: AC
  Administered 2018-04-27: 3 mL via RESPIRATORY_TRACT
  Filled 2018-04-27: qty 3

## 2018-04-27 MED ORDER — METOCLOPRAMIDE HCL 5 MG/ML IJ SOLN
10.0000 mg | Freq: Once | INTRAMUSCULAR | Status: AC
  Administered 2018-04-27: 10 mg via INTRAVENOUS
  Filled 2018-04-27: qty 2

## 2018-04-27 MED ORDER — SODIUM CHLORIDE 0.9 % IV BOLUS
1000.0000 mL | Freq: Once | INTRAVENOUS | Status: AC
  Administered 2018-04-27: 1000 mL via INTRAVENOUS

## 2018-04-27 MED ORDER — ONDANSETRON HCL 4 MG/2ML IJ SOLN
4.0000 mg | Freq: Once | INTRAMUSCULAR | Status: AC
  Administered 2018-04-27: 4 mg via INTRAVENOUS
  Filled 2018-04-27: qty 2

## 2018-04-27 MED ORDER — DEXAMETHASONE SODIUM PHOSPHATE 10 MG/ML IJ SOLN
10.0000 mg | Freq: Once | INTRAMUSCULAR | Status: AC
  Administered 2018-04-27: 10 mg via INTRAVENOUS
  Filled 2018-04-27: qty 1

## 2018-04-27 MED ORDER — KETOROLAC TROMETHAMINE 30 MG/ML IJ SOLN
30.0000 mg | Freq: Once | INTRAMUSCULAR | Status: AC
  Administered 2018-04-27: 30 mg via INTRAVENOUS
  Filled 2018-04-27: qty 1

## 2018-04-27 MED ORDER — DIPHENHYDRAMINE HCL 50 MG/ML IJ SOLN
25.0000 mg | Freq: Once | INTRAMUSCULAR | Status: AC
  Administered 2018-04-27: 25 mg via INTRAVENOUS
  Filled 2018-04-27: qty 1

## 2018-04-27 MED ORDER — METOCLOPRAMIDE HCL 10 MG PO TABS: 10.0000 mg | ORAL_TABLET | Freq: Four times a day (QID) | ORAL | 0 refills | Status: DC | PRN

## 2018-04-27 MED ORDER — ACETAMINOPHEN 325 MG PO TABS
650.0000 mg | ORAL_TABLET | Freq: Once | ORAL | Status: AC
  Administered 2018-04-27: 650 mg via ORAL
  Filled 2018-04-27: qty 2

## 2018-04-27 NOTE — ED Provider Notes (Signed)
[facility]   CSN: [id number] Arrival date & time: 04/26/18  2150     History   Chief Complaint Chief Complaint  Patient presents with  . Abdominal Pain  . Chest Pain  . Cough    HPI Grace [name] is a 33 y.o. female.  The history is provided by the patient.  Abdominal Pain  Associated symptoms: chest pain and cough   Chest Pain  Associated symptoms: abdominal pain and cough   Cough  Associated symptoms: chest pain   She has history of sickle cell trait, depression, hypothyroidism and comes in with cough and vomiting for the last 3 days.  She is not been able to hold anything down.  Cough is been nonproductive.  She has had subjective fever as well as chills and sweats.  There is been a small amount of diarrhea.  She has had arthralgias and myalgias.  She is complaining of some soreness in the lower sternal area when she coughs.  She has had sick contacts at work.  She has not had the influenza immunization this season.  Past Medical History:  Diagnosis Date  . Cholecystitis 08/18/2011  . Headache(784.0)    otc med prn  . History of recurrent UTIs    recurrent UTIs  . Hypertension   . Hyperthyroidism   . Joint pain    knees and shoulders - otc med prn  . Sickle cell trait (HCC)   . Sickle cell trait (HCC)   . SVD (spontaneous vaginal delivery)    x 3 [facility]    Patient Active Problem List   Diagnosis Date Noted  . Major depressive disorder, single episode, severe without psychotic features (HCC) 01/05/2016  . Severe episode of recurrent major depressive disorder, with psychotic features (HCC)   . MDD (major depressive disorder), recurrent episode, severe (HCC) 04/15/2015  . Hyperthyroidism 03/21/2015  . Tachycardia 03/21/2015  . Palpitations 03/21/2015  . Chest pain 03/21/2015  . MDD (major depressive disorder), recurrent episode, moderate (HCC) 03/16/2015  . Consultation for female sterilization 05/09/2013    . NSVD (normal spontaneous vaginal delivery) 03/30/2013  . Chronic hypertension in pregnancy 03/29/2013  . HTN in pregnancy, chronic 03/28/2013  . Transient hypertension of pregnancy, antepartum 03/20/2013  . GERD (gastroesophageal reflux disease) 01/18/2013  . Hyperemesis gravidarum before end of [redacted] week gestation with electrolyte imbalance 09/23/2012  . Trichomonas 09/23/2012  . Supervision of high-risk pregnancy 04/19/2011  . History of molar pregnancy, antepartum 03/22/2011  . Hyperthyroidism, antepartum 03/16/2011  . Sickle cell trait (HCC) 12/30/2010    Past Surgical History:  Procedure Laterality Date  . CHOLECYSTECTOMY  08/19/2011   Procedure: LAPAROSCOPIC CHOLECYSTECTOMY WITH INTRAOPERATIVE CHOLANGIOGRAM;  Surgeon: Lodema PilotBrian Layton, DO;  Location: [facility] OR;  Service: General;  Laterality: N/A;  . DILATION AND CURETTAGE OF UTERUS     molar preg  . LAPAROSCOPIC BILATERAL SALPINGECTOMY Bilateral 05/09/2013   Procedure: LAPAROSCOPIC BILATERAL SALPINGECTOMY;  Surgeon: Allie BossierMyra C Dove, MD;  Location: [facility] ORS;  Service: Gynecology;  Laterality: Bilateral;     OB History    Gravida  4   Para  3   Term  3   Preterm      AB  1   Living  3     SAB  1   TAB      Ectopic      Multiple      Live Births  3  Home Medications    Prior to Admission medications   Medication Sig Start Date End Date Taking? Authorizing Provider  buPROPion (WELLBUTRIN XL) 150 MG 24 hr tablet Take 1 tablet (150 mg total) by mouth daily. For depression 01/17/16   Armandina Stammer I, NP  citalopram (CELEXA) 10 MG tablet Take 3 tablets (30 mg total) by mouth daily. For depression 01/17/16   Armandina Stammer I, NP  hydrOXYzine (ATARAX/VISTARIL) 50 MG tablet Take 1 tablet (50 mg total) by mouth every 8 (eight) hours as needed for anxiety. 01/16/16   Armandina Stammer I, NP  methimazole (TAPAZOLE) 10 MG tablet Take 2 tablets (20 mg total) by mouth 2 (two) times daily. For hyperthyroidim 01/16/16   Armandina Stammer I,  NP  metoprolol tartrate (LOPRESSOR) 25 MG tablet Take 0.5 tablets (12.5 mg total) by mouth 2 (two) times daily. For high blood pressure 01/16/16   Nwoko, Nicole Kindred I, NP  QUEtiapine (SEROQUEL) 25 MG tablet Take 1 tablet (25 mg total) by mouth daily. For agitation 01/17/16   Armandina Stammer I, NP  QUEtiapine (SEROQUEL) 50 MG tablet Take 5 tablets (250 mg total) by mouth at bedtime. For mood control 01/16/16   Sanjuana Kava, NP    Family History Family History  Problem Relation Age of Onset  . Diabetes Mother   . Cancer Father   . Diabetes Maternal Uncle   . Cancer Maternal Grandmother   . Cancer Maternal Grandfather   . Hypertension Maternal Grandfather   . Diabetes Maternal Grandfather   . Sickle cell anemia Son   . Asthma Son   . Heart disease Paternal Grandfather   . Hearing loss Paternal Aunt     Social History Social History   Tobacco Use  . Smoking status: Never Smoker  . Smokeless tobacco: Never Used  Substance Use Topics  . Alcohol use: No  . Drug use: No     Allergies   Patient has no known allergies.   Review of Systems Review of Systems  Respiratory: Positive for cough.   Cardiovascular: Positive for chest pain.  Gastrointestinal: Positive for abdominal pain.  All other systems reviewed and are negative.    Physical Exam Updated Vital Signs BP 130/82   Pulse (!) 116   Temp (!) 102.4 F (39.1 C) (Oral)   Resp 18   SpO2 99%   Physical Exam Vitals signs and nursing note reviewed.    33 year old female, resting comfortably and in no acute distress. Vital signs are significant for fever, tachycardia. Oxygen saturation is 99%, which is normal. Head is normocephalic and atraumatic. PERRLA, EOMI. Oropharynx is clear. Neck is nontender and supple without adenopathy or JVD. Back is nontender and there is no CVA tenderness. Lungs are clear without rales, wheezes, or rhonchi.  Wheezing is produced by forced exhalation. Chest is nontender. Heart has regular rate  and rhythm without murmur. Abdomen is soft, flat, with mild epigastric tenderness.  There is no rebound or guarding.  There are no masses or hepatosplenomegaly and peristalsis is normhypExtremities have no cyanosis or edema, full range of motion is present. Skin is warm and dry without rash. Neurologic: Mental status is normal, cranial nerves are intact, there are no motor or sensory deficits.  ED Treatments / Results  Labs (all labs ordered are listed, but only abnormal results are displayed) Labs Reviewed  COMPREHENSIVE METABOLIC PANEL - Abnormal; Notable for the following components:      Result Value   Sodium 134 (*)  BUN <5 (*)    All other components within normal limits  CBC - Abnormal; Notable for the following components:   WBC 3.7 (*)    All other components within normal limits  URINALYSIS, ROUTINE W REFLEX MICROSCOPIC - Abnormal; Notable for the following components:   APPearance CLOUDY (*)    Hgb urine dipstick SMALL (*)    Protein, ur 30 (*)    Leukocytes, UA SMALL (*)    Bacteria, UA MANY (*)    All other components within normal limits  LIPASE, BLOOD  I-STAT BETA HCG BLOOD, ED ([facility], [facility], [facility] ONLY)    EKG EKG Interpretation  Date/Time:  Wednesday April 26 2018 21:53:43 EST Ventricular Rate:  123 PR Interval:  136 QRS Duration: 70 QT Interval:  300 QTC Calculation: 429 R Axis:   35 Text Interpretation:  Sinus tachycardia Right atrial enlargement Nonspecific T wave abnormality Abnormal ECG When compared with ECG of 04/14/2015, No significant change was found Confirmed by Dione Booze ([id number]) on 04/27/2018 12:35:40 AM  Procedures Procedures   Medications Ordered in ED Medications  acetaminophen (TYLENOL) tablet 650 mg (650 mg Oral Given 04/26/18 2204)     Initial Impression / Assessment and Plan / ED Course  I have reviewed the triage vital signs and the nursing notes.  Pertinent lab results that were available during my care of the patient were reviewed  by me and considered in my medical decision making (see chart for details).  Fever, cough, vomiting consistent with a viral illness.  No red flags to suggest more serious illness.  Possible influenza, but she is outside the treatment window for antivirals, so influenza testing is not done.  Old records are reviewed, and she does have a prior ED visit for chest pain.  We will give IV fluids, ondansetron, ketorolac.  Will give therapeutic trial of albuterol with ipratropium to see if it helps her cough.  Patient did not notice any improvement with above-noted treatment.  She is given additional fluids, additional nebulizer treatment, and is given metoclopramide.  Following that, nausea is improved, but she continues to have cough.  It is felt that she is not responding to beta agonist, so will not be sent home with albuterol.  She is discharged with prescription for metoclopramide, advised to force fluids, take over-the-counter NSAIDs or acetaminophen as needed.  Return precautions discussed.  Final Clinical Impressions(s) / ED Diagnoses   Final diagnoses:  Influenza-like illness  Non-intractable vomiting with nausea, unspecified vomiting type  Cough    ED Discharge Orders         Ordered    metoCLOPramide (REGLAN) 10 MG tablet  Every 6 hours PRN     04/27/18 0715           Dione Booze, MD 04/27/18 [id number]

## 2018-04-27 NOTE — Discharge Instructions (Signed)
Return if symptoms are getting worse. °

## 2018-04-27 NOTE — ED Notes (Signed)
D/c reviewed with patient 

## 2018-04-27 NOTE — ED Notes (Signed)
Pt feels no better

## 2018-04-27 NOTE — ED Notes (Signed)
Ill with a cold for a few days alert no distress

## 2018-05-02 ENCOUNTER — Emergency Department (HOSPITAL_COMMUNITY): Payer: Medicaid Other

## 2018-05-02 ENCOUNTER — Emergency Department (HOSPITAL_COMMUNITY)
Admission: EM | Admit: 2018-05-02 | Discharge: 2018-05-03 | Disposition: A | Discharge status: Home / Self Care | Payer: Medicaid Other | Attending: Emergency Medicine | Admitting: Emergency Medicine

## 2018-05-02 ENCOUNTER — Encounter (HOSPITAL_COMMUNITY): Payer: Self-pay | Admitting: Emergency Medicine

## 2018-05-02 ENCOUNTER — Other Ambulatory Visit: Payer: Self-pay

## 2018-05-02 DIAGNOSIS — I10 Essential (primary) hypertension: Secondary | ICD-10-CM | POA: Diagnosis not present

## 2018-05-02 DIAGNOSIS — R197 Diarrhea, unspecified: Secondary | ICD-10-CM

## 2018-05-02 DIAGNOSIS — N3000 Acute cystitis without hematuria: Secondary | ICD-10-CM | POA: Diagnosis not present

## 2018-05-02 DIAGNOSIS — R112 Nausea with vomiting, unspecified: Secondary | ICD-10-CM

## 2018-05-02 DIAGNOSIS — J181 Lobar pneumonia, unspecified organism: Secondary | ICD-10-CM | POA: Insufficient documentation

## 2018-05-02 DIAGNOSIS — D573 Sickle-cell trait: Secondary | ICD-10-CM | POA: Insufficient documentation

## 2018-05-02 DIAGNOSIS — Z79899 Other long term (current) drug therapy: Secondary | ICD-10-CM | POA: Diagnosis not present

## 2018-05-02 DIAGNOSIS — J189 Pneumonia, unspecified organism: Secondary | ICD-10-CM

## 2018-05-02 LAB — COMPREHENSIVE METABOLIC PANEL
ALT: 25 U/L (ref 0–44)
AST: 37 U/L (ref 15–41)
Albumin: 3.8 g/dL (ref 3.5–5.0)
Alkaline Phosphatase: 36 U/L — ABNORMAL LOW (ref 38–126)
Anion gap: 14 (ref 5–15)
BUN: 7 mg/dL (ref 6–20)
CO2: 24 mmol/L (ref 22–32)
Calcium: 8.7 mg/dL — ABNORMAL LOW (ref 8.9–10.3)
Chloride: 95 mmol/L — ABNORMAL LOW (ref 98–111)
Creatinine, Ser: 0.9 mg/dL (ref 0.44–1.00)
GFR calc Af Amer: >60 mL/min (ref >60)
GFR calc non Af Amer: >60 mL/min (ref >60)
Glucose, Bld: 125 mg/dL — ABNORMAL HIGH (ref 70–99)
Potassium: 3.2 mmol/L — ABNORMAL LOW (ref 3.5–5.1)
Sodium: 133 mmol/L — ABNORMAL LOW (ref 135–145)
Total Bilirubin: 1.2 mg/dL (ref 0.3–1.2)
Total Protein: 8.3 g/dL — ABNORMAL HIGH (ref 6.5–8.1)

## 2018-05-02 LAB — URINALYSIS, ROUTINE W REFLEX MICROSCOPIC
Bilirubin Urine: NEGATIVE
Glucose, UA: NEGATIVE mg/dL
Ketones, ur: 20 mg/dL — AB
Nitrite: NEGATIVE
Protein, ur: 100 mg/dL — AB
Specific Gravity, Urine: 1.015 (ref 1.005–1.030)
pH: 5 (ref 5.0–8.0)

## 2018-05-02 LAB — CBC
HCT: 41.7 % (ref 36.0–46.0)
HEMOGLOBIN: 14.6 g/dL (ref 12.0–15.0)
MCH: 31.5 pg (ref 26.0–34.0)
MCHC: 35 g/dL (ref 30.0–36.0)
MCV: 89.9 fL (ref 80.0–100.0)
Platelets: 260 10*3/uL (ref 150–400)
RBC: 4.64 MIL/uL (ref 3.87–5.11)
RDW: 12.2 % (ref 11.5–15.5)
WBC: 14.7 10*3/uL — ABNORMAL HIGH (ref 4.0–10.5)
nRBC: 0 % (ref 0.0–0.2)

## 2018-05-02 LAB — I-STAT BETA HCG BLOOD, ED (MC, WL, AP ONLY): I-stat hCG, quantitative: <5 m[IU]/mL (ref <5)

## 2018-05-02 LAB — LIPASE, BLOOD: Lipase: 23 U/L (ref 11–51)

## 2018-05-02 MED ORDER — PROMETHAZINE HCL 25 MG/ML IJ SOLN
12.5000 mg | Freq: Once | INTRAMUSCULAR | Status: AC
  Administered 2018-05-02: 12.5 mg via INTRAVENOUS
  Filled 2018-05-02: qty 1

## 2018-05-02 MED ORDER — KETOROLAC TROMETHAMINE 30 MG/ML IJ SOLN
30.0000 mg | Freq: Once | INTRAMUSCULAR | Status: AC
  Administered 2018-05-02: 30 mg via INTRAVENOUS
  Filled 2018-05-02: qty 1

## 2018-05-02 MED ORDER — SODIUM CHLORIDE 0.9 % IV SOLN
1.0000 g | Freq: Once | INTRAVENOUS | Status: AC
  Administered 2018-05-02: 1 g via INTRAVENOUS
  Filled 2018-05-02: qty 10

## 2018-05-02 MED ORDER — IOPAMIDOL (ISOVUE-300) INJECTION 61%
100.0000 mL | Freq: Once | INTRAVENOUS | Status: AC | PRN
  Administered 2018-05-02: 100 mL via INTRAVENOUS

## 2018-05-02 MED ORDER — SODIUM CHLORIDE 0.9 % IV BOLUS
1000.0000 mL | Freq: Once | INTRAVENOUS | Status: AC
  Administered 2018-05-02: 1000 mL via INTRAVENOUS

## 2018-05-02 MED ORDER — POTASSIUM CHLORIDE CRYS ER 20 MEQ PO TBCR
40.0000 meq | EXTENDED_RELEASE_TABLET | Freq: Once | ORAL | Status: AC
  Administered 2018-05-02: 40 meq via ORAL
  Filled 2018-05-02: qty 2

## 2018-05-02 MED ORDER — PROMETHAZINE HCL 12.5 MG PO TABS: 12.5000 mg | ORAL_TABLET | Freq: Two times a day (BID) | ORAL | 0 refills | Status: DC

## 2018-05-02 MED ORDER — CEPHALEXIN 500 MG PO CAPS: 500.0000 mg | ORAL_CAPSULE | Freq: Two times a day (BID) | ORAL | 0 refills | Status: AC

## 2018-05-02 MED ORDER — DOXYCYCLINE HYCLATE 100 MG PO CAPS: 100.0000 mg | ORAL_CAPSULE | Freq: Two times a day (BID) | ORAL | 0 refills | Status: AC

## 2018-05-02 NOTE — ED Triage Notes (Signed)
Pt c/o nausea/vomiting x 1 week. Seen for the same and given anit-nausea meds with no relief. Denies urinary symptoms or abdominal pain.

## 2018-05-02 NOTE — ED Notes (Signed)
Pt given po fluids. 

## 2018-05-02 NOTE — ED Notes (Signed)
Pt IV has been taken out.  

## 2018-05-02 NOTE — ED Notes (Signed)
Patient transported to X-ray and CT 

## 2018-05-02 NOTE — Discharge Instructions (Addendum)
Take antibiotics as directed. Please take all of your antibiotics until finished.  As we discussed, your urine today shows signs of infection.  There is also some protein noted in the urine.  This most likely related to infection.  Please follow-up with primary care doctor to have your urine rechecked in 1 week.  Sure you are staying hydrated drink plenty of fluids.  You can use the Phenergan nausea/vomiting.  Do not take Reglan at the same time as Phenergan.  If you take the one again.  Do not take the Reglan.  Return emergency department for high fever, abdominal pain, difficulty breathing, inability eat or drink or any other worsening or concerning symptoms.

## 2018-05-02 NOTE — ED Provider Notes (Signed)
[facility] EMERGENCY DEPARTMENT Provider Note   CSN: [id number] Arrival date & time: 05/02/18  0012     History   Chief Complaint Chief Complaint  Patient presents with  . Emesis    HPI Grace [name] is a 33 y.o. female past medical history of sickle cell trait, hypertension, hypothyroidism, depression who presents for evaluation of continued nausea/vomiting x1 week.  Patient seen here on 04/26/18 for evaluation of same symptoms.  At that time, her symptoms were controlled with Reglan.  She was discharged home after reassuring work-up with antiemetics.  Patient reports that she has been taking the antibiotics at home but states that she is continued to have up to 6 episodes of vomiting per day.  She reports that this happens both with liquids and solids.  She reports she has not been able to keep anything down.  Emesis is nonbloody, nonbilious.  Patient does report that some of the episodes of vomiting do occur after she starts getting a coughing fit.  She reports some difficulty breathing during the vomiting or coughing fit but no difficulty breathing at rest or at other time.  Patient states that she also has had episodes of diarrhea that began yesterday.  She reports approxi-5 episodes.  No blood noted in stools.  No recent travel, antibiotic use.  Patient denies any fevers, abdominal pain, chest pain, dysuria, hematuria.  The history is provided by the patient.    Past Medical History:  Diagnosis Date  . Cholecystitis 08/18/2011  . Headache(784.0)    otc med prn  . History of recurrent UTIs    recurrent UTIs  . Hypertension   . Hyperthyroidism   . Joint pain    knees and shoulders - otc med prn  . Sickle cell trait (HCC)   . Sickle cell trait (HCC)   . SVD (spontaneous vaginal delivery)    x 3 [facility]    Patient Active Problem List   Diagnosis Date Noted  . Major depressive disorder, single episode, severe without psychotic features (HCC) 01/05/2016  .  Severe episode of recurrent major depressive disorder, with psychotic features (HCC)   . MDD (major depressive disorder), recurrent episode, severe (HCC) 04/15/2015  . Hyperthyroidism 03/21/2015  . Tachycardia 03/21/2015  . Palpitations 03/21/2015  . Chest pain 03/21/2015  . MDD (major depressive disorder), recurrent episode, moderate (HCC) 03/16/2015  . Consultation for female sterilization 05/09/2013  . NSVD (normal spontaneous vaginal delivery) 03/30/2013  . Chronic hypertension in pregnancy 03/29/2013  . HTN in pregnancy, chronic 03/28/2013  . Transient hypertension of pregnancy, antepartum 03/20/2013  . GERD (gastroesophageal reflux disease) 01/18/2013  . Hyperemesis gravidarum before end of [redacted] week gestation with electrolyte imbalance 09/23/2012  . Trichomonas 09/23/2012  . Supervision of high-risk pregnancy 04/19/2011  . History of molar pregnancy, antepartum 03/22/2011  . Hyperthyroidism, antepartum 03/16/2011  . Sickle cell trait (HCC) 12/30/2010    Past Surgical History:  Procedure Laterality Date  . CHOLECYSTECTOMY  08/19/2011   Procedure: LAPAROSCOPIC CHOLECYSTECTOMY WITH INTRAOPERATIVE CHOLANGIOGRAM;  Surgeon: Lodema Pilot, DO;  Location: [facility] OR;  Service: General;  Laterality: N/A;  . DILATION AND CURETTAGE OF UTERUS     molar preg  . LAPAROSCOPIC BILATERAL SALPINGECTOMY Bilateral 05/09/2013   Procedure: LAPAROSCOPIC BILATERAL SALPINGECTOMY;  Surgeon: Allie Bossier, MD;  Location: [facility] ORS;  Service: Gynecology;  Laterality: Bilateral;     OB History    Gravida  4   Para  3   Term  3  Preterm      AB  1   Living  3     SAB  1   TAB      Ectopic      Multiple      Live Births  3            Home Medications    Prior to Admission medications   Medication Sig Start Date End Date Taking? Authorizing Provider  buPROPion (WELLBUTRIN XL) 150 MG 24 hr tablet Take 1 tablet (150 mg total) by mouth daily. For depression 01/17/16   Armandina StammerNwoko, Agnes I, NP    cephALEXin (KEFLEX) 500 MG capsule Take 1 capsule (500 mg total) by mouth 2 (two) times daily for 7 days. 05/02/18 05/09/18  Maxwell CaulLayden,  A, PA-C  citalopram (CELEXA) 10 MG tablet Take 3 tablets (30 mg total) by mouth daily. For depression 01/17/16   Armandina StammerNwoko, Agnes I, NP  doxycycline (VIBRAMYCIN) 100 MG capsule Take 1 capsule (100 mg total) by mouth 2 (two) times daily for 7 days. 05/02/18 05/09/18  Maxwell CaulLayden,  A, PA-C  hydrOXYzine (ATARAX/VISTARIL) 50 MG tablet Take 1 tablet (50 mg total) by mouth every 8 (eight) hours as needed for anxiety. 01/16/16   Armandina StammerNwoko, Agnes I, NP  methimazole (TAPAZOLE) 10 MG tablet Take 2 tablets (20 mg total) by mouth 2 (two) times daily. For hyperthyroidim 01/16/16   Armandina StammerNwoko, Agnes I, NP  metoCLOPramide (REGLAN) 10 MG tablet Take 1 tablet (10 mg total) by mouth every 6 (six) hours as needed for nausea (or headache). 04/27/18   Dione BoozeGlick, David, MD  metoprolol tartrate (LOPRESSOR) 25 MG tablet Take 0.5 tablets (12.5 mg total) by mouth 2 (two) times daily. For high blood pressure 01/16/16   Nwoko, Nicole KindredAgnes I, NP  promethazine (PHENERGAN) 12.5 MG tablet Take 1 tablet (12.5 mg total) by mouth 2 (two) times daily. 05/02/18   Maxwell CaulLayden,  A, PA-C  QUEtiapine (SEROQUEL) 25 MG tablet Take 1 tablet (25 mg total) by mouth daily. For agitation Patient taking differently: Take 25 mg by mouth daily as needed (agitation). For agitation 01/17/16   Armandina StammerNwoko, Agnes I, NP  QUEtiapine (SEROQUEL) 50 MG tablet Take 5 tablets (250 mg total) by mouth at bedtime. For mood control 01/16/16   Sanjuana KavaNwoko, Agnes I, NP    Family History Family History  Problem Relation Age of Onset  . Diabetes Mother   . Cancer Father   . Diabetes Maternal Uncle   . Cancer Maternal Grandmother   . Cancer Maternal Grandfather   . Hypertension Maternal Grandfather   . Diabetes Maternal Grandfather   . Sickle cell anemia Son   . Asthma Son   . Heart disease Paternal Grandfather   . Hearing loss Paternal Aunt     Social  History Social History   Tobacco Use  . Smoking status: Never Smoker  . Smokeless tobacco: Never Used  Substance Use Topics  . Alcohol use: No  . Drug use: No     Allergies   Patient has no known allergies.   Review of Systems Review of Systems  Constitutional: Negative for fever.  Respiratory: Negative for cough and shortness of breath.   Cardiovascular: Negative for chest pain.  Gastrointestinal: Positive for diarrhea, nausea and vomiting. Negative for abdominal pain.  Genitourinary: Negative for dysuria and hematuria.  Neurological: Negative for headaches.  All other systems reviewed and are negative.    Physical Exam Updated Vital Signs BP 103/62 (BP Location: Right Arm)   Pulse (!) 103  Temp 98.2 F (36.8 C) (Oral)   Resp 16   SpO2 100%   Physical Exam Vitals signs and nursing note reviewed.  Constitutional:      Appearance: Normal appearance. She is well-developed.     Comments: Appears uncomfortable but no acute distress   HENT:     Head: Normocephalic and atraumatic.  Eyes:     General: Lids are normal.     Conjunctiva/sclera: Conjunctivae normal.     Pupils: Pupils are equal, round, and reactive to light.  Neck:     Musculoskeletal: Full passive range of motion without pain.  Cardiovascular:     Rate and Rhythm: Regular rhythm. Tachycardia present.     Pulses: Normal pulses.     Heart sounds: Normal heart sounds. No murmur. No friction rub. No gallop.   Pulmonary:     Effort: Pulmonary effort is normal.     Breath sounds: Rales present.     Comments: Intermittently coughing. Faint rales noted.  Able speak in full sentences without any difficulty.  No evidence of respiratory distress. Abdominal:     General: Bowel sounds are normal.     Palpations: Abdomen is soft. Abdomen is not rigid.     Tenderness: There is no abdominal tenderness. There is no right CVA tenderness, left CVA tenderness or guarding. Negative signs include McBurney's sign.      Comments: Abdomen is soft, nondistended.  She does exhibit some lower abdominal tenderness.  No rigidity, guarding.  No tenderness noted at McBurney's point.  No CVA tenderness bilaterally.   Musculoskeletal: Normal range of motion.  Skin:    General: Skin is warm and dry.     Capillary Refill: Capillary refill takes less than 2 seconds.  Neurological:     Mental Status: She is alert and oriented to person, place, and time.  Psychiatric:        Speech: Speech normal.      ED Treatments / Results  Labs (all labs ordered are listed, but only abnormal results are displayed) Labs Reviewed  COMPREHENSIVE METABOLIC PANEL - Abnormal; Notable for the following components:      Result Value   Sodium 133 (*)    Potassium 3.2 (*)    Chloride 95 (*)    Glucose, Bld 125 (*)    Calcium 8.7 (*)    Total Protein 8.3 (*)    Alkaline Phosphatase 36 (*)    All other components within normal limits  CBC - Abnormal; Notable for the following components:   WBC 14.7 (*)    All other components within normal limits  URINALYSIS, ROUTINE W REFLEX MICROSCOPIC - Abnormal; Notable for the following components:   APPearance CLOUDY (*)    Hgb urine dipstick SMALL (*)    Ketones, ur 20 (*)    Protein, ur 100 (*)    Leukocytes, UA LARGE (*)    WBC, UA >50 (*)    Bacteria, UA MANY (*)    Non Squamous Epithelial 0-5 (*)    All other components within normal limits  URINE CULTURE  LIPASE, BLOOD  I-STAT BETA HCG BLOOD, ED ([facility], [facility], [facility] ONLY)    EKG None  Radiology Dg Chest 2 View  Result Date: 05/02/2018 CLINICAL DATA:  Vomiting, cough, and weakness EXAM: CHEST - 2 VIEW COMPARISON:  04/14/2015 FINDINGS: Right middle lobe consolidation. No cavitation or effusion. Normal heart size. IMPRESSION: Right middle lobe pneumonia. Electronically Signed   By: Marnee Spring M.D.   On: 05/02/2018 06:51  Ct Abdomen Pelvis W Contrast  Result Date: 05/02/2018 CLINICAL DATA:  Nausea, vomiting. EXAM: CT ABDOMEN AND  PELVIS WITH CONTRAST TECHNIQUE: Multidetector CT imaging of the abdomen and pelvis was performed using the standard protocol following bolus administration of intravenous contrast. CONTRAST:  ISOVUE-300 IOPAMIDOL (ISOVUE-300) INJECTION 61% COMPARISON:  None. FINDINGS: Lower chest: Right middle lobe opacity is noted most consistent with pneumonia. Hepatobiliary: No focal liver abnormality is seen. Status post cholecystectomy. No biliary dilatation. Pancreas: Unremarkable. No pancreatic ductal dilatation or surrounding inflammatory changes. Spleen: Normal in size without focal abnormality. Adrenals/Urinary Tract: Adrenal glands are unremarkable. Kidneys are normal, without renal calculi, focal lesion, or hydronephrosis. Bladder is unremarkable. Stomach/Bowel: Stomach is within normal limits. Appendix appears normal. No evidence of bowel wall thickening, distention, or inflammatory changes. Vascular/Lymphatic: No significant vascular findings are present. No enlarged abdominal or pelvic lymph nodes. Reproductive: Uterus and bilateral adnexa are unremarkable. Other: No abdominal wall hernia or abnormality. No abdominopelvic ascites. Musculoskeletal: No acute or significant osseous findings. IMPRESSION: Right middle lobe pneumonia. No definite abnormality seen in the abdomen or pelvis. Electronically Signed   By: Lupita Raider, M.D.   On: 05/02/2018 07:23    Procedures Procedures (including critical care time)  Medications Ordered in ED Medications  potassium chloride SA (K-DUR,KLOR-CON) CR tablet 40 mEq (has no administration in time range)  cefTRIAXone (ROCEPHIN) 1 g in sodium chloride 0.9 % 100 mL IVPB (0 g Intravenous Stopped 05/02/18 0806)  sodium chloride 0.9 % bolus 1,000 mL (0 mLs Intravenous Stopped 05/02/18 0806)  iopamidol (ISOVUE-300) 61 % injection 100 mL (100 mLs Intravenous Contrast Given 05/02/18 0712)  ketorolac (TORADOL) 30 MG/ML injection 30 mg (30 mg Intravenous Given 05/02/18 0833)   promethazine (PHENERGAN) injection 12.5 mg (12.5 mg Intravenous Given 05/02/18 8588)     Initial Impression / Assessment and Plan / ED Course  I have reviewed the triage vital signs and the nursing notes.  Pertinent labs & imaging results that were available during my care of the patient were reviewed by me and considered in my medical decision making (see chart for details).     33 year old female who presents for evaluation of nausea/vomiting x1 week and diarrhea that began yesterday.  Seen here on 04/26/18 for evaluation of same symptoms.  Was discharged home with medications which she states she has been taking but reports she still symptomatic.  No fevers, abdominal pain, chest pain, difficulty breathing.  Does endorse that she still having productive cough. Patient is afebrile, non-toxic appearing, sitting comfortably on examination table. Vital signs reviewed and stable.  Patient is slightly tachycardic.  Concern for infectious process versus GU process.  Initial labs ordered at triage.  I-STAT beta negative.  Lipase unremarkable.  CBC shows leukocytosis of 14.7.  CMP shows potassium of 3.2, BUN of 7 and creatinine of 0.90.  UA shows large leukocytes, pyuria, many bacteria.  Urine culture sent.  Concern for pyelonephritis.  Will start patient on dose of Rocephin.  Chest x-ray shows right middle lobe pneumonia.  CT abdomen pelvis shows no other acute abnormalities.  Discussed with pharmacy. Patient is on Seroquel so concern for QT prolongation. Will plan to treat with doxycyline and keflex.  Reports still feeling nauseous after Zofran.  Will give small dose of Phenergan.  Reevaluation after Phenergan.  Patient is resting comfortably on bed with no signs of acute distress.  She has been able to drink apple juice with no vomiting.  We will plan to discharge home with  antibiotics for treatment of pneumonia and pyelonephritis.  Patient is currently on Seroquel.  I discussed with pharmacy  regarding nausea treatment for patient.  We discussed that she could have a short course of Phenergan without concern for serotonin syndrome with her Seroquel.   At this time, patient exhibits no emergent life-threatening condition that require further evaluation in ED or admission. Patient had ample opportunity for questions and discussion. All patient's questions were answered with full understanding. Strict return precautions discussed. Patient expresses understanding and agreement to plan.   Portions of this note were generated with Scientist, clinical (histocompatibility and immunogenetics). Dictation errors may occur despite best attempts at proofreading.   Final Clinical Impressions(s) / ED Diagnoses   Final diagnoses:  Community acquired pneumonia of right middle lobe of lung (HCC)  Acute cystitis without hematuria  Nausea vomiting and diarrhea    ED Discharge Orders         Ordered    cephALEXin (KEFLEX) 500 MG capsule  2 times daily     05/02/18 0955    doxycycline (VIBRAMYCIN) 100 MG capsule  2 times daily     05/02/18 0955    promethazine (PHENERGAN) 12.5 MG tablet  2 times daily     05/02/18 0955           Maxwell Caul, PA-C 05/02/18 1545    Gilda Crease, MD 05/03/18 [phone]

## 2018-05-02 NOTE — ED Notes (Signed)
Pt taking po sips and tolerating without vomiting

## 2018-05-03 NOTE — ED Notes (Signed)
Never DC by primary RN 

## 2019-03-09 IMAGING — CT CT ABD-PELV W/ CM
2 of 4 series · 16 of 46 positions shown, 18 images · IV contrast (iopamidol)
Comparison: None.

CLINICAL DATA: Nausea, vomiting.

EXAM:
CT ABDOMEN AND PELVIS WITH CONTRAST
TECHNIQUE: Multidetector CT imaging of the abdomen and pelvis was performed
using the standard protocol following bolus administration of
intravenous contrast.
CONTRAST:  100mL [id number] IOPAMIDOL (LDG74Z-XQQ) INJECTION 61%

[Series 3: a/p w/ 5mm · axial · 0.71mm/px · z∈[+794,+1204]mm · 13 of 93 slices shown, 15 images]
[im 7/93  soft-tissue]
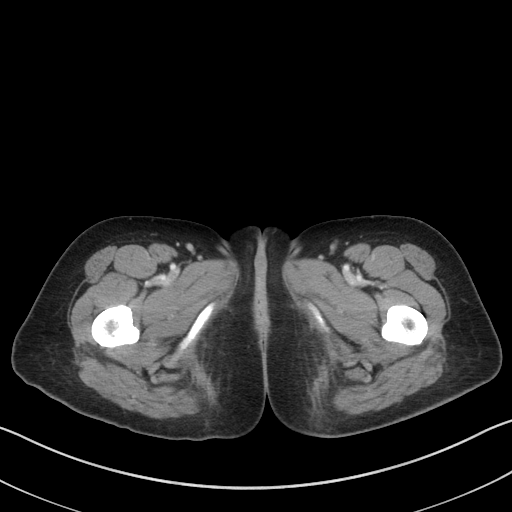
[im 7/93  bone]
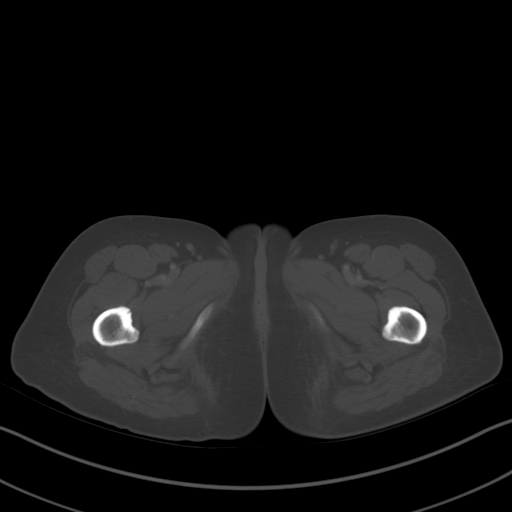
[im 14/93  soft-tissue]
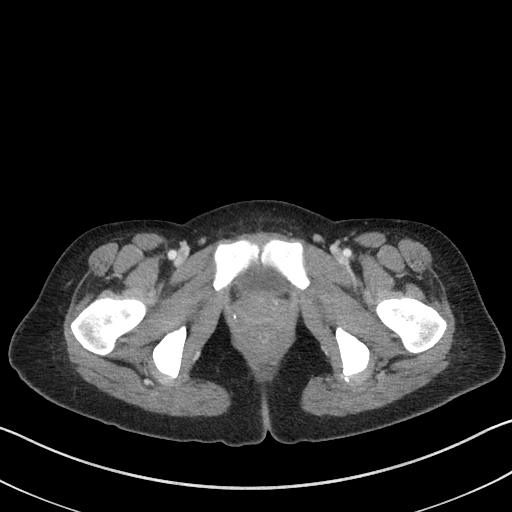
[im 21/93  soft-tissue]
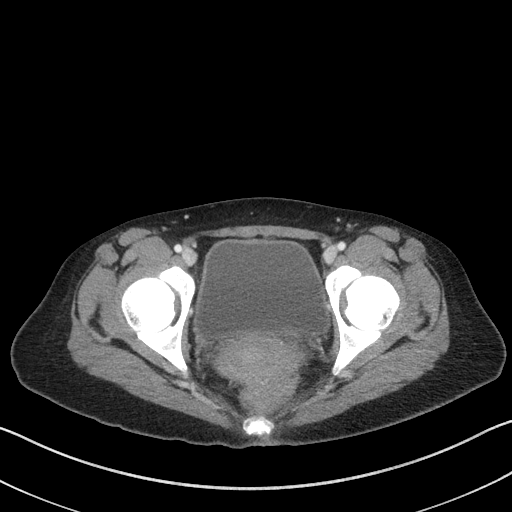
[im 28/93  soft-tissue]
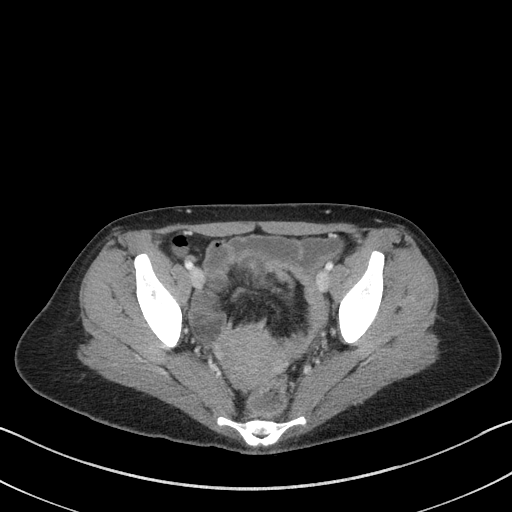
[im 35/93  soft-tissue]
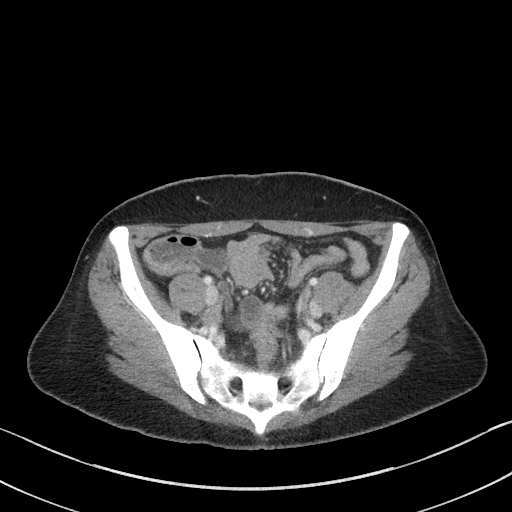
[im 41/93  soft-tissue]
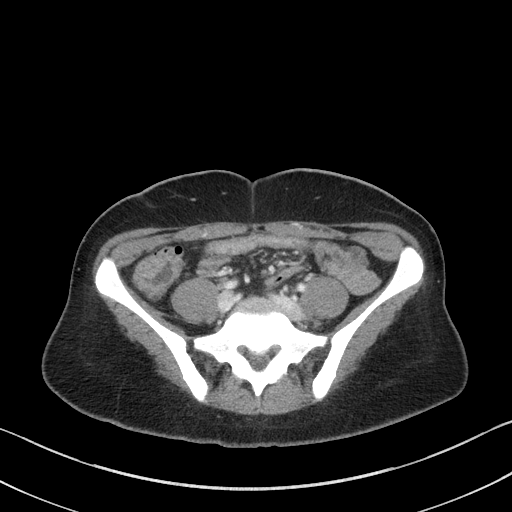
[im 48/93  soft-tissue]
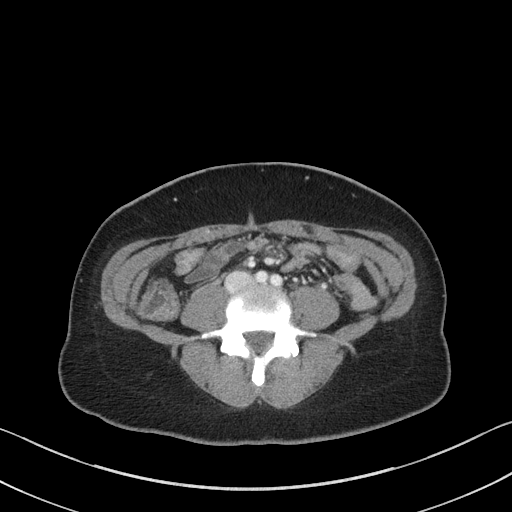
[im 55/93  soft-tissue]
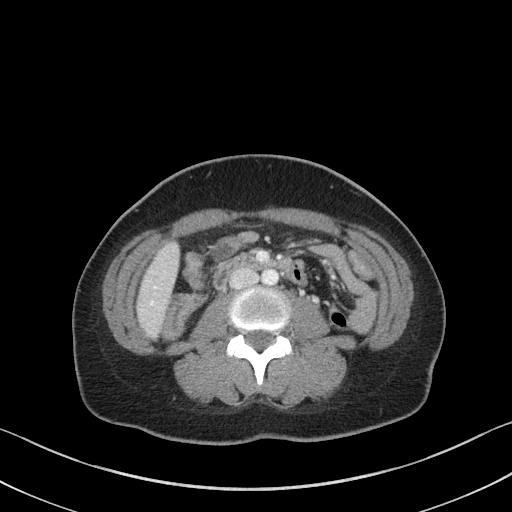
[im 62/93  soft-tissue]
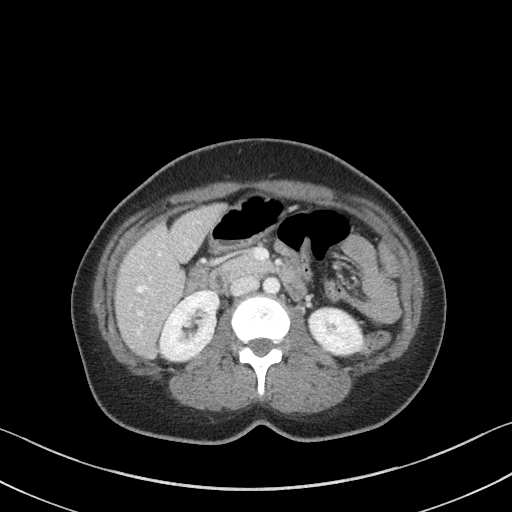
[im 62/93  bone]
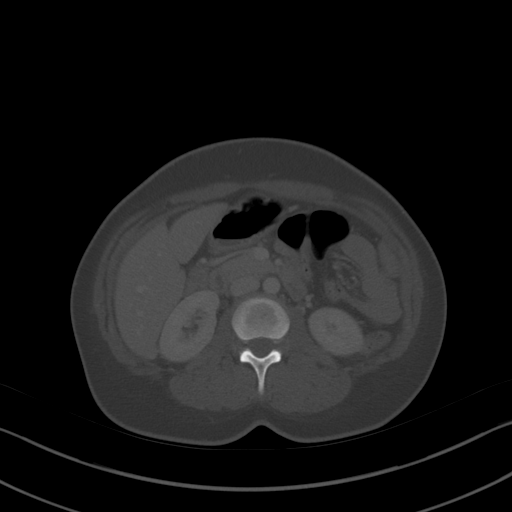
[im 69/93  soft-tissue]
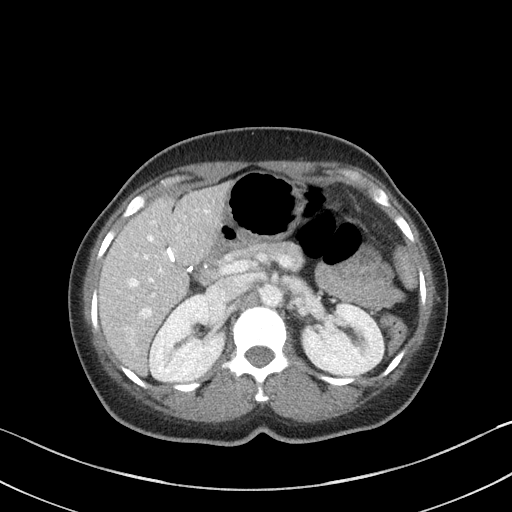
[im 75/93  soft-tissue]
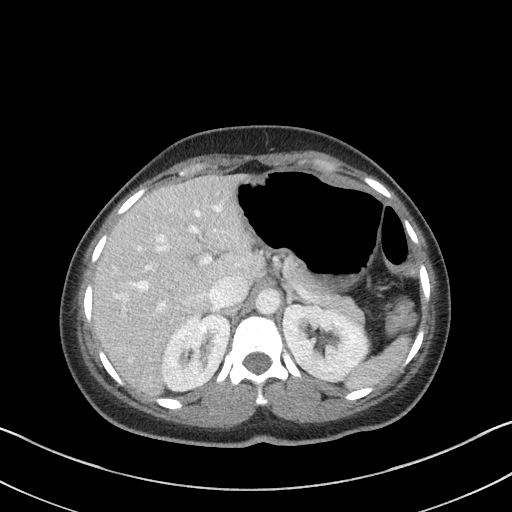
[im 82/93  soft-tissue]
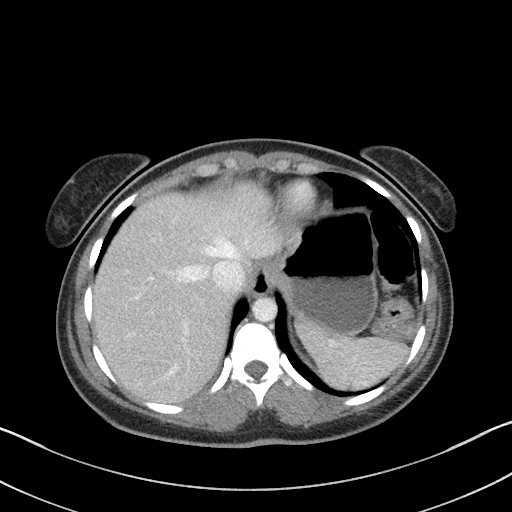
[im 89/93  soft-tissue]
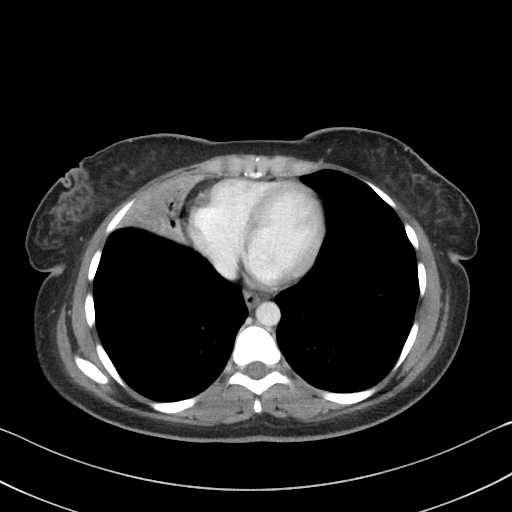

[Series 6: a/p w/ cor · coronal · 0.85mm/px · 3 of 181 slices shown]
[im 61/181  soft-tissue]
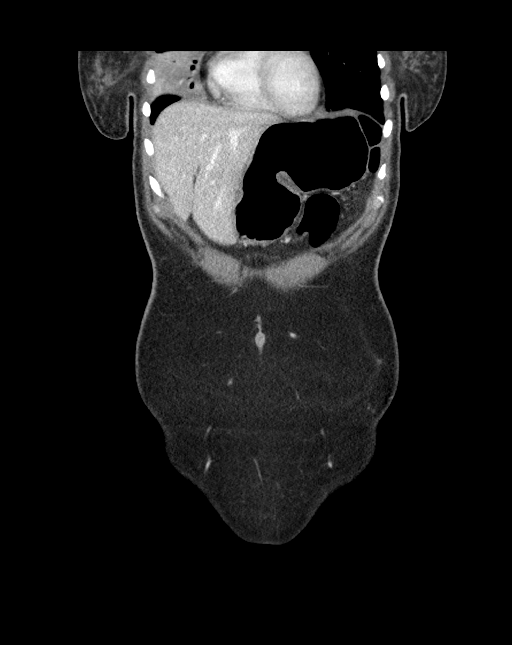
[im 81/181  soft-tissue]
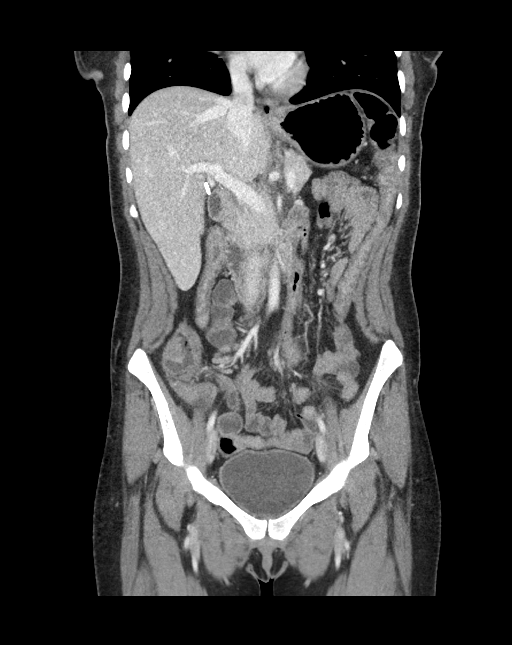
[im 101/181  soft-tissue]
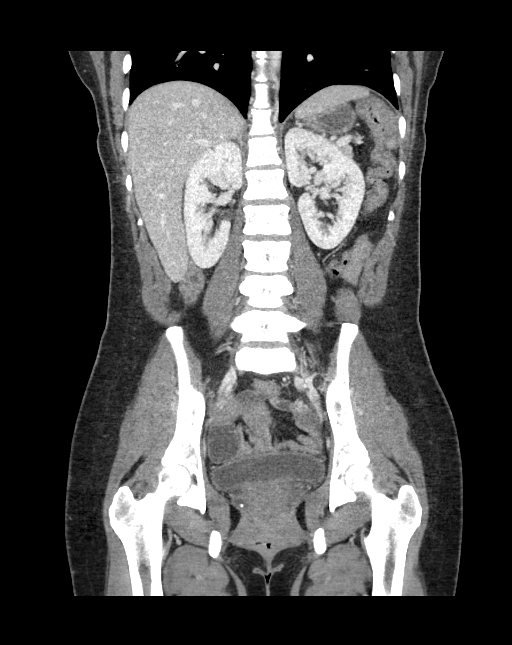

[16 of 46 positions shown; findings below may reference images not displayed]

FINDINGS: Lower chest: Right middle lobe opacity is noted most consistent with
pneumonia.

Hepatobiliary: No focal liver abnormality is seen. Status post
cholecystectomy. No biliary dilatation.

Pancreas: Unremarkable. No pancreatic ductal dilatation or
surrounding inflammatory changes.

Spleen: Normal in size without focal abnormality.

Adrenals/Urinary Tract: Adrenal glands are unremarkable. Kidneys are
normal, without renal calculi, focal lesion, or hydronephrosis.
Bladder is unremarkable.

Stomach/Bowel: Stomach is within normal limits. Appendix appears
normal. No evidence of bowel wall thickening, distention, or
inflammatory changes.

Vascular/Lymphatic: No significant vascular findings are present. No
enlarged abdominal or pelvic lymph nodes.

Reproductive: Uterus and bilateral adnexa are unremarkable.

Other: No abdominal wall hernia or abnormality. No abdominopelvic
ascites.

Musculoskeletal: No acute or significant osseous findings.
IMPRESSION: Right middle lobe pneumonia.

No definite abnormality seen in the abdomen or pelvis.

## 2019-06-29 ENCOUNTER — Emergency Department (HOSPITAL_COMMUNITY)
Admission: EM | Admit: 2019-06-29 | Discharge: 2019-06-30 | Disposition: A | Discharge status: Other Acute Inpatient Hospital | Payer: Medicaid Other | Attending: Emergency Medicine | Admitting: Emergency Medicine

## 2019-06-29 ENCOUNTER — Other Ambulatory Visit: Payer: Self-pay

## 2019-06-29 ENCOUNTER — Encounter (HOSPITAL_COMMUNITY): Payer: Self-pay | Admitting: Emergency Medicine

## 2019-06-29 DIAGNOSIS — D573 Sickle-cell trait: Secondary | ICD-10-CM | POA: Diagnosis not present

## 2019-06-29 DIAGNOSIS — T391X2A Poisoning by 4-Aminophenol derivatives, intentional self-harm, initial encounter: Secondary | ICD-10-CM | POA: Insufficient documentation

## 2019-06-29 DIAGNOSIS — Z20822 Contact with and (suspected) exposure to covid-19: Secondary | ICD-10-CM | POA: Diagnosis not present

## 2019-06-29 DIAGNOSIS — F332 Major depressive disorder, recurrent severe without psychotic features: Secondary | ICD-10-CM | POA: Diagnosis not present

## 2019-06-29 DIAGNOSIS — I10 Essential (primary) hypertension: Secondary | ICD-10-CM | POA: Diagnosis not present

## 2019-06-29 DIAGNOSIS — F329 Major depressive disorder, single episode, unspecified: Secondary | ICD-10-CM

## 2019-06-29 DIAGNOSIS — Z9049 Acquired absence of other specified parts of digestive tract: Secondary | ICD-10-CM | POA: Diagnosis not present

## 2019-06-29 DIAGNOSIS — T1491XA Suicide attempt, initial encounter: Secondary | ICD-10-CM

## 2019-06-29 DIAGNOSIS — F32A Depression, unspecified: Secondary | ICD-10-CM

## 2019-06-29 LAB — CBC WITH DIFFERENTIAL/PLATELET
Abs Immature Granulocytes: 0.01 10*3/uL (ref 0.00–0.07)
Basophils Absolute: 0 10*3/uL (ref 0.0–0.1)
Basophils Relative: 1 %
Eosinophils Absolute: 0.1 10*3/uL (ref 0.0–0.5)
Eosinophils Relative: 1 %
HCT: 36.9 % (ref 36.0–46.0)
Hemoglobin: 12.7 g/dL (ref 12.0–15.0)
Immature Granulocytes: 0 %
Lymphocytes Relative: 43 %
Lymphs Abs: 3 10*3/uL (ref 0.7–4.0)
MCH: 31.7 pg (ref 26.0–34.0)
MCHC: 34.4 g/dL (ref 30.0–36.0)
MCV: 92 fL (ref 80.0–100.0)
Monocytes Absolute: 0.7 10*3/uL (ref 0.1–1.0)
Monocytes Relative: 10 %
Neutro Abs: 3.2 10*3/uL (ref 1.7–7.7)
Neutrophils Relative %: 45 %
Platelets: 345 10*3/uL (ref 150–400)
RBC: 4.01 MIL/uL (ref 3.87–5.11)
RDW: 12.1 % (ref 11.5–15.5)
WBC: 7.1 10*3/uL (ref 4.0–10.5)
nRBC: 0 % (ref 0.0–0.2)

## 2019-06-29 LAB — RAPID URINE DRUG SCREEN, HOSP PERFORMED
Amphetamines: POSITIVE — AB
Barbiturates: NOT DETECTED
Benzodiazepines: NOT DETECTED
Cocaine: NOT DETECTED
Opiates: NOT DETECTED
Tetrahydrocannabinol: POSITIVE — AB

## 2019-06-29 LAB — COMPREHENSIVE METABOLIC PANEL
ALT: 10 U/L (ref 0–44)
AST: 17 U/L (ref 15–41)
Albumin: 4.4 g/dL (ref 3.5–5.0)
Alkaline Phosphatase: 42 U/L (ref 38–126)
Anion gap: 11 (ref 5–15)
BUN: 6 mg/dL (ref 6–20)
CO2: 24 mmol/L (ref 22–32)
Calcium: 9.1 mg/dL (ref 8.9–10.3)
Chloride: 105 mmol/L (ref 98–111)
Creatinine, Ser: 0.76 mg/dL (ref 0.44–1.00)
GFR calc Af Amer: >60 mL/min (ref >60)
GFR calc non Af Amer: >60 mL/min (ref >60)
Glucose, Bld: 81 mg/dL (ref 70–99)
Potassium: 3.6 mmol/L (ref 3.5–5.1)
Sodium: 140 mmol/L (ref 135–145)
Total Bilirubin: 0.7 mg/dL (ref 0.3–1.2)
Total Protein: 7.9 g/dL (ref 6.5–8.1)

## 2019-06-29 LAB — ETHANOL: Alcohol, Ethyl (B): <10 mg/dL (ref <10)

## 2019-06-29 LAB — ACETAMINOPHEN LEVEL: Acetaminophen (Tylenol), Serum: <10 ug/mL — ABNORMAL LOW (ref 10–30)

## 2019-06-29 LAB — SALICYLATE LEVEL: Salicylate Lvl: <7 mg/dL — ABNORMAL LOW (ref 7.0–30.0)

## 2019-06-29 MED ORDER — QUETIAPINE FUMARATE 25 MG PO TABS
25.0000 mg | ORAL_TABLET | Freq: Every day | ORAL | Status: DC
  Administered 2019-06-30: 25 mg via ORAL
  Filled 2019-06-29 (×2): qty 1

## 2019-06-29 MED ORDER — HYDROXYZINE HCL 25 MG PO TABS: 50.0000 mg | ORAL_TABLET | Freq: Three times a day (TID) | ORAL | Status: DC | PRN

## 2019-06-29 MED ORDER — QUETIAPINE FUMARATE 50 MG PO TABS
250.0000 mg | ORAL_TABLET | Freq: Every day | ORAL | Status: DC
  Administered 2019-06-29: 250 mg via ORAL
  Filled 2019-06-29 (×2): qty 2

## 2019-06-29 MED ORDER — METHIMAZOLE 10 MG PO TABS
20.0000 mg | ORAL_TABLET | Freq: Two times a day (BID) | ORAL | Status: DC
  Administered 2019-06-29 – 2019-06-30 (×2): 20 mg via ORAL
  Filled 2019-06-29 (×2): qty 2

## 2019-06-29 MED ORDER — METOCLOPRAMIDE HCL 10 MG PO TABS: 10.0000 mg | ORAL_TABLET | Freq: Four times a day (QID) | ORAL | Status: DC | PRN

## 2019-06-29 MED ORDER — BUPROPION HCL ER (XL) 150 MG PO TB24
150.0000 mg | ORAL_TABLET | Freq: Every day | ORAL | Status: DC
  Administered 2019-06-29 – 2019-06-30 (×2): 150 mg via ORAL
  Filled 2019-06-29 (×2): qty 1

## 2019-06-29 MED ORDER — CITALOPRAM HYDROBROMIDE 10 MG PO TABS
30.0000 mg | ORAL_TABLET | Freq: Every day | ORAL | Status: DC
  Administered 2019-06-29 – 2019-06-30 (×2): 30 mg via ORAL
  Filled 2019-06-29 (×2): qty 3

## 2019-06-29 MED ORDER — METOPROLOL TARTRATE 25 MG PO TABS
12.5000 mg | ORAL_TABLET | Freq: Two times a day (BID) | ORAL | Status: DC
  Administered 2019-06-29 – 2019-06-30 (×2): 12.5 mg via ORAL
  Filled 2019-06-29 (×2): qty 1

## 2019-06-29 NOTE — ED Notes (Signed)
Pt is alert x4, cooperative able to answers all my question. Appears to be very tearful with a flat affect. States  that she is under a lot of stress and would like to seek help.
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
## 2019-06-29 NOTE — ED Triage Notes (Signed)
Voluntary.  Flat affect, tearful, suicidal thoughts. This morning she took about 13 Tylenol PM in an attempt to end her life. Taking the pills did not cause her to sleep. She did not vomit.   Pt said that she has felt suicidal for a few weeks because "Too much is going on."  Lives with her boyfriend and has children.

## 2019-06-29 NOTE — ED Provider Notes (Signed)
[name] Provider Note   CSN: [id number] Arrival date & time: 06/29/19  1802     History Chief Complaint  Patient presents with  . Medical Clearance    Grace [name] is a 34 y.o. female.  HPI She presents for evaluation of suicidal attempt, with overdose of Tylenol PM at 6 AM this morning.  She states she took between 10 and 13, and attempt to kill herself.  She is distraught over "personal problems."  She will not specify exactly what.  She stated that she does not see a psychiatrist or therapist however she takes psychiatric medication.  She denies recent illnesses.  There are no other known modifying factors.  Past Medical History:  Diagnosis Date  . Cholecystitis 08/18/2011  . Headache(784.0)    otc med prn  . History of recurrent UTIs    recurrent UTIs  . Hypertension   . Hyperthyroidism   . Joint pain    knees and shoulders - otc med prn  . Sickle cell trait ([place])   . Sickle cell trait ([place])   . SVD (spontaneous vaginal delivery)    x 3 [place]    Patient Active Problem List   Diagnosis Date Noted  . Major depressive disorder, single episode, severe without psychotic features ([place]) 01/05/2016  . Severe episode of recurrent major depressive disorder, with psychotic features ([place])   . MDD (major depressive disorder), recurrent episode, severe ([place]) 04/15/2015  . Hyperthyroidism 03/21/2015  . Tachycardia 03/21/2015  . Palpitations 03/21/2015  . Chest pain 03/21/2015  . MDD (major depressive disorder), recurrent episode, moderate ([place]) 03/16/2015  . Consultation for female sterilization 05/09/2013  . NSVD (normal spontaneous vaginal delivery) 03/30/2013  . Chronic hypertension in pregnancy 03/29/2013  . HTN in pregnancy, chronic 03/28/2013  . Transient hypertension of pregnancy, antepartum 03/20/2013  . GERD (gastroesophageal reflux disease) 01/18/2013  . Hyperemesis gravidarum before end of [redacted] week gestation with electrolyte  imbalance 09/23/2012  . Trichomonas 09/23/2012  . Supervision of high-risk pregnancy 04/19/2011  . History of molar pregnancy, antepartum 03/22/2011  . Hyperthyroidism, antepartum 03/16/2011  . Sickle cell trait ([place]) 12/30/2010    Past Surgical History:  Procedure Laterality Date  . CHOLECYSTECTOMY  08/19/2011   Procedure: LAPAROSCOPIC CHOLECYSTECTOMY WITH INTRAOPERATIVE CHOLANGIOGRAM;  Surgeon: Madilyn Hook, DO;  Location: [facility];  Service: General;  Laterality: N/A;  . DILATION AND CURETTAGE OF UTERUS     molar preg  . LAPAROSCOPIC BILATERAL SALPINGECTOMY Bilateral 05/09/2013   Procedure: LAPAROSCOPIC BILATERAL SALPINGECTOMY;  Surgeon: Emily Filbert, MD;  Location: [facility];  Service: Gynecology;  Laterality: Bilateral;     OB History    Gravida  4   Para  3   Term  3   Preterm      AB  1   Living  3     SAB  1   TAB      Ectopic      Multiple      Live Births  3           Family History  Problem Relation Age of Onset  . Diabetes Mother   . Cancer Father   . Diabetes Maternal Uncle   . Cancer Maternal Grandmother   . Cancer Maternal Grandfather   . Hypertension Maternal Grandfather   . Diabetes Maternal Grandfather   . Sickle cell anemia Son   . Asthma Son   . Heart disease Paternal Grandfather   . Hearing loss Paternal Aunt  Social History   Tobacco Use  . Smoking status: Never Smoker  . Smokeless tobacco: Never Used  Substance Use Topics  . Alcohol use: No  . Drug use: No    Home Medications Prior to Admission medications   Medication Sig Start Date End Date Taking? Authorizing Provider  diphenhydramine-acetaminophen (TYLENOL PM) 25-500 MG TABS tablet Take 1 tablet by mouth at bedtime as needed (sleep).   Yes [provider]  buPROPion (WELLBUTRIN XL) 150 MG 24 hr tablet Take 1 tablet (150 mg total) by mouth daily. For depression Patient not taking: Reported on 06/29/2019 01/17/16   Armandina Stammer I, NP  citalopram (CELEXA) 10 MG  tablet Take 3 tablets (30 mg total) by mouth daily. For depression Patient not taking: Reported on 06/29/2019 01/17/16   Armandina Stammer I, NP  hydrOXYzine (ATARAX/VISTARIL) 50 MG tablet Take 1 tablet (50 mg total) by mouth every 8 (eight) hours as needed for anxiety. Patient not taking: Reported on 06/29/2019 01/16/16   Armandina Stammer I, NP  methimazole (TAPAZOLE) 10 MG tablet Take 2 tablets (20 mg total) by mouth 2 (two) times daily. For hyperthyroidim Patient not taking: Reported on 06/29/2019 01/16/16   Armandina Stammer I, NP  metoCLOPramide (REGLAN) 10 MG tablet Take 1 tablet (10 mg total) by mouth every 6 (six) hours as needed for nausea (or headache). Patient not taking: Reported on 06/29/2019 04/27/18   Dione Booze, MD  metoprolol tartrate (LOPRESSOR) 25 MG tablet Take 0.5 tablets (12.5 mg total) by mouth 2 (two) times daily. For high blood pressure Patient not taking: Reported on 06/29/2019 01/16/16   Armandina Stammer I, NP  promethazine (PHENERGAN) 12.5 MG tablet Take 1 tablet (12.5 mg total) by mouth 2 (two) times daily. Patient not taking: Reported on 06/29/2019 05/02/18   Graciella Freer A, PA-C  QUEtiapine (SEROQUEL) 25 MG tablet Take 1 tablet (25 mg total) by mouth daily. For agitation Patient not taking: Reported on 06/29/2019 01/17/16   Armandina Stammer I, NP  QUEtiapine (SEROQUEL) 50 MG tablet Take 5 tablets (250 mg total) by mouth at bedtime. For mood control Patient not taking: Reported on 06/29/2019 01/16/16   Armandina Stammer I, NP    Allergies    Patient has no known allergies.  Review of Systems   Review of Systems  All other systems reviewed and are negative.   Physical Exam Updated Vital Signs BP 118/86 (BP Location: Left Arm)   Pulse 89   Temp 99 F (37.2 C) (Oral)   Resp 16   Ht 5' 4.25" (1.632 m)   Wt 59 kg   LMP 06/01/2019 (Approximate)   SpO2 99%   BMI 22.14 kg/m   Physical Exam Vitals and nursing note reviewed.  Constitutional:      General: She is in acute distress (Crying).      Appearance: She is well-developed. She is not ill-appearing, toxic-appearing or diaphoretic.  HENT:     Head: Normocephalic and atraumatic.     Right Ear: External ear normal.     Left Ear: External ear normal.  Eyes:     Conjunctiva/sclera: Conjunctivae normal.     Pupils: Pupils are equal, round, and reactive to light.  Neck:     Trachea: Phonation normal.  Cardiovascular:     Rate and Rhythm: Normal rate.  Pulmonary:     Effort: Pulmonary effort is normal.  Abdominal:     General: There is no distension.  Musculoskeletal:        General: Normal range  of motion.     Cervical back: Normal range of motion and neck supple.  Skin:    General: Skin is warm and dry.  Neurological:     Mental Status: She is alert and oriented to person, place, and time.     Cranial Nerves: No cranial nerve deficit.     Sensory: No sensory deficit.     Motor: No abnormal muscle tone.     Coordination: Coordination normal.  Psychiatric:        Behavior: Behavior normal.        Thought Content: Thought content normal.        Judgment: Judgment normal.     Comments: Tearful     ED Results / Procedures / Treatments   Labs (all labs ordered are listed, but only abnormal results are displayed) Labs Reviewed  ACETAMINOPHEN LEVEL - Abnormal; Notable for the following components:      Result Value   Acetaminophen (Tylenol), Serum <10 (*)    All other components within normal limits  SALICYLATE LEVEL - Abnormal; Notable for the following components:   Salicylate Lvl <7.0 (*)    All other components within normal limits  RAPID URINE DRUG SCREEN, HOSP PERFORMED - Abnormal; Notable for the following components:   Amphetamines POSITIVE (*)    Tetrahydrocannabinol POSITIVE (*)    All other components within normal limits  COMPREHENSIVE METABOLIC PANEL  ETHANOL  CBC WITH DIFFERENTIAL/PLATELET  I-STAT CHEM 8, ED    EKG EKG Interpretation  Date/Time:  Friday June 29 2019 20:13:13  EST Ventricular Rate:  84 PR Interval:  138 QRS Duration: 74 QT Interval:  384 QTC Calculation: 453 R Axis:   53 Text Interpretation: Normal sinus rhythm Normal ECG Since last tracing rate slower Confirmed by Mancel Bale [phone]) on 06/29/2019 9:37:34 PM   Radiology No results found.  Procedures Procedures (including critical care time)  Medications Ordered in ED Medications  buPROPion (WELLBUTRIN XL) 24 hr tablet 150 mg (150 mg Oral Given 06/29/19 2103)  citalopram (CELEXA) tablet 30 mg (30 mg Oral Given 06/29/19 2102)  hydrOXYzine (ATARAX/VISTARIL) tablet 50 mg (has no administration in time range)  methimazole (TAPAZOLE) tablet 20 mg (20 mg Oral Given 06/29/19 2104)  metoCLOPramide (REGLAN) tablet 10 mg (has no administration in time range)  metoprolol tartrate (LOPRESSOR) tablet 12.5 mg (12.5 mg Oral Given 06/29/19 2103)  QUEtiapine (SEROQUEL) tablet 25 mg (25 mg Oral Not Given 06/29/19 2057)  QUEtiapine (SEROQUEL) tablet 250 mg (250 mg Oral Given 06/29/19 2103)    ED Course  I have reviewed the triage vital signs and the nursing notes.  Pertinent labs & imaging results that were available during my care of the patient were reviewed by me and considered in my medical decision making (see chart for details).  Clinical Course as of Jun 28 2253  Fri Jun 29, 2019  2254 Normal  Ethanol [EW]  2254 Normal  Acetaminophen level(!) [EW]  2254 Normal  Comprehensive metabolic panel [EW]  2254 Normal  Salicylate level(!) [EW]  2254 Normal except presence of amphetamines, THC.  Urine rapid drug screen (hosp performed)(!) [EW]  2254 Normal  CBC with Differential [EW]  2255 At this time the patient is medically cleared for treatment by psychiatry.   [EW]    Clinical Course User Index [EW] Mancel Bale, MD   MDM Rules/Calculators/A&P                       Patient Vitals for  the past 24 hrs:  BP Temp Temp src Pulse Resp SpO2 Height Weight  06/29/19 2024 118/86 99 F (37.2  C) Oral 89 16 99 % -- --  06/29/19 1900 -- -- -- -- -- -- 5' 4.25" (1.632 m) 59 kg  06/29/19 1822 114/86 99.3 F (37.4 C) Oral (!) 103 15 98 % -- --     Medical Decision Making: Suicide attempt, overdose on Tylenol PM.  No evidence for acetaminophen toxicity or liver injury.  No evidence for cardiac depression.  Medically clear and stable for psychiatric treatment.  She will likely require hospitalization and psychiatric facility.  Zionna KETZALY CARDELLA was evaluated in Emergency Department on 06/29/2019 for the symptoms described in the history of present illness. She was evaluated in the context of the global COVID-19 pandemic, which necessitated consideration that the patient might be at risk for infection with the SARS-CoV-2 virus that causes COVID-19. Institutional protocols and algorithms that pertain to the evaluation of patients at risk for COVID-19 are in a state of rapid change based on information released by regulatory bodies including the CDC and federal and state organizations. These policies and algorithms were followed during the patient's care in the ED.  CRITICAL CARE-no Performed by: Mancel Bale   Nursing Notes Reviewed/ Care Coordinated Applicable Imaging Reviewed Interpretation of Laboratory Data incorporated into ED treatment  TTS consultation   Final Clinical Impression(s) / ED Diagnoses Final diagnoses:  Suicide attempt [facility])  Depression, unspecified depression type    Rx / DC Orders ED Discharge Orders    None       Mancel Bale, MD 06/29/19 2257

## 2019-06-30 ENCOUNTER — Other Ambulatory Visit: Payer: Self-pay

## 2019-06-30 ENCOUNTER — Inpatient Hospital Stay (HOSPITAL_COMMUNITY)
Admission: AD | Admit: 2019-06-30 | Discharge: 2019-07-09 | DRG: 885 | Disposition: A | Discharge status: Home / Self Care | Payer: Medicaid Other | Source: Intra-hospital | Attending: Psychiatry | Admitting: Psychiatry

## 2019-06-30 ENCOUNTER — Encounter (HOSPITAL_COMMUNITY): Payer: Self-pay | Admitting: Nurse Practitioner

## 2019-06-30 DIAGNOSIS — Z9079 Acquired absence of other genital organ(s): Secondary | ICD-10-CM

## 2019-06-30 DIAGNOSIS — E0590 Thyrotoxicosis, unspecified without thyrotoxic crisis or storm: Secondary | ICD-10-CM | POA: Diagnosis present

## 2019-06-30 DIAGNOSIS — Z653 Problems related to other legal circumstances: Secondary | ICD-10-CM | POA: Diagnosis not present

## 2019-06-30 DIAGNOSIS — I10 Essential (primary) hypertension: Secondary | ICD-10-CM | POA: Diagnosis present

## 2019-06-30 DIAGNOSIS — F515 Nightmare disorder: Secondary | ICD-10-CM | POA: Diagnosis present

## 2019-06-30 DIAGNOSIS — F4310 Post-traumatic stress disorder, unspecified: Secondary | ICD-10-CM | POA: Diagnosis present

## 2019-06-30 DIAGNOSIS — Z9049 Acquired absence of other specified parts of digestive tract: Secondary | ICD-10-CM | POA: Diagnosis not present

## 2019-06-30 DIAGNOSIS — Z832 Family history of diseases of the blood and blood-forming organs and certain disorders involving the immune mechanism: Secondary | ICD-10-CM

## 2019-06-30 DIAGNOSIS — Z8249 Family history of ischemic heart disease and other diseases of the circulatory system: Secondary | ICD-10-CM

## 2019-06-30 DIAGNOSIS — F332 Major depressive disorder, recurrent severe without psychotic features: Principal | ICD-10-CM | POA: Diagnosis present

## 2019-06-30 DIAGNOSIS — G4700 Insomnia, unspecified: Secondary | ICD-10-CM | POA: Diagnosis present

## 2019-06-30 DIAGNOSIS — Z825 Family history of asthma and other chronic lower respiratory diseases: Secondary | ICD-10-CM

## 2019-06-30 DIAGNOSIS — Z8719 Personal history of other diseases of the digestive system: Secondary | ICD-10-CM

## 2019-06-30 DIAGNOSIS — F419 Anxiety disorder, unspecified: Secondary | ICD-10-CM | POA: Diagnosis present

## 2019-06-30 DIAGNOSIS — Z822 Family history of deafness and hearing loss: Secondary | ICD-10-CM

## 2019-06-30 DIAGNOSIS — Z915 Personal history of self-harm: Secondary | ICD-10-CM | POA: Diagnosis not present

## 2019-06-30 DIAGNOSIS — R252 Cramp and spasm: Secondary | ICD-10-CM | POA: Diagnosis not present

## 2019-06-30 DIAGNOSIS — T391X2A Poisoning by 4-Aminophenol derivatives, intentional self-harm, initial encounter: Secondary | ICD-10-CM | POA: Diagnosis not present

## 2019-06-30 DIAGNOSIS — Z23 Encounter for immunization: Secondary | ICD-10-CM

## 2019-06-30 DIAGNOSIS — R45851 Suicidal ideations: Secondary | ICD-10-CM | POA: Diagnosis present

## 2019-06-30 DIAGNOSIS — F333 Major depressive disorder, recurrent, severe with psychotic symptoms: Secondary | ICD-10-CM | POA: Diagnosis present

## 2019-06-30 DIAGNOSIS — F1510 Other stimulant abuse, uncomplicated: Secondary | ICD-10-CM | POA: Diagnosis present

## 2019-06-30 DIAGNOSIS — Z79899 Other long term (current) drug therapy: Secondary | ICD-10-CM

## 2019-06-30 DIAGNOSIS — Z833 Family history of diabetes mellitus: Secondary | ICD-10-CM

## 2019-06-30 DIAGNOSIS — Z818 Family history of other mental and behavioral disorders: Secondary | ICD-10-CM | POA: Diagnosis not present

## 2019-06-30 DIAGNOSIS — Z87440 Personal history of urinary (tract) infections: Secondary | ICD-10-CM | POA: Diagnosis not present

## 2019-06-30 DIAGNOSIS — D571 Sickle-cell disease without crisis: Secondary | ICD-10-CM | POA: Diagnosis present

## 2019-06-30 DIAGNOSIS — F1220 Cannabis dependence, uncomplicated: Secondary | ICD-10-CM | POA: Diagnosis present

## 2019-06-30 DIAGNOSIS — K219 Gastro-esophageal reflux disease without esophagitis: Secondary | ICD-10-CM | POA: Diagnosis present

## 2019-06-30 LAB — RESPIRATORY PANEL BY RT PCR (FLU A&B, COVID)
Influenza A by PCR: NEGATIVE
Influenza B by PCR: NEGATIVE
SARS Coronavirus 2 by RT PCR: NEGATIVE

## 2019-06-30 MED ORDER — METOPROLOL TARTRATE 25 MG PO TABS
12.5000 mg | ORAL_TABLET | Freq: Two times a day (BID) | ORAL | Status: DC
  Administered 2019-06-30 – 2019-07-08 (×16): 12.5 mg via ORAL
  Filled 2019-06-30 (×18): qty 0.5
  Filled 2019-06-30: qty 1
  Filled 2019-06-30 (×2): qty 0.5

## 2019-06-30 MED ORDER — PNEUMOCOCCAL VAC POLYVALENT 25 MCG/0.5ML IJ INJ
0.5000 mL | INJECTION | INTRAMUSCULAR | Status: AC
  Administered 2019-07-03: 0.5 mL via INTRAMUSCULAR

## 2019-06-30 MED ORDER — ACETAMINOPHEN 325 MG PO TABS
650.0000 mg | ORAL_TABLET | Freq: Four times a day (QID) | ORAL | Status: DC | PRN
  Administered 2019-07-08 (×2): 650 mg via ORAL
  Filled 2019-06-30 (×2): qty 2

## 2019-06-30 MED ORDER — METHIMAZOLE 10 MG PO TABS
20.0000 mg | ORAL_TABLET | Freq: Two times a day (BID) | ORAL | Status: DC
  Administered 2019-06-30 – 2019-07-09 (×18): 20 mg via ORAL
  Filled 2019-06-30 (×22): qty 2

## 2019-06-30 MED ORDER — ALUM & MAG HYDROXIDE-SIMETH 200-200-20 MG/5ML PO SUSP: 30.0000 mL | ORAL | Status: DC | PRN

## 2019-06-30 MED ORDER — MAGNESIUM HYDROXIDE 400 MG/5ML PO SUSP: 30.0000 mL | Freq: Every day | ORAL | Status: DC | PRN

## 2019-06-30 MED ORDER — INFLUENZA VAC SPLIT QUAD 0.5 ML IM SUSY
0.5000 mL | PREFILLED_SYRINGE | INTRAMUSCULAR | Status: AC
  Administered 2019-07-01: 0.5 mL via INTRAMUSCULAR
  Filled 2019-06-30: qty 0.5

## 2019-06-30 MED ORDER — QUETIAPINE FUMARATE 25 MG PO TABS
25.0000 mg | ORAL_TABLET | Freq: Every day | ORAL | Status: DC
  Filled 2019-06-30 (×2): qty 1

## 2019-06-30 MED ORDER — METOCLOPRAMIDE HCL 10 MG PO TABS: 10.0000 mg | ORAL_TABLET | Freq: Four times a day (QID) | ORAL | Status: DC | PRN

## 2019-06-30 MED ORDER — QUETIAPINE FUMARATE 50 MG PO TABS
250.0000 mg | ORAL_TABLET | Freq: Every day | ORAL | Status: DC
  Filled 2019-06-30: qty 1

## 2019-06-30 MED ORDER — QUETIAPINE FUMARATE 200 MG PO TABS
200.0000 mg | ORAL_TABLET | Freq: Every day | ORAL | Status: DC
  Administered 2019-06-30 – 2019-07-01 (×2): 200 mg via ORAL
  Filled 2019-06-30 (×5): qty 1

## 2019-06-30 MED ORDER — HYDROXYZINE HCL 50 MG PO TABS
50.0000 mg | ORAL_TABLET | Freq: Three times a day (TID) | ORAL | Status: DC | PRN
  Administered 2019-06-30 – 2019-07-07 (×14): 50 mg via ORAL
  Filled 2019-06-30 (×15): qty 1

## 2019-06-30 MED ORDER — CITALOPRAM HYDROBROMIDE 20 MG PO TABS
20.0000 mg | ORAL_TABLET | Freq: Every day | ORAL | Status: DC
  Administered 2019-06-30 – 2019-07-02 (×3): 20 mg via ORAL
  Filled 2019-06-30 (×6): qty 1
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
## 2019-06-30 NOTE — Progress Notes (Signed)
Psychoeducational Group Note  Date:  06/30/2019 Time: 2030  Group Topic/Focus:  wrap up group  Participation Level: Did Not Attend  Participation Quality:  Not Applicable  Affect:  Not Applicable  Cognitive:  Not Applicable  Insight:  Not Applicable  Engagement in Group: Not Applicable  Additional Comments: Pt was asleep during group time.  Marcille Buffy 06/30/2019, 9:05 PM

## 2019-06-30 NOTE — Tx Team (Signed)
Initial Treatment Plan 06/30/2019 5:18 PM Charniece MARYSSA GIAMPIETRO WBL:[id number]    PATIENT STRESSORS: Marital or family conflict   PATIENT STRENGTHS: Ability for insight Barrister's clerk for treatment/growth Supportive family/friends   PATIENT IDENTIFIED PROBLEMS: "Anger"  "Anxiety"  "Irritability"  "Depression"               DISCHARGE CRITERIA:  Improved stabilization in mood, thinking, and/or behavior Verbal commitment to aftercare and medication compliance  PRELIMINARY DISCHARGE PLAN: Outpatient therapy Return to previous living arrangement  PATIENT/FAMILY INVOLVEMENT: This treatment plan has been presented to and reviewed with the patient, BENJAMIN MERRIHEW, and/or family member  The patient and family have been given the opportunity to ask questions and make suggestions.  Tania Ade, RN 06/30/2019, 5:18 PM

## 2019-06-30 NOTE — Progress Notes (Signed)
   06/30/19 2339  Psych Admission Type (Psych Patients Only)  Admission Status Voluntary  Psychosocial Assessment  Patient Complaints Anxiety  Eye Contact Fair  Facial Expression Flat  Affect Appropriate to circumstance  Speech Logical/coherent  Interaction Assertive  Motor Activity Other (Comment) (WDL)  Appearance/Hygiene In scrubs  Behavior Characteristics Appropriate to situation  Mood Depressed  Thought Process  Coherency WDL  Content WDL  Delusions None reported or observed  Perception WDL  Hallucination None reported or observed  Judgment Impaired  Confusion None  Danger to Self  Current suicidal ideation? Denies  Danger to Others  Danger to Others None reported or observed
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
## 2019-06-30 NOTE — Progress Notes (Signed)
Patient ID: Grace [name], female   DOB: March 31, 1986, 34 y.o.   MRN: [id number]   [place] NOVEL CORONAVIRUS (COVID-19) DAILY CHECK-OFF SYMPTOMS - answer yes or no to each - every day NO YES  Have you had a fever in the past 24 hours?  . Fever (Temp > 37.80C / 100F) X   Have you had any of these symptoms in the past 24 hours? . New Cough .  Sore Throat  .  Shortness of Breath .  Difficulty Breathing .  Unexplained Body Aches   X   Have you had any one of these symptoms in the past 24 hours not related to allergies?   . Runny Nose .  Nasal Congestion .  Sneezing   X   If you have had runny nose, nasal congestion, sneezing in the past 24 hours, has it worsened?  X   EXPOSURES - check yes or no X   Have you traveled outside the state in the past 14 days?  X   Have you been in contact with someone with a confirmed diagnosis of COVID-19 or PUI in the past 14 days without wearing appropriate PPE?  X   Have you been living in the same home as a person with confirmed diagnosis of COVID-19 or a PUI (household contact)?    X   Have you been diagnosed with COVID-19?    X              What to do next: Answered NO to all: Answered YES to anything:   Proceed with unit schedule Follow the [facility] Inpatient Flowsheet.

## 2019-06-30 NOTE — Progress Notes (Signed)
Pt accepted to [facility] to the service of MD Cobos.  Room number 304-01.  Bed available after 0800 hours 06/30/19.  Report can be called to [phone] when transportation is arranged in am

## 2019-06-30 NOTE — Progress Notes (Signed)
   06/30/19 2333  COVID-19 Daily Checkoff  Have you had a fever (temp > 37.80C/100F)  in the past 24 hours?  No  If you have had runny nose, nasal congestion, sneezing in the past 24 hours, has it worsened? No  COVID-19 EXPOSURE  Have you traveled outside the state in the past 14 days? No  Have you been in contact with someone with a confirmed diagnosis of COVID-19 or PUI in the past 14 days without wearing appropriate PPE? No  Have you been living in the same home as a person with confirmed diagnosis of COVID-19 or a PUI (household contact)? No  Have you been diagnosed with COVID-19? No

## 2019-06-30 NOTE — ED Notes (Signed)
Pt off unit to [facility] per provider. Pt alert, calm, cooperative, no s/s of distress. Pt voluntary. Information given to Tree surgeon for [facility]. Belongings given to Tree surgeon for [facility]. Pt ambulatory off unit, escorted NT. Pt transported by [employer]

## 2019-07-01 LAB — HEMOGLOBIN A1C
Hgb A1c MFr Bld: 5 % (ref 4.8–5.6)
Mean Plasma Glucose: 96.8 mg/dL

## 2019-07-01 LAB — LIPID PANEL
Cholesterol: 133 mg/dL (ref 0–200)
HDL: 31 mg/dL — ABNORMAL LOW (ref >40)
LDL Cholesterol: 88 mg/dL (ref 0–99)
Total CHOL/HDL Ratio: 4.3 RATIO
Triglycerides: 70 mg/dL (ref <150)
VLDL: 14 mg/dL (ref 0–40)

## 2019-07-01 LAB — TSH: TSH: 0.197 u[IU]/mL — ABNORMAL LOW (ref 0.350–4.500)

## 2019-07-01 MED ORDER — PRAZOSIN HCL 1 MG PO CAPS
1.0000 mg | ORAL_CAPSULE | Freq: Every day | ORAL | Status: DC
  Administered 2019-07-01 – 2019-07-07 (×6): 1 mg via ORAL
  Filled 2019-07-01 (×11): qty 1

## 2019-07-01 NOTE — Progress Notes (Signed)
D. Pt presents as depressed and somewhat guarded-remained in bed for much of the shift- pt expresses passive SI without plan- agrees to contact staff before acting on any harmful thoughts.  A. Labs and vitals monitored. Pt given and educated on medications. Pt supported emotionally and encouraged to express concerns and ask questions.   R. Pt remains safe with 15 minute checks. Will continue POC.
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
## 2019-07-01 NOTE — Progress Notes (Signed)
Patient participated in music therapy group by sharing favorite song lyrics and watching live Grammy performances. Patients discussed how music can be calming and motivating.  
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
## 2019-07-01 NOTE — [facility] Group Notes (Signed)
[facility] LCSW Group Therapy Note  07/01/2019    Type of Therapy and Topic:  Group Therapy:  Adding Supports Including Yourself  Participation Level:  Active   Description of Group:   Patients in this group were introduced to the concept that additional supports including self-support are an essential part of recovery.  Patients listed what supports they believe they need to add to their lives to achieve their goals at discharge, and they listed such things as therapist, family, doctor, support groups, 12-step groups and service animals.   A song entitled "My Own Hero" was played and a group discussion ensued in which patients stated they could relate to the song and it inspired them to realize they have be willing to help themselves in order to succeed, because other people cannot achieve sobriety or stability for them.  "Fight For It" was played, then "I Am Enough" to encourage patients.  They discussed the impact on them and how they must remain convinced that their lives are worth the effort it takes to become sober and/or stable.  Therapeutic Goals: 1)  demonstrate the importance of being a key part of one's own support system 2)  discuss various available supports 3)  encourage patient to use music as part of their self-support and focus on goals 4)  elicit ideas from patients about supports that need to be added   Summary of Patient Progress:  The patient expressed that her need currently is for emotional support from her friends and the relationship she is currently in as well as her family.  She stated she needs outpatient care to be in place, because she needs to learn new and more coping skills.  She was quiet for the remainder of group but listened attentively.   Therapeutic Modalities:   Motivational Interviewing Activity  Grace [name]  8:53 AM

## 2019-07-02 MED ORDER — CYCLOBENZAPRINE HCL 10 MG PO TABS
5.0000 mg | ORAL_TABLET | Freq: Three times a day (TID) | ORAL | Status: DC | PRN
  Administered 2019-07-02 – 2019-07-08 (×4): 5 mg via ORAL
  Filled 2019-07-02 (×5): qty 1

## 2019-07-02 MED ORDER — CITALOPRAM HYDROBROMIDE 10 MG PO TABS
30.0000 mg | ORAL_TABLET | Freq: Every day | ORAL | Status: DC
  Administered 2019-07-03 – 2019-07-04 (×2): 30 mg via ORAL
  Filled 2019-07-02 (×4): qty 3

## 2019-07-02 MED ORDER — CITALOPRAM HYDROBROMIDE 10 MG PO TABS
10.0000 mg | ORAL_TABLET | ORAL | Status: AC
  Administered 2019-07-02: 10 mg via ORAL
  Filled 2019-07-02: qty 1

## 2019-07-02 MED ORDER — QUETIAPINE FUMARATE 300 MG PO TABS
300.0000 mg | ORAL_TABLET | Freq: Every day | ORAL | Status: DC
  Administered 2019-07-02 – 2019-07-08 (×7): 300 mg via ORAL
  Filled 2019-07-02 (×8): qty 1

## 2019-07-02 MED ORDER — PANTOPRAZOLE SODIUM 40 MG PO TBEC
40.0000 mg | DELAYED_RELEASE_TABLET | Freq: Every day | ORAL | Status: DC
  Administered 2019-07-02 – 2019-07-09 (×8): 40 mg via ORAL
  Filled 2019-07-02 (×10): qty 1

## 2019-07-02 NOTE — [facility] Suicide Risk Assessment (Signed)
[facility] INPATIENT:  Family/Significant Other Suicide Prevention Education  Suicide Prevention Education:  Contact Attempts: Alford Highland [phone], mother and boyfriend, Clemens Catholic, [phone], (name of family member/significant other) has been identified by the patient as the family member/significant other with whom the patient will be residing, and identified as the person(s) who will aid the patient in the event of a mental health crisis.  With written consent from the patient, two attempts were made to provide suicide prevention education, prior to and/or following the patient's discharge.  We were unsuccessful in providing suicide prevention education.  A suicide education pamphlet was given to the patient to share with family/significant other.  Date and time of first attempt: 07/02/2019 3:00PM Date and time of second attempt: Second attempts are needed.   CSW notes that she called both collaterals, the mother's number rang in incessantly without the option of leaving a voicemail and HIPAA compliant voicemail was left with the pt's identified boyfriend.    Harden Mo 07/02/2019, 3:00 PM

## 2019-07-02 NOTE — Progress Notes (Signed)
   07/02/19 0053  COVID-19 Daily Checkoff  Have you had a fever (temp > 37.80C/100F)  in the past 24 hours?  No  If you have had runny nose, nasal congestion, sneezing in the past 24 hours, has it worsened? No  COVID-19 EXPOSURE  Have you traveled outside the state in the past 14 days? No  Have you been in contact with someone with a confirmed diagnosis of COVID-19 or PUI in the past 14 days without wearing appropriate PPE? No  Have you been living in the same home as a person with confirmed diagnosis of COVID-19 or a PUI (household contact)? No  Have you been diagnosed with COVID-19? No

## 2019-07-02 NOTE — [facility] Suicide Risk Assessment (Deleted)
[facility] INPATIENT:  Family/Significant Other Suicide Prevention Education  Suicide Prevention Education:  Contact Attempts:  Candee Furbish, grandmother, [phone] has been identified by the patient as the family member/significant other with whom the patient will be residing, and identified as the person(s) who will aid the patient in the event of a mental health crisis.  With written consent from the patient, two attempts were made to provide suicide prevention education, prior to and/or following the patient's discharge.  We were unsuccessful in providing suicide prevention education.  A suicide education pamphlet was given to the patient to share with family/significant other.  Date and time of first attempt: 07/02/2019 2:53pm Date and time of second attempt: Second attempt is needed  CSW left HIPAA compliant voicemail.  Harden Mo 07/02/2019, 2:53 PM

## 2019-07-02 NOTE — Progress Notes (Signed)
D:  Patient denied SI and HI, contracts for safety.  Denied A/V hallucinations.  Denied pain. A:  Medications administered per MD orders.  Emotional support and encouragement given patient. R:  Safety maintained with 15 minute checks.  

## 2019-07-02 NOTE — Plan of Care (Signed)
Nurse discussed anxiety, depression and coping skills with patient.  

## 2019-07-02 NOTE — Progress Notes (Signed)
[facility] MD Progress Note  07/02/2019 11:49 AM Grace [name]  MRN:  [id number] Subjective:  Patient is a 34 year old female who presented to the [facility] emergency department on 06/29/2019 after an intentional overdose of 10-13 Tylenol PM tablets.  She stated she wanted to go to sleep and not wake up.  Objective: Patient is seen and examined.  Patient is a 34 year old female with a past psychiatric history significant for major depression and posttraumatic stress disorder.  She is seen in follow-up.  She stated that her depression is slightly better, but still having some suicidal thoughts.  She stated her sleep was a little bit better, but she had some neck spasms last night that woke her up.  She stated the addition of the prazosin has decreased her nightmares and flashbacks.  I asked her about the Reglan, and she stated she was taking that for "sour stomach".  I asked her if we could stop that and substitute something else.  She stated she remains anxious and wanted know if we could increase the Celexa.  I told her we would push that up to 30 mg today.  Her EKG on admission had a normal QTc interval, and she has one that is already been done this morning and I will review that when available.  I asked her about her methimazole, and she stated that she had been compliant with it beforehand.  Her TSH is only approximately 0.2.  We also discussed possibly increasing her Seroquel.  She had been previously on 250 mg nightly, and I thought we might go on and push it to 300.  Her vital signs are stable, she is afebrile.  She slept 5.75 hours last night.  Principal Problem: Severe recurrent major depression without psychotic features (HCC) Diagnosis: Principal Problem:   Severe recurrent major depression without psychotic features (HCC)  Total Time spent with patient: 20 minutes  Past Psychiatric History: See admission H&P  Past Medical History:  Past Medical History:  Diagnosis Date   . Cholecystitis 08/18/2011  . Headache(784.0)    otc med prn  . History of recurrent UTIs    recurrent UTIs  . Hypertension   . Hyperthyroidism   . Joint pain    knees and shoulders - otc med prn  . Sickle cell trait (HCC)   . Sickle cell trait (HCC)   . SVD (spontaneous vaginal delivery)    x 3 [facility]    Past Surgical History:  Procedure Laterality Date  . CHOLECYSTECTOMY  08/19/2011   Procedure: LAPAROSCOPIC CHOLECYSTECTOMY WITH INTRAOPERATIVE CHOLANGIOGRAM;  Surgeon: Lodema Pilot, DO;  Location: [facility] OR;  Service: General;  Laterality: N/A;  . DILATION AND CURETTAGE OF UTERUS     molar preg  . LAPAROSCOPIC BILATERAL SALPINGECTOMY Bilateral 05/09/2013   Procedure: LAPAROSCOPIC BILATERAL SALPINGECTOMY;  Surgeon: Allie Bossier, MD;  Location: [facility] ORS;  Service: Gynecology;  Laterality: Bilateral;   Family History:  Family History  Problem Relation Age of Onset  . Diabetes Mother   . Cancer Father   . Diabetes Maternal Uncle   . Cancer Maternal Grandmother   . Cancer Maternal Grandfather   . Hypertension Maternal Grandfather   . Diabetes Maternal Grandfather   . Sickle cell anemia Son   . Asthma Son   . Heart disease Paternal Grandfather   . Hearing loss Paternal Aunt    Family Psychiatric  History: See admission H&P Social History:  Social History   Substance and Sexual Activity  Alcohol Use  No     Social History   Substance and Sexual Activity  Drug Use No    Social History   Socioeconomic History  . Marital status: Single    Spouse name: Not on file  . Number of children: Not on file  . Years of education: Not on file  . Highest education level: Not on file  Occupational History  . Not on file  Tobacco Use  . Smoking status: Never Smoker  . Smokeless tobacco: Never Used  Substance and Sexual Activity  . Alcohol use: No  . Drug use: No  . Sexual activity: Yes    Birth control/protection: Surgical  Other Topics Concern  . Not on file  Social History  Narrative   2 Adults, 4 children at home   FOB involved   Social Determinants of Health   Financial Resource Strain:   . Difficulty of Paying Living Expenses:   Food Insecurity:   . Worried About Programme researcher, broadcasting/film/videounning Out of Food in the Last Year:   . Baristaan Out of Food in the Last Year:   Transportation Needs:   . Freight forwarderLack of Transportation (Medical):   Marland Kitchen. Lack of Transportation (Non-Medical):   Physical Activity:   . Days of Exercise per Week:   . Minutes of Exercise per Session:   Stress:   . Feeling of Stress :   Social Connections:   . Frequency of Communication with Friends and Family:   . Frequency of Social Gatherings with Friends and Family:   . Attends Religious Services:   . Active Member of Clubs or Organizations:   . Attends BankerClub or Organization Meetings:   Marland Kitchen. Marital Status:    Additional Social History:                         Sleep: Fair  Appetite:  Fair  Current Medications: Current Facility-Administered Medications  Medication Dose Route Frequency Provider Last Rate Last Admin  . acetaminophen (TYLENOL) tablet 650 mg  650 mg Oral Q6H PRN Nira ConnBerry, Jason A, NP      . alum & mag hydroxide-simeth (MAALOX/MYLANTA) 200-200-20 MG/5ML suspension 30 mL  30 mL Oral Q4H PRN Nira ConnBerry, Jason A, NP      . citalopram (CELEXA) tablet 10 mg  10 mg Oral NOW Antonieta Pertlary, Danea Manter Lawson, MD      . Melene Muller[START ON 07/03/2019] citalopram (CELEXA) tablet 30 mg  30 mg Oral Daily Antonieta Pertlary, Rosalita Carey Lawson, MD      . cyclobenzaprine (FLEXERIL) tablet 5 mg  5 mg Oral TID PRN Antonieta Pertlary, Tama Grosz Lawson, MD      . hydrOXYzine (ATARAX/VISTARIL) tablet 50 mg  50 mg Oral Q8H PRN Nira ConnBerry, Jason A, NP   50 mg at 07/02/19 0920  . magnesium hydroxide (MILK OF MAGNESIA) suspension 30 mL  30 mL Oral Daily PRN Nira ConnBerry, Jason A, NP      . methimazole (TAPAZOLE) tablet 20 mg  20 mg Oral BID Nira ConnBerry, Jason A, NP   20 mg at 07/02/19 0900  . metoprolol tartrate (LOPRESSOR) tablet 12.5 mg  12.5 mg Oral BID Nira ConnBerry, Jason A, NP   12.5 mg at 07/02/19 0900   . pantoprazole (PROTONIX) EC tablet 40 mg  40 mg Oral Daily Antonieta Pertlary, Birdella Sippel Lawson, MD      . pneumococcal 23 valent vaccine (PNEUMOVAX-23) injection 0.5 mL  0.5 mL Intramuscular Tomorrow-1000 Antonieta Pertlary, Jesus Poplin Lawson, MD      . prazosin (MINIPRESS) capsule 1 mg  1 mg Oral  QHS Armandina Stammer I, NP   1 mg at 07/01/19 2143  . QUEtiapine (SEROQUEL) tablet 300 mg  300 mg Oral QHS Antonieta Pert, MD        Lab Results:  Results for orders placed or performed during the hospital encounter of 06/30/19 (from the past 48 hour(s))  Hemoglobin A1c     Status: None   Collection Time: 07/01/19  6:32 AM  Result Value Ref Range   Hgb A1c MFr Bld 5.0 4.8 - 5.6 %    Comment: (NOTE) Pre diabetes:          5.7%-6.4% Diabetes:              >6.4% Glycemic control for   <7.0% adults with diabetes    Mean Plasma Glucose 96.8 mg/dL    Comment: Performed at [facility], [address] [place], [place] [address]  Lipid panel     Status: Abnormal   Collection Time: 07/01/19  6:32 AM  Result Value Ref Range   Cholesterol 133 0 - 200 mg/dL   Triglycerides 70 <502 mg/dL   HDL 31 (L) >77 mg/dL   Total CHOL/HDL Ratio 4.3 RATIO   VLDL 14 0 - 40 mg/dL   LDL Cholesterol 88 0 - 99 mg/dL    Comment:        Total Cholesterol/HDL:CHD Risk Coronary Heart Disease Risk Table                     Men   Women  1/2 Average Risk   3.4   3.3  Average Risk       5.0   4.4  2 X Average Risk   9.6   7.1  3 X Average Risk  23.4   11.0        Use the calculated Patient Ratio above and the CHD Risk Table to determine the patient's CHD Risk.        ATP III CLASSIFICATION (LDL):  <100     mg/dL   Optimal  412-878  mg/dL   Near or Above                    Optimal  130-159  mg/dL   Borderline  676-720  mg/dL   High  >947     mg/dL   Very High Performed at [facility], [address] [place], [place] [address]   TSH     Status: Abnormal   Collection Time: 07/01/19  6:32 AM  Result Value Ref Range    TSH 0.197 (L) 0.350 - 4.500 uIU/mL    Comment: Performed by a 3rd Generation assay with a functional sensitivity of <=0.01 uIU/mL. Performed at [facility], [address], [place], [place] [address]     Blood Alcohol level:  Lab Results  Component Value Date   ETH <10 06/29/2019   ETH <5 01/05/2016    Metabolic Disorder Labs: Lab Results  Component Value Date   HGBA1C 5.0 07/01/2019   MPG 96.8 07/01/2019   MPG 94 01/09/2016   Lab Results  Component Value Date   PROLACTIN 35.2 (H) 01/09/2016   Lab Results  Component Value Date   CHOL 133 07/01/2019   TRIG 70 07/01/2019   HDL 31 (L) 07/01/2019   CHOLHDL 4.3 07/01/2019   VLDL 14 07/01/2019   LDLCALC 88 07/01/2019   LDLCALC 68 01/09/2016    Physical Findings: AIMS: Facial and Oral  Movements Muscles of Facial Expression: None, normal Lips and Perioral Area: None, normal Jaw: None, normal Tongue: None, normal,Extremity Movements Upper (arms, wrists, hands, fingers): None, normal Lower (legs, knees, ankles, toes): None, normal, Trunk Movements Neck, shoulders, hips: None, normal, Overall Severity Severity of abnormal movements (highest score from questions above): None, normal Incapacitation due to abnormal movements: None, normal Patient's awareness of abnormal movements (rate only patient's report): No Awareness, Dental Status Current problems with teeth and/or dentures?: No Does patient usually wear dentures?: No  CIWA:  CIWA-Ar Total: 0 COWS:     Musculoskeletal: Strength & Muscle Tone: within normal limits Gait & Station: normal Patient leans: N/A  Psychiatric Specialty Exam: Physical Exam  Nursing note and vitals reviewed. Constitutional: She is oriented to person, place, and time. She appears well-developed and well-nourished.  HENT:  Head: Normocephalic and atraumatic.  Respiratory: Effort normal.  Neurological: She is alert and oriented to person, place, and time.    Review of  Systems  Blood pressure 102/75, pulse 99, temperature 98.1 F (36.7 C), temperature source Oral, resp. rate 16, height 5' 4.25" (1.632 m), weight 61.2 kg, SpO2 100 %.Body mass index is 22.99 kg/m.  General Appearance: Casual  Eye Contact:  Fair  Speech:  Normal Rate  Volume:  Normal  Mood:  Anxious and Depressed  Affect:  Congruent  Thought Process:  Coherent and Descriptions of Associations: Intact  Orientation:  Full (Time, Place, and Person)  Thought Content:  Logical  Suicidal Thoughts:  Yes.  without intent/plan  Homicidal Thoughts:  No  Memory:  Immediate;   Fair Recent;   Fair Remote;   Fair  Judgement:  Intact  Insight:  Fair  Psychomotor Activity:  Increased  Concentration:  Concentration: Fair and Attention Span: Fair  Recall:  [employer] of Knowledge:  Good  Language:  Good  Akathisia:  Negative  Handed:  Right  AIMS (if indicated):     Assets:  Desire for Improvement Resilience  ADL's:  Intact  Cognition:  WNL  Sleep:  Number of Hours: 5.75     Treatment Plan Summary: Daily contact with patient to assess and evaluate symptoms and progress in treatment, Medication management and Plan : Patient is seen and examined.  Patient is a 34 year old female with the above-stated past psychiatric history who is seen in follow-up.   Diagnosis: #1 major depression, recurrent, severe without psychotic features, #2 posttraumatic stress disorder, #3 hypothyroidism, #4 GERD, #5 sickle trait, #6 cannabis dependence, #7 stimulant/ecstasy abuse  Patient is seen in follow-up.  She is only slightly better than she was on admission.  We will go on and increase her citalopram up to 30 mg a day.  She had an EKG done this morning, and we will monitor that for any changes in her QTc interval.  She still having difficulty sleeping, so I will increase her Seroquel to 300 mg p.o. nightly.  I will also add Flexeril 5 mg p.o. 3 times daily for neck spasms in case that is what is keeping her  awake.  She tolerated the prazosin last night and stated that the nightmares and flashbacks were not present last night.  No change in that.  She stated she felt as though they had given her Flagyl for "sour stomach".  I am going to stop that given the neuropsychiatric side effects, and place her on Protonix 40 mg p.o. daily.  She has a history of hyperthyroidism, and her TSH is approximately 0.2.  We will continue  the methimazole as written.  No other changes in her medications at this time.  1.  Increase citalopram to 30 mg p.o. daily for depression and anxiety. 2.  Add Flexeril 5 mg p.o. 3 times daily as needed neck spasms. 3.  Continue hydroxyzine 50 mg p.o. every 8 hours as needed anxiety. 4.  Continue methimazole 20 mg p.o. twice daily for hyperthyroidism. 5.  Continue metoprolol 12.5 mg p.o. twice daily for hypertension and anxiety. 6.  Stop Flagyl. 7.  Start Protonix 40 mg p.o. daily for reflux disease. 8.  Continue prazosin 1 mg p.o. nightly for nightmares and flashbacks. 9.  Increase Seroquel to 300 mg p.o. nightly for insomnia, psychotic features, augmentation of antidepressant medication. 10.  Disposition planning-in progress.  Antonieta Pert, MD 07/02/2019, 11:49 AM

## 2019-07-02 NOTE — Progress Notes (Signed)
Recreation Therapy Notes  Date:  3.15.21 Time: 0930 Location: [address] Group Room  Group Topic: Stress Management  Goal Area(s) Addresses:  Patient will identify positive stress management techniques. Patient will identify benefits of using stress management post d/c.  Intervention: Stress Management  Activity : Meditation.  LRT played a meditation that focused on choice.  Patients were to listen and follow along as meditation played to engage in the meditation.  Education:  Stress Management, Discharge Planning.   Education Outcome: Acknowledges Education  Clinical Observations/Feedback:  Pt did not attend group activity.    Vianca Bracher, LRT/CTRS        Jobani Sabado A 07/02/2019 12:02 PM 

## 2019-07-02 NOTE — Tx Team (Addendum)
Interdisciplinary Treatment and Diagnostic Plan Update  07/02/2019 Time of Session: 9:00AM Grace [name] MRN: [id number]  Principal Diagnosis: Severe recurrent major depression without psychotic features [facility])  Secondary Diagnoses: Principal Problem:   Severe recurrent major depression without psychotic features ([place])   Current Medications:  Current Facility-Administered Medications  Medication Dose Route Frequency Provider Last Rate Last Admin  . acetaminophen (TYLENOL) tablet 650 mg  650 mg Oral Q6H PRN Lindon Romp A, NP      . alum & mag hydroxide-simeth (MAALOX/MYLANTA) 200-200-20 MG/5ML suspension 30 mL  30 mL Oral Q4H PRN Lindon Romp A, NP      . citalopram (CELEXA) tablet 20 mg  20 mg Oral Daily Sharma Covert, MD   20 mg at 07/02/19 0900  . hydrOXYzine (ATARAX/VISTARIL) tablet 50 mg  50 mg Oral Q8H PRN Lindon Romp A, NP   50 mg at 07/02/19 0920  . magnesium hydroxide (MILK OF MAGNESIA) suspension 30 mL  30 mL Oral Daily PRN Lindon Romp A, NP      . methimazole (TAPAZOLE) tablet 20 mg  20 mg Oral BID Lindon Romp A, NP   20 mg at 07/02/19 0900  . metoCLOPramide (REGLAN) tablet 10 mg  10 mg Oral Q6H PRN Lindon Romp A, NP      . metoprolol tartrate (LOPRESSOR) tablet 12.5 mg  12.5 mg Oral BID Lindon Romp A, NP   12.5 mg at 07/02/19 0900  . pneumococcal 23 valent vaccine (PNEUMOVAX-23) injection 0.5 mL  0.5 mL Intramuscular Tomorrow-1000 Sharma Covert, MD      . prazosin (MINIPRESS) capsule 1 mg  1 mg Oral QHS Lindell Spar I, NP   1 mg at 07/01/19 2143  . QUEtiapine (SEROQUEL) tablet 200 mg  200 mg Oral QHS Sharma Covert, MD   200 mg at 07/01/19 2143   PTA Medications: Medications Prior to Admission  Medication Sig Dispense Refill Last Dose  . buPROPion (WELLBUTRIN XL) 150 MG 24 hr tablet Take 1 tablet (150 mg total) by mouth daily. For depression (Patient not taking: Reported on 06/29/2019) 30 tablet 0   . citalopram (CELEXA) 10 MG tablet Take 3 tablets (30  mg total) by mouth daily. For depression (Patient not taking: Reported on 06/29/2019) 90 tablet 0   . diphenhydramine-acetaminophen (TYLENOL PM) 25-500 MG TABS tablet Take 1 tablet by mouth at bedtime as needed (sleep).     . hydrOXYzine (ATARAX/VISTARIL) 50 MG tablet Take 1 tablet (50 mg total) by mouth every 8 (eight) hours as needed for anxiety. (Patient not taking: Reported on 06/29/2019) 60 tablet 0   . methimazole (TAPAZOLE) 10 MG tablet Take 2 tablets (20 mg total) by mouth 2 (two) times daily. For hyperthyroidim (Patient not taking: Reported on 06/29/2019) 1 tablet 0   . metoCLOPramide (REGLAN) 10 MG tablet Take 1 tablet (10 mg total) by mouth every 6 (six) hours as needed for nausea (or headache). (Patient not taking: Reported on 06/29/2019) 30 tablet 0   . metoprolol tartrate (LOPRESSOR) 25 MG tablet Take 0.5 tablets (12.5 mg total) by mouth 2 (two) times daily. For high blood pressure (Patient not taking: Reported on 06/29/2019) 14 tablet 0   . promethazine (PHENERGAN) 12.5 MG tablet Take 1 tablet (12.5 mg total) by mouth 2 (two) times daily. (Patient not taking: Reported on 06/29/2019) 6 tablet 0   . QUEtiapine (SEROQUEL) 25 MG tablet Take 1 tablet (25 mg total) by mouth daily. For agitation (Patient not taking: Reported on 06/29/2019) 30  tablet 0   . QUEtiapine (SEROQUEL) 50 MG tablet Take 5 tablets (250 mg total) by mouth at bedtime. 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   Physician Treatment Plan for Primary Diagnosis: Severe recurrent major depression without psychotic features (HCC) Long Term Goal(s): Improvement in symptoms so as ready  for discharge Improvement in symptoms so as ready for discharge   Short Term Goals: Ability to identify changes in lifestyle to reduce recurrence of condition will improve Ability to verbalize feelings will improve Ability to disclose and discuss suicidal ideas Ability to demonstrate self-control will improve Ability to identify and develop effective coping behaviors will improve Ability to maintain clinical measurements within normal limits will improve Compliance with prescribed medications will improve Ability to identify triggers associated with substance abuse/mental health issues will improve Ability to identify changes in lifestyle to reduce recurrence of condition will improve Ability to verbalize feelings will improve Ability to disclose and discuss suicidal ideas Ability to demonstrate self-control will improve Ability to identify and develop effective coping behaviors will improve Ability to maintain clinical measurements within normal limits will improve Compliance with prescribed medications will improve Ability to identify triggers associated with substance abuse/mental health issues will improve  Medication Management: Evaluate patient's response, side effects, and tolerance of medication regimen.  Therapeutic Interventions: 1 to 1 sessions, Unit Group sessions and Medication administration.  Evaluation of Outcomes: Progressing  Physician Treatment Plan for Secondary Diagnosis: Principal Problem:   Severe recurrent major depression without psychotic features (HCC)  Long Term Goal(s): Improvement in symptoms so as ready for discharge Improvement in symptoms so as ready for discharge   Short Term Goals: Ability to identify changes in lifestyle to reduce recurrence of condition will improve Ability to verbalize feelings will improve Ability to disclose and discuss suicidal ideas Ability to demonstrate self-control will improve Ability to identify and develop effective  coping behaviors will improve Ability to maintain clinical measurements within normal limits will improve Compliance with prescribed medications will improve Ability to identify triggers associated with substance abuse/mental health issues will improve Ability to identify changes in lifestyle to reduce recurrence of condition will improve Ability to verbalize feelings will improve Ability to disclose and discuss suicidal ideas Ability to demonstrate self-control will improve Ability to identify and develop effective coping behaviors will improve Ability to maintain clinical measurements within normal limits will improve Compliance with prescribed medications will improve Ability to identify triggers associated with substance abuse/mental health issues will improve     Medication Management: Evaluate patient's response, side effects, and tolerance of medication regimen.  Therapeutic Interventions: 1 to 1 sessions, Unit Group sessions and Medication administration.  Evaluation of Outcomes: Progressing   RN Treatment Plan for Primary Diagnosis: Severe recurrent major depression without psychotic features (HCC) Long Term Goal(s): Knowledge of disease and therapeutic regimen to maintain health will improve  Short Term Goals: Ability to demonstrate self-control, Ability to participate in decision making will improve, Ability to verbalize feelings will improve, Ability to disclose and discuss suicidal ideas, Ability to identify and develop effective coping behaviors will improve and Compliance with prescribed medications will improve  Medication Management: RN will administer medications as ordered by provider, will assess and evaluate patient's response and provide  education to patient for prescribed medication. RN will report any adverse and/or side effects to prescribing provider.  Therapeutic Interventions: 1 on 1 counseling sessions, Psychoeducation, Medication administration, Evaluate  responses to treatment, Monitor vital signs and CBGs as ordered, Perform/monitor CIWA, COWS, AIMS and Fall Risk screenings as ordered, Perform wound care treatments as ordered.  Evaluation of Outcomes: Progressing   LCSW Treatment Plan for Primary Diagnosis: Severe recurrent major depression without psychotic features (HCC) Long Term Goal(s): Safe transition to appropriate next level of care at discharge, Engage patient in therapeutic group addressing interpersonal concerns.  Short Term Goals: Engage patient in aftercare planning with referrals and resources, Increase social support, Increase ability to appropriately verbalize feelings, Increase emotional regulation, Facilitate acceptance of mental health diagnosis and concerns and Increase skills for wellness and recovery  Therapeutic Interventions: Assess for all discharge needs, 1 to 1 time with Social worker, Explore available resources and support systems, Assess for adequacy in community support network, Educate family and significant other(s) on suicide prevention, Complete Psychosocial Assessment, Interpersonal group therapy.  Evaluation of Outcomes: Progressing   Progress in Treatment: Attending groups: Yes. Participating in groups: Yes. Taking medication as prescribed: Yes. Toleration medication: Yes. Family/Significant other contact made: No, will contact:  once permission is given. Patient understands diagnosis: Yes. Discussing patient identified problems/goals with staff: Yes. Medical problems stabilized or resolved: Yes. Denies suicidal/homicidal ideation: No. Issues/concerns per patient self-inventory: No. Other: none  New problem(s) identified: No, Describe:  none  New Short Term/Long Term Goal(s): medication management for mood stabilization; elimination of SI thoughts; development of comprehensive mental wellness plan.  Patient Goals:  "be free of the suicidal thoughts and doing new coping skills"  Discharge Plan  or Barriers: Patient reports plans to continue with current provider. CSW will assist in further aftercare plans.  Reason for Continuation of Hospitalization: Anxiety Depression Medication stabilization Suicidal ideation  Estimated Length of Stay:  1-7 days  Attendees: Patient:  Grace [name] 07/02/2019 9:50 AM  Physician: Dr. Jola Babinski, MD 07/02/2019 9:50 AM  Nursing:  07/02/2019 9:50 AM  RN Care Manager: 07/02/2019 9:50 AM  Social Worker: Penni Homans, LCSW 07/02/2019 9:50 AM  Recreational Therapist:  07/02/2019 9:50 AM  Other:  07/02/2019 9:50 AM  Other:  07/02/2019 9:50 AM  Other: 07/02/2019 9:50 AM    Scribe for Treatment Team: Harden Mo, LCSW 07/02/2019 9:50 AM

## 2019-07-02 NOTE — Progress Notes (Signed)
   07/02/19 0055  Psych Admission Type (Psych Patients Only)  Admission Status Voluntary  Psychosocial Assessment  Patient Complaints Anxiety  Eye Contact Fair  Facial Expression Flat  Affect Appropriate to circumstance  Speech Logical/coherent  Interaction Assertive  Motor Activity Other (Comment) (WDL)  Appearance/Hygiene Unremarkable  Behavior Characteristics Appropriate to situation  Mood Anxious;Pleasant  Thought Process  Coherency WDL  Content WDL  Delusions None reported or observed  Perception WDL  Hallucination None reported or observed  Judgment Impaired  Confusion None  Danger to Self  Current suicidal ideation? Denies  Danger to Others  Danger to Others None reported or observed

## 2019-07-02 NOTE — Progress Notes (Signed)
The patient verbalized that she had an "okay" day and that she enjoyed listening to music in the music therapy group. Her goal for tomorrow is to have a better day and to have fewer suicidal thoughts.

## 2019-07-02 NOTE — [facility] Group Notes (Signed)
LCSW Group Therapy Note 07/02/2019 1:15 PM  Type of Therapy and Topic: Group Therapy: Overcoming Obstacles  Participation Level: Did Not Attend  Description of Group:  In this group patients will be encouraged to explore what they see as obstacles to their own wellness and recovery. They will be guided to discuss their thoughts, feelings, and behaviors related to these obstacles. The group will process together ways to cope with barriers, with attention given to specific choices patients can make. Each patient will be challenged to identify changes they are motivated to make in order to overcome their obstacles. This group will be process-oriented, with patients participating in exploration of their own experiences as well as giving and receiving support and challenge from other group members.  Therapeutic Goals: 1. Patient will identify personal and current obstacles as they relate to admission. 2. Patient will identify barriers that currently interfere with their wellness or overcoming obstacles.  3. Patient will identify feelings, thought process and behaviors related to these barriers. 4. Patient will identify two changes they are willing to make to overcome these obstacles:   Summary of Patient Progress  Invited, chose not to attend.     Therapeutic Modalities:  Cognitive Behavioral Therapy Solution Focused Therapy Motivational Interviewing Relapse Prevention Therapy   Alcario Drought Clinical Social Worker

## 2019-07-02 NOTE — Progress Notes (Signed)
ADULT GRIEF GROUP NOTE:  Spiritual care group on grief and loss facilitated by chaplain Burnis Kingfisher  Group Goal:  Support / Education around grief and loss Members engage in facilitated group support and psycho-social education.  Group Description:  Following introductions and group rules, group members engaged in facilitated group dialog and support around topic of loss, with particular support around experiences of loss in their lives. Group Identified types of loss (relationships / self / things) and identified patterns, circumstances, and changes that precipitate losses. Reflected on thoughts / feelings around loss, normalized grief responses, and recognized variety in grief experience. Patient Progress:  Present throughout group.  Alert and observant.  Did not participate in group discussion.

## 2019-07-03 NOTE — Progress Notes (Signed)
   07/03/19 2200  Psych Admission Type (Psych Patients Only)  Admission Status Voluntary  Psychosocial Assessment  Patient Complaints Anxiety  Eye Contact Fair  Facial Expression Flat  Affect Appropriate to circumstance  Speech Logical/coherent  Interaction Assertive  Motor Activity Other (Comment) (WDL)  Appearance/Hygiene Unremarkable  Behavior Characteristics Cooperative  Mood Depressed  Thought Process  Coherency WDL  Content WDL  Delusions None reported or observed  Perception WDL  Hallucination None reported or observed  Judgment Impaired  Confusion None  Danger to Self  Current suicidal ideation? Passive  Self-Injurious Behavior Some self-injurious ideation observed or expressed.  No lethal plan expressed   Agreement Not to Harm Self Yes  Description of Agreement verbal contract for safety  Danger to Others  Danger to Others None reported or observed

## 2019-07-03 NOTE — Progress Notes (Signed)
Recreation Therapy Notes  Animal-Assisted Activity (AAA) Program Checklist/Progress Notes Patient Eligibility Criteria Checklist & Daily Group note for Rec Tx Intervention  Date: 3.16.21 Time: 1430 Location: [address] Dayroom   AAA/T Program Assumption of Risk Form signed by Patient/ or Parent Legal Guardian YES  Patient is free of allergies or sever asthma YES  Patient reports no fear of animals YES   Patient reports no history of cruelty to animals YES  Patient understands his/her participation is voluntary YES   Patient washes hands before animal contact YES   Patient washes hands after animal contact YES  Behavioral Response: Engaged  Education: Hand Washing, Appropriate Animal Interaction   Education Outcome: Acknowledges understanding/In group clarification offered/Needs additional education.   Clinical Observations/Feedback: Pt attended and participated in activity.    Akyra Bouchie, LRT/CTRS         Hester Joslin A 07/03/2019 3:34 PM 

## 2019-07-03 NOTE — Progress Notes (Signed)
[facility] MD Progress Note  07/03/2019 11:12 AM [place]  MRN:  [id number] Subjective: Patient reports some improvement compared to admission.  She describes partially improved mood but endorses lingering depression and endorses intermittent passive SI with thoughts of "going to sleep and not waking up".  She denies any active self-injurious or suicidal ideations and contracts for safety at this time.  She denies medication side effects.    Objective: I have reviewed chart, discussed case with treatment team, met with patient.   34 year old female, presented to ED on 3/12 following an overdose on Tylenol PM.  Of note, admission acetaminophen  serum level less than 10.  Reported at the time she wanted to "go to sleep and not wake up".  She reported significant psychosocial stressors: CPS took custody of her children.  She states that in order to regain custody she needs to take a series of classes and demonstrate she has a place of her own (currently lives with boyfriend).  She has a history of prior psychiatric admissions for depression.  Reported use of "ecstasy" prior to admission but denies regular use or pattern of dependence.  Today patient describes some improvement compared to how she felt on admission but describes lingering depression and as above endorses intermittent passive SI, although denies any actual suicidal plan or intention and contracts for safety on unit.  She states "I am feeling a little better".  She remains ruminative regarding her stressors as above. Currently denies medication side effects.  She states she is feeling optimistic her medications are going to work for her. No disruptive or agitated behaviors on unit. Limited milieu/group attendance at this time. Labs reviewed-mean plasma glucose 96.8.  Lipid panel unremarkable except for slightly low HDL, hemoglobin A1c 5.0.  TSH 0.197. Vitals have been stable.  Today pulse 88, BP 96/69, temperature 98  Principal Problem:  Severe recurrent major depression without psychotic features (HCC) Diagnosis: Principal Problem:   Severe recurrent major depression without psychotic features ([place])  Total Time spent with patient: 15 minutes  Past Psychiatric History: See admission H&P  Past Medical History:  Past Medical History:  Diagnosis Date  . Cholecystitis 08/18/2011  . Headache(784.0)    otc med prn  . History of recurrent UTIs    recurrent UTIs  . Hypertension   . Hyperthyroidism   . Joint pain    knees and shoulders - otc med prn  . Sickle cell trait ([place])   . Sickle cell trait ([place])   . SVD (spontaneous vaginal delivery)    x 3 WH    Past Surgical History:  Procedure Laterality Date  . CHOLECYSTECTOMY  08/19/2011   Procedure: LAPAROSCOPIC CHOLECYSTECTOMY WITH INTRAOPERATIVE CHOLANGIOGRAM;  Surgeon: Madilyn Hook, DO;  Location: [facility];  Service: General;  Laterality: N/A;  . DILATION AND CURETTAGE OF UTERUS     molar preg  . LAPAROSCOPIC BILATERAL SALPINGECTOMY Bilateral 05/09/2013   Procedure: LAPAROSCOPIC BILATERAL SALPINGECTOMY;  Surgeon: Emily Filbert, MD;  Location: [facility];  Service: Gynecology;  Laterality: Bilateral;   Family History:  Family History  Problem Relation Age of Onset  . Diabetes Mother   . Cancer Father   . Diabetes Maternal Uncle   . Cancer Maternal Grandmother   . Cancer Maternal Grandfather   . Hypertension Maternal Grandfather   . Diabetes Maternal Grandfather   . Sickle cell anemia Son   . Asthma Son   . Heart disease Paternal Grandfather   . Hearing loss Paternal Aunt  Family Psychiatric  History: See admission H&P Social History:  Social History   Substance and Sexual Activity  Alcohol Use No     Social History   Substance and Sexual Activity  Drug Use No    Social History   Socioeconomic History  . Marital status: Single    Spouse name: Not on file  . Number of children: Not on file  . Years of education: Not on file  . Highest education level: Not  on file  Occupational History  . Not on file  Tobacco Use  . Smoking status: Never Smoker  . Smokeless tobacco: Never Used  Substance and Sexual Activity  . Alcohol use: No  . Drug use: No  . Sexual activity: Yes    Birth control/protection: Surgical  Other Topics Concern  . Not on file  Social History Narrative   2 Adults, 4 children at home   FOB involved   Social Determinants of Health   Financial Resource Strain:   . Difficulty of Paying Living Expenses:   Food Insecurity:   . Worried About Charity fundraiser in the Last Year:   . Arboriculturist in the Last Year:   Transportation Needs:   . Film/video editor (Medical):   Marland Kitchen Lack of Transportation (Non-Medical):   Physical Activity:   . Days of Exercise per Week:   . Minutes of Exercise per Session:   Stress:   . Feeling of Stress :   Social Connections:   . Frequency of Communication with Friends and Family:   . Frequency of Social Gatherings with Friends and Family:   . Attends Religious Services:   . Active Member of Clubs or Organizations:   . Attends Archivist Meetings:   Marland Kitchen Marital Status:    Additional Social History:   Sleep: Improving  Appetite:  Fair/improving  Current Medications: Current Facility-Administered Medications  Medication Dose Route Frequency Provider Last Rate Last Admin  . acetaminophen (TYLENOL) tablet 650 mg  650 mg Oral Q6H PRN Lindon Romp A, NP      . alum & mag hydroxide-simeth (MAALOX/MYLANTA) 200-200-20 MG/5ML suspension 30 mL  30 mL Oral Q4H PRN Lindon Romp A, NP      . citalopram (CELEXA) tablet 30 mg  30 mg Oral Daily Sharma Covert, MD   30 mg at 07/03/19 0931  . cyclobenzaprine (FLEXERIL) tablet 5 mg  5 mg Oral TID PRN Sharma Covert, MD   5 mg at 07/02/19 1731  . hydrOXYzine (ATARAX/VISTARIL) tablet 50 mg  50 mg Oral Q8H PRN Lindon Romp A, NP   50 mg at 07/03/19 0934  . magnesium hydroxide (MILK OF MAGNESIA) suspension 30 mL  30 mL Oral Daily PRN  Lindon Romp A, NP      . methimazole (TAPAZOLE) tablet 20 mg  20 mg Oral BID Lindon Romp A, NP   20 mg at 07/03/19 0931  . metoprolol tartrate (LOPRESSOR) tablet 12.5 mg  12.5 mg Oral BID Lindon Romp A, NP   12.5 mg at 07/03/19 0931  . pantoprazole (PROTONIX) EC tablet 40 mg  40 mg Oral Daily Sharma Covert, MD   40 mg at 07/03/19 0931  . pneumococcal 23 valent vaccine (PNEUMOVAX-23) injection 0.5 mL  0.5 mL Intramuscular Tomorrow-1000 Sharma Covert, MD      . prazosin (MINIPRESS) capsule 1 mg  1 mg Oral QHS Lindell Spar I, NP   1 mg at 07/02/19 2126  . QUEtiapine (  SEROQUEL) tablet 300 mg  300 mg Oral QHS Sharma Covert, MD   300 mg at 07/02/19 2126    Lab Results:  No results found for this or any previous visit (from the past 27 hour(s)).  Blood Alcohol level:  Lab Results  Component Value Date   ETH <10 06/29/2019   ETH <5 62/37/6283    Metabolic Disorder Labs: Lab Results  Component Value Date   HGBA1C 5.0 07/01/2019   MPG 96.8 07/01/2019   MPG 94 01/09/2016   Lab Results  Component Value Date   PROLACTIN 35.2 (H) 01/09/2016   Lab Results  Component Value Date   CHOL 133 07/01/2019   TRIG 70 07/01/2019   HDL 31 (L) 07/01/2019   CHOLHDL 4.3 07/01/2019   VLDL 14 07/01/2019   LDLCALC 88 07/01/2019   LDLCALC 68 01/09/2016    Physical Findings: AIMS: Facial and Oral Movements Muscles of Facial Expression: None, normal Lips and Perioral Area: None, normal Jaw: None, normal Tongue: None, normal,Extremity Movements Upper (arms, wrists, hands, fingers): None, normal Lower (legs, knees, ankles, toes): None, normal, Trunk Movements Neck, shoulders, hips: None, normal, Overall Severity Severity of abnormal movements (highest score from questions above): None, normal Incapacitation due to abnormal movements: None, normal Patient's awareness of abnormal movements (rate only patient's report): No Awareness, Dental Status Current problems with teeth and/or  dentures?: No Does patient usually wear dentures?: No  CIWA:  CIWA-Ar Total: 0 COWS:     Musculoskeletal: Strength & Muscle Tone: within normal limits Gait & Station: normal Patient leans: N/A  Psychiatric Specialty Exam: Physical Exam  Nursing note and vitals reviewed. Constitutional: She is oriented to person, place, and time. She appears well-developed and well-nourished.  HENT:  Head: Normocephalic and atraumatic.  Respiratory: Effort normal.  Neurological: She is alert and oriented to person, place, and time.    Review of Systems no nausea no vomiting.  Blood pressure 96/69, pulse 88, temperature 98 F (36.7 C), temperature source Oral, resp. rate 16, height 5' 4.25" (1.632 m), weight 61.2 kg, SpO2 100 %.Body mass index is 22.99 kg/m.  General Appearance: Casual  Eye Contact:  Fair, improved partially during session  Speech:  Normal Rate  Volume:  Normal  Mood:  Reports feeling better than she did on admission, remains depressed and vaguely constricted in affect  Affect:  Constricted-does smile briefly at times  Thought Process:  Linear and Descriptions of Associations: Intact  Orientation:  Full (Time, Place, and Person)  Thought Content:  No hallucinations, no delusions  Suicidal Thoughts:  Yes.  without intent/plan-endorses intermittent thoughts of "not waking up" denies any actual suicidal plan or intention, contracts for safety on unit  Homicidal Thoughts:  No  Memory:  Recent and remote grossly intact  Judgement:  Other:  Fair/improving  Insight:  Fair  Psychomotor Activity:  Decreased  Concentration:  Concentration: Good and Attention Span: Good  Recall:  Good  Fund of Knowledge:  Good  Language:  Good  Akathisia:  Negative  Handed:  Right  AIMS (if indicated):     Assets:  Desire for Improvement Resilience  ADL's:  Intact  Cognition:  WNL  Sleep:  Number of Hours: 6.75   Assessment 34 year old female, presented to ED on 3/12 following an overdose on  Tylenol PM.  Of note, admission acetaminophen  serum level less than 10.  Reported at the time she wanted to "go to sleep and not wake up".  She reported significant psychosocial stressors: CPS took  custody of her children.  She states that in order to regain custody she needs to take a series of classes and demonstrate she has a place of her own (currently lives with boyfriend).  She has a history of prior psychiatric admissions for depression.  Reported use of "ecstasy" prior to admission but denies regular use or pattern of dependence.  At this time patient reports some improvement compared to how she felt on admission.  She does remain vaguely constricted in affect and describes persistent passive thoughts of not waking up/death although denies any suicidal plan or intention and contracts for safety.  She is tolerating medications well thus far.  Denies side effects.  Treatment Plan Summary: Daily contact with patient to assess and evaluate symptoms and progress in treatment, Medication management and Plan : Patient is seen and examined.  Patient is a 34 year old female with the above-stated past psychiatric history who is seen in follow-up.  Treatment plan reviewed as below today 3/16 Encourage group and milieu participation. Continue Celexa 30 mg daily for depression Continue Seroquel 300 mg nightly for mood disorder/insomnia Continue Minipress 1 mg nightly for nightmares Continue Lopressor 12.5 mg twice daily for hypertension Continue Tapazole 20 mg twice daily for history of hyperthyroidism Treatment team working on disposition planning options Jenne Campus, MD 07/03/2019, 11:12 AM   Patient ID: Lora Paula, female   DOB: 19-Mar-1986, 34 y.o.   MRN: [id number]

## 2019-07-03 NOTE — Progress Notes (Signed)
   07/03/19 0000  Psych Admission Type (Psych Patients Only)  Admission Status Voluntary  Psychosocial Assessment  Patient Complaints Anxiety  Eye Contact Fair  Facial Expression Flat  Affect Appropriate to circumstance  Speech Logical/coherent  Interaction Assertive  Motor Activity Other (Comment) (WDL)  Appearance/Hygiene Unremarkable  Behavior Characteristics Anxious  Mood Depressed  Thought Process  Coherency WDL  Content WDL  Delusions None reported or observed  Perception WDL  Hallucination None reported or observed  Judgment Impaired  Confusion None  Danger to Self  Current suicidal ideation? Passive  Self-Injurious Behavior Some self-injurious ideation observed or expressed.  No lethal plan expressed   Agreement Not to Harm Self Yes  Description of Agreement verbal contract for safety  Danger to Others  Danger to Others None reported or observed   Pt stated she felt the same

## 2019-07-03 NOTE — Progress Notes (Signed)
The patient stated in group that she slept for half of the day but learned some new coping skills. She said that she watched her peers color today and that she is interested in doing that as a coping skill. Her goal for tomorrow is to have a better day.

## 2019-07-03 NOTE — [facility] Suicide Risk Assessment (Signed)
[facility] INPATIENT:  Family/Significant Other Suicide Prevention Education  Suicide Prevention Education:  Contact Attempts: Alford Highland [phone], mother and boyfriend, Clemens Catholic, [phone] has been identified by the patient as the family member/significant other with whom the patient will be residing, and identified as the person(s) who will aid the patient in the event of a mental health crisis.  With written consent from the patient, two attempts were made to provide suicide prevention education, prior to and/or following the patient's discharge.  We were unsuccessful in providing suicide prevention education.  A suicide education pamphlet was given to the patient to share with family/significant other.  Date and time of first attempt: 07/02/2019 3:00PM Date and time of second attempt:07/03/2019 at 2:05PM  CSW attempted to contact the patient's mother as requested.  CSW notes that the line rang incessantly without the option of leaving a voicemial.  CSW attempted to contact the patients identified boyfriend. CSW was unable to speak with the boyfriend and left a HIPAA compliant voicemail.   Harden Mo 07/03/2019, 2:05 PM

## 2019-07-03 NOTE — Progress Notes (Addendum)
Pt endorsed passive SI. She stated that she did not have a plan or intention to harm herself, but stated her depression has been high today and thoughts of suicide persist.  Pt also endorsed feeling anxious.  Pt denied having HI or AVH.  RN administered medications as prescribed and validated pt's concerns and feelings.  Safety checks q 15 min remain in place.  Pt verbally contracts for safety.  RN will continue to monitor and provide support as needed.

## 2019-07-04 MED ORDER — CITALOPRAM HYDROBROMIDE 20 MG PO TABS
20.0000 mg | ORAL_TABLET | Freq: Every day | ORAL | Status: DC
  Administered 2019-07-05 – 2019-07-09 (×5): 20 mg via ORAL
  Filled 2019-07-04 (×6): qty 1

## 2019-07-04 NOTE — [facility] Group Notes (Signed)
LCSW Group Therapy Note  07/04/2019 1:30 PM  Type of Therapy/Topic:  Group Therapy:  Emotion Regulation  Participation Level:  Active   Description of Group:   The purpose of this group is to assist patients in learning to regulate negative emotions and experience positive emotions. Patients will be guided to discuss ways in which they have been vulnerable to their negative emotions. These vulnerabilities will be juxtaposed with experiences of positive emotions or situations, and patients will be challenged to use positive emotions to combat negative ones. Special emphasis will be placed on coping with negative emotions in conflict situations, and patients will process healthy conflict resolution skills.  Therapeutic Goals: 1. Patient will identify two positive emotions or experiences to reflect on in order to balance out negative emotions 2. Patient will label two or more emotions that they find the most difficult to experience 3. Patient will demonstrate positive conflict resolution skills through discussion and/or role plays  Summary of Patient Progress: Patient was present for group.  Patient engaged in group discussions and was supportive of other group members. Patient discussed how she cooks, cleans and listens to music as coping skills.  Patient reports that this is effective.  Patient stated that she struggles with effectively deescalating sometimes.    Therapeutic Modalities:   Cognitive Behavioral Therapy Feelings Identification Dialectical Behavioral Therapy  Michale Weikel, MSW, LCSW 07/04/2019 1:16 PM  

## 2019-07-04 NOTE — Progress Notes (Signed)
D:  Patient denied HI.  Denied visual hallucinations.  Hears voices off/on, mumblings.  SI off/on, but denied SI this morning.  Contracts for safety. A:  Medications administered per MD orders.  Emotional support and encouragement given patient. R:  Safety maintained with 15 minute checks.

## 2019-07-04 NOTE — Progress Notes (Signed)
[facility] Group Notes:  (Nursing/MHT/Case Management/Adjunct)  Date:  07/04/2019  Time:  2030  Type of Therapy:  wrap up group  Participation Level:  Active  Participation Quality:  Appropriate, Attentive, Sharing and Supportive  Affect:  Depressed  Cognitive:  Appropriate  Insight:  Improving  Engagement in Group:  Engaged  Modes of Intervention:  Clarification, Education and Support  Summary of Progress/Problems: Positive thinking and positive change were discussed.   Marcille Buffy 07/04/2019, 9:50 PM

## 2019-07-04 NOTE — Progress Notes (Signed)
Recreation Therapy Notes  Date:  3.17.21 Time: 0930 Location: [address] Dayroom  Group Topic: Stress Management  Goal Area(s) Addresses:  Patient will identify positive stress management techniques. Patient will identify benefits of using stress management post d/c.  Intervention: Stress Management  Activity : Guided Imagery.  LRT read a script that took patients on a journey through a walk in the forest.  Patients were to listen and follow along as LRT read script to engage in activity.  Education:  Stress Management, Discharge Planning.   Education Outcome: Acknowledges Education  Clinical Observations/Feedback:  Pt did not attend group activity .    Caroll Rancher, LRT/CTRS         Caroll Rancher A 07/04/2019 10:44 AM

## 2019-07-04 NOTE — Plan of Care (Signed)
Nurse discussed anxiety, depression, coping skills with patient. 
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
## 2019-07-04 NOTE — Progress Notes (Signed)
   07/04/19 2000  Psych Admission Type (Psych Patients Only)  Admission Status Voluntary  Psychosocial Assessment  Patient Complaints Anxiety  Eye Contact Fair  Facial Expression Flat  Affect Appropriate to circumstance  Speech Logical/coherent  Interaction Assertive  Motor Activity Other (Comment) (WDL)  Appearance/Hygiene Unremarkable  Behavior Characteristics Anxious  Mood Anxious  Thought Process  Coherency WDL  Content WDL  Delusions None reported or observed  Perception WDL  Hallucination Auditory  Judgment Impaired  Confusion None  Danger to Self  Current suicidal ideation? Passive  Self-Injurious Behavior Some self-injurious ideation observed or expressed.  No lethal plan expressed   Agreement Not to Harm Self Yes  Description of Agreement verbal contract for safety  Danger to Others  Danger to Others None reported or observed

## 2019-07-05 NOTE — [facility] Group Notes (Signed)
Pt attended Zoom meeting, via ipad.  

## 2019-07-05 NOTE — Progress Notes (Signed)
   07/05/19 0936  Psych Admission Type (Psych Patients Only)  Admission Status Voluntary  Psychosocial Assessment  Patient Complaints None  Eye Contact Fair  Facial Expression Flat  Affect Appropriate to circumstance  Speech Logical/coherent  Interaction Avoidant (pt is reclusive to her room)  Motor Activity Other (Comment) (WDL)  Appearance/Hygiene Unremarkable  Behavior Characteristics Cooperative  Mood Depressed  Aggressive Behavior  Targets Other (Comment) (no aggressive behaviors observed/reported)  Thought Process  Coherency WDL  Content WDL  Delusions None reported or observed  Perception WDL  Hallucination Auditory  Judgment Impaired  Confusion None  Danger to Self  Current suicidal ideation? Passive (pt states SI "comes and goes")  Self-Injurious Behavior No self-injurious ideation or behavior indicators observed or expressed   Agreement Not to Harm Self Yes  Description of Agreement verbal contract for safety  Danger to Others  Danger to Others None reported or observed
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
## 2019-07-05 NOTE — Progress Notes (Signed)
   07/05/19 2000  Psych Admission Type (Psych Patients Only)  Admission Status Voluntary  Psychosocial Assessment  Patient Complaints None  Eye Contact Fair  Facial Expression Flat  Affect Appropriate to circumstance  Speech Logical/coherent  Interaction Avoidant (pt is reclusive to her room)  Motor Activity Other (Comment) (WDL)  Appearance/Hygiene Unremarkable  Behavior Characteristics Cooperative  Mood Depressed  Aggressive Behavior  Targets Other (Comment) (no aggressive behaviors observed/reported)  Thought Process  Coherency WDL  Content WDL  Delusions None reported or observed  Perception WDL  Hallucination Auditory  Judgment Impaired  Confusion None  Danger to Self  Current suicidal ideation? Passive (pt states SI "comes and goes")  Self-Injurious Behavior No self-injurious ideation or behavior indicators observed or expressed   Agreement Not to Harm Self Yes  Description of Agreement verbal contract for safety  Danger to Others  Danger to Others None reported or observed

## 2019-07-06 NOTE — Progress Notes (Signed)
[employer] MD Progress Note  07/06/2019 9:37 AM Grace Grace [name]  MRN:  [id number]   Subjective:  Grace [name] states " I think I am feeling a little better". Denies suicidal ideations. Denies medication side effects at this time.  Objective:I have discussed case with treatment team and have met with Grace [name]. 34 year old female, presented to ED on 3/12 following an overdose on Tylenol PM.  Of note, admission acetaminophen  serum level less than 10.  Reported at [place] time she wanted to "go to sleep and not wake up".  She reported significant psychosocial stressors: CPS took custody of her children.  She states that in order to regain custody she needs to take a series of classes and demonstrate she has a place of her own (currently lives with boyfriend).  She has a history of prior psychiatric admissions for depression.  Reported use of "ecstasy" prior to admission but denies regular use or pattern of dependence.  Today Grace [name] reports she is feeling better than she did on admission.  Currently denies suicidal ideations. She appears less ruminative about her stressors  ( mainly related to not having custody of her children at this time, being unable to see her children recently due to [place] restrictions) .  She presents with partially improved mood , and today is noted to be more communicative, with a more reactive affect . No disruptive or agitated behaviors on unit, going to some groups . At this time tolerating Seroquel/Celexa well , denies side effects. As noted, Grace [name] has prior history of hyperthyroidism, which was initially diagnosed in 2007. She reports she has been on Tapazole " on and off" since then . Currently does not present restless, no tremors are noted, vitals stable ( 93/69, pulse 80) .     Principal Problem: Severe episode of recurrent major depressive disorder, with psychotic features ([place]) Diagnosis: Principal Problem:   Severe episode of recurrent major depressive disorder, with  psychotic features ([place]) Active Problems:   Severe recurrent major depression without psychotic features ([place])  Total Time spent with Grace [name]: 20 minutes  Past Psychiatric History: See admission H&P  Past Medical History:  Past Medical History:  Diagnosis Date  . Cholecystitis 08/18/2011  . Headache(784.0)    otc med prn  . History of recurrent UTIs    recurrent UTIs  . Hypertension   . Hyperthyroidism   . Joint pain    knees and shoulders - otc med prn  . Sickle cell trait ([place])   . Sickle cell trait ([place])   . SVD (spontaneous vaginal delivery)    x 3 WH    Past Surgical History:  Procedure Laterality Date  . CHOLECYSTECTOMY  08/19/2011   Procedure: LAPAROSCOPIC CHOLECYSTECTOMY WITH INTRAOPERATIVE CHOLANGIOGRAM;  Surgeon: Madilyn Hook, DO;  Location: [facility];  Service: General;  Laterality: N/A;  . DILATION AND CURETTAGE OF UTERUS     molar preg  . LAPAROSCOPIC BILATERAL SALPINGECTOMY Bilateral 05/09/2013   Procedure: LAPAROSCOPIC BILATERAL SALPINGECTOMY;  Surgeon: Emily Filbert, MD;  Location: [facility];  Service: Gynecology;  Laterality: Bilateral;   Family History:  Family History  Problem Relation Age of Onset  . Diabetes Mother   . Cancer Father   . Diabetes Maternal Uncle   . Cancer Maternal Grandmother   . Cancer Maternal Grandfather   . Hypertension Maternal Grandfather   . Diabetes Maternal Grandfather   . Sickle cell anemia Son   . Asthma Son   . Heart disease Paternal Grandfather   . Hearing loss  Paternal Aunt    Family Psychiatric  History: See admission H&P Social History:  Social History   Substance and Sexual Activity  Alcohol Use No     Social History   Substance and Sexual Activity  Drug Use No    Social History   Socioeconomic History  . Marital status: Single    Spouse name: Not on file  . Number of children: Not on file  . Years of education: Not on file  . Highest education level: Not on file  Occupational History  . Not on file   Tobacco Use  . Smoking status: Never Smoker  . Smokeless tobacco: Never Used  Substance and Sexual Activity  . Alcohol use: No  . Drug use: No  . Sexual activity: Yes    Birth control/protection: Surgical  Other Topics Concern  . Not on file  Social History Narrative   2 Adults, 4 children at home   FOB involved   Social Determinants of Health   Financial Resource Strain:   . Difficulty of Paying Living Expenses:   Food Insecurity:   . Worried About Charity fundraiser in [place] Last Year:   . Arboriculturist in [place] Last Year:   Transportation Needs:   . Film/video editor (Medical):   Marland Kitchen Lack of Transportation (Non-Medical):   Physical Activity:   . Days of Exercise per Week:   . Minutes of Exercise per Session:   Stress:   . Feeling of Stress :   Social Connections:   . Frequency of Communication with Friends and Family:   . Frequency of Social Gatherings with Friends and Family:   . Attends Religious Services:   . Active Member of Clubs or Organizations:   . Attends Archivist Meetings:   Marland Kitchen Marital Status:    Additional Social History:   Sleep: good   Appetite:Improving  Current Medications: Current Facility-Administered Medications  Medication Dose Route Frequency Provider Last Rate Last Admin  . acetaminophen (TYLENOL) tablet 650 mg  650 mg Oral Q6H PRN Lindon Romp A, NP      . alum & mag hydroxide-simeth (MAALOX/MYLANTA) 200-200-20 MG/5ML suspension 30 mL  30 mL Oral Q4H PRN Lindon Romp A, NP      . citalopram (CELEXA) tablet 20 mg  20 mg Oral Daily Behr Cislo, Myer Peer, MD   20 mg at 07/06/19 0905  . cyclobenzaprine (FLEXERIL) tablet 5 mg  5 mg Oral TID PRN Sharma Covert, MD   5 mg at 07/05/19 2141  . hydrOXYzine (ATARAX/VISTARIL) tablet 50 mg  50 mg Oral Q8H PRN Lindon Romp A, NP   50 mg at 07/06/19 0905  . magnesium hydroxide (MILK OF MAGNESIA) suspension 30 mL  30 mL Oral Daily PRN Lindon Romp A, NP      . methimazole (TAPAZOLE) tablet  20 mg  20 mg Oral BID Lindon Romp A, NP   20 mg at 07/06/19 0903  . metoprolol tartrate (LOPRESSOR) tablet 12.5 mg  12.5 mg Oral BID Lindon Romp A, NP   12.5 mg at 07/06/19 0904  . pantoprazole (PROTONIX) EC tablet 40 mg  40 mg Oral Daily Sharma Covert, MD   40 mg at 07/06/19 1856  . prazosin (MINIPRESS) capsule 1 mg  1 mg Oral QHS Lindell Spar I, NP   1 mg at 07/05/19 2141  . QUEtiapine (SEROQUEL) tablet 300 mg  300 mg Oral QHS Sharma Covert, MD   300 mg at 07/05/19  2140    Lab Results:  No results found for this or any previous visit (from [place] past 48 hour(s)).  Blood Alcohol level:  Lab Results  Component Value Date   ETH <10 06/29/2019   ETH <5 16/01/9603    Metabolic Disorder Labs: Lab Results  Component Value Date   HGBA1C 5.0 07/01/2019   MPG 96.8 07/01/2019   MPG 94 01/09/2016   Lab Results  Component Value Date   PROLACTIN 35.2 (H) 01/09/2016   Lab Results  Component Value Date   CHOL 133 07/01/2019   TRIG 70 07/01/2019   HDL 31 (L) 07/01/2019   CHOLHDL 4.3 07/01/2019   VLDL 14 07/01/2019   LDLCALC 88 07/01/2019   LDLCALC 68 01/09/2016    Physical Findings: AIMS: Facial and Oral Movements Muscles of Facial Expression: None, normal Lips and Perioral Area: None, normal Jaw: None, normal Tongue: None, normal,Extremity Movements Upper (arms, wrists, hands, fingers): None, normal Lower (legs, knees, ankles, toes): None, normal, Trunk Movements Neck, shoulders, hips: None, normal, Overall Severity Severity of abnormal movements (highest score from questions above): None, normal Incapacitation due to abnormal movements: None, normal Grace [name]'s awareness of abnormal movements (rate only Grace [name]'s report): No Awareness, Dental Status Current problems with teeth and/or dentures?: No Does Grace [name] usually wear dentures?: No  CIWA:  CIWA-Ar Total: 0 COWS:     Musculoskeletal: Strength & Muscle Tone: within normal limits Gait & Station: normal Grace [name]  leans: N/A  Psychiatric Specialty Exam: Physical Exam  Nursing note and vitals reviewed. Constitutional: She is oriented to person, place, and time. She appears well-developed and well-nourished.  HENT:  Head: Normocephalic and atraumatic.  Cardiovascular: Normal rate.  Respiratory: Effort normal.  Musculoskeletal:        General: Normal range of motion.  Neurological: She is alert and oriented to person, place, and time.  Skin: Skin is warm and dry.  Psychiatric: Her mood appears anxious. She is not withdrawn. Thought content is not paranoid and not delusional. She exhibits a depressed mood. She expresses no homicidal and no suicidal ideation.    Review of Systems  Constitutional: Negative for activity change, chills, diaphoresis, fatigue, fever and unexpected weight change. Appetite change: Improving.  HENT: Negative for congestion and sore throat.   Respiratory: Negative for cough and shortness of breath.   Cardiovascular: Negative for chest pain and palpitations.  Gastrointestinal: Negative for diarrhea, nausea and vomiting.  Musculoskeletal: Negative.   Neurological: Negative for dizziness and headaches.  Psychiatric/Behavioral: Positive for dysphoric mood, hallucinations, sleep disturbance and suicidal ideas. Grace Grace [name] is nervous/anxious.   All other systems reviewed and are negative.  no headache, no chest pain, no shortness of breath, no nausea no vomiting.  Blood pressure 93/69, pulse 80, temperature 98.1 F (36.7 C), temperature source Oral, resp. rate 18, height 5' 4.25" (1.632 m), weight 61.2 kg, SpO2 100 %.Body mass index is 22.99 kg/m.  General Appearance: Casual  Eye Contact:  improving   Speech:  Normal Rate  Volume:  Normal  Mood:  reports her mood is " getting better"  Affect:  less constricted, smiles at times appropriately  Thought Process:  Linear and Descriptions of Associations: Intact  Orientation:  Full (Time, Place, and Person)  Thought Content:  No  delusions, not internally preoccupied.  Suicidal Thoughts:  No today denies suicidal or self injurious ideations and contracts for safety on unit   Homicidal Thoughts:  No  Memory:  Recent and remote grossly intact  Judgement:  Other:  improved  Insight:  Fair and improving   Psychomotor Activity:  Normal  Concentration:  Concentration: Good and Attention Span: Good  Recall:  Good  Fund of Knowledge:  Good  Language:  Good  Akathisia:  Negative  Handed:  Right  AIMS (if indicated):     Assets:  Desire for Improvement Resilience  ADL's:  Intact  Cognition:  WNL  Sleep:  Number of Hours: 6.75    Assessment -  34 year old female, presented to ED on 3/12 following an overdose on Tylenol PM.  Of note, admission acetaminophen  serum level less than 10.  Reported at [place] time she wanted to "go to sleep and not wake up".  She reported significant psychosocial stressors: CPS took custody of her children.  She states that in order to regain custody she needs to take a series of classes and demonstrate she has a place of her own (currently lives with boyfriend).  She has a history of prior psychiatric admissions for depression.  Reported use of "ecstasy" prior to admission but denies regular use or pattern of dependence.  Currently Grace [name] reports improving mood and does present with a more reactive affect at this time. Also today denies SI, and presents future oriented. Denies medication side effects.   Treatment Plan Summary: Treatment Plan reviewed as below today 3/19 Continue Celexa 20 mg daily for depression Continue Seroquel 300 mg nightly for mood disorder/insomnia Continue Minipress 1 mg nightly for nightmares Continue Lopressor 12.5 mg twice daily for hypertension Continue Tapazole 20 mg twice daily for history of hyperthyroidism Treatment team working on disposition planning options  Jenne Campus, MD 07/06/2019, 9:37 AM   Grace [name] ID: Grace Grace [name], female   DOB:  09-Sep-1985, 34 y.o.   MRN: [id number]

## 2019-07-06 NOTE — [facility] Group Notes (Signed)
LCSW Group Therapy Note 07/06/2019 1:31 PM  Type of Therapy/Topic: Group Therapy: Emotion Regulation  Participation Level: Minimal   Description of Group:  The purpose of this group is to assist patients in learning to regulate negative emotions and experience positive emotions. Patients will be guided to discuss ways in which they have been vulnerable to their negative emotions. These vulnerabilities will be juxtaposed with experiences of positive emotions or situations, and patients will be challenged to use positive emotions to combat negative ones. Special emphasis will be placed on coping with negative emotions in conflict situations, and patients will process healthy conflict resolution skills.  Therapeutic Goals: 1. Patient will identify two positive emotions or experiences to reflect on in order to balance out negative emotions 2. Patient will label two or more emotions that they find the most difficult to experience 3. Patient will demonstrate positive conflict resolution skills through discussion and/or role plays  Summary of Patient Progress:  Grace [name] was engaged, however she did not contribute to the group's discussion. CSW reviewed packet content and offered individual feedback once packet was complete. Grace [name] did not report any questions or concerns regarding the packets content and/or instructions.    Therapeutic Modalities:  Cognitive Behavioral Therapy Feelings Identification Dialectical Behavioral Therapy   Alcario Drought Clinical Social Worker

## 2019-07-06 NOTE — Progress Notes (Signed)
Recreation Therapy Notes  Date:  3.19.21 Time: 0930 Location: [address] Group Room  Group Topic: Stress Management  Goal Area(s) Addresses:  Patient will identify positive stress management techniques. Patient will identify benefits of using stress management post d/c.  Intervention: Stress Management  Activity: Progressive Muscle Relaxation.  LRT led group in tensing and releasing each muscle individually.  Patients were to listen and follow along to engage in activity.     Education:  Stress Management, Discharge Planning.   Education Outcome: Acknowledges Education  Clinical Observations/Feedback: Patient did not attend group activity.     Caroll Rancher, LRT/CTRS         Caroll Rancher A 07/06/2019 11:56 AM

## 2019-07-06 NOTE — Tx Team (Signed)
Interdisciplinary Treatment and Diagnostic Plan Update  07/06/2019 Time of Session: 9:00AM [name] MRN: [id number]  Principal Diagnosis: Severe episode of recurrent major depressive disorder, with psychotic features (HCC)  Secondary Diagnoses: Principal Problem:   Severe episode of recurrent major depressive disorder, with psychotic features (HCC) Active Problems:   Severe recurrent major depression without psychotic features (HCC)   Current Medications:  Current Facility-Administered Medications  Medication Dose Route Frequency Provider Last Rate Last Admin  . acetaminophen (TYLENOL) tablet 650 mg  650 mg Oral Q6H PRN Nira Conn A, NP      . alum & mag hydroxide-simeth (MAALOX/MYLANTA) 200-200-20 MG/5ML suspension 30 mL  30 mL Oral Q4H PRN Nira Conn A, NP      . citalopram (CELEXA) tablet 20 mg  20 mg Oral Daily Cobos, Rockey Situ, MD   20 mg at 07/06/19 0905  . cyclobenzaprine (FLEXERIL) tablet 5 mg  5 mg Oral TID PRN Antonieta Pert, MD   5 mg at 07/05/19 2141  . hydrOXYzine (ATARAX/VISTARIL) tablet 50 mg  50 mg Oral Q8H PRN Nira Conn A, NP   50 mg at 07/06/19 0905  . magnesium hydroxide (MILK OF MAGNESIA) suspension 30 mL  30 mL Oral Daily PRN Nira Conn A, NP      . methimazole (TAPAZOLE) tablet 20 mg  20 mg Oral BID Nira Conn A, NP   20 mg at 07/06/19 0903  . metoprolol tartrate (LOPRESSOR) tablet 12.5 mg  12.5 mg Oral BID Nira Conn A, NP   12.5 mg at 07/06/19 0904  . pantoprazole (PROTONIX) EC tablet 40 mg  40 mg Oral Daily Antonieta Pert, MD   40 mg at 07/06/19 6644  . prazosin (MINIPRESS) capsule 1 mg  1 mg Oral QHS Armandina Stammer I, NP   1 mg at 07/05/19 2141  . QUEtiapine (SEROQUEL) tablet 300 mg  300 mg Oral QHS Antonieta Pert, MD   300 mg at 07/05/19 2140   PTA Medications: Medications Prior to Admission  Medication Sig Dispense Refill Last Dose  . buPROPion (WELLBUTRIN XL) 150 MG 24 hr tablet Take 1 tablet (150 mg total) by mouth daily.  For depression (Patient not taking: Reported on 06/29/2019) 30 tablet 0   . citalopram (CELEXA) 10 MG tablet Take 3 tablets (30 mg total) by mouth daily. For depression (Patient not taking: Reported on 06/29/2019) 90 tablet 0   . diphenhydramine-acetaminophen (TYLENOL PM) 25-500 MG TABS tablet Take 1 tablet by mouth at bedtime as needed (sleep).     . hydrOXYzine (ATARAX/VISTARIL) 50 MG tablet Take 1 tablet (50 mg total) by mouth every 8 (eight) hours as needed for anxiety. (Patient not taking: Reported on 06/29/2019) 60 tablet 0   . methimazole (TAPAZOLE) 10 MG tablet Take 2 tablets (20 mg total) by mouth 2 (two) times daily. For hyperthyroidim (Patient not taking: Reported on 06/29/2019) 1 tablet 0   . metoCLOPramide (REGLAN) 10 MG tablet Take 1 tablet (10 mg total) by mouth every 6 (six) hours as needed for nausea (or headache). (Patient not taking: Reported on 06/29/2019) 30 tablet 0   . metoprolol tartrate (LOPRESSOR) 25 MG tablet Take 0.5 tablets (12.5 mg total) by mouth 2 (two) times daily. For high blood pressure (Patient not taking: Reported on 06/29/2019) 14 tablet 0   . promethazine (PHENERGAN) 12.5 MG tablet Take 1 tablet (12.5 mg total) by mouth 2 (two) times daily. (Patient not taking: Reported on 06/29/2019) 6 tablet 0   . QUEtiapine (  SEROQUEL) 25 MG tablet Take 1 tablet (25 mg total) by mouth daily. For agitation (Patient not taking: Reported on 06/29/2019) 30 tablet 0   . QUEtiapine (SEROQUEL) 50 MG tablet Take 5 tablets (250 mg total) by mouth at bedtime. 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   Physician Treatment Plan for Primary  Diagnosis: Severe episode of recurrent major depressive disorder, with psychotic features (HCC) Long Term Goal(s): Improvement in symptoms so as ready for discharge Improvement in symptoms so as ready for discharge   Short Term Goals: Ability to identify changes in lifestyle to reduce recurrence of condition will improve Ability to verbalize feelings will improve Ability to disclose and discuss suicidal ideas Ability to demonstrate self-control will improve Ability to identify and develop effective coping behaviors will improve Ability to maintain clinical measurements within normal limits will improve Compliance with prescribed medications will improve Ability to identify triggers associated with substance abuse/mental health issues will improve Ability to identify changes in lifestyle to reduce recurrence of condition will improve Ability to verbalize feelings will improve Ability to disclose and discuss suicidal ideas Ability to demonstrate self-control will improve Ability to identify and develop effective coping behaviors will improve Ability to maintain clinical measurements within normal limits will improve Compliance with prescribed medications will improve Ability to identify triggers associated with substance abuse/mental health issues will improve  Medication Management: Evaluate patient's response, side effects, and tolerance of medication regimen.  Therapeutic Interventions: 1 to 1 sessions, Unit Group sessions and Medication administration.  Evaluation of Outcomes: Progressing  Physician Treatment Plan for Secondary Diagnosis: Principal Problem:   Severe episode of recurrent major depressive disorder, with psychotic features (HCC) Active Problems:   Severe recurrent major depression without psychotic features (HCC)  Long Term Goal(s): Improvement in symptoms so as ready for discharge Improvement in symptoms so as ready for discharge   Short Term Goals: Ability to  identify changes in lifestyle to reduce recurrence of condition will improve Ability to verbalize feelings will improve Ability to disclose and discuss suicidal ideas Ability to demonstrate self-control will improve Ability to identify and develop effective coping behaviors will improve Ability to maintain clinical measurements within normal limits will improve Compliance with prescribed medications will improve Ability to identify triggers associated with substance abuse/mental health issues will improve Ability to identify changes in lifestyle to reduce recurrence of condition will improve Ability to verbalize feelings will improve Ability to disclose and discuss suicidal ideas Ability to demonstrate self-control will improve Ability to identify and develop effective coping behaviors will improve Ability to maintain clinical measurements within normal limits will improve Compliance with prescribed medications will improve Ability to identify triggers associated with substance abuse/mental health issues will improve     Medication Management: Evaluate patient's response, side effects, and tolerance of medication regimen.  Therapeutic Interventions: 1 to 1 sessions, Unit Group sessions and Medication administration.  Evaluation of Outcomes: Progressing   RN Treatment Plan for Primary Diagnosis: Severe episode of recurrent major depressive disorder, with psychotic features (HCC) Long Term Goal(s): Knowledge of disease and therapeutic regimen to maintain health will improve  Short Term Goals: Ability to demonstrate self-control, Ability to participate in decision making will improve, Ability to verbalize feelings will improve,  Ability to disclose and discuss suicidal ideas, Ability to identify and develop effective coping behaviors will improve and Compliance with prescribed medications will improve  Medication Management: RN will administer medications as ordered by provider, will assess  and evaluate patient's response and provide education to patient for prescribed medication. RN will report any adverse and/or side effects to prescribing provider.  Therapeutic Interventions: 1 on 1 counseling sessions, Psychoeducation, Medication administration, Evaluate responses to treatment, Monitor vital signs and CBGs as ordered, Perform/monitor CIWA, COWS, AIMS and Fall Risk screenings as ordered, Perform wound care treatments as ordered.  Evaluation of Outcomes: Progressing   LCSW Treatment Plan for Primary Diagnosis: Severe episode of recurrent major depressive disorder, with psychotic features ([place]) Long Term Goal(s): Safe transition to appropriate next level of care at discharge, Engage patient in therapeutic group addressing interpersonal concerns.  Short Term Goals: Engage patient in aftercare planning with referrals and resources, Increase social support, Increase ability to appropriately verbalize feelings, Increase emotional regulation, Facilitate acceptance of mental health diagnosis and concerns and Increase skills for wellness and recovery  Therapeutic Interventions: Assess for all discharge needs, 1 to 1 time with Social worker, Explore available resources and support systems, Assess for adequacy in community support network, Educate family and significant other(s) on suicide prevention, Complete Psychosocial Assessment, Interpersonal group therapy.  Evaluation of Outcomes: Progressing   Progress in Treatment: Attending groups: Yes. Participating in groups: Yes. Taking medication as prescribed: Yes. Toleration medication: Yes. Family/Significant other contact made: No, will contact:  once permission is given. Patient understands diagnosis: Yes. Discussing patient identified problems/goals with staff: Yes. Medical problems stabilized or resolved: Yes. Denies suicidal/homicidal ideation: No. Issues/concerns per patient self-inventory: No. Other: none  New problem(s)  identified: No, Describe:  none  New Short Term/Long Term Goal(s): medication management for mood stabilization; elimination of SI thoughts; development of comprehensive mental wellness plan.  Patient Goals:  "be free of the suicidal thoughts and doing new coping skills"  Discharge Plan or Barriers: Patient will return home with her boyfriend. And will continue to follow up with her current provider. CSW will assist in further aftercare plans.  Reason for Continuation of Hospitalization: Anxiety Depression Medication stabilization Suicidal ideation  Estimated Length of Stay:  2-3 days   Attendees: Patient:  Grace [name] 07/06/2019 10:24 AM  Physician: Dr. Mallie Darting, MD; Dr. Neita Garnet, MD 07/06/2019 10:24 AM  Nursing:  07/06/2019 10:24 AM  RN Care Manager: 07/06/2019 10:24 AM  Social Worker: Assunta Curtis, LCSW; [place], LCSW 07/06/2019 10:24 AM  Recreational Therapist:  07/06/2019 10:24 AM  Other:  07/06/2019 10:24 AM  Other:  07/06/2019 10:24 AM  Other: 07/06/2019 10:24 AM    Scribe for Treatment Team: Marylee Floras, [place] 07/06/2019 10:24 AM

## 2019-07-06 NOTE — Progress Notes (Signed)
   07/06/19 0915  Psych Admission Type (Psych Patients Only)  Admission Status Voluntary  Psychosocial Assessment  Patient Complaints Anxiety  Eye Contact Fair  Facial Expression Flat  Affect Appropriate to circumstance  Speech Logical/coherent  Interaction Avoidant (pt is reclusive to her room)  Motor Activity Other (Comment) (WDL)  Appearance/Hygiene Unremarkable  Behavior Characteristics Cooperative  Mood Anxious  Aggressive Behavior  Targets Other (Comment) (no aggressive behaviors observed/reported)  Thought Process  Coherency WDL  Content WDL  Delusions None reported or observed  Perception WDL  Hallucination None reported or observed  Judgment Impaired  Confusion None  Danger to Self  Current suicidal ideation? Denies (pt states SI "comes and goes")  Self-Injurious Behavior No self-injurious ideation or behavior indicators observed or expressed   Agreement Not to Harm Self Yes  Description of Agreement verbal contract for safety  Danger to Others  Danger to Others None reported or observed

## 2019-07-07 NOTE — Progress Notes (Signed)
[facility] MD Progress Note  07/07/2019 12:32 PM [place]  MRN:  [id number]   Subjective: patient continues to report she is feeling better , and states " I have been feeling a little better these last couple of days". Denies medication side effects, denies suicidal ideations.  Objective:I have reviewed chart notes  and have met with patient. 34 year old female, presented to ED on 3/12 following an overdose on Tylenol PM.  Of note, admission acetaminophen  serum level less than 10.  Reported at the time she wanted to "go to sleep and not wake up".  She reported significant psychosocial stressors: CPS took custody of her children.  She states that in order to regain custody she needs to take a series of classes and demonstrate she has a place of her own (currently lives with boyfriend).  She has a history of prior psychiatric admissions for depression.  Reported use of "ecstasy" prior to admission but denies regular use or pattern of dependence.  Patient is presenting with improving mood and range of affect . States she is feeling better. Denies suicidal ideations at this time.  As she improves she is noted to be more visible in day room, less isolative and has been going to some groups. Denies medication side effects.     Principal Problem: Severe episode of recurrent major depressive disorder, with psychotic features ([place]) Diagnosis: Principal Problem:   Severe episode of recurrent major depressive disorder, with psychotic features ([place]) Active Problems:   Severe recurrent major depression without psychotic features ([place])  Total Time spent with patient: 15 minutes  Past Psychiatric History: See admission H&P  Past Medical History:  Past Medical History:  Diagnosis Date  . Cholecystitis 08/18/2011  . Headache(784.0)    otc med prn  . History of recurrent UTIs    recurrent UTIs  . Hypertension   . Hyperthyroidism   . Joint pain    knees and shoulders - otc med prn  . Sickle cell  trait ([place])   . Sickle cell trait ([place])   . SVD (spontaneous vaginal delivery)    x 3 WH    Past Surgical History:  Procedure Laterality Date  . CHOLECYSTECTOMY  08/19/2011   Procedure: LAPAROSCOPIC CHOLECYSTECTOMY WITH INTRAOPERATIVE CHOLANGIOGRAM;  Surgeon: Madilyn Hook, DO;  Location: [facility];  Service: General;  Laterality: N/A;  . DILATION AND CURETTAGE OF UTERUS     molar preg  . LAPAROSCOPIC BILATERAL SALPINGECTOMY Bilateral 05/09/2013   Procedure: LAPAROSCOPIC BILATERAL SALPINGECTOMY;  Surgeon: Emily Filbert, MD;  Location: [facility];  Service: Gynecology;  Laterality: Bilateral;   Family History:  Family History  Problem Relation Age of Onset  . Diabetes Mother   . Cancer Father   . Diabetes Maternal Uncle   . Cancer Maternal Grandmother   . Cancer Maternal Grandfather   . Hypertension Maternal Grandfather   . Diabetes Maternal Grandfather   . Sickle cell anemia Son   . Asthma Son   . Heart disease Paternal Grandfather   . Hearing loss Paternal Aunt    Family Psychiatric  History: See admission H&P Social History:  Social History   Substance and Sexual Activity  Alcohol Use No     Social History   Substance and Sexual Activity  Drug Use No    Social History   Socioeconomic History  . Marital status: Single    Spouse name: Not on file  . Number of children: Not on file  . Years of education: Not on file  .  Highest education level: Not on file  Occupational History  . Not on file  Tobacco Use  . Smoking status: Never Smoker  . Smokeless tobacco: Never Used  Substance and Sexual Activity  . Alcohol use: No  . Drug use: No  . Sexual activity: Yes    Birth control/protection: Surgical  Other Topics Concern  . Not on file  Social History Narrative   2 Adults, 4 children at home   FOB involved   Social Determinants of Health   Financial Resource Strain:   . Difficulty of Paying Living Expenses:   Food Insecurity:   . Worried About Charity fundraiser in  the Last Year:   . Arboriculturist in the Last Year:   Transportation Needs:   . Film/video editor (Medical):   Marland Kitchen Lack of Transportation (Non-Medical):   Physical Activity:   . Days of Exercise per Week:   . Minutes of Exercise per Session:   Stress:   . Feeling of Stress :   Social Connections:   . Frequency of Communication with Friends and Family:   . Frequency of Social Gatherings with Friends and Family:   . Attends Religious Services:   . Active Member of Clubs or Organizations:   . Attends Archivist Meetings:   Marland Kitchen Marital Status:    Additional Social History:   Sleep: good   Appetite:Improving  Current Medications: Current Facility-Administered Medications  Medication Dose Route Frequency Provider Last Rate Last Admin  . acetaminophen (TYLENOL) tablet 650 mg  650 mg Oral Q6H PRN Lindon Romp A, NP      . alum & mag hydroxide-simeth (MAALOX/MYLANTA) 200-200-20 MG/5ML suspension 30 mL  30 mL Oral Q4H PRN Lindon Romp A, NP      . citalopram (CELEXA) tablet 20 mg  20 mg Oral Daily , Myer Peer, MD   20 mg at 07/07/19 0936  . cyclobenzaprine (FLEXERIL) tablet 5 mg  5 mg Oral TID PRN Sharma Covert, MD   5 mg at 07/05/19 2141  . hydrOXYzine (ATARAX/VISTARIL) tablet 50 mg  50 mg Oral Q8H PRN Lindon Romp A, NP   50 mg at 07/07/19 0937  . magnesium hydroxide (MILK OF MAGNESIA) suspension 30 mL  30 mL Oral Daily PRN Lindon Romp A, NP      . methimazole (TAPAZOLE) tablet 20 mg  20 mg Oral BID Lindon Romp A, NP   20 mg at 07/07/19 0935  . metoprolol tartrate (LOPRESSOR) tablet 12.5 mg  12.5 mg Oral BID Lindon Romp A, NP   12.5 mg at 07/07/19 0936  . pantoprazole (PROTONIX) EC tablet 40 mg  40 mg Oral Daily Sharma Covert, MD   40 mg at 07/07/19 0936  . prazosin (MINIPRESS) capsule 1 mg  1 mg Oral QHS [place], Agnes I, NP   1 mg at 07/05/19 2141  . QUEtiapine (SEROQUEL) tablet 300 mg  300 mg Oral QHS Sharma Covert, MD   300 mg at 07/06/19 2157     Lab Results:  No results found for this or any previous visit (from the past 57 hour(s)).  Blood Alcohol level:  Lab Results  Component Value Date   [facility] <10 06/29/2019   ETH <5 35/57/3220    Metabolic Disorder Labs: Lab Results  Component Value Date   HGBA1C 5.0 07/01/2019   MPG 96.8 07/01/2019   MPG 94 01/09/2016   Lab Results  Component Value Date   PROLACTIN 35.2 (H) 01/09/2016  Lab Results  Component Value Date   CHOL 133 07/01/2019   TRIG 70 07/01/2019   HDL 31 (L) 07/01/2019   CHOLHDL 4.3 07/01/2019   VLDL 14 07/01/2019   LDLCALC 88 07/01/2019   LDLCALC 68 01/09/2016    Physical Findings: AIMS: Facial and Oral Movements Muscles of Facial Expression: None, normal Lips and Perioral Area: None, normal Jaw: None, normal Tongue: None, normal,Extremity Movements Upper (arms, wrists, hands, fingers): None, normal Lower (legs, knees, ankles, toes): None, normal, Trunk Movements Neck, shoulders, hips: None, normal, Overall Severity Severity of abnormal movements (highest score from questions above): None, normal Incapacitation due to abnormal movements: None, normal Patient's awareness of abnormal movements (rate only patient's report): No Awareness, Dental Status Current problems with teeth and/or dentures?: No Does patient usually wear dentures?: No  CIWA:  CIWA-Ar Total: 0 COWS:     Musculoskeletal: Strength & Muscle Tone: within normal limits Gait & Station: normal Patient leans: N/A  Psychiatric Specialty Exam: Physical Exam  Nursing note and vitals reviewed. Constitutional: She is oriented to person, place, and time. She appears well-developed and well-nourished.  HENT:  Head: Normocephalic and atraumatic.  Cardiovascular: Normal rate.  Respiratory: Effort normal.  Musculoskeletal:        General: Normal range of motion.  Neurological: She is alert and oriented to person, place, and time.  Skin: Skin is warm and dry.  Psychiatric: Her mood  appears anxious. She is not withdrawn. Thought content is not paranoid and not delusional. She exhibits a depressed mood. She expresses no homicidal and no suicidal ideation.    Review of Systems  Constitutional: Negative for activity change, chills, diaphoresis, fatigue, fever and unexpected weight change. Appetite change: Improving.  HENT: Negative for congestion and sore throat.   Respiratory: Negative for cough and shortness of breath.   Cardiovascular: Negative for chest pain and palpitations.  Gastrointestinal: Negative for diarrhea, nausea and vomiting.  Musculoskeletal: Negative.   Neurological: Negative for dizziness and headaches.  Psychiatric/Behavioral: Positive for dysphoric mood, hallucinations, sleep disturbance and suicidal ideas. The patient is nervous/anxious.   All other systems reviewed and are negative. denies dizziness or lightheadedness, no  headache, no chest pain, no shortness of breath, no nausea no vomiting.  Blood pressure 105/79, pulse (!) 103, temperature 98.1 F (36.7 C), temperature source Oral, resp. rate 18, height 5' 4.25" (1.632 m), weight 61.2 kg, SpO2 95 %.Body mass index is 22.99 kg/m.  General Appearance: Casual  Eye Contact:  improving   Speech:  Normal Rate  Volume:  Normal  Mood:  mood improving   Affect:  improving affect , less blunted/ becoming more reactive  Thought Process:  Linear and Descriptions of Associations: Intact  Orientation:  Full (Time, Place, and Person)  Thought Content:  No delusions, not internally preoccupied.  Suicidal Thoughts:  No today denies suicidal or self injurious ideations and contracts for safety on unit   Homicidal Thoughts:  No  Memory:  Recent and remote grossly intact  Judgement:  Other:  improved   Insight:  Fair and improving   Psychomotor Activity:  Normal  Concentration:  Concentration: Good and Attention Span: Good  Recall:  Good  Fund of Knowledge:  Good  Language:  Good  Akathisia:  Negative   Handed:  Right  AIMS (if indicated):     Assets:  Desire for Improvement Resilience  ADL's:  Intact  Cognition:  WNL  Sleep:  Number of Hours: 5.75    Assessment -  34 year old female, presented  to ED on 3/12 following an overdose on Tylenol PM.  Of note, admission acetaminophen  serum level less than 10.  Reported at the time she wanted to "go to sleep and not wake up".  She reported significant psychosocial stressors: CPS took custody of her children.  She states that in order to regain custody she needs to take a series of classes and demonstrate she has a place of her own (currently lives with boyfriend).  She has a history of prior psychiatric admissions for depression.  Reported use of "ecstasy" prior to admission but denies regular use or pattern of dependence.  Patient continues to report gradually improving mood and her affect appears more reactive. Denies suicidal ideations at this time, and is noted to be less isolative, more visible on unit, spending time in day room. Tolerating medications well .   Treatment Plan Summary: Treatment Plan reviewed as below today 3/20 Continue Celexa 20 mg daily for depression Continue Seroquel 300 mg nightly for mood disorder/insomnia Continue Minipress 1 mg nightly for nightmares Continue Lopressor 12.5 mg twice daily for hypertension Continue Tapazole 20 mg twice daily for history of hyperthyroidism Treatment team working on disposition planning options  Jenne Campus, MD 07/07/2019, 12:32 PM   Patient ID: Lora Paula, female   DOB: April 11, 1986, 34 y.o.   MRN: [id number]

## 2019-07-07 NOTE — Progress Notes (Signed)
   07/07/19 0930  Psych Admission Type (Psych Patients Only)  Admission Status Voluntary  Psychosocial Assessment  Patient Complaints None  Eye Contact Fair  Facial Expression Flat  Affect Appropriate to circumstance  Speech Logical/coherent  Interaction Minimal  Motor Activity Other (Comment) (wdl)  Appearance/Hygiene Unremarkable  Behavior Characteristics Cooperative;Appropriate to situation  Mood Anxious;Depressed  Thought Process  Coherency WDL  Content WDL  Delusions WDL  Perception WDL  Hallucination None reported or observed  Judgment WDL  Confusion WDL  Danger to Self  Current suicidal ideation? Denies  Danger to Others  Danger to Others None reported or observed

## 2019-07-07 NOTE — Progress Notes (Signed)
   07/07/19 0432  Psych Admission Type (Psych Patients Only)  Admission Status Voluntary  Psychosocial Assessment  Patient Complaints Sleep disturbance  Eye Contact Fair  Facial Expression Flat  Affect Appropriate to circumstance  Speech Logical/coherent  Interaction Minimal  Appearance/Hygiene Unremarkable  Behavior Characteristics Cooperative;Appropriate to situation  Mood Depressed;Anxious  Thought Process  Coherency WDL  Content WDL  Delusions WDL  Perception WDL  Hallucination None reported or observed  Judgment WDL  Confusion WDL  Danger to Self  Current suicidal ideation? Denies  Danger to Others  Danger to Others None reported or observed

## 2019-07-07 NOTE — [facility] Group Notes (Signed)
LCSW Group Therapy Note  07/07/2019   10:00-11:00am   Type of Therapy and Topic:  Group Therapy: Anger Cues and Responses  Participation Level:  Active   Description of Group:   In this group, patients learned how to recognize the physical, cognitive, emotional, and behavioral responses they have to anger-provoking situations.  They identified a recent time they became angry and how they reacted.  They analyzed how their reaction was possibly beneficial and how it was possibly unhelpful.  The group discussed a variety of healthier coping skills that could help with such a situation in the future.  Focus was placed on how helpful it is to recognize the underlying emotions to our anger, because working on those can lead to a more permanent solution as well as our ability to focus on the important rather than the urgent.  Therapeutic Goals: 1. Patients will remember their last incident of anger and how they felt emotionally and physically, what their thoughts were at the time, and how they behaved. 2. Patients will identify how their behavior at that time worked for them, as well as how it worked against them. 3. Patients will explore possible new behaviors to use in future anger situations. 4. Patients will learn that anger itself is normal and cannot be eliminated, and that healthier reactions can assist with resolving conflict rather than worsening situations.  Summary of Patient Progress:  The patient shared that her most recent time of anger was she blacked out  and said harsh words.She has little memory of the events but indicated she was not remorseful about the outcome. The patient recognizes that anger is a natural part of human life. That they can acquire effective coping skills and work toward having positive outcomes. Patient understands that there emotional and physical cues associated with anger and that these can be used as warning signs alert them to step-back, regroup and use a coping  skill. Patient encouraged to work on managing anger more effectively.  Therapeutic Modalities:   Cognitive Behavioral Therapy  Evorn Gong

## 2019-07-08 MED ORDER — HYDROXYZINE HCL 25 MG PO TABS
25.0000 mg | ORAL_TABLET | Freq: Three times a day (TID) | ORAL | Status: DC | PRN
  Administered 2019-07-08 – 2019-07-09 (×2): 25 mg via ORAL
  Filled 2019-07-08 (×2): qty 1

## 2019-07-08 NOTE — Progress Notes (Signed)
   07/08/19 2248  COVID-19 Daily Checkoff  Have you had a fever (temp > 37.80C/100F)  in the past 24 hours?  No  If you have had runny nose, nasal congestion, sneezing in the past 24 hours, has it worsened? No  COVID-19 EXPOSURE  Have you traveled outside the state in the past 14 days? No  Have you been in contact with someone with a confirmed diagnosis of COVID-19 or PUI in the past 14 days without wearing appropriate PPE? No  Have you been living in the same home as a person with confirmed diagnosis of COVID-19 or a PUI (household contact)? No  Have you been diagnosed with COVID-19? No

## 2019-07-08 NOTE — Progress Notes (Signed)
   07/08/19 2251  Psych Admission Type (Psych Patients Only)  Admission Status Voluntary  Psychosocial Assessment  Patient Complaints None  Eye Contact Fair  Facial Expression Anxious  Affect Appropriate to circumstance  Speech Logical/coherent  Interaction Assertive  Motor Activity Other (Comment) (WDL)  Appearance/Hygiene Unremarkable  Behavior Characteristics Appropriate to situation  Mood Pleasant  Thought Process  Coherency WDL  Content WDL  Delusions None reported or observed  Perception WDL  Hallucination None reported or observed  Judgment WDL  Confusion None  Danger to Self  Current suicidal ideation? Denies  Self-Injurious Behavior No self-injurious ideation or behavior indicators observed or expressed   Agreement Not to Harm Self Yes  Description of Agreement verbal contract for safety  Danger to Others  Danger to Others None reported or observed

## 2019-07-08 NOTE — Progress Notes (Signed)
   07/08/19 0023  COVID-19 Daily Checkoff  Have you had a fever (temp > 37.80C/100F)  in the past 24 hours?  No  If you have had runny nose, nasal congestion, sneezing in the past 24 hours, has it worsened? No  COVID-19 EXPOSURE  Have you traveled outside the state in the past 14 days? No  Have you been in contact with someone with a confirmed diagnosis of COVID-19 or PUI in the past 14 days without wearing appropriate PPE? No  Have you been living in the same home as a person with confirmed diagnosis of COVID-19 or a PUI (household contact)? No  Have you been diagnosed with COVID-19? No

## 2019-07-08 NOTE — Plan of Care (Signed)
Progress note  D: pt found in bed; compliant with medication administration. Pt denies any anxiety/depression/hopelessness. Pt does have complaints of a headache. Pt is pleasant and waiting on discharge scheduled for tomorrow. Pt denies si/hi/ah/vh and verbally agrees to approach staff if these become apparent or before harming themself/others while at [facility].  A: Pt provided support and encouragement. Pt given medication per protocol and standing orders. Q30m safety checks implemented and continued.  R: Pt safe on the unit. Will continue to monitor.  Pt progressing in the following metrics  Problem: Education: Goal: Knowledge of [facility] Education information/materials will improve Outcome: Progressing Goal: Emotional status will improve Outcome: Progressing Goal: Mental status will improve Outcome: Progressing Goal: Verbalization of understanding the information provided will improve Outcome: Progressing

## 2019-07-08 NOTE — Progress Notes (Signed)
   07/08/19 0026  Psych Admission Type (Psych Patients Only)  Admission Status Voluntary  Psychosocial Assessment  Patient Complaints Anxiety  Eye Contact Fair  Facial Expression Flat  Affect Appropriate to circumstance  Speech Logical/coherent  Interaction Assertive  Motor Activity Other (Comment) (WDL)  Appearance/Hygiene Unremarkable  Behavior Characteristics Appropriate to situation  Mood Pleasant;Anxious  Thought Process  Coherency WDL  Content WDL  Delusions None reported or observed  Perception WDL  Hallucination None reported or observed  Judgment WDL  Confusion None  Danger to Self  Current suicidal ideation? Denies  Self-Injurious Behavior No self-injurious ideation or behavior indicators observed or expressed   Agreement Not to Harm Self Yes  Description of Agreement verbal contract for safety  Danger to Others  Danger to Others None reported or observed

## 2019-07-08 NOTE — Progress Notes (Addendum)
[facility] MD Progress Note  07/08/2019 2:37 PM [name]  MRN:  [id number]   Subjective: Patient reports partial improvement.  Currently denies suicidal ideations.  Presents future oriented, focusing more on discharge.  Reports she had a migraine this morning which has now subsided.  Currently denies medication side effects.  Objective:I have reviewed chart notes  and have met with patient. 34 year old female, presented to ED on 3/12 following an overdose on Tylenol PM.  Of note, admission acetaminophen  serum level less than 10.  Reported at the time she wanted to "go to sleep and not wake up".  She reported significant psychosocial stressors: CPS took custody of her children.  She states that in order to regain custody she needs to take a series of classes and demonstrate she has a place of her own (currently lives with boyfriend).  She has a history of prior psychiatric admissions for depression.  Reported use of "ecstasy" prior to admission but denies regular use or pattern of dependence.  Patient is presenting with gradually improving mood and range of affect, although remains vaguely anxious.  She states she is feeling better.  She denies any suicidal ideations and presents future oriented, she is expressing optimism that she will eventually be able to regain custody of her children.  Currently tolerating medications well.  However, BP has trended low (today 103/68, pulse 79) and does state she has some mild dizziness/lightheadedness when first standing up in the morning. Visible on unit, no disruptive or agitated behaviors, polite on approach.    Principal Problem: Severe episode of recurrent major depressive disorder, with psychotic features ([place]) Diagnosis: Principal Problem:   Severe episode of recurrent major depressive disorder, with psychotic features ([place]) Active Problems:   Severe recurrent major depression without psychotic features ([place])  Total Time spent with patient: 15  minutes  Past Psychiatric History: See admission H&P  Past Medical History:  Past Medical History:  Diagnosis Date  . Cholecystitis 08/18/2011  . Headache(784.0)    otc med prn  . History of recurrent UTIs    recurrent UTIs  . Hypertension   . Hyperthyroidism   . Joint pain    knees and shoulders - otc med prn  . Sickle cell trait ([place])   . Sickle cell trait ([place])   . SVD (spontaneous vaginal delivery)    x 3 WH    Past Surgical History:  Procedure Laterality Date  . CHOLECYSTECTOMY  08/19/2011   Procedure: LAPAROSCOPIC CHOLECYSTECTOMY WITH INTRAOPERATIVE CHOLANGIOGRAM;  Surgeon: Madilyn Hook, DO;  Location: [facility];  Service: General;  Laterality: N/A;  . DILATION AND CURETTAGE OF UTERUS     molar preg  . LAPAROSCOPIC BILATERAL SALPINGECTOMY Bilateral 05/09/2013   Procedure: LAPAROSCOPIC BILATERAL SALPINGECTOMY;  Surgeon: Emily Filbert, MD;  Location: [facility];  Service: Gynecology;  Laterality: Bilateral;   Family History:  Family History  Problem Relation Age of Onset  . Diabetes Mother   . Cancer Father   . Diabetes Maternal Uncle   . Cancer Maternal Grandmother   . Cancer Maternal Grandfather   . Hypertension Maternal Grandfather   . Diabetes Maternal Grandfather   . Sickle cell anemia Son   . Asthma Son   . Heart disease Paternal Grandfather   . Hearing loss Paternal Aunt    Family Psychiatric  History: See admission H&P Social History:  Social History   Substance and Sexual Activity  Alcohol Use No     Social History   Substance and Sexual  Activity  Drug Use No    Social History   Socioeconomic History  . Marital status: Single    Spouse name: Not on file  . Number of children: Not on file  . Years of education: Not on file  . Highest education level: Not on file  Occupational History  . Not on file  Tobacco Use  . Smoking status: Never Smoker  . Smokeless tobacco: Never Used  Substance and Sexual Activity  . Alcohol use: No  . Drug use: No  .  Sexual activity: Yes    Birth control/protection: Surgical  Other Topics Concern  . Not on file  Social History Narrative   2 Adults, 4 children at home   FOB involved   Social Determinants of Health   Financial Resource Strain:   . Difficulty of Paying Living Expenses:   Food Insecurity:   . Worried About Charity fundraiser in the Last Year:   . Arboriculturist in the Last Year:   Transportation Needs:   . Film/video editor (Medical):   Marland Kitchen Lack of Transportation (Non-Medical):   Physical Activity:   . Days of Exercise per Week:   . Minutes of Exercise per Session:   Stress:   . Feeling of Stress :   Social Connections:   . Frequency of Communication with Friends and Family:   . Frequency of Social Gatherings with Friends and Family:   . Attends Religious Services:   . Active Member of Clubs or Organizations:   . Attends Archivist Meetings:   Marland Kitchen Marital Status:    Additional Social History:   Sleep: good   Appetite:Improving  Current Medications: Current Facility-Administered Medications  Medication Dose Route Frequency Provider Last Rate Last Admin  . acetaminophen (TYLENOL) tablet 650 mg  650 mg Oral Q6H PRN Lindon Romp A, NP   650 mg at 07/08/19 1107  . alum & mag hydroxide-simeth (MAALOX/MYLANTA) 200-200-20 MG/5ML suspension 30 mL  30 mL Oral Q4H PRN Lindon Romp A, NP      . citalopram (CELEXA) tablet 20 mg  20 mg Oral Daily , Myer Peer, MD   20 mg at 07/08/19 0903  . cyclobenzaprine (FLEXERIL) tablet 5 mg  5 mg Oral TID PRN Sharma Covert, MD   5 mg at 07/07/19 2101  . hydrOXYzine (ATARAX/VISTARIL) tablet 50 mg  50 mg Oral Q8H PRN Lindon Romp A, NP   50 mg at 07/07/19 2101  . magnesium hydroxide (MILK OF MAGNESIA) suspension 30 mL  30 mL Oral Daily PRN Lindon Romp A, NP      . methimazole (TAPAZOLE) tablet 20 mg  20 mg Oral BID Lindon Romp A, NP   20 mg at 07/08/19 0903  . metoprolol tartrate (LOPRESSOR) tablet 12.5 mg  12.5 mg Oral  BID Lindon Romp A, NP   12.5 mg at 07/08/19 0903  . pantoprazole (PROTONIX) EC tablet 40 mg  40 mg Oral Daily Sharma Covert, MD   40 mg at 07/08/19 1700  . prazosin (MINIPRESS) capsule 1 mg  1 mg Oral QHS Nwoko, Agnes I, NP   1 mg at 07/07/19 2101  . QUEtiapine (SEROQUEL) tablet 300 mg  300 mg Oral QHS Sharma Covert, MD   300 mg at 07/07/19 2101    Lab Results:  No results found for this or any previous visit (from the past 48 hour(s)).  Blood Alcohol level:  Lab Results  Component Value Date   ETH <  10 06/29/2019   ETH <5 81/04/7508    Metabolic Disorder Labs: Lab Results  Component Value Date   HGBA1C 5.0 07/01/2019   MPG 96.8 07/01/2019   MPG 94 01/09/2016   Lab Results  Component Value Date   PROLACTIN 35.2 (H) 01/09/2016   Lab Results  Component Value Date   CHOL 133 07/01/2019   TRIG 70 07/01/2019   HDL 31 (L) 07/01/2019   CHOLHDL 4.3 07/01/2019   VLDL 14 07/01/2019   LDLCALC 88 07/01/2019   LDLCALC 68 01/09/2016    Physical Findings: AIMS: Facial and Oral Movements Muscles of Facial Expression: None, normal Lips and Perioral Area: None, normal Jaw: None, normal Tongue: None, normal,Extremity Movements Upper (arms, wrists, hands, fingers): None, normal Lower (legs, knees, ankles, toes): None, normal, Trunk Movements Neck, shoulders, hips: None, normal, Overall Severity Severity of abnormal movements (highest score from questions above): None, normal Incapacitation due to abnormal movements: None, normal Patient's awareness of abnormal movements (rate only patient's report): No Awareness, Dental Status Current problems with teeth and/or dentures?: No Does patient usually wear dentures?: No  CIWA:  CIWA-Ar Total: 0 COWS:     Musculoskeletal: Strength & Muscle Tone: within normal limits Gait & Station: normal Patient leans: N/A  Psychiatric Specialty Exam: Physical Exam  Nursing note and vitals reviewed. Constitutional: She is oriented to  person, place, and time. She appears well-developed and well-nourished.  HENT:  Head: Normocephalic and atraumatic.  Cardiovascular: Normal rate.  Respiratory: Effort normal.  Musculoskeletal:        General: Normal range of motion.  Neurological: She is alert and oriented to person, place, and time.  Skin: Skin is warm and dry.  Psychiatric: Her mood appears anxious. She is not withdrawn. Thought content is not paranoid and not delusional. She exhibits a depressed mood. She expresses no homicidal and no suicidal ideation.    Review of Systems  Constitutional: Negative for activity change, chills, diaphoresis, fatigue, fever and unexpected weight change. Appetite change: Improving.  HENT: Negative for congestion and sore throat.   Respiratory: Negative for cough and shortness of breath.   Cardiovascular: Negative for chest pain and palpitations.  Gastrointestinal: Negative for diarrhea, nausea and vomiting.  Musculoskeletal: Negative.   Neurological: Negative for dizziness and headaches.  Psychiatric/Behavioral: Positive for dysphoric mood, hallucinations, sleep disturbance and suicidal ideas. The patient is nervous/anxious.   All other systems reviewed and are negative. Mild dizziness/lightheadedness this a.m.  Reports headache this a.m. which is now improved.  No chest pain, no shortness of breath, no vomiting  Blood pressure 103/68, pulse 79, temperature 98.1 F (36.7 C), temperature source Oral, resp. rate 18, height 5' 4.25" (1.632 m), weight 61.2 kg, SpO2 95 %.Body mass index is 22.99 kg/m.  General Appearance: Casual  Eye Contact:  improving   Speech:  Normal Rate  Volume:  Normal  Mood:  Describes mood as "getting better"  Affect:  Improving, more reactive, vaguely anxious  Thought Process:  Linear and Descriptions of Associations: Intact  Orientation:  Full (Time, Place, and Person)  Thought Content:  No delusions, not internally preoccupied.  Suicidal Thoughts:  No today  denies suicidal or self injurious ideations and contracts for safety on unit   Homicidal Thoughts:  No  Memory:  Recent and remote grossly intact  Judgement:  Other:  improved   Insight:  improving   Psychomotor Activity:  Normal-no psychomotor agitation or restlessness  Concentration:  Concentration: Good and Attention Span: Good  Recall:  Good  Fund of Knowledge:  Good  Language:  Good  Akathisia:  Negative  Handed:  Right  AIMS (if indicated):     Assets:  Desire for Improvement Resilience  ADL's:  Intact  Cognition:  WNL  Sleep:  Number of Hours: 6.75    Assessment -  34 year old female, presented to ED on 3/12 following an overdose on Tylenol PM.  Of note, admission acetaminophen  serum level less than 10.  Reported at the time she wanted to "go to sleep and not wake up".  She reported significant psychosocial stressors: CPS took custody of her children.  She states that in order to regain custody she needs to take a series of classes and demonstrate she has a place of her own (currently lives with boyfriend).  She has a history of prior psychiatric admissions for depression.  Reported use of "ecstasy" prior to admission but denies regular use or pattern of dependence.  Patient is presenting with a gradually improving mood and affect.  Currently denies suicidal ideations and presents future oriented.  Although denies medication side effects does endorse slight lightheadedness and a headache this a.m. (now resolved).  We reviewed options and will discontinue Minipress at this time as BP has trended slightly low (105/63 today, 94/61 yesterday)   Treatment Plan Summary: Treatment Plan reviewed as below today 3/21 Continue Celexa 20 mg daily for depression Continue Seroquel 300 mg nightly for mood disorder/insomnia D/C Minipress  Continue Lopressor 12.5 mg twice daily for hypertension Continue Tapazole 20 mg twice daily for history of hyperthyroidism Check Vitals ( sitting,standing  ) Q 8 hours  Treatment team working on disposition planning options  Jenne Campus, MD 07/08/2019, 2:37 PM   Patient ID: Grace [name], female   DOB: 11/10/1985, 34 y.o.   MRN: [id number]

## 2019-07-09 MED ORDER — PANTOPRAZOLE SODIUM 40 MG PO TBEC: 40.0000 mg | DELAYED_RELEASE_TABLET | Freq: Every day | ORAL | 0 refills | Status: DC

## 2019-07-09 MED ORDER — QUETIAPINE FUMARATE 300 MG PO TABS: 300.0000 mg | ORAL_TABLET | Freq: Every day | ORAL | 0 refills | Status: DC

## 2019-07-09 MED ORDER — METHIMAZOLE 10 MG PO TABS: 20.0000 mg | ORAL_TABLET | Freq: Two times a day (BID) | ORAL | 0 refills | Status: DC

## 2019-07-09 MED ORDER — HYDROXYZINE HCL 25 MG PO TABS: 25.0000 mg | ORAL_TABLET | Freq: Three times a day (TID) | ORAL | 0 refills | Status: DC | PRN

## 2019-07-09 MED ORDER — CITALOPRAM HYDROBROMIDE 20 MG PO TABS: 20.0000 mg | ORAL_TABLET | Freq: Every day | ORAL | 0 refills | Status: DC

## 2019-07-09 MED ORDER — METOPROLOL TARTRATE 25 MG PO TABS: 12.5000 mg | ORAL_TABLET | Freq: Two times a day (BID) | ORAL | 0 refills | Status: DC
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
## 2019-07-09 NOTE — Progress Notes (Signed)
Pt discharged to lobby. Pt was stable and appreciative at that time. All papers and prescriptions were given and valuables returned. Verbal understanding expressed. Denies SI/HI and A/VH. Pt given opportunity to express concerns and ask questions.  

## 2019-07-09 NOTE — Progress Notes (Signed)
  [facility] Adult Case Management Discharge Plan :  Will you be returning to the same living situation after discharge:  Yes,  patient returning home with her boyfriend At discharge, do you have transportation home?: Yes,  patient's boyfriend is picking her up Do you have the ability to pay for your medications: Yes,  Medicaid  Release of information consent forms completed and in the chart;  Patient's signature needed at discharge.  Patient to Follow up at: Follow-up Information    [facility] [place] [employer]. Go to.   Specialty: Professional Counselor Why: You are not currently established with this provider.  Please go to this provider during their walk in hours: 8:30a-12:00p and 1:00p-2:30p Monday through Friday. Be sure to bring your Photo ID, insurance information and any discharge paperwork.  Contact information: [facility] [place] [address] [place] [address] [phone]           Next level of care provider has access to [facility] Link:yes  Safety Planning and Suicide Prevention discussed: Yes,  with the patient     Has patient been referred to the Quitline?: N/A patient is not a smoker  Patient has been referred for addiction treatment: N/A  Maeola Sarah, LCSWA 07/09/2019, 10:43 AM
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
## 2020-03-30 ENCOUNTER — Ambulatory Visit (INDEPENDENT_AMBULATORY_CARE_PROVIDER_SITE_OTHER): Payer: Medicaid Other

## 2020-03-30 ENCOUNTER — Encounter (HOSPITAL_COMMUNITY): Payer: Self-pay | Admitting: *Deleted

## 2020-03-30 ENCOUNTER — Other Ambulatory Visit: Payer: Self-pay

## 2020-03-30 ENCOUNTER — Ambulatory Visit (HOSPITAL_COMMUNITY)
Admission: EM | Admit: 2020-03-30 | Discharge: 2020-03-30 | Disposition: A | Discharge status: Home / Self Care | Payer: Medicaid Other | Attending: Family Medicine | Admitting: Family Medicine

## 2020-03-30 DIAGNOSIS — M25511 Pain in right shoulder: Secondary | ICD-10-CM

## 2020-03-30 DIAGNOSIS — N644 Mastodynia: Secondary | ICD-10-CM | POA: Diagnosis not present

## 2020-03-30 DIAGNOSIS — R591 Generalized enlarged lymph nodes: Secondary | ICD-10-CM

## 2020-03-30 MED ORDER — IBUPROFEN 600 MG PO TABS: 600.0000 mg | ORAL_TABLET | Freq: Three times a day (TID) | ORAL | 0 refills | Status: DC | PRN

## 2020-03-30 MED ORDER — AMOXICILLIN-POT CLAVULANATE 875-125 MG PO TABS: 1.0000 | ORAL_TABLET | Freq: Two times a day (BID) | ORAL | 0 refills | Status: DC

## 2020-03-30 NOTE — ED Provider Notes (Signed)
Grace [name]    CSN: [id number] Arrival date & time: 03/30/20  1615      History   Chief Complaint Chief Complaint  Patient presents with  . Breast Pain  . Shoulder Pain    HPI Grace [name] is a 34 y.o. female.   Pt is a 34 year old female that presents with right shoulder pain, breast pain. This has been present x 1 month. Feels like its getting worse. Palpable knot in the right axilla. Decreased ROM of the right shoulder. No fever. Has felt increased warmth to the area. No heavy lifting, injuries, falls. No nipple discharge.  No fever, chills, body aches, night sweats.     Past Medical History:  Diagnosis Date  . Cholecystitis 08/18/2011  . Headache(784.0)    otc med prn  . History of recurrent UTIs    recurrent UTIs  . Hypertension   . Hyperthyroidism   . Joint pain    knees and shoulders - otc med prn  . Sickle cell trait (HCC)   . Sickle cell trait (HCC)   . SVD (spontaneous vaginal delivery)    x 3 [facility]    Patient Active Problem List   Diagnosis Date Noted  . Severe recurrent major depression without psychotic features (HCC) 06/30/2019  . Major depressive disorder, single episode, severe without psychotic features (HCC) 01/05/2016  . Severe episode of recurrent major depressive disorder, with psychotic features (HCC)   . MDD (major depressive disorder), recurrent episode, severe (HCC) 04/15/2015  . Hyperthyroidism 03/21/2015  . Tachycardia 03/21/2015  . Palpitations 03/21/2015  . Chest pain 03/21/2015  . MDD (major depressive disorder), recurrent episode, moderate (HCC) 03/16/2015  . Consultation for female sterilization 05/09/2013  . NSVD (normal spontaneous vaginal delivery) 03/30/2013  . Chronic hypertension in pregnancy 03/29/2013  . HTN in pregnancy, chronic 03/28/2013  . Transient hypertension of pregnancy, antepartum 03/20/2013  . GERD (gastroesophageal reflux disease) 01/18/2013  . Hyperemesis gravidarum before end of [redacted] week  gestation with electrolyte imbalance 09/23/2012  . Trichomonas 09/23/2012  . Supervision of high-risk pregnancy 04/19/2011  . History of molar pregnancy, antepartum 03/22/2011  . Hyperthyroidism, antepartum 03/16/2011  . Sickle cell trait (HCC) 12/30/2010    Past Surgical History:  Procedure Laterality Date  . CHOLECYSTECTOMY  08/19/2011   Procedure: LAPAROSCOPIC CHOLECYSTECTOMY WITH INTRAOPERATIVE CHOLANGIOGRAM;  Surgeon: Lodema Pilot, DO;  Location: [facility];  Service: General;  Laterality: N/A;  . DILATION AND CURETTAGE OF UTERUS     molar preg  . LAPAROSCOPIC BILATERAL SALPINGECTOMY Bilateral 05/09/2013   Procedure: LAPAROSCOPIC BILATERAL SALPINGECTOMY;  Surgeon: Allie Bossier, MD;  Location: [facility];  Service: Gynecology;  Laterality: Bilateral;    OB History    Gravida  4   Para  3   Term  3   Preterm      AB  1   Living  3     SAB  1   IAB      Ectopic      Multiple      Live Births  3            Home Medications    Prior to Admission medications   Medication Sig Start Date End Date Taking? Authorizing Provider  citalopram (CELEXA) 20 MG tablet Take 1 tablet (20 mg total) by mouth daily. 07/10/19  Yes Aldean Baker, NP  hydrOXYzine (ATARAX/VISTARIL) 25 MG tablet Take 1 tablet (25 mg total) by mouth every 8 (eight) hours as needed for  anxiety. 07/09/19  Yes Aldean Baker, NP  methimazole (TAPAZOLE) 10 MG tablet Take 2 tablets (20 mg total) by mouth 2 (two) times daily. 07/09/19  Yes Aldean Baker, NP  metoprolol tartrate (LOPRESSOR) 25 MG tablet Take 0.5 tablets (12.5 mg total) by mouth 2 (two) times daily. 07/09/19  Yes Aldean Baker, NP  pantoprazole (PROTONIX) 40 MG tablet Take 1 tablet (40 mg total) by mouth daily. 07/10/19  Yes Aldean Baker, NP  QUEtiapine (SEROQUEL) 300 MG tablet Take 1 tablet (300 mg total) by mouth at bedtime. 07/09/19  Yes Aldean Baker, NP  amoxicillin-clavulanate (AUGMENTIN) 875-125 MG tablet Take 1 tablet by mouth every 12 (twelve)  hours. 03/30/20   Dahlia Byes A, NP  ibuprofen (ADVIL) 600 MG tablet Take 1 tablet (600 mg total) by mouth every 8 (eight) hours as needed for moderate pain. 03/30/20   Janace Aris, NP    Family History Family History  Problem Relation Age of Onset  . Diabetes Mother   . Cancer Father   . Diabetes Maternal Uncle   . Cancer Maternal Grandmother   . Cancer Maternal Grandfather   . Hypertension Maternal Grandfather   . Diabetes Maternal Grandfather   . Sickle cell anemia Son   . Asthma Son   . Heart disease Paternal Grandfather   . Hearing loss Paternal Aunt     Social History Social History   Tobacco Use  . Smoking status: Never Smoker  . Smokeless tobacco: Never Used  Vaping Use  . Vaping Use: Never used  Substance Use Topics  . Alcohol use: No  . Drug use: Yes    Types: Marijuana    Comment: occasional     Allergies   Patient has no known allergies.   Review of Systems Review of Systems   Physical Exam Triage Vital Signs ED Triage Vitals  Enc Vitals Group     BP 03/30/20 1635 (!) 138/94     Pulse Rate 03/30/20 1635 (!) 106     Resp 03/30/20 1635 (!) 22     Temp 03/30/20 1635 97.8 F (36.6 C)     Temp Source 03/30/20 1635 Oral     SpO2 03/30/20 1635 100 %     Weight --      Height --      Head Circumference --      Peak Flow --      Pain Score 03/30/20 1636 4     Pain Loc --      Pain Edu? --      Excl. in GC? --    No data found.  Updated Vital Signs BP (!) 138/94   Pulse (!) 106   Temp 97.8 F (36.6 C) (Oral)   Resp (!) 22   LMP 03/12/2020 (Exact Date)   SpO2 100%   Visual Acuity Right Eye Distance:   Left Eye Distance:   Bilateral Distance:    Right Eye Near:   Left Eye Near:    Bilateral Near:     Physical Exam Vitals and nursing note reviewed.  Constitutional:      General: She is not in acute distress.    Appearance: Normal appearance. She is not ill-appearing, toxic-appearing or diaphoretic.  HENT:     Head:  Normocephalic.     Nose: Nose normal.  Eyes:     Conjunctiva/sclera: Conjunctivae normal.  Cardiovascular:     Rate and Rhythm: Normal rate and regular rhythm.  Pulmonary:  Effort: Pulmonary effort is normal.     Breath sounds: Normal breath sounds.  Chest:  Breasts:     Right: Tenderness and axillary adenopathy present. No swelling, bleeding, inverted nipple, mass, nipple discharge or skin change.     Musculoskeletal:     Right shoulder: Tenderness present. No swelling, deformity, effusion or laceration. Decreased range of motion.       Arms:     Cervical back: Normal range of motion.  Lymphadenopathy:     Upper Body:     Right upper body: Axillary adenopathy present.  Skin:    General: Skin is warm and dry.     Findings: No rash.  Neurological:     Mental Status: She is alert.  Psychiatric:        Mood and Affect: Mood normal.      UC Treatments / Results  Labs (all labs ordered are listed, but only abnormal results are displayed) Labs Reviewed - No data to display  EKG   Radiology DG Shoulder Right  Result Date: 03/30/2020 CLINICAL DATA:  Right shoulder pain. EXAM: RIGHT SHOULDER - 2+ VIEW COMPARISON:  None. FINDINGS: There is no evidence of fracture or dislocation. There is no evidence of arthropathy or other focal bone abnormality. Soft tissues are unremarkable. IMPRESSION: Negative. Electronically Signed   By: Emmaline Kluver M.D.   On: 03/30/2020 17:24    Procedures Procedures (including critical care time)  Medications Ordered in UC Medications - No data to display  Initial Impression / Assessment and Plan / UC Course  I have reviewed the triage vital signs and the nursing notes.  Pertinent labs & imaging results that were available during my care of the patient were reviewed by me and considered in my medical decision making (see chart for details).     Breast pain, shoulder pain, lymphadenopathy. X-ray without any acute concerns today.    Based on symptoms and exam we will go ahead and cover for underlying abscess or infection.  Treating with Augmentin Unsure if the shoulder pain, breast pain are connected or 2 separate issues. We will do ibuprofen 600 mg every 8 hours for pain, inflammation. I have ordered ultrasound mammogram of the breast. Follow up as needed for continued or worsening symptoms    Final Clinical Impressions(s) / UC Diagnoses   Final diagnoses:  Breast pain  Lymphadenopathy  Pain in joint of right shoulder     Discharge Instructions     Your shoulder x-ray did not show anything concerning. Based on the increased warmth and lymph node swelling to the underarm we will go ahead and cover for possible underlying infection at this time.  We will start you on antibiotics. I am also ordering outpatient ultrasound of the breast. They should be calling you tomorrow to set up an appointment. For any continued or worsening problems please follow-up.  You can do ibuprofen 600 mg every 8 hours for pain, inflammation.    ED Prescriptions    Medication Sig Dispense Auth. Provider   ibuprofen (ADVIL) 600 MG tablet Take 1 tablet (600 mg total) by mouth every 8 (eight) hours as needed for moderate pain. 30 tablet Reeya Bound A, NP   amoxicillin-clavulanate (AUGMENTIN) 875-125 MG tablet Take 1 tablet by mouth every 12 (twelve) hours. 14 tablet Josee Speece A, NP     PDMP not reviewed this encounter.   Janace Aris, NP 03/31/20 [phone]

## 2020-03-30 NOTE — ED Triage Notes (Signed)
C/O intermittent right shoulder pains (painful ROM) x 1 month.  Also c/o intermittent tenderness in right breast.  States unsure if any lumps palpable in right breast because it's to tender to check at times.

## 2020-03-30 NOTE — Discharge Instructions (Signed)
Your shoulder x-ray did not show anything concerning. Based on the increased warmth and lymph node swelling to the underarm we will go ahead and cover for possible underlying infection at this time.  We will start you on antibiotics. I am also ordering outpatient ultrasound of the breast. They should be calling you tomorrow to set up an appointment. For any continued or worsening problems please follow-up.  You can do ibuprofen 600 mg every 8 hours for pain, inflammation.

## 2020-04-18 ENCOUNTER — Other Ambulatory Visit: Payer: Self-pay

## 2020-04-18 ENCOUNTER — Ambulatory Visit (HOSPITAL_COMMUNITY)
Admission: EM | Admit: 2020-04-18 | Discharge: 2020-04-18 | Disposition: A | Discharge status: Home / Self Care | Payer: Medicaid Other | Attending: Internal Medicine | Admitting: Internal Medicine

## 2020-04-18 ENCOUNTER — Encounter (HOSPITAL_COMMUNITY): Payer: Self-pay

## 2020-04-18 DIAGNOSIS — U071 COVID-19: Secondary | ICD-10-CM | POA: Diagnosis present

## 2020-04-18 LAB — SARS CORONAVIRUS 2 (TAT 6-24 HRS): SARS Coronavirus 2: POSITIVE — AB

## 2020-04-18 MED ORDER — IBUPROFEN 600 MG PO TABS: 600.0000 mg | ORAL_TABLET | Freq: Four times a day (QID) | ORAL | 0 refills | Status: DC | PRN

## 2020-04-18 MED ORDER — ONDANSETRON 4 MG PO TBDP: 4.0000 mg | ORAL_TABLET | Freq: Three times a day (TID) | ORAL | 0 refills | Status: DC | PRN

## 2020-04-18 MED ORDER — GUAIFENESIN ER 600 MG PO TB12: 600.0000 mg | ORAL_TABLET | Freq: Two times a day (BID) | ORAL | 0 refills | Status: AC

## 2020-04-18 MED ORDER — BENZONATATE 100 MG PO CAPS: 100.0000 mg | ORAL_CAPSULE | Freq: Three times a day (TID) | ORAL | 0 refills | Status: DC

## 2020-04-18 NOTE — ED Triage Notes (Signed)
Pt c/o productive cough with green sputum, congestion, HA onset Monday. Reports diminished smell/taste, onset yesterday.  Reports n/v/d onset two days ago. Last emesis two days ago.  Tolerating sips po fluids.  Denies SOB, fever, sore throat, ear pain. Had flu vaccine, pneumonia vaccines this year. No COVID vaccines or h/o covid infection.

## 2020-04-18 NOTE — Discharge Instructions (Addendum)
Please increase oral fluid intake Take medications as directed Please quarantine until COVID-19 test results are available If your symptoms worsen please return to urgent care to be reevaluated We will call you with recommendations if your lab results are abnormal.

## 2020-04-18 NOTE — ED Provider Notes (Addendum)
[facility]    CSN: [id number] Arrival date & time: 04/18/20  0825      History   Chief Complaint Chief Complaint  Patient presents with  . Cough    HPI Grace [name] is a 34 y.o. female comes to the urgent care with a 4-day history of cough, headache and general malaise.  Patient says cough was initially nonproductive but has become productive of greenish sputum.  She denies any shortness of breath or wheezing.  She notices diminished sense of smell and taste yesterday.  She has had some diarrhea with nausea but no vomiting.  Oral intake is decreased.  She works at [employer].  No known sick contacts.  Masking is however not strictly enforced at her workplace.  Patient is not vaccinated against COVID-19 virus.  She has not received flu vaccine this year.  HPI  Past Medical History:  Diagnosis Date  . Cholecystitis 08/18/2011  . Headache(784.0)    otc med prn  . History of recurrent UTIs    recurrent UTIs  . Hypertension   . Hyperthyroidism   . Joint pain    knees and shoulders - otc med prn  . Sickle cell trait (HCC)   . Sickle cell trait (HCC)   . SVD (spontaneous vaginal delivery)    x 3 [facility]    Patient Active Problem List   Diagnosis Date Noted  . Severe recurrent major depression without psychotic features (HCC) 06/30/2019  . Major depressive disorder, single episode, severe without psychotic features (HCC) 01/05/2016  . Severe episode of recurrent major depressive disorder, with psychotic features (HCC)   . MDD (major depressive disorder), recurrent episode, severe (HCC) 04/15/2015  . Hyperthyroidism 03/21/2015  . Tachycardia 03/21/2015  . Palpitations 03/21/2015  . Chest pain 03/21/2015  . MDD (major depressive disorder), recurrent episode, moderate (HCC) 03/16/2015  . Consultation for female sterilization 05/09/2013  . NSVD (normal spontaneous vaginal delivery) 03/30/2013  . Chronic hypertension in pregnancy 03/29/2013  . HTN in pregnancy,  chronic 03/28/2013  . Transient hypertension of pregnancy, antepartum 03/20/2013  . GERD (gastroesophageal reflux disease) 01/18/2013  . Hyperemesis gravidarum before end of [redacted] week gestation with electrolyte imbalance 09/23/2012  . Trichomonas 09/23/2012  . Supervision of high-risk pregnancy 04/19/2011  . History of molar pregnancy, antepartum 03/22/2011  . Hyperthyroidism, antepartum 03/16/2011  . Sickle cell trait (HCC) 12/30/2010    Past Surgical History:  Procedure Laterality Date  . CHOLECYSTECTOMY  08/19/2011   Procedure: LAPAROSCOPIC CHOLECYSTECTOMY WITH INTRAOPERATIVE CHOLANGIOGRAM;  Surgeon: Lodema PilotBrian Layton, DO;  Location: [facility];  Service: General;  Laterality: N/A;  . DILATION AND CURETTAGE OF UTERUS     molar preg  . LAPAROSCOPIC BILATERAL SALPINGECTOMY Bilateral 05/09/2013   Procedure: LAPAROSCOPIC BILATERAL SALPINGECTOMY;  Surgeon: Allie BossierMyra C Dove, MD;  Location: [facility];  Service: Gynecology;  Laterality: Bilateral;    OB History    Gravida  4   Para  3   Term  3   Preterm      AB  1   Living  3     SAB  1   IAB      Ectopic      Multiple      Live Births  3            Home Medications    Prior to Admission medications   Medication Sig Start Date End Date Taking? Authorizing Provider  benzonatate (TESSALON) 100 MG capsule Take 1 capsule (100 mg total) by  mouth every 8 (eight) hours. 04/18/20  Yes Emarie Paul, Britta Mccreedy, MD  citalopram (CELEXA) 20 MG tablet Take 1 tablet (20 mg total) by mouth daily. 07/10/19  Yes Aldean Baker, NP  guaiFENesin (MUCINEX) 600 MG 12 hr tablet Take 1 tablet (600 mg total) by mouth 2 (two) times daily for 14 days. 04/18/20 05/02/20 Yes Carrigan Delafuente, Britta Mccreedy, MD  hydrOXYzine (ATARAX/VISTARIL) 25 MG tablet Take 1 tablet (25 mg total) by mouth every 8 (eight) hours as needed for anxiety. 07/09/19  Yes Aldean Baker, NP  ibuprofen (ADVIL) 600 MG tablet Take 1 tablet (600 mg total) by mouth every 6 (six) hours as needed. 04/18/20  Yes  Taleah Bellantoni, Britta Mccreedy, MD  metoprolol tartrate (LOPRESSOR) 25 MG tablet Take 0.5 tablets (12.5 mg total) by mouth 2 (two) times daily. 07/09/19  Yes Aldean Baker, NP  ondansetron (ZOFRAN ODT) 4 MG disintegrating tablet Take 1 tablet (4 mg total) by mouth every 8 (eight) hours as needed for nausea or vomiting. 04/18/20  Yes Jkayla Spiewak, Britta Mccreedy, MD  pantoprazole (PROTONIX) 40 MG tablet Take 1 tablet (40 mg total) by mouth daily. 07/10/19  Yes Aldean Baker, NP  QUEtiapine (SEROQUEL) 300 MG tablet Take 1 tablet (300 mg total) by mouth at bedtime. 07/09/19  Yes Aldean Baker, NP  amoxicillin-clavulanate (AUGMENTIN) 875-125 MG tablet Take 1 tablet by mouth every 12 (twelve) hours. 03/30/20   Dahlia Byes A, NP  methimazole (TAPAZOLE) 10 MG tablet Take 2 tablets (20 mg total) by mouth 2 (two) times daily. 07/09/19   Aldean Baker, NP    Family History Family History  Problem Relation Age of Onset  . Diabetes Mother   . Cancer Father   . Diabetes Maternal Uncle   . Cancer Maternal Grandmother   . Cancer Maternal Grandfather   . Hypertension Maternal Grandfather   . Diabetes Maternal Grandfather   . Sickle cell anemia Son   . Asthma Son   . Heart disease Paternal Grandfather   . Hearing loss Paternal Aunt     Social History Social History   Tobacco Use  . Smoking status: Never Smoker  . Smokeless tobacco: Never Used  Vaping Use  . Vaping Use: Never used  Substance Use Topics  . Alcohol use: No  . Drug use: Yes    Types: Marijuana    Comment: occasional     Allergies   Patient has no known allergies.   Review of Systems Review of Systems  Constitutional: Positive for activity change, appetite change, chills, fatigue and fever.  HENT: Positive for congestion and sore throat.   Respiratory: Positive for cough. Negative for shortness of breath and wheezing.   Gastrointestinal: Positive for abdominal pain, diarrhea and nausea. Negative for vomiting.  Neurological: Positive for  headaches.     Physical Exam Triage Vital Signs ED Triage Vitals  Enc Vitals Group     BP 04/18/20 0923 131/89     Pulse Rate 04/18/20 0923 90     Resp 04/18/20 0923 17     Temp 04/18/20 0923 98.1 F (36.7 C)     Temp Source 04/18/20 0923 Oral     SpO2 04/18/20 0923 100 %     Weight --      Height --      Head Circumference --      Peak Flow --      Pain Score 04/18/20 0921 7     Pain Loc --  Pain Edu? --      Excl. in GC? --    No data found.  Updated Vital Signs BP 131/89 (BP Location: Right Arm)   Pulse 90   Temp 98.1 F (36.7 C) (Oral)   Resp 17   LMP 04/07/2020   SpO2 100%   Visual Acuity Right Eye Distance:   Left Eye Distance:   Bilateral Distance:    Right Eye Near:   Left Eye Near:    Bilateral Near:     Physical Exam Vitals and nursing note reviewed.  Constitutional:      General: She is not in acute distress.    Appearance: She is ill-appearing.  HENT:     Mouth/Throat:     Pharynx: No posterior oropharyngeal erythema.  Cardiovascular:     Rate and Rhythm: Normal rate and regular rhythm.     Pulses: Normal pulses.     Heart sounds: Normal heart sounds.  Pulmonary:     Effort: Pulmonary effort is normal.     Breath sounds: Normal breath sounds.  Abdominal:     General: Abdomen is flat. Bowel sounds are normal.  Skin:    General: Skin is warm.     Capillary Refill: Capillary refill takes less than 2 seconds.  Neurological:     Mental Status: She is alert.      UC Treatments / Results  Labs (all labs ordered are listed, but only abnormal results are displayed) Labs Reviewed  SARS CORONAVIRUS 2 (TAT 6-24 HRS)    EKG   Radiology No results found.  Procedures Procedures (including critical care time)  Medications Ordered in UC Medications - No data to display  Initial Impression / Assessment and Plan / UC Course  I have reviewed the triage vital signs and the nursing notes.  Pertinent labs & imaging results that were  available during my care of the patient were reviewed by me and considered in my medical decision making (see chart for details).     1.  COVID-19 infection: COVID-19 PCR test sent Zofran as needed for nausea/vomiting Mucinex, Tessalon Perles and ibuprofen prescribed for symptom management Patient is advised to isolate until COVID-19 test results are available Increase oral fluid intake Return precautions given. Final Clinical Impressions(s) / UC Diagnoses   Final diagnoses:  COVID-19 virus infection     Discharge Instructions     Please increase oral fluid intake Take medications as directed Please quarantine until COVID-19 test results are available If your symptoms worsen please return to urgent care to be reevaluated We will call you with recommendations if your lab results are abnormal.   ED Prescriptions    Medication Sig Dispense Auth. Provider   benzonatate (TESSALON) 100 MG capsule Take 1 capsule (100 mg total) by mouth every 8 (eight) hours. 21 capsule Malyna Budney, Britta Mccreedy, MD   guaiFENesin (MUCINEX) 600 MG 12 hr tablet Take 1 tablet (600 mg total) by mouth 2 (two) times daily for 14 days. 28 tablet Ineta Sinning, Britta Mccreedy, MD   ibuprofen (ADVIL) 600 MG tablet Take 1 tablet (600 mg total) by mouth every 6 (six) hours as needed. 30 tablet Deni Lefever, Britta Mccreedy, MD   ondansetron (ZOFRAN ODT) 4 MG disintegrating tablet Take 1 tablet (4 mg total) by mouth every 8 (eight) hours as needed for nausea or vomiting. 20 tablet Ovie Cornelio, Britta Mccreedy, MD     PDMP not reviewed this encounter.   Merrilee Jansky, MD 04/18/20 1054    Kadia Abaya, Britta Mccreedy,  MD 04/18/20 1055

## 2020-06-06 ENCOUNTER — Ambulatory Visit (INDEPENDENT_AMBULATORY_CARE_PROVIDER_SITE_OTHER): Payer: Medicaid Other

## 2020-06-06 ENCOUNTER — Encounter (HOSPITAL_COMMUNITY): Payer: Self-pay

## 2020-06-06 ENCOUNTER — Ambulatory Visit (HOSPITAL_COMMUNITY)
Admission: EM | Admit: 2020-06-06 | Discharge: 2020-06-06 | Disposition: A | Discharge status: Home / Self Care | Payer: Medicaid Other | Attending: Physician Assistant | Admitting: Physician Assistant

## 2020-06-06 ENCOUNTER — Other Ambulatory Visit: Payer: Self-pay

## 2020-06-06 DIAGNOSIS — R079 Chest pain, unspecified: Secondary | ICD-10-CM | POA: Diagnosis not present

## 2020-06-06 DIAGNOSIS — Z8616 Personal history of COVID-19: Secondary | ICD-10-CM

## 2020-06-06 NOTE — Discharge Instructions (Addendum)
Your xray shows a lot of gas on the left side of your abdomen.  Try taking GASX to see if this releaves symptoms

## 2020-06-06 NOTE — ED Provider Notes (Signed)
[facility]    CSN: [id number] Arrival date & time: 06/06/20  0809      History   Chief Complaint Chief Complaint  Patient presents with  . Chest Pain    HPI Grace [name] is a 35 y.o. female.   The history is provided by the patient. No language interpreter was used.  Chest Pain Pain location:  L chest Pain quality: aching   Pain radiates to:  Does not radiate Pain severity:  Moderate Onset quality:  Gradual Timing:  Constant Progression:  Worsening Chronicity:  New Relieved by:  Nothing Ineffective treatments:  None tried Associated symptoms: abdominal pain and nausea   Associated symptoms: no cough   Pt complains of discimfort in her left chest.  Pt reports she has had for over a week,  Past Medical History:  Diagnosis Date  . Cholecystitis 08/18/2011  . Headache(784.0)    otc med prn  . History of recurrent UTIs    recurrent UTIs  . Hypertension   . Hyperthyroidism   . Joint pain    knees and shoulders - otc med prn  . Sickle cell trait (HCC)   . Sickle cell trait (HCC)   . SVD (spontaneous vaginal delivery)    x 3 [facility]    Patient Active Problem List   Diagnosis Date Noted  . Severe recurrent major depression without psychotic features (HCC) 06/30/2019  . Major depressive disorder, single episode, severe without psychotic features (HCC) 01/05/2016  . Severe episode of recurrent major depressive disorder, with psychotic features (HCC)   . MDD (major depressive disorder), recurrent episode, severe (HCC) 04/15/2015  . Hyperthyroidism 03/21/2015  . Tachycardia 03/21/2015  . Palpitations 03/21/2015  . Chest pain 03/21/2015  . MDD (major depressive disorder), recurrent episode, moderate (HCC) 03/16/2015  . Consultation for female sterilization 05/09/2013  . NSVD (normal spontaneous vaginal delivery) 03/30/2013  . Chronic hypertension in pregnancy 03/29/2013  . HTN in pregnancy, chronic 03/28/2013  . Transient hypertension of pregnancy,  antepartum 03/20/2013  . GERD (gastroesophageal reflux disease) 01/18/2013  . Hyperemesis gravidarum before end of [redacted] week gestation with electrolyte imbalance 09/23/2012  . Trichomonas 09/23/2012  . Supervision of high-risk pregnancy 04/19/2011  . History of molar pregnancy, antepartum 03/22/2011  . Hyperthyroidism, antepartum 03/16/2011  . Sickle cell trait (HCC) 12/30/2010    Past Surgical History:  Procedure Laterality Date  . CHOLECYSTECTOMY  08/19/2011   Procedure: LAPAROSCOPIC CHOLECYSTECTOMY WITH INTRAOPERATIVE CHOLANGIOGRAM;  Surgeon: Lodema Pilot, DO;  Location: [facility];  Service: General;  Laterality: N/A;  . DILATION AND CURETTAGE OF UTERUS     molar preg  . LAPAROSCOPIC BILATERAL SALPINGECTOMY Bilateral 05/09/2013   Procedure: LAPAROSCOPIC BILATERAL SALPINGECTOMY;  Surgeon: Allie Bossier, MD;  Location: [facility];  Service: Gynecology;  Laterality: Bilateral;    OB History    Gravida  4   Para  3   Term  3   Preterm      AB  1   Living  3     SAB  1   IAB      Ectopic      Multiple      Live Births  3            Home Medications    Prior to Admission medications   Medication Sig Start Date End Date Taking? Authorizing Provider  amoxicillin-clavulanate (AUGMENTIN) 875-125 MG tablet Take 1 tablet by mouth every 12 (twelve) hours. 03/30/20   Janace Aris, NP  benzonatate (  TESSALON) 100 MG capsule Take 1 capsule (100 mg total) by mouth every 8 (eight) hours. 04/18/20   LampteyBritta Mccreedy, MD  citalopram (CELEXA) 20 MG tablet Take 1 tablet (20 mg total) by mouth daily. 07/10/19   Aldean Baker, NP  hydrOXYzine (ATARAX/VISTARIL) 25 MG tablet Take 1 tablet (25 mg total) by mouth every 8 (eight) hours as needed for anxiety. 07/09/19   Aldean Baker, NP  ibuprofen (ADVIL) 600 MG tablet Take 1 tablet (600 mg total) by mouth every 6 (six) hours as needed. 04/18/20   LampteyBritta Mccreedy, MD  methimazole (TAPAZOLE) 10 MG tablet Take 2 tablets (20 mg total) by mouth 2  (two) times daily. 07/09/19   Aldean Baker, NP  metoprolol tartrate (LOPRESSOR) 25 MG tablet Take 0.5 tablets (12.5 mg total) by mouth 2 (two) times daily. 07/09/19   Aldean Baker, NP  ondansetron (ZOFRAN ODT) 4 MG disintegrating tablet Take 1 tablet (4 mg total) by mouth every 8 (eight) hours as needed for nausea or vomiting. 04/18/20   Lamptey, Britta Mccreedy, MD  pantoprazole (PROTONIX) 40 MG tablet Take 1 tablet (40 mg total) by mouth daily. 07/10/19   Aldean Baker, NP  QUEtiapine (SEROQUEL) 300 MG tablet Take 1 tablet (300 mg total) by mouth at bedtime. 07/09/19   Aldean Baker, NP    Family History Family History  Problem Relation Age of Onset  . Diabetes Mother   . Cancer Father   . Diabetes Maternal Uncle   . Cancer Maternal Grandmother   . Cancer Maternal Grandfather   . Hypertension Maternal Grandfather   . Diabetes Maternal Grandfather   . Sickle cell anemia Son   . Asthma Son   . Heart disease Paternal Grandfather   . Hearing loss Paternal Aunt     Social History Social History   Tobacco Use  . Smoking status: Never Smoker  . Smokeless tobacco: Never Used  Vaping Use  . Vaping Use: Never used  Substance Use Topics  . Alcohol use: No  . Drug use: Yes    Types: Marijuana    Comment: occasional     Allergies   Patient has no known allergies.   Review of Systems Review of Systems  Respiratory: Negative for cough.   Cardiovascular: Positive for chest pain.  Gastrointestinal: Positive for abdominal pain and nausea.  All other systems reviewed and are negative.    Physical Exam Triage Vital Signs ED Triage Vitals  Enc Vitals Group     BP 06/06/20 0821 114/74     Pulse Rate 06/06/20 0821 94     Resp 06/06/20 0821 18     Temp 06/06/20 0821 98.5 F (36.9 C)     Temp src --      SpO2 06/06/20 0821 98 %     Weight --      Height --      Head Circumference --      Peak Flow --      Pain Score 06/06/20 0819 6     Pain Loc --      Pain Edu? --       Excl. in GC? --    No data found.  Updated Vital Signs BP 114/74   Pulse 94   Temp 98.5 F (36.9 C)   Resp 18   SpO2 98%   Visual Acuity Right Eye Distance:   Left Eye Distance:   Bilateral Distance:    Right Eye Near:  Left Eye Near:    Bilateral Near:     Physical Exam Vitals and nursing note reviewed.  Constitutional:      Appearance: She is well-developed and well-nourished.  HENT:     Head: Normocephalic.  Eyes:     Extraocular Movements: EOM normal.  Cardiovascular:     Rate and Rhythm: Normal rate and regular rhythm.     Heart sounds: Normal heart sounds.  Pulmonary:     Effort: Pulmonary effort is normal.  Abdominal:     General: There is no distension.  Musculoskeletal:        General: Normal range of motion.     Cervical back: Normal range of motion.  Neurological:     General: No focal deficit present.     Mental Status: She is alert and oriented to person, place, and time.  Psychiatric:        Mood and Affect: Mood and affect normal.      UC Treatments / Results  Labs (all labs ordered are listed, but only abnormal results are displayed) Labs Reviewed - No data to display  EKG   Radiology DG Chest 2 View  Result Date: 06/06/2020 CLINICAL DATA:  Dull left chest pain on going for few weeks. Pain is worse with activity. COVID positive on 04/18/2020 EXAM: CHEST - 2 VIEW COMPARISON:  05/02/2018 FINDINGS: Cardiomediastinal silhouette and pulmonary vasculature are within normal limits. The left hemidiaphragm is mildly elevated.  The lungs are clear. IMPRESSION: 1. No acute cardiopulmonary process. 2. Elevation of the left hemidiaphragm with prominent air-filled loops of colon underlying the left hemidiaphragm. This may be be contributing to the patient's dull left chest pain. Electronically Signed   By: Acquanetta Belling M.D.   On: 06/06/2020 08:51    Procedures Procedures (including critical care time)  Medications Ordered in UC Medications - No  data to display  Initial Impression / Assessment and Plan / UC Course  I have reviewed the triage vital signs and the nursing notes.  Pertinent labs & imaging results that were available during my care of the patient were reviewed by me and considered in my medical decision making (see chart for details).     Pt advised to try gasx. Pt advised to retur if any problems.  Final Clinical Impressions(s) / UC Diagnoses   Final diagnoses:  Chest pain, unspecified type   Discharge Instructions   None    ED Prescriptions    None     PDMP not reviewed this encounter.  An After Visit Summary was printed and given to the patient.    Elson Areas, [place] 06/06/20 [phone]

## 2020-06-06 NOTE — ED Triage Notes (Signed)
Pt in with c/o dull left cp that has been going on for a few weeks. States the pain is worse when she is active and she also feels sob  Denies radiation to arm, jaw, back Denies dizziness or vomiting

## 2020-09-04 ENCOUNTER — Other Ambulatory Visit: Payer: Self-pay

## 2020-09-04 ENCOUNTER — Ambulatory Visit (HOSPITAL_COMMUNITY)
Admission: EM | Admit: 2020-09-04 | Discharge: 2020-09-04 | Disposition: A | Discharge status: Home / Self Care | Payer: Medicaid Other

## 2020-09-05 ENCOUNTER — Encounter (HOSPITAL_COMMUNITY): Payer: Self-pay

## 2020-09-05 ENCOUNTER — Ambulatory Visit (HOSPITAL_COMMUNITY)
Admission: EM | Admit: 2020-09-05 | Discharge: 2020-09-05 | Disposition: A | Discharge status: Home / Self Care | Payer: Medicaid Other | Attending: Physician Assistant | Admitting: Physician Assistant

## 2020-09-05 VITALS — BP 115/74 | HR 104 | Temp 98.6°F | Resp 18

## 2020-09-05 DIAGNOSIS — R350 Frequency of micturition: Secondary | ICD-10-CM | POA: Diagnosis present

## 2020-09-05 DIAGNOSIS — R3915 Urgency of urination: Secondary | ICD-10-CM | POA: Diagnosis present

## 2020-09-05 DIAGNOSIS — R110 Nausea: Secondary | ICD-10-CM | POA: Insufficient documentation

## 2020-09-05 DIAGNOSIS — N390 Urinary tract infection, site not specified: Secondary | ICD-10-CM | POA: Diagnosis not present

## 2020-09-05 LAB — POCT URINALYSIS DIPSTICK, ED / UC
Bilirubin Urine: NEGATIVE
Glucose, UA: NEGATIVE mg/dL
Ketones, ur: NEGATIVE mg/dL
Nitrite: NEGATIVE
Protein, ur: NEGATIVE mg/dL
Specific Gravity, Urine: 1.015 (ref 1.005–1.030)
Urobilinogen, UA: 0.2 mg/dL (ref 0.0–1.0)
pH: 5.5 (ref 5.0–8.0)

## 2020-09-05 LAB — POC URINE PREG, ED: Preg Test, Ur: NEGATIVE

## 2020-09-05 MED ORDER — NITROFURANTOIN MONOHYD MACRO 100 MG PO CAPS: 100.0000 mg | ORAL_CAPSULE | Freq: Two times a day (BID) | ORAL | 0 refills | Status: AC

## 2020-09-05 NOTE — ED Provider Notes (Signed)
[facility]    CSN: [id number] Arrival date & time: 09/05/20  0915      History   Chief Complaint Chief Complaint  Patient presents with  . Nausea    HPI Grace [name] is a 35 y.o. female.   Patient presents today with a 1 week history of nausea.  She denies any abdominal pain, changes in bowel habits, melena, vomiting, hematemesis.  Reports she had a tubal ligation approximately 7 years ago and is having normal periods but is open to pregnancy testing.  She does have a history of recurrent UTIs but denies any recent symptoms.  Reports associated urinary urgency and frequency for the past week.  Denies dysuria, vaginal symptoms, flank pain, pelvic pain.  She has not had any over-the-counter medications for symptom management.  She denies history of seeing urologist, self-catheterization, recent urogenital procedure.  Denies any recent antibiotic use.     Past Medical History:  Diagnosis Date  . Cholecystitis 08/18/2011  . Headache(784.0)    otc med prn  . History of recurrent UTIs    recurrent UTIs  . Hypertension   . Hyperthyroidism   . Joint pain    knees and shoulders - otc med prn  . Sickle cell trait (HCC)   . Sickle cell trait (HCC)   . SVD (spontaneous vaginal delivery)    x 3 [facility]    Patient Active Problem List   Diagnosis Date Noted  . Severe recurrent major depression without psychotic features (HCC) 06/30/2019  . Major depressive disorder, single episode, severe without psychotic features (HCC) 01/05/2016  . Severe episode of recurrent major depressive disorder, with psychotic features (HCC)   . MDD (major depressive disorder), recurrent episode, severe (HCC) 04/15/2015  . Hyperthyroidism 03/21/2015  . Tachycardia 03/21/2015  . Palpitations 03/21/2015  . Chest pain 03/21/2015  . MDD (major depressive disorder), recurrent episode, moderate (HCC) 03/16/2015  . Consultation for female sterilization 05/09/2013  . NSVD (normal spontaneous  vaginal delivery) 03/30/2013  . Chronic hypertension in pregnancy 03/29/2013  . HTN in pregnancy, chronic 03/28/2013  . Transient hypertension of pregnancy, antepartum 03/20/2013  . GERD (gastroesophageal reflux disease) 01/18/2013  . Hyperemesis gravidarum before end of [redacted] week gestation with electrolyte imbalance 09/23/2012  . Trichomonas 09/23/2012  . Supervision of high-risk pregnancy 04/19/2011  . History of molar pregnancy, antepartum 03/22/2011  . Hyperthyroidism, antepartum 03/16/2011  . Sickle cell trait (HCC) 12/30/2010    Past Surgical History:  Procedure Laterality Date  . CHOLECYSTECTOMY  08/19/2011   Procedure: LAPAROSCOPIC CHOLECYSTECTOMY WITH INTRAOPERATIVE CHOLANGIOGRAM;  Surgeon: Lodema Pilot, DO;  Location: [facility];  Service: General;  Laterality: N/A;  . DILATION AND CURETTAGE OF UTERUS     molar preg  . LAPAROSCOPIC BILATERAL SALPINGECTOMY Bilateral 05/09/2013   Procedure: LAPAROSCOPIC BILATERAL SALPINGECTOMY;  Surgeon: Allie Bossier, MD;  Location: [facility];  Service: Gynecology;  Laterality: Bilateral;    OB History    Gravida  4   Para  3   Term  3   Preterm      AB  1   Living  3     SAB  1   IAB      Ectopic      Multiple      Live Births  3            Home Medications    Prior to Admission medications   Medication Sig Start Date End Date Taking? Authorizing Provider  citalopram (CELEXA) 20 MG  tablet Take 1 tablet (20 mg total) by mouth daily. 07/10/19  Yes Aldean Baker, NP  metoprolol tartrate (LOPRESSOR) 25 MG tablet Take 0.5 tablets (12.5 mg total) by mouth 2 (two) times daily. 07/09/19  Yes Aldean Baker, NP  nitrofurantoin, macrocrystal-monohydrate, (MACROBID) 100 MG capsule Take 1 capsule (100 mg total) by mouth 2 (two) times daily for 7 days. 09/05/20 09/12/20 Yes Eliseo Withers K, PA-C  QUEtiapine (SEROQUEL) 300 MG tablet Take 1 tablet (300 mg total) by mouth at bedtime. 07/09/19  Yes Aldean Baker, NP  hydrOXYzine (ATARAX/VISTARIL)  25 MG tablet Take 1 tablet (25 mg total) by mouth every 8 (eight) hours as needed for anxiety. 07/09/19   Aldean Baker, NP  ibuprofen (ADVIL) 600 MG tablet Take 1 tablet (600 mg total) by mouth every 6 (six) hours as needed. 04/18/20   Merrilee Jansky, MD  pantoprazole (PROTONIX) 40 MG tablet Take 1 tablet (40 mg total) by mouth daily. 07/10/19   Aldean Baker, NP  methimazole (TAPAZOLE) 10 MG tablet Take 2 tablets (20 mg total) by mouth 2 (two) times daily. 07/09/19 09/05/20  Aldean Baker, NP    Family History Family History  Problem Relation Age of Onset  . Diabetes Mother   . Cancer Father   . Diabetes Maternal Uncle   . Cancer Maternal Grandmother   . Cancer Maternal Grandfather   . Hypertension Maternal Grandfather   . Diabetes Maternal Grandfather   . Sickle cell anemia Son   . Asthma Son   . Heart disease Paternal Grandfather   . Hearing loss Paternal Aunt     Social History Social History   Tobacco Use  . Smoking status: Never Smoker  . Smokeless tobacco: Never Used  Vaping Use  . Vaping Use: Never used  Substance Use Topics  . Alcohol use: No  . Drug use: Yes    Types: Marijuana    Comment: occasional     Allergies   Patient has no known allergies.   Review of Systems Review of Systems  Constitutional: Negative for activity change, appetite change, fatigue and fever.  Respiratory: Negative for cough and shortness of breath.   Cardiovascular: Negative for chest pain.  Gastrointestinal: Positive for nausea. Negative for abdominal pain, diarrhea and vomiting.  Genitourinary: Positive for frequency and urgency. Negative for decreased urine volume, dysuria, flank pain, pelvic pain, vaginal bleeding, vaginal discharge and vaginal pain.  Musculoskeletal: Negative for arthralgias, back pain and myalgias.  Neurological: Negative for dizziness, light-headedness and headaches.     Physical Exam Triage Vital Signs ED Triage Vitals  Enc Vitals Group     BP  09/05/20 1024 115/74     Pulse Rate 09/05/20 1024 (!) 104     Resp 09/05/20 1024 18     Temp 09/05/20 1024 98.6 F (37 C)     Temp Source 09/05/20 1024 Oral     SpO2 09/05/20 1024 99 %     Weight --      Height --      Head Circumference --      Peak Flow --      Pain Score 09/05/20 1019 0     Pain Loc --      Pain Edu? --      Excl. in GC? --    No data found.  Updated Vital Signs BP 115/74 (BP Location: Right Arm)   Pulse (!) 104   Temp 98.6 F (37 C) (Oral)  Resp 18   SpO2 99%   Visual Acuity Right Eye Distance:   Left Eye Distance:   Bilateral Distance:    Right Eye Near:   Left Eye Near:    Bilateral Near:     Physical Exam Vitals reviewed.  Constitutional:      General: She is awake. She is not in acute distress.    Appearance: Normal appearance. She is not ill-appearing.     Comments: Very pleasant female appears stated age in no acute distress  HENT:     Head: Normocephalic and atraumatic.  Cardiovascular:     Rate and Rhythm: Normal rate and regular rhythm.     Heart sounds: No murmur heard.   Pulmonary:     Effort: Pulmonary effort is normal.     Breath sounds: Normal breath sounds. No wheezing, rhonchi or rales.     Comments: Clear to auscultation bilaterally Abdominal:     General: Bowel sounds are normal.     Palpations: Abdomen is soft.     Tenderness: There is no abdominal tenderness. There is no right CVA tenderness, left CVA tenderness, guarding or rebound.     Comments: Benign abdominal exam; nontender to palpation.  No CVA tenderness.  Psychiatric:        Behavior: Behavior is cooperative.      UC Treatments / Results  Labs (all labs ordered are listed, but only abnormal results are displayed) Labs Reviewed  POCT URINALYSIS DIPSTICK, ED / UC - Abnormal; Notable for the following components:      Result Value   Hgb urine dipstick MODERATE (*)    Leukocytes,Ua LARGE (*)    All other components within normal limits  URINE  CULTURE  POC URINE PREG, ED    EKG   Radiology No results found.  Procedures Procedures (including critical care time)  Medications Ordered in UC Medications - No data to display  Initial Impression / Assessment and Plan / UC Course  I have reviewed the triage vital signs and the nursing notes.  Pertinent labs & imaging results that were available during my care of the patient were reviewed by me and considered in my medical decision making (see chart for details).     UA showed hemoglobin and leukocytes.  Patient treated with Macrobid.  Urine culture obtained-results pending.  Will make recommendations based on laboratory findings.  Discussed potential need to change antibiotics based on susceptibilities identified on culture.  Urine pregnancy was negative.  Patient was encouraged to drink plenty of fluid.  Discussed that she should have her urine rechecked in 4 weeks given hemoglobin noted to ensure this resolves once infection has improved.  Strict return precautions given to which patient expressed understanding.  Final Clinical Impressions(s) / UC Diagnoses   Final diagnoses:  Lower urinary tract infectious disease  Nausea  Urinary frequency  Urinary urgency     Discharge Instructions     Take Macrobid to cover for urinary tract infection twice daily for 1 week.  We have sent your urine off for culture and if we need to change your antibiotic we will contact you to arrange this.  Make sure you are drinking plenty of fluid.  If anything worsens please return for reevaluation.  I would recommend having your primary care provider recheck your urine to make sure small amount of blood noticed is gone within 1 month.    ED Prescriptions    Medication Sig Dispense Auth. Provider   nitrofurantoin, macrocrystal-monohydrate, (MACROBID) 100  MG capsule Take 1 capsule (100 mg total) by mouth 2 (two) times daily for 7 days. 14 capsule Jaleal Schliep K, PA-C     PDMP not reviewed  this encounter.   Jeani Hawking, PA-C 09/05/20 1121

## 2020-09-05 NOTE — ED Triage Notes (Signed)
Pt presents today with c/o of nausea x1 week. Denies v/d.

## 2020-09-05 NOTE — Discharge Instructions (Signed)
Take Macrobid to cover for urinary tract infection twice daily for 1 week.  We have sent your urine off for culture and if we need to change your antibiotic we will contact you to arrange this.  Make sure you are drinking plenty of fluid.  If anything worsens please return for reevaluation.  I would recommend having your primary care provider recheck your urine to make sure small amount of blood noticed is gone within 1 month.

## 2020-09-06 LAB — URINE CULTURE

## 2020-09-26 ENCOUNTER — Ambulatory Visit (HOSPITAL_COMMUNITY)
Admission: EM | Admit: 2020-09-26 | Discharge: 2020-09-26 | Disposition: A | Discharge status: Home / Self Care | Payer: Medicaid Other | Attending: Physician Assistant | Admitting: Physician Assistant

## 2020-09-26 ENCOUNTER — Other Ambulatory Visit: Payer: Self-pay

## 2020-09-26 ENCOUNTER — Encounter (HOSPITAL_COMMUNITY): Payer: Self-pay

## 2020-09-26 DIAGNOSIS — N390 Urinary tract infection, site not specified: Secondary | ICD-10-CM | POA: Diagnosis present

## 2020-09-26 DIAGNOSIS — R110 Nausea: Secondary | ICD-10-CM | POA: Diagnosis not present

## 2020-09-26 LAB — CBC WITH DIFFERENTIAL/PLATELET
Abs Immature Granulocytes: 0.03 10*3/uL (ref 0.00–0.07)
Basophils Absolute: 0 10*3/uL (ref 0.0–0.1)
Basophils Relative: 0 %
Eosinophils Absolute: 0.1 10*3/uL (ref 0.0–0.5)
Eosinophils Relative: 1 %
HCT: 34.8 % — ABNORMAL LOW (ref 36.0–46.0)
Hemoglobin: 12.1 g/dL (ref 12.0–15.0)
Immature Granulocytes: 0 %
Lymphocytes Relative: 22 %
Lymphs Abs: 2.3 10*3/uL (ref 0.7–4.0)
MCH: 31.9 pg (ref 26.0–34.0)
MCHC: 34.8 g/dL (ref 30.0–36.0)
MCV: 91.8 fL (ref 80.0–100.0)
Monocytes Absolute: 0.6 10*3/uL (ref 0.1–1.0)
Monocytes Relative: 6 %
Neutro Abs: 7.5 10*3/uL (ref 1.7–7.7)
Neutrophils Relative %: 71 %
Platelets: 482 10*3/uL — ABNORMAL HIGH (ref 150–400)
RBC: 3.79 MIL/uL — ABNORMAL LOW (ref 3.87–5.11)
RDW: 11.6 % (ref 11.5–15.5)
WBC: 10.5 10*3/uL (ref 4.0–10.5)
nRBC: 0 % (ref 0.0–0.2)

## 2020-09-26 LAB — POCT URINALYSIS DIPSTICK, ED / UC
Bilirubin Urine: NEGATIVE
Glucose, UA: NEGATIVE mg/dL
Ketones, ur: NEGATIVE mg/dL
Nitrite: POSITIVE — AB
Protein, ur: NEGATIVE mg/dL
Specific Gravity, Urine: 1.01 (ref 1.005–1.030)
Urobilinogen, UA: 0.2 mg/dL (ref 0.0–1.0)
pH: 7 (ref 5.0–8.0)

## 2020-09-26 LAB — COMPREHENSIVE METABOLIC PANEL
ALT: 9 U/L (ref 0–44)
AST: 13 U/L — ABNORMAL LOW (ref 15–41)
Albumin: 3.9 g/dL (ref 3.5–5.0)
Alkaline Phosphatase: 48 U/L (ref 38–126)
Anion gap: 10 (ref 5–15)
BUN: 5 mg/dL — ABNORMAL LOW (ref 6–20)
CO2: 25 mmol/L (ref 22–32)
Calcium: 9.1 mg/dL (ref 8.9–10.3)
Chloride: 103 mmol/L (ref 98–111)
Creatinine, Ser: 0.67 mg/dL (ref 0.44–1.00)
GFR, Estimated: >60 mL/min (ref >60)
Glucose, Bld: 98 mg/dL (ref 70–99)
Potassium: 3.8 mmol/L (ref 3.5–5.1)
Sodium: 138 mmol/L (ref 135–145)
Total Bilirubin: 0.6 mg/dL (ref 0.3–1.2)
Total Protein: 7.5 g/dL (ref 6.5–8.1)

## 2020-09-26 LAB — POC URINE PREG, ED: Preg Test, Ur: NEGATIVE

## 2020-09-26 MED ORDER — CEPHALEXIN 500 MG PO CAPS: 500.0000 mg | ORAL_CAPSULE | Freq: Four times a day (QID) | ORAL | 0 refills | Status: AC

## 2020-09-26 NOTE — Discharge Instructions (Addendum)
See your Physicain for recheck.  Drink plenty of fluids

## 2020-09-26 NOTE — ED Triage Notes (Signed)
Pt reports nausea x 1 month. States is worse when wakes up. Denies any other symptoms.

## 2021-06-02 ENCOUNTER — Ambulatory Visit (HOSPITAL_COMMUNITY)
Admission: EM | Admit: 2021-06-02 | Discharge: 2021-06-02 | Discharge status: Against Medical Advice | Payer: Medicaid Other

## 2021-06-02 ENCOUNTER — Other Ambulatory Visit: Payer: Self-pay

## 2021-06-03 ENCOUNTER — Ambulatory Visit (HOSPITAL_COMMUNITY)
Admission: EM | Admit: 2021-06-03 | Discharge: 2021-06-03 | Disposition: A | Discharge status: Home / Self Care | Payer: Medicaid Other | Attending: Family Medicine | Admitting: Family Medicine

## 2021-06-03 ENCOUNTER — Encounter (HOSPITAL_COMMUNITY): Payer: Self-pay

## 2021-06-03 VITALS — BP 114/79 | HR 91 | Temp 98.5°F | Resp 16 | Ht 64.25 in | Wt 134.9 lb

## 2021-06-03 DIAGNOSIS — I10 Essential (primary) hypertension: Secondary | ICD-10-CM | POA: Insufficient documentation

## 2021-06-03 DIAGNOSIS — E0590 Thyrotoxicosis, unspecified without thyrotoxic crisis or storm: Secondary | ICD-10-CM | POA: Insufficient documentation

## 2021-06-03 LAB — TSH: TSH: 0.461 u[IU]/mL (ref 0.350–4.500)

## 2021-06-03 NOTE — ED Triage Notes (Signed)
Pt reports feeling flushed, fatigue and headache x 2 days.  Pt states she has an overactive thyroid and has not been able to monitor BP at home lately. Requesting to have BP checked.

## 2021-06-03 NOTE — Discharge Instructions (Signed)
You have had labs (blood work) drawn today. We will call you with any significant abnormalities or if there is need to begin or change treatment or pursue further follow up.  You may also review your test results online through MyChart. If you do not have a MyChart account, instructions to sign up should be on your discharge paperwork.  

## 2021-06-03 NOTE — ED Provider Notes (Signed)
[facility]   [id number] 06/03/21 Arrival Time: 1044  ASSESSMENT & PLAN:  1. Essential hypertension   2. Hyperthyroidism    Reassured BP normal here. Has been awhile since TSH was checked. Has seen endocrine in the past but now with a new medicaid.   Follow-up Information     [facility], [facility] [employer]   Specialty: Home Health Services Why: As needed. Contact information: [address] [place] [address] [phone]                  Discharge Instructions      You have had labs (blood work) drawn today. We will call you with any significant abnormalities or if there is need to begin or change treatment or pursue further follow up.  You may also review your test results online through MyChart. If you do not have a MyChart account, instructions to sign up should be on your discharge paperwork.      Reviewed expectations re: course of current medical issues. Questions answered. Outlined signs and symptoms indicating need for more acute intervention. Patient verbalized understanding. After Visit Summary given.   SUBJECTIVE:  Grace [name] is a 36 y.o. female who requests BP check. Batteries on her home machine were out. "Just wanted to come here and have it checked; feeling flush yesterday". Also with hyperthyroidism. Last TSH in 2021; 0.197; without specific symptoms here. Denies symptoms of chest pain, palpations, orthopnea, nocturnal dyspnea, or LE edema.  Social History   Tobacco Use  Smoking Status Never  Smokeless Tobacco Never    OBJECTIVE:  Vitals:   06/03/21 1114 06/03/21 1115  BP:  114/79  Pulse:  91  Resp:  16  Temp:  98.5 F (36.9 C)  TempSrc:  Oral  SpO2:  99%  Weight: 61.2 kg   Height: 5' 4.25" (1.632 m)     General appearance: alert; no distress Eyes: PERRLA; EOMI HENT: normocephalic; atraumatic Neck: supple Lungs: unlabored Heart: regular Abdomen: soft, non-tender; bowel sounds  normal Extremities: no edema; symmetrical with no gross deformities Skin: warm and dry Psychological: alert and cooperative; normal mood and affect    Labs:  Labs Reviewed  TSH     No Known Allergies  Past Medical History:  Diagnosis Date   Cholecystitis 08/18/2011   Headache(784.0)    otc med prn   History of recurrent UTIs    recurrent UTIs   Hypertension    Hyperthyroidism    Joint pain    knees and shoulders - otc med prn   Sickle cell trait (HCC)    Sickle cell trait (HCC)    SVD (spontaneous vaginal delivery)    x 3 [facility]   Social History   Socioeconomic History   Marital status: Single    Spouse name: Not on file   Number of children: Not on file   Years of education: Not on file   Highest education level: Not on file  Occupational History   Not on file  Tobacco Use   Smoking status: Never   Smokeless tobacco: Never  Vaping Use   Vaping Use: Never used  Substance and Sexual Activity   Alcohol use: No   Drug use: Yes    Types: Marijuana    Comment: occasional   Sexual activity: Yes    Birth control/protection: Surgical  Other Topics Concern   Not on file  Social History Narrative   2 Adults, 4 children at home   FOB involved  Social Determinants of Health   Financial Resource Strain: Not on file  Food Insecurity: Not on file  Transportation Needs: Not on file  Physical Activity: Not on file  Stress: Not on file  Social Connections: Not on file  Intimate Partner Violence: Not on file   Family History  Problem Relation Age of Onset   Diabetes Mother    Cancer Father    Diabetes Maternal Uncle    Cancer Maternal Grandmother    Cancer Maternal Grandfather    Hypertension Maternal Grandfather    Diabetes Maternal Grandfather    Sickle cell anemia Son    Asthma Son    Heart disease Paternal Grandfather    Hearing loss Paternal Aunt    Past Surgical History:  Procedure Laterality Date   CHOLECYSTECTOMY  08/19/2011   Procedure:  LAPAROSCOPIC CHOLECYSTECTOMY WITH INTRAOPERATIVE CHOLANGIOGRAM;  Surgeon: Lodema Pilot, DO;  Location: [facility];  Service: General;  Laterality: N/A;   DILATION AND CURETTAGE OF UTERUS     molar preg   LAPAROSCOPIC BILATERAL SALPINGECTOMY Bilateral 05/09/2013   Procedure: LAPAROSCOPIC BILATERAL SALPINGECTOMY;  Surgeon: Allie Bossier, MD;  Location: [facility];  Service: Gynecology;  Laterality: Bilateral;       Mardella Layman, MD 06/03/21 1258

## 2021-06-08 ENCOUNTER — Ambulatory Visit (HOSPITAL_COMMUNITY): Payer: Self-pay | Admitting: Psychiatry

## 2021-06-11 ENCOUNTER — Telehealth (INDEPENDENT_AMBULATORY_CARE_PROVIDER_SITE_OTHER): Payer: Medicaid Other | Admitting: Psychiatry

## 2021-06-11 DIAGNOSIS — F333 Major depressive disorder, recurrent, severe with psychotic symptoms: Secondary | ICD-10-CM

## 2021-06-11 MED ORDER — HYDROXYZINE HCL 25 MG PO TABS: 25.0000 mg | ORAL_TABLET | Freq: Three times a day (TID) | ORAL | 1 refills | Status: DC | PRN

## 2021-06-11 MED ORDER — CITALOPRAM HYDROBROMIDE 10 MG PO TABS: 10.0000 mg | ORAL_TABLET | Freq: Every day | ORAL | 1 refills | Status: DC

## 2021-06-11 MED ORDER — QUETIAPINE FUMARATE 50 MG PO TABS: 50.0000 mg | ORAL_TABLET | Freq: Every day | ORAL | 1 refills | Status: DC
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
## 2021-07-23 ENCOUNTER — Encounter (HOSPITAL_COMMUNITY): Payer: Self-pay

## 2021-07-23 ENCOUNTER — Telehealth (HOSPITAL_COMMUNITY): Payer: Self-pay | Admitting: Psychiatry

## 2021-07-29 ENCOUNTER — Ambulatory Visit (HOSPITAL_COMMUNITY): Payer: Self-pay | Admitting: Licensed Clinical Social Worker

## 2021-12-27 ENCOUNTER — Other Ambulatory Visit: Payer: Self-pay

## 2021-12-27 ENCOUNTER — Encounter (HOSPITAL_COMMUNITY): Payer: Self-pay | Admitting: Emergency Medicine

## 2021-12-27 ENCOUNTER — Emergency Department (HOSPITAL_COMMUNITY)
Admission: EM | Admit: 2021-12-27 | Discharge: 2021-12-27 | Discharge status: Against Medical Advice | Payer: Medicaid Other | Attending: Emergency Medicine | Admitting: Emergency Medicine

## 2021-12-27 DIAGNOSIS — R519 Headache, unspecified: Secondary | ICD-10-CM | POA: Insufficient documentation

## 2021-12-27 DIAGNOSIS — H53149 Visual discomfort, unspecified: Secondary | ICD-10-CM | POA: Insufficient documentation

## 2021-12-27 DIAGNOSIS — R110 Nausea: Secondary | ICD-10-CM | POA: Insufficient documentation

## 2021-12-27 DIAGNOSIS — Z5321 Procedure and treatment not carried out due to patient leaving prior to being seen by health care provider: Secondary | ICD-10-CM | POA: Insufficient documentation

## 2021-12-27 NOTE — ED Triage Notes (Signed)
Patient reports migraine headache unrelieved by OTC pain medications onset 3 days ago , mild nausea with photophobia .

## 2022-01-06 ENCOUNTER — Other Ambulatory Visit: Payer: Self-pay

## 2022-01-06 ENCOUNTER — Encounter (HOSPITAL_COMMUNITY): Payer: Self-pay | Admitting: Emergency Medicine

## 2022-01-06 ENCOUNTER — Emergency Department (HOSPITAL_COMMUNITY)
Admission: EM | Admit: 2022-01-06 | Discharge: 2022-01-07 | Disposition: A | Discharge status: Home / Self Care | Payer: Medicaid Other | Attending: Emergency Medicine | Admitting: Emergency Medicine

## 2022-01-06 DIAGNOSIS — E039 Hypothyroidism, unspecified: Secondary | ICD-10-CM | POA: Diagnosis not present

## 2022-01-06 DIAGNOSIS — N9489 Other specified conditions associated with female genital organs and menstrual cycle: Secondary | ICD-10-CM | POA: Insufficient documentation

## 2022-01-06 DIAGNOSIS — I10 Essential (primary) hypertension: Secondary | ICD-10-CM | POA: Insufficient documentation

## 2022-01-06 DIAGNOSIS — R519 Headache, unspecified: Secondary | ICD-10-CM | POA: Insufficient documentation

## 2022-01-06 DIAGNOSIS — Z79899 Other long term (current) drug therapy: Secondary | ICD-10-CM | POA: Diagnosis not present

## 2022-01-06 MED ORDER — ACETAMINOPHEN 325 MG PO TABS
650.0000 mg | ORAL_TABLET | Freq: Once | ORAL | Status: AC
  Administered 2022-01-06: 650 mg via ORAL
  Filled 2022-01-06: qty 2

## 2022-01-06 MED ORDER — ONDANSETRON 4 MG PO TBDP
4.0000 mg | ORAL_TABLET | Freq: Once | ORAL | Status: AC
  Administered 2022-01-06: 4 mg via ORAL
  Filled 2022-01-06: qty 1

## 2022-01-06 NOTE — ED Triage Notes (Signed)
Patient reports chronic migraine headache worse this week with photophobia and nausea.

## 2022-01-06 NOTE — ED Provider Triage Note (Signed)
Emergency Medicine Provider Triage Evaluation Note  Grace [name] , a 36 y.o. female  was evaluated in triage.  Pt complains of headache. Patient reports intermittent chronic migraines, worse over the past week lasting days at a time. Frontal, aggravated by light and loud noises, no alleviating factors. Associated nausea. Taking OTC nsaid w/o relief. Hx of similar, has not had one this bad in awhile. Denies loss of vision, dizziness, syncope, numbness, weakness, vomiting or fever.  Review of Systems  Per above  Physical Exam  BP 119/81   Pulse (!) 104   Temp 98.1 F (36.7 C) (Oral)   Resp 16   LMP 12/16/2021   SpO2 100%  Gen:   Awake, no distress   Resp:  Normal effort  MSK:   Moves extremities without difficulty  Other:  PERRL. EOMI. Strength & sensation grossly intact x 4. Ambulatory.   Medical Decision Making  Medically screening exam initiated at 11:37 PM.  Appropriate orders placed.  Grace [name] was informed that the remainder of the evaluation will be completed by another provider, this initial triage assessment does not replace that evaluation, and the importance of remaining in the ED until their evaluation is complete.  Headache.    Amaryllis Dyke, [place] 01/06/22 2345

## 2022-01-07 ENCOUNTER — Encounter (HOSPITAL_COMMUNITY): Payer: Self-pay

## 2022-01-07 LAB — I-STAT BETA HCG BLOOD, ED (MC, WL, AP ONLY): I-stat hCG, quantitative: <5 m[IU]/mL (ref <5)

## 2022-01-07 MED ORDER — DIPHENHYDRAMINE HCL 50 MG/ML IJ SOLN
12.5000 mg | Freq: Once | INTRAMUSCULAR | Status: AC
  Administered 2022-01-07: 12.5 mg via INTRAMUSCULAR
  Filled 2022-01-07: qty 1

## 2022-01-07 MED ORDER — KETOROLAC TROMETHAMINE 30 MG/ML IJ SOLN
30.0000 mg | Freq: Once | INTRAMUSCULAR | Status: AC
  Administered 2022-01-07: 30 mg via INTRAMUSCULAR
  Filled 2022-01-07: qty 1

## 2022-01-07 MED ORDER — PROCHLORPERAZINE EDISYLATE 10 MG/2ML IJ SOLN
10.0000 mg | Freq: Once | INTRAMUSCULAR | Status: AC
  Administered 2022-01-07: 10 mg via INTRAMUSCULAR
  Filled 2022-01-07: qty 2

## 2022-01-07 NOTE — Discharge Instructions (Signed)
You were seen today due to a headache.  Please continue to use over-the-counter medications as needed for your headache.  I recommend following up with your primary care provider at the health department for discussion of your chronic headaches.  If you experience an altered level of consciousness or other life-threatening conditions please return to the emergency department for reevaluation

## 2022-01-07 NOTE — ED Provider Notes (Signed)
[facility] EMERGENCY DEPARTMENT Provider Note   CSN: [id number] Arrival date & time: 01/06/22  2308     History  Chief Complaint  Patient presents with   Migraine Headache    Grace [name] is a 36 y.o. female.  Patient presents to the hospital complaining of a severe headache.  Patient states that she has had intermittent headaches for the past few days.  The headaches are described as being bilateral and going across the front of her head.  She rates the current severity at 8 out of 10.  The patient states she has tried over-the-counter medication such as Tylenol and ibuprofen at home with no relief.  She denies this being the worst headache of her life.  She states onset was gradual.  She endorses photophobia and mild nausea.  Patient with past medical history significant for severe headaches, hypertension, hypothyroidism, major depressive disorder, GERD  HPI     Home Medications Prior to Admission medications   Medication Sig Start Date End Date Taking? Authorizing Provider  citalopram (CELEXA) 10 MG tablet Take 1 tablet (10 mg total) by mouth daily. 06/11/21  Yes Penn, Cranston Neighbor, NP  hydrOXYzine (ATARAX) 25 MG tablet Take 1 tablet (25 mg total) by mouth every 8 (eight) hours as needed for anxiety. 06/11/21  Yes Penn, Cranston Neighbor, NP  QUEtiapine (SEROQUEL) 50 MG tablet Take 1 tablet (50 mg total) by mouth at bedtime. 06/11/21  Yes Penn, Cranston Neighbor, NP  metoprolol tartrate (LOPRESSOR) 25 MG tablet Take 0.5 tablets (12.5 mg total) by mouth 2 (two) times daily. Patient not taking: Reported on 01/07/2022 07/09/19   Aldean Baker, NP  methimazole (TAPAZOLE) 10 MG tablet Take 2 tablets (20 mg total) by mouth 2 (two) times daily. 07/09/19 09/05/20  Aldean Baker, NP      Allergies    Patient has no known allergies.    Review of Systems   Review of Systems  Constitutional:  Negative for fever.  Eyes:  Positive for photophobia.  Respiratory:  Negative for shortness of breath.    Gastrointestinal:  Positive for nausea. Negative for abdominal pain and vomiting.  Genitourinary:  Negative for dysuria.  Neurological:  Positive for headaches. Negative for dizziness, facial asymmetry, speech difficulty, weakness, light-headedness and numbness.    Physical Exam Updated Vital Signs BP 102/76   Pulse 79   Temp 98.1 F (36.7 C) (Oral)   Resp 18   Ht 5\' 4"  (1.626 m)   LMP 12/16/2021   SpO2 98%   BMI 23.16 kg/m  Physical Exam Vitals and nursing note reviewed.  Constitutional:      General: She is not in acute distress.    Appearance: She is well-developed.  HENT:     Head: Normocephalic and atraumatic.  Eyes:     Extraocular Movements: Extraocular movements intact.     Conjunctiva/sclera: Conjunctivae normal.     Pupils: Pupils are equal, round, and reactive to light.  Cardiovascular:     Rate and Rhythm: Normal rate.  Pulmonary:     Effort: Respiratory distress present.  Abdominal:     General: Abdomen is flat.  Musculoskeletal:        General: No swelling.     Cervical back: Neck supple.  Skin:    General: Skin is warm and dry.     Capillary Refill: Capillary refill takes less than 2 seconds.  Neurological:     Mental Status: She is alert.     Sensory: No sensory deficit.  Motor: No weakness.     Gait: Gait normal.  Psychiatric:        Mood and Affect: Mood normal.     ED Results / Procedures / Treatments   Labs (all labs ordered are listed, but only abnormal results are displayed) Labs Reviewed  I-STAT BETA HCG BLOOD, ED ([facility], [facility], [facility] ONLY)    EKG None  Radiology No results found.  Procedures Procedures    Medications Ordered in ED Medications  ondansetron (ZOFRAN-ODT) disintegrating tablet 4 mg (4 mg Oral Given 01/06/22 2339)  acetaminophen (TYLENOL) tablet 650 mg (650 mg Oral Given 01/06/22 2338)  ketorolac (TORADOL) 30 MG/ML injection 30 mg (30 mg Intramuscular Given 01/07/22 0841)  prochlorperazine (COMPAZINE) injection 10 mg  (10 mg Intramuscular Given 01/07/22 0844)  diphenhydrAMINE (BENADRYL) injection 12.5 mg (12.5 mg Intramuscular Given 01/07/22 0950)    ED Course/ Medical Decision Making/ A&P                           Medical Decision Making Risk Prescription drug management.   This patient presents to the ED for concern of severe headache, this involves an extensive number of treatment options, and is a complaint that carries with it a high risk of complications and morbidity.  The differential diagnosis includes migraine, cluster headache, tension headache, other headache disorder, intracranial abnormality, and others   Co morbidities that complicate the patient evaluation  History of frequent headaches   Additional history obtained:   External records from outside source obtained and reviewed including ED notes documenting previous visit for headaches   Lab Tests:  I Ordered, and personally interpreted labs.  The pertinent results include: Negative pregnancy test   Imaging Studies ordered:  I considered ordering imaging but the patient states that the headache is not the worst she has had and was gradual in onset.  There was no reported trauma.  No indication for CT head at this time.  Problem List / ED Course / Critical interventions / Medication management   I ordered medication including Compazine, Toradol, Tylenol for headache and Zofran for nausea.  I also ordered the patient Benadryl for her headache. Reevaluation of the patient after these medicines showed that the patient improved I have reviewed the patients home medicines and have made adjustments as needed   Social Determinants of Health:  Patient is primarily seen at the health department for her healthcare needs   Test / Admission - Considered:  The patient does not have neurologic deficits.  Very low clinical suspicion of any sort of intracranial abnormality.  The patient has frequent headaches and states that this is  similar to previous headaches.  Her pain improved significantly with the administration of medication.  I believe she may discharge home at this time and follow-up as an outpatient with the health department, hopefully establishing primary care at some point in the near future.  She may use over-the-counter medication as needed to keep the headaches under control.  Discharge home        Final Clinical Impression(s) / ED Diagnoses Final diagnoses:  Bad headache    Rx / DC Orders ED Discharge Orders     None         Ronny Bacon 01/07/22 1100    Sherwood Gambler, MD 01/08/22 [id number]

## 2022-01-31 ENCOUNTER — Other Ambulatory Visit: Payer: Self-pay

## 2022-01-31 ENCOUNTER — Encounter (HOSPITAL_COMMUNITY): Payer: Self-pay | Admitting: Emergency Medicine

## 2022-01-31 ENCOUNTER — Emergency Department (HOSPITAL_COMMUNITY)
Admission: EM | Admit: 2022-01-31 | Discharge: 2022-02-01 | Disposition: A | Discharge status: Home / Self Care | Payer: Medicaid Other | Attending: Emergency Medicine | Admitting: Emergency Medicine

## 2022-01-31 DIAGNOSIS — G44209 Tension-type headache, unspecified, not intractable: Secondary | ICD-10-CM | POA: Insufficient documentation

## 2022-01-31 DIAGNOSIS — R519 Headache, unspecified: Secondary | ICD-10-CM | POA: Diagnosis present

## 2022-01-31 LAB — POC URINE PREG, ED: Preg Test, Ur: NEGATIVE

## 2022-01-31 NOTE — ED Triage Notes (Signed)
Pt reports an increase in "migraines" however reports having one all day.  "It feels like a rubber band is squeezing my brain."  OTC have no effect.  Also reports sensitivity to light/sound, "a little nausea" and "some dizziness."  Reports hx of same but they have been "getting progressively worse."

## 2022-01-31 NOTE — ED Provider Triage Note (Signed)
Emergency Medicine Provider Triage Evaluation Note  Grace [name] , a 36 y.o. female  was evaluated in triage.  Pt complains of headache. Hx of migraine headache here with worsening headache x 3 days not relieved with home medication.  No fever, chills, rash, neck stiffness.  Mild light/sound sensitivity and nausea  Review of Systems  Positive: As above Negative: As above  Physical Exam  BP 128/81 (BP Location: Left Arm)   Pulse (!) 108   Temp 98.2 F (36.8 C) (Oral)   Resp 18   Ht 5\' 4"  (1.626 m)   Wt 63.5 kg   SpO2 100%   BMI 24.03 kg/m  Gen:   Awake, no distress   Resp:  Normal effort  MSK:   Moves extremities without difficulty  Other:    Medical Decision Making  Medically screening exam initiated at 9:32 PM.  Appropriate orders placed.  Grace [name] was informed that the remainder of the evaluation will be completed by another provider, this initial triage assessment does not replace that evaluation, and the importance of remaining in the ED until their evaluation is complete.     Domenic Moras, PA-C 01/31/22 2136

## 2022-02-01 ENCOUNTER — Emergency Department (HOSPITAL_COMMUNITY): Payer: Medicaid Other

## 2022-02-01 MED ORDER — PROCHLORPERAZINE EDISYLATE 10 MG/2ML IJ SOLN
10.0000 mg | Freq: Once | INTRAMUSCULAR | Status: AC
  Administered 2022-02-01: 10 mg via INTRAVENOUS
  Filled 2022-02-01: qty 2

## 2022-02-01 MED ORDER — DIPHENHYDRAMINE HCL 25 MG PO CAPS
25.0000 mg | ORAL_CAPSULE | Freq: Once | ORAL | Status: AC
  Administered 2022-02-01: 25 mg via ORAL
  Filled 2022-02-01: qty 1

## 2022-02-01 MED ORDER — SODIUM CHLORIDE 0.9 % IV BOLUS
1000.0000 mL | Freq: Once | INTRAVENOUS | Status: AC
  Administered 2022-02-01: 1000 mL via INTRAVENOUS

## 2022-02-01 MED ORDER — KETOROLAC TROMETHAMINE 15 MG/ML IJ SOLN
15.0000 mg | Freq: Once | INTRAMUSCULAR | Status: AC
  Administered 2022-02-01: 15 mg via INTRAVENOUS
  Filled 2022-02-01: qty 1

## 2022-02-01 NOTE — Discharge Instructions (Addendum)
It was a pleasure taking care of you today!  Your CT scan did not show any concerning findings today.  Ensure to maintain fluid intake.  You may take over-the-counter 600 mg ibuprofen every 6 hours and alternate with 500 mg Tylenol every 6 hours as needed for pain for no more than 7 days.  You may follow-up with the [facility] as needed.  You may also call and establish primary care with the [facility] community health and wellness.  You have been provided with a referral to neurology, they will call to set up a follow-up appointment regarding today's ED visit.  Return to the emergency department for experiencing increasing/worsening symptoms.

## 2022-02-01 NOTE — ED Notes (Signed)
Pt called 3x no answer  

## 2022-02-01 NOTE — ED Provider Notes (Signed)
[facility] EMERGENCY DEPARTMENT Provider Note   CSN: [id number] Arrival date & time: 01/31/22  2040     History  Chief Complaint  Patient presents with   Headache    Grace [name] is a 36 y.o. female with a PMHx of migraines, who presents to the Emergency Department complaining of intermittent band-like HA onset 1 month. Patient notes that she has had an intermittent headache for the past month. Notes that her current episode started 2 days ago. Her headache feels similar in nature to her previous headaches, however, she notes that his headache is the worst one. Denies sudden onset of her headache. Has associated nausea, photophobia, intermittent dizziness. Tried tylenol and ibuprofen without relief of her symptoms. Denies lightheadedness, LOC, vomiting, diplopia, blurred vision. Denies recent injury, trauma, fall. Pt doesn't have a PCP at this time.     The history is provided by the patient. No language interpreter was used.       Home Medications Prior to Admission medications   Medication Sig Start Date End Date Taking? Authorizing Provider  citalopram (CELEXA) 10 MG tablet Take 1 tablet (10 mg total) by mouth daily. 06/11/21   Penn, Cranston Neighbor, NP  hydrOXYzine (ATARAX) 25 MG tablet Take 1 tablet (25 mg total) by mouth every 8 (eight) hours as needed for anxiety. 06/11/21   Penn, Cranston Neighbor, NP  metoprolol tartrate (LOPRESSOR) 25 MG tablet Take 0.5 tablets (12.5 mg total) by mouth 2 (two) times daily. Patient not taking: Reported on 01/07/2022 07/09/19   Aldean Baker, NP  QUEtiapine (SEROQUEL) 50 MG tablet Take 1 tablet (50 mg total) by mouth at bedtime. 06/11/21   Penn, Cranston Neighbor, NP  methimazole (TAPAZOLE) 10 MG tablet Take 2 tablets (20 mg total) by mouth 2 (two) times daily. 07/09/19 09/05/20  Aldean Baker, NP      Allergies    Patient has no known allergies.    Review of Systems   Review of Systems  Eyes:  Positive for photophobia. Negative for visual  disturbance.  Gastrointestinal:  Positive for nausea. Negative for vomiting.  Neurological:  Positive for dizziness and headaches. Negative for syncope and light-headedness.  All other systems reviewed and are negative.   Physical Exam Updated Vital Signs BP 114/89 (BP Location: Right Arm)   Pulse 87   Temp 98.2 F (36.8 C) (Oral)   Resp 17   Ht 5\' 4"  (1.626 m)   Wt 63.5 kg   SpO2 100%   BMI 24.03 kg/m  Physical Exam Vitals and nursing note reviewed.  Constitutional:      General: She is not in acute distress.    Appearance: She is not diaphoretic.  HENT:     Head: Normocephalic and atraumatic.     Mouth/Throat:     Pharynx: No oropharyngeal exudate.  Eyes:     General: No visual field deficit or scleral icterus.    Conjunctiva/sclera: Conjunctivae normal.  Cardiovascular:     Rate and Rhythm: Normal rate and regular rhythm.     Pulses: Normal pulses.     Heart sounds: Normal heart sounds.  Pulmonary:     Effort: Pulmonary effort is normal. No respiratory distress.     Breath sounds: Normal breath sounds. No wheezing.  Abdominal:     General: There is no distension.     Palpations: Abdomen is soft.  Musculoskeletal:        General: Normal range of motion.     Cervical back: Normal range of  motion and neck supple.  Skin:    General: Skin is warm and dry.  Neurological:     General: No focal deficit present.     Mental Status: She is alert.     Cranial Nerves: Cranial nerves 2-12 are intact. No facial asymmetry.     Sensory: Sensation is intact. No sensory deficit.     Motor: Motor function is intact. No pronator drift.     Coordination: Coordination is intact. Finger-Nose-Finger Test and Heel to [facility] Test normal.     Comments: Strength and sensation intact to BUE and BLE. Grip strength 5/5 bilaterally. Negative pronator drift. Normal finger to nose. Normal heel to shin testing. No visual field deficit.   Psychiatric:        Behavior: Behavior normal.     ED  Results / Procedures / Treatments   Labs (all labs ordered are listed, but only abnormal results are displayed) Labs Reviewed  POC URINE PREG, ED    EKG None  Radiology CT Head Wo Contrast  Result Date: 02/01/2022 CLINICAL DATA:  Chronic headache.  General EXAM: CT HEAD WITHOUT CONTRAST TECHNIQUE: Contiguous axial images were obtained from the base of the skull through the vertex without intravenous contrast. RADIATION DOSE REDUCTION: This exam was performed according to the departmental dose-optimization program which includes automated exposure control, adjustment of the mA and/or kV according to patient size and/or use of iterative reconstruction technique. COMPARISON:  None Available. FINDINGS: Brain: No evidence of acute infarction, hemorrhage, hydrocephalus, extra-axial collection or mass lesion/mass effect. Vascular: No hyperdense vessel or unexpected calcification. Skull: Normal. Negative for fracture or focal lesion. Sinuses/Orbits: No acute finding. Other: None. IMPRESSION: No acute intracranial pathology. Electronically Signed   By: Larose Hires D.O.   On: 02/01/2022 10:58    Procedures Procedures    Medications Ordered in ED Medications  sodium chloride 0.9 % bolus 1,000 mL (0 mLs Intravenous Stopped 02/01/22 1108)  prochlorperazine (COMPAZINE) injection 10 mg (10 mg Intravenous Given 02/01/22 0944)  diphenhydrAMINE (BENADRYL) capsule 25 mg (25 mg Oral Given 02/01/22 0944)  ketorolac (TORADOL) 15 MG/ML injection 15 mg (15 mg Intravenous Given 02/01/22 0944)    ED Course/ Medical Decision Making/ A&P Clinical Course as of 02/01/22 1110  Mon Feb 01, 2022  1101 Patient reevaluated and noted to be ambulating in the hallway from the bathroom.  Patient noted improvement of symptoms with treatment regimen in the ED.  Discussed with patient discharge treatment plan.  Answered all available questions.  Patient for safe for discharge at this time. [SB]    Clinical Course User  Index [SB] Sulo Janczak A, PA-C                           Medical Decision Making Amount and/or Complexity of Data Reviewed Radiology: ordered.  Risk Prescription drug management.   Pt presented to the ED with intermittent band like headache onset 1 month with her current episode ongoing for 2 days. History of similar headaches, however, notes that her current headache feels the worst. No vision changes. Pt with photophobia. Pt afebrile. On exam, patient with no focal neurological deficits. No vision changes. No neck stiffness. Differential diagnosis includes tension headache, migraine, SAH, ICH.   Additional history obtained:  External records from outside source obtained and reviewed including: Pt presented to the ED with similar concerns on 01/06/22 at that time her symptoms were alleviated and she was discharged home.   Labs:  I ordered, and personally interpreted labs.  The pertinent results include:   Negative pregnancy urine  Imaging: I ordered imaging studies including CT head  I independently visualized and interpreted imaging which showed: no acute intracranial abnormality I agree with the radiologist interpretation  Medications:  I ordered medication including IVF, benadryl, compazine, toradol for migraine management Reevaluation of the patient after these medicines and interventions, I reevaluated the patient and found that they have improved I have reviewed the patients home medicines and have made adjustments as needed   Disposition: Presentation suspicious for tension type headache. Presentation less likely due to [facility] or ICH due to the absence of red flags. After consideration of the diagnostic results and the patients response to treatment, I feel that the patient would benefit from Discharge home. Pt provided with ambulatory referral to neurology due to her ongoing headaches. Supportive care measures and strict return precautions discussed with patient.  Patient  knowledges and verbalized understanding.  Patient agreeable to discharge treatment plan. Patient appears safe for discharge at this time.  Follow-up as indicated in the discharge paperwork.   This chart was dictated using voice recognition software, Dragon. Despite the best efforts of this provider to proofread and correct errors, errors may still occur which can change documentation meaning.  Final Clinical Impression(s) / ED Diagnoses Final diagnoses:  Acute non intractable tension-type headache    Rx / DC Orders ED Discharge Orders          Ordered    Ambulatory referral to Neurology       Comments: An appointment is requested in approximately: 2 weeks   02/01/22 1108              Nataliyah Packham A, PA-C 02/01/22 1113    Carmin Muskrat, MD 02/02/22 1204

## 2022-03-31 ENCOUNTER — Other Ambulatory Visit: Payer: Self-pay

## 2022-03-31 ENCOUNTER — Encounter (HOSPITAL_COMMUNITY): Payer: Self-pay

## 2022-03-31 ENCOUNTER — Emergency Department (EMERGENCY_DEPARTMENT_HOSPITAL)
Admission: EM | Admit: 2022-03-31 | Discharge: 2022-04-01 | Disposition: A | Discharge status: Home / Self Care | Payer: Medicaid Other | Source: Home / Self Care | Attending: Emergency Medicine | Admitting: Emergency Medicine

## 2022-03-31 DIAGNOSIS — F331 Major depressive disorder, recurrent, moderate: Secondary | ICD-10-CM | POA: Diagnosis present

## 2022-03-31 DIAGNOSIS — Z1152 Encounter for screening for COVID-19: Secondary | ICD-10-CM | POA: Insufficient documentation

## 2022-03-31 DIAGNOSIS — E039 Hypothyroidism, unspecified: Secondary | ICD-10-CM | POA: Insufficient documentation

## 2022-03-31 DIAGNOSIS — R45851 Suicidal ideations: Secondary | ICD-10-CM | POA: Insufficient documentation

## 2022-03-31 DIAGNOSIS — F329 Major depressive disorder, single episode, unspecified: Secondary | ICD-10-CM | POA: Insufficient documentation

## 2022-03-31 DIAGNOSIS — I10 Essential (primary) hypertension: Secondary | ICD-10-CM | POA: Insufficient documentation

## 2022-03-31 LAB — ETHANOL: Alcohol, Ethyl (B): <10 mg/dL (ref <10)

## 2022-03-31 LAB — CBC WITH DIFFERENTIAL/PLATELET
Abs Immature Granulocytes: 0.02 10*3/uL (ref 0.00–0.07)
Basophils Absolute: 0 10*3/uL (ref 0.0–0.1)
Basophils Relative: 0 %
Eosinophils Absolute: 0.1 10*3/uL (ref 0.0–0.5)
Eosinophils Relative: 1 %
HCT: 34.9 % — ABNORMAL LOW (ref 36.0–46.0)
Hemoglobin: 12.2 g/dL (ref 12.0–15.0)
Immature Granulocytes: 0 %
Lymphocytes Relative: 32 %
Lymphs Abs: 3 10*3/uL (ref 0.7–4.0)
MCH: 32.9 pg (ref 26.0–34.0)
MCHC: 35 g/dL (ref 30.0–36.0)
MCV: 94.1 fL (ref 80.0–100.0)
Monocytes Absolute: 0.8 10*3/uL (ref 0.1–1.0)
Monocytes Relative: 9 %
Neutro Abs: 5.4 10*3/uL (ref 1.7–7.7)
Neutrophils Relative %: 58 %
Platelets: 347 10*3/uL (ref 150–400)
RBC: 3.71 MIL/uL — ABNORMAL LOW (ref 3.87–5.11)
RDW: 12 % (ref 11.5–15.5)
WBC: 9.4 10*3/uL (ref 4.0–10.5)
nRBC: 0 % (ref 0.0–0.2)

## 2022-03-31 LAB — URINALYSIS, ROUTINE W REFLEX MICROSCOPIC
Bilirubin Urine: NEGATIVE
Glucose, UA: NEGATIVE mg/dL
Ketones, ur: NEGATIVE mg/dL
Nitrite: POSITIVE — AB
Protein, ur: NEGATIVE mg/dL
Specific Gravity, Urine: 1.011 (ref 1.005–1.030)
pH: 5 (ref 5.0–8.0)

## 2022-03-31 LAB — RAPID URINE DRUG SCREEN, HOSP PERFORMED
Amphetamines: POSITIVE — AB
Barbiturates: NOT DETECTED
Benzodiazepines: NOT DETECTED
Cocaine: NOT DETECTED
Opiates: NOT DETECTED
Tetrahydrocannabinol: POSITIVE — AB

## 2022-03-31 LAB — COMPREHENSIVE METABOLIC PANEL
ALT: 16 U/L (ref 0–44)
AST: 21 U/L (ref 15–41)
Albumin: 4.1 g/dL (ref 3.5–5.0)
Alkaline Phosphatase: 38 U/L (ref 38–126)
Anion gap: 8 (ref 5–15)
BUN: 14 mg/dL (ref 6–20)
CO2: 26 mmol/L (ref 22–32)
Calcium: 9.3 mg/dL (ref 8.9–10.3)
Chloride: 108 mmol/L (ref 98–111)
Creatinine, Ser: 0.88 mg/dL (ref 0.44–1.00)
GFR, Estimated: >60 mL/min (ref >60)
Glucose, Bld: 123 mg/dL — ABNORMAL HIGH (ref 70–99)
Potassium: 3.8 mmol/L (ref 3.5–5.1)
Sodium: 142 mmol/L (ref 135–145)
Total Bilirubin: 0.3 mg/dL (ref 0.3–1.2)
Total Protein: 7.5 g/dL (ref 6.5–8.1)

## 2022-03-31 LAB — SALICYLATE LEVEL: Salicylate Lvl: <7 mg/dL — ABNORMAL LOW (ref 7.0–30.0)

## 2022-03-31 LAB — ACETAMINOPHEN LEVEL: Acetaminophen (Tylenol), Serum: <10 ug/mL — ABNORMAL LOW (ref 10–30)

## 2022-03-31 MED ORDER — CITALOPRAM HYDROBROMIDE 10 MG PO TABS
10.0000 mg | ORAL_TABLET | Freq: Every day | ORAL | Status: DC
  Administered 2022-04-01: 10 mg via ORAL
  Filled 2022-03-31: qty 1

## 2022-03-31 MED ORDER — CEPHALEXIN 500 MG PO CAPS
500.0000 mg | ORAL_CAPSULE | Freq: Three times a day (TID) | ORAL | Status: DC
  Administered 2022-03-31 – 2022-04-01 (×3): 500 mg via ORAL
  Filled 2022-03-31 (×3): qty 1

## 2022-03-31 MED ORDER — QUETIAPINE FUMARATE 50 MG PO TABS
50.0000 mg | ORAL_TABLET | Freq: Every day | ORAL | Status: DC
  Administered 2022-03-31: 50 mg via ORAL
  Filled 2022-03-31: qty 1

## 2022-03-31 MED ORDER — METOPROLOL TARTRATE 25 MG PO TABS
12.5000 mg | ORAL_TABLET | Freq: Two times a day (BID) | ORAL | Status: DC
  Administered 2022-04-01: 12.5 mg via ORAL
  Filled 2022-03-31: qty 1

## 2022-03-31 NOTE — ED Triage Notes (Signed)
Suicidal ideation with a plan to take excess sleeping medications.   Multiple stressors such as recent date of when father passed away, and not having her kids.

## 2022-03-31 NOTE — ED Provider Notes (Signed)
[facility]-EMERGENCY DEPT Provider Note   CSN: [id number] Arrival date & time: 03/31/22  2017     History  Chief Complaint  Patient presents with   Suicidal    Grace [name] is a 36 y.o. female.  Patient with hypothyroidism, sickle cell trait, hypertension presenting with suicidal thoughts.  States symptoms have been worsening over the past several weeks.  She is not prescribed anything for depression or anxiety currently but has been in the past.  States she intentionally took 5 tablets of Advil PM approximately 2 PM and has plan to take more.  Denies any other ingestions.  Uses marijuana but no other drugs or alcohol.  Denies chest pain, shortness of breath, abdominal pain, nausea vomiting, cough or fever.  Denies any homicidal thoughts or hallucinations.  Admits to marijuana use but no other drug use.  Denies any other ingestions.  The history is provided by the patient.       Home Medications Prior to Admission medications   Medication Sig Start Date End Date Taking? Authorizing Provider  citalopram (CELEXA) 10 MG tablet Take 1 tablet (10 mg total) by mouth daily. 06/11/21   Penn, Cranston Neighbor, NP  hydrOXYzine (ATARAX) 25 MG tablet Take 1 tablet (25 mg total) by mouth every 8 (eight) hours as needed for anxiety. 06/11/21   Penn, Cranston Neighbor, NP  metoprolol tartrate (LOPRESSOR) 25 MG tablet Take 0.5 tablets (12.5 mg total) by mouth 2 (two) times daily. Patient not taking: Reported on 01/07/2022 07/09/19   Aldean Baker, NP  QUEtiapine (SEROQUEL) 50 MG tablet Take 1 tablet (50 mg total) by mouth at bedtime. 06/11/21   Penn, Cranston Neighbor, NP  methimazole (TAPAZOLE) 10 MG tablet Take 2 tablets (20 mg total) by mouth 2 (two) times daily. 07/09/19 09/05/20  Aldean Baker, NP      Allergies    Patient has no known allergies.    Review of Systems   Review of Systems  Constitutional:  Negative for activity change, appetite change and fever.  HENT:  Negative for congestion.    Respiratory:  Negative for cough, chest tightness and shortness of breath.   Cardiovascular:  Negative for chest pain.  Gastrointestinal:  Negative for abdominal pain, nausea and vomiting.  Genitourinary:  Negative for dysuria and hematuria.  Musculoskeletal:  Negative for arthralgias and myalgias.  Skin:  Negative for rash.  Neurological:  Negative for dizziness, weakness and headaches.   all other systems are negative except as noted in the HPI and PMH.    Physical Exam Updated Vital Signs BP 119/84 (BP Location: Right Arm)   Pulse (!) 111   Temp 98.2 F (36.8 C) (Oral)   Resp 18   SpO2 100%  Physical Exam Vitals and nursing note reviewed.  Constitutional:      General: She is not in acute distress.    Appearance: She is well-developed.     Comments: Flat affect  HENT:     Head: Normocephalic and atraumatic.     Mouth/Throat:     Pharynx: No oropharyngeal exudate.  Eyes:     Conjunctiva/sclera: Conjunctivae normal.     Pupils: Pupils are equal, round, and reactive to light.  Neck:     Comments: No meningismus. Cardiovascular:     Rate and Rhythm: Regular rhythm. Tachycardia present.     Heart sounds: Normal heart sounds. No murmur heard. Pulmonary:     Effort: Pulmonary effort is normal. No respiratory distress.     Breath sounds:  Normal breath sounds.  Abdominal:     Palpations: Abdomen is soft.     Tenderness: There is no abdominal tenderness. There is no guarding or rebound.  Musculoskeletal:        General: No tenderness. Normal range of motion.     Cervical back: Normal range of motion and neck supple.  Skin:    General: Skin is warm.  Neurological:     Mental Status: She is alert and oriented to person, place, and time.     Cranial Nerves: No cranial nerve deficit.     Motor: No abnormal muscle tone.     Coordination: Coordination normal.     Comments: No ataxia on finger to nose bilaterally. No pronator drift. 5/5 strength throughout. CN 2-12  intact.Equal grip strength. Sensation intact.   Psychiatric:        Behavior: Behavior normal.     ED Results / Procedures / Treatments   Labs (all labs ordered are listed, but only abnormal results are displayed) Labs Reviewed  CBC WITH DIFFERENTIAL/PLATELET - Abnormal; Notable for the following components:      Result Value   RBC 3.71 (*)    HCT 34.9 (*)    All other components within normal limits  COMPREHENSIVE METABOLIC PANEL - Abnormal; Notable for the following components:   Glucose, Bld 123 (*)    All other components within normal limits  ACETAMINOPHEN LEVEL - Abnormal; Notable for the following components:   Acetaminophen (Tylenol), Serum <10 (*)    All other components within normal limits  SALICYLATE LEVEL - Abnormal; Notable for the following components:   Salicylate Lvl <7.0 (*)    All other components within normal limits  RAPID URINE DRUG SCREEN, HOSP PERFORMED - Abnormal; Notable for the following components:   Amphetamines POSITIVE (*)    Tetrahydrocannabinol POSITIVE (*)    All other components within normal limits  URINALYSIS, ROUTINE W REFLEX MICROSCOPIC - Abnormal; Notable for the following components:   APPearance HAZY (*)    Hgb urine dipstick SMALL (*)    Nitrite POSITIVE (*)    Leukocytes,Ua SMALL (*)    Bacteria, UA MANY (*)    All other components within normal limits  ETHANOL    EKG EKG Interpretation  Date/Time:  Wednesday March 31 2022 22:31:25 EST Ventricular Rate:  78 PR Interval:  150 QRS Duration: 82 QT Interval:  376 QTC Calculation: 429 R Axis:   30 Text Interpretation: Sinus rhythm Abnormal R-wave progression, early transition No significant change was found Confirmed by Glynn Octave [phone]) on 03/31/2022 10:37:15 PM  Radiology No results found.  Procedures Procedures    Medications Ordered in ED Medications - No data to display  ED Course/ Medical Decision Making/ A&P                           Medical  Decision Making Amount and/or Complexity of Data Reviewed Labs: ordered. Decision-making details documented in ED Course. Radiology: ordered and independent interpretation performed. Decision-making details documented in ED Course. ECG/medicine tests: ordered and independent interpretation performed. Decision-making details documented in ED Course.  Risk Prescription drug management.  Suicidal thoughts with intentional overdose of Advil PM tablets about 2 PM.  Tachycardic but stable vitals.  She is calm and cooperative.  Discussed with poison control.  Patient out of the 6-hour window of observation recommended for Advil PM ingestion.  She is still tachycardic.  Recommend EKG to assess QRS and QT.  Continue observation but low likelihood that she will need treatment for anticholinergic overdose.  EKG is sinus rhythm.  QRS 82, QTc 429.  Screening labs are reassuring.  Toxicology labs are negative.  Normal anion gap.  Patient remains calm and cooperative.  Heart rate improved to 78.  She appears to likely clear for TTS evaluation.  Incidentally has UTI, will treat.  Holding orders placed.  She is medically clear for TTS evaluation.        Final Clinical Impression(s) / ED Diagnoses Final diagnoses:  Suicidal ideation    Rx / DC Orders ED Discharge Orders     None         Nahsir Venezia, Jeannett Senior, MD 03/31/22 2240

## 2022-04-01 ENCOUNTER — Inpatient Hospital Stay (HOSPITAL_COMMUNITY)
Admission: AD | Admit: 2022-04-01 | Discharge: 2022-04-13 | DRG: 885 | Disposition: A | Discharge status: Home / Self Care | Payer: Medicaid Other | Source: Intra-hospital | Attending: Psychiatry | Admitting: Psychiatry

## 2022-04-01 ENCOUNTER — Encounter (HOSPITAL_COMMUNITY): Payer: Self-pay | Admitting: Psychiatry

## 2022-04-01 ENCOUNTER — Other Ambulatory Visit: Payer: Self-pay

## 2022-04-01 DIAGNOSIS — Z825 Family history of asthma and other chronic lower respiratory diseases: Secondary | ICD-10-CM | POA: Diagnosis not present

## 2022-04-01 DIAGNOSIS — N390 Urinary tract infection, site not specified: Secondary | ICD-10-CM | POA: Diagnosis present

## 2022-04-01 DIAGNOSIS — F17200 Nicotine dependence, unspecified, uncomplicated: Secondary | ICD-10-CM | POA: Diagnosis present

## 2022-04-01 DIAGNOSIS — F410 Panic disorder [episodic paroxysmal anxiety] without agoraphobia: Secondary | ICD-10-CM | POA: Diagnosis present

## 2022-04-01 DIAGNOSIS — Z79899 Other long term (current) drug therapy: Secondary | ICD-10-CM | POA: Diagnosis not present

## 2022-04-01 DIAGNOSIS — F1210 Cannabis abuse, uncomplicated: Secondary | ICD-10-CM | POA: Diagnosis present

## 2022-04-01 DIAGNOSIS — R4587 Impulsiveness: Secondary | ICD-10-CM | POA: Diagnosis present

## 2022-04-01 DIAGNOSIS — E0590 Thyrotoxicosis, unspecified without thyrotoxic crisis or storm: Secondary | ICD-10-CM | POA: Diagnosis present

## 2022-04-01 DIAGNOSIS — F1290 Cannabis use, unspecified, uncomplicated: Secondary | ICD-10-CM | POA: Insufficient documentation

## 2022-04-01 DIAGNOSIS — F1090 Alcohol use, unspecified, uncomplicated: Secondary | ICD-10-CM | POA: Insufficient documentation

## 2022-04-01 DIAGNOSIS — T50902S Poisoning by unspecified drugs, medicaments and biological substances, intentional self-harm, sequela: Secondary | ICD-10-CM | POA: Diagnosis not present

## 2022-04-01 DIAGNOSIS — Z818 Family history of other mental and behavioral disorders: Secondary | ICD-10-CM | POA: Diagnosis not present

## 2022-04-01 DIAGNOSIS — F333 Major depressive disorder, recurrent, severe with psychotic symptoms: Principal | ICD-10-CM | POA: Diagnosis present

## 2022-04-01 DIAGNOSIS — Z91411 Personal history of adult psychological abuse: Secondary | ICD-10-CM | POA: Diagnosis not present

## 2022-04-01 DIAGNOSIS — Z789 Other specified health status: Secondary | ICD-10-CM | POA: Insufficient documentation

## 2022-04-01 DIAGNOSIS — D573 Sickle-cell trait: Secondary | ICD-10-CM | POA: Diagnosis present

## 2022-04-01 DIAGNOSIS — Z8249 Family history of ischemic heart disease and other diseases of the circulatory system: Secondary | ICD-10-CM | POA: Diagnosis not present

## 2022-04-01 DIAGNOSIS — Z72820 Sleep deprivation: Secondary | ICD-10-CM | POA: Diagnosis not present

## 2022-04-01 DIAGNOSIS — Z560 Unemployment, unspecified: Secondary | ICD-10-CM

## 2022-04-01 DIAGNOSIS — I10 Essential (primary) hypertension: Secondary | ICD-10-CM | POA: Diagnosis present

## 2022-04-01 DIAGNOSIS — Z9151 Personal history of suicidal behavior: Secondary | ICD-10-CM | POA: Diagnosis not present

## 2022-04-01 DIAGNOSIS — E039 Hypothyroidism, unspecified: Secondary | ICD-10-CM | POA: Diagnosis present

## 2022-04-01 DIAGNOSIS — F331 Major depressive disorder, recurrent, moderate: Secondary | ICD-10-CM | POA: Diagnosis not present

## 2022-04-01 DIAGNOSIS — Z599 Problem related to housing and economic circumstances, unspecified: Secondary | ICD-10-CM | POA: Diagnosis not present

## 2022-04-01 DIAGNOSIS — Z832 Family history of diseases of the blood and blood-forming organs and certain disorders involving the immune mechanism: Secondary | ICD-10-CM | POA: Diagnosis not present

## 2022-04-01 DIAGNOSIS — F411 Generalized anxiety disorder: Secondary | ICD-10-CM | POA: Insufficient documentation

## 2022-04-01 DIAGNOSIS — Z1152 Encounter for screening for COVID-19: Secondary | ICD-10-CM | POA: Diagnosis not present

## 2022-04-01 DIAGNOSIS — Z833 Family history of diabetes mellitus: Secondary | ICD-10-CM | POA: Diagnosis not present

## 2022-04-01 DIAGNOSIS — Z91410 Personal history of adult physical and sexual abuse: Secondary | ICD-10-CM | POA: Diagnosis not present

## 2022-04-01 DIAGNOSIS — F329 Major depressive disorder, single episode, unspecified: Secondary | ICD-10-CM | POA: Diagnosis present

## 2022-04-01 LAB — RESP PANEL BY RT-PCR (RSV, FLU A&B, COVID)  RVPGX2
Influenza A by PCR: NEGATIVE
Influenza B by PCR: NEGATIVE
Resp Syncytial Virus by PCR: NEGATIVE
SARS Coronavirus 2 by RT PCR: NEGATIVE

## 2022-04-01 MED ORDER — CITALOPRAM HYDROBROMIDE 10 MG PO TABS
10.0000 mg | ORAL_TABLET | Freq: Every day | ORAL | Status: DC
  Administered 2022-04-02 – 2022-04-05 (×4): 10 mg via ORAL
  Filled 2022-04-01 (×5): qty 1

## 2022-04-01 MED ORDER — METOPROLOL TARTRATE 25 MG PO TABS
12.5000 mg | ORAL_TABLET | Freq: Two times a day (BID) | ORAL | Status: DC
  Administered 2022-04-01 – 2022-04-13 (×21): 12.5 mg via ORAL
  Filled 2022-04-01: qty 0.5
  Filled 2022-04-01: qty 1
  Filled 2022-04-01 (×2): qty 10
  Filled 2022-04-01: qty 1
  Filled 2022-04-01: qty 0.5
  Filled 2022-04-01: qty 10
  Filled 2022-04-01: qty 0.5
  Filled 2022-04-01: qty 10
  Filled 2022-04-01 (×4): qty 0.5
  Filled 2022-04-01: qty 10
  Filled 2022-04-01: qty 0.5
  Filled 2022-04-01 (×2): qty 1
  Filled 2022-04-01: qty 10
  Filled 2022-04-01: qty 0.5
  Filled 2022-04-01: qty 10
  Filled 2022-04-01: qty 0.5
  Filled 2022-04-01 (×2): qty 1
  Filled 2022-04-01: qty 10
  Filled 2022-04-01: qty 1
  Filled 2022-04-01: qty 0.5
  Filled 2022-04-01: qty 10
  Filled 2022-04-01 (×2): qty 1
  Filled 2022-04-01: qty 10
  Filled 2022-04-01 (×3): qty 0.5

## 2022-04-01 MED ORDER — HYDROXYZINE HCL 25 MG PO TABS
25.0000 mg | ORAL_TABLET | Freq: Three times a day (TID) | ORAL | Status: DC | PRN
  Administered 2022-04-01 – 2022-04-08 (×15): 25 mg via ORAL
  Filled 2022-04-01 (×16): qty 1

## 2022-04-01 MED ORDER — CEPHALEXIN 500 MG PO CAPS
500.0000 mg | ORAL_CAPSULE | Freq: Three times a day (TID) | ORAL | Status: AC
  Administered 2022-04-01 – 2022-04-05 (×12): 500 mg via ORAL
  Filled 2022-04-01 (×12): qty 1

## 2022-04-01 MED ORDER — ACETAMINOPHEN 325 MG PO TABS
650.0000 mg | ORAL_TABLET | Freq: Four times a day (QID) | ORAL | Status: DC | PRN
  Administered 2022-04-03 – 2022-04-04 (×2): 650 mg via ORAL
  Filled 2022-04-01 (×2): qty 2

## 2022-04-01 MED ORDER — QUETIAPINE FUMARATE 50 MG PO TABS
50.0000 mg | ORAL_TABLET | Freq: Every day | ORAL | Status: DC
  Administered 2022-04-01 – 2022-04-02 (×2): 50 mg via ORAL
  Filled 2022-04-01 (×6): qty 1

## 2022-04-01 MED ORDER — MAGNESIUM HYDROXIDE 400 MG/5ML PO SUSP: 30.0000 mL | Freq: Every day | ORAL | Status: DC | PRN

## 2022-04-01 MED ORDER — ALUM & MAG HYDROXIDE-SIMETH 200-200-20 MG/5ML PO SUSP
30.0000 mL | ORAL | Status: DC | PRN
  Administered 2022-04-03 – 2022-04-05 (×3): 30 mL via ORAL
  Filled 2022-04-01 (×3): qty 30

## 2022-04-01 NOTE — Consult Note (Signed)
BH ED ASSESSMENT   Reason for Consult:  Suicidal Referring Physician:  Dr. Kathie Rhodes Rancour Patient Identification: Grace [name] MRN:  [id number] ED Chief Complaint: MDD (major depressive disorder), recurrent episode, moderate (HCC)  Diagnosis:  Principal Problem:   MDD (major depressive disorder), recurrent episode, moderate (HCC)   ED Assessment Time Calculation: Start Time: 1030 Stop Time: 1110 Total Time in Minutes (Assessment Completion): 40   Subjective:   Grace [name] is a 36 y.o. female with suicidal thoughts.   HPI:  Grace [name] 36 y.o., female patient presented to [facility] with Patient with hypothyroidism, sickle cell trait, hypertension presenting with suicidal thoughts. States symptoms have been worsening over the past several weeks. She is not prescribed anything for depression or anxiety currently but has been in the past. States she intentionally took 5 tablets of Advil PM approximately 2 PM and has plan to take more. Denies any other ingestions. Uses marijuana but no other drugs or alcohol.   Grace [name], 36 y.o., female patient seen face to face by this provider, chart reviewed on 04/01/22. On evaluation Grace [name] reports that she took 5 tablets of Ibuprofen PM, unsure of milligrams. Says that she has been dealing with depression and anxiety for a long time, and that holidays, her father passing in 2012, her 3 sons have been in the system for a couple years, all continue to be triggers for her. Patient states she has had a couple of SI attempts in the past, both times she has taken pain medications. She has been hospitalized once she says, in her late 71s. She endorses poor sleep 2-3 hrs a night, her mind races. She also states she barely eats, she wants to eat but can't so she forces herself to eat sometimes. She is working on classes to get her boys back, "but its a long process and its hard at times". She currently lives with sister, she says I don't  tell her a lot of what's going on, but she is my support system. Patient says she has increased her marijuana usage this month and alcohol consumption this month, she has been taking "shots" of liquor until she passes out. She denied any other drug use, but UDS was positive for amphetamines and when discussed with patient she was unsure of how she was positive for amphetamines. She endorses self-harm, "not really a plan", and she endorses visual hallucinations, stating she sometimes sees things that are not there. Denies HI/AH. States "if I leave the emergency room and go home, it wont be good", she was not able to elaborate.    During evaluation Grace [name] is sitting in the consult room in [facility], in no acute distress. She is alert, oriented x 4, calm, cooperative and attentive. Her mood is euthymic with a flat, sad affect. She has normal speech, and behavior.  Objectively there is no evidence of psychosis/mania or delusional thinking. Patient is able to converse coherently, goal directed thoughts, no distractibility, or pre-occupation. She endorses self-harm, "not really a plan", and she endorses visual hallucinations, stating she sometimes sees things that are not there. Denies HI/AH. States "if I leave the emergency room and go home, it wont be good", she was not able to elaborate. Patient answered question appropriately.     Past Psychiatric History: Depression, and admitted inpatient x 1  Risk to Self or Others: Is the patient at risk to self? Yes Has the patient been a risk to self in the past  6 months? No Has the patient been a risk to self within the distant past? Yes Is the patient a risk to others? No Has the patient been a risk to others in the past 6 months? No Has the patient been a risk to others within the distant past? No  [place] Scale:  Flowsheet Row ED from 03/31/2022 in [facility] ED from 01/31/2022 in [facility]  EMERGENCY DEPARTMENT ED from 01/06/2022 in [facility] EMERGENCY DEPARTMENT  C-SSRS RISK CATEGORY High Risk No Risk No Risk       AIMS:  , , ,  ,   ASAM:    Substance Abuse:     Past Medical History:  Past Medical History:  Diagnosis Date   Cholecystitis 08/18/2011   Headache(784.0)    otc med prn   History of recurrent UTIs    recurrent UTIs   Hypertension    Hyperthyroidism    Joint pain    knees and shoulders - otc med prn   Sickle cell trait (HCC)    Sickle cell trait (HCC)    SVD (spontaneous vaginal delivery)    x 3 [facility]    Past Surgical History:  Procedure Laterality Date   CHOLECYSTECTOMY  08/19/2011   Procedure: LAPAROSCOPIC CHOLECYSTECTOMY WITH INTRAOPERATIVE CHOLANGIOGRAM;  Surgeon: Lodema Pilot, DO;  Location: [facility] OR;  Service: General;  Laterality: N/A;   DILATION AND CURETTAGE OF UTERUS     molar preg   LAPAROSCOPIC BILATERAL SALPINGECTOMY Bilateral 05/09/2013   Procedure: LAPAROSCOPIC BILATERAL SALPINGECTOMY;  Surgeon: Allie Bossier, MD;  Location: [facility] ORS;  Service: Gynecology;  Laterality: Bilateral;   Family History:  Family History  Problem Relation Age of Onset   Diabetes Mother    Cancer Father    Diabetes Maternal Uncle    Cancer Maternal Grandmother    Cancer Maternal Grandfather    Hypertension Maternal Grandfather    Diabetes Maternal Grandfather    Sickle cell anemia Son    Asthma Son    Heart disease Paternal Grandfather    Hearing loss Paternal Aunt     Social History:  Social History   Substance and Sexual Activity  Alcohol Use No     Social History   Substance and Sexual Activity  Drug Use Yes   Types: Marijuana   Comment: occasional    Social History   Socioeconomic History   Marital status: Single    Spouse name: Not on file   Number of children: Not on file   Years of education: Not on file   Highest education level: Not on file  Occupational History   Not on file  Tobacco Use   Smoking status: Never    Smokeless tobacco: Never  Vaping Use   Vaping Use: Never used  Substance and Sexual Activity   Alcohol use: No   Drug use: Yes    Types: Marijuana    Comment: occasional   Sexual activity: Yes    Birth control/protection: Surgical  Other Topics Concern   Not on file  Social History Narrative   2 Adults, 4 children at home   FOB involved   Social Determinants of Health   Financial Resource Strain: Not on file  Food Insecurity: Not on file  Transportation Needs: Not on file  Physical Activity: Not on file  Stress: Not on file  Social Connections: Not on file     Allergies:  No Known Allergies  Labs:  Results for  orders placed or performed during the hospital encounter of 03/31/22 (from the past 48 hour(s))  Rapid urine drug screen (hospital performed)     Status: Abnormal   Collection Time: 03/31/22  9:22 PM  Result Value Ref Range   Opiates NONE DETECTED NONE DETECTED   Cocaine NONE DETECTED NONE DETECTED   Benzodiazepines NONE DETECTED NONE DETECTED   Amphetamines POSITIVE (A) NONE DETECTED   Tetrahydrocannabinol POSITIVE (A) NONE DETECTED   Barbiturates NONE DETECTED NONE DETECTED    Comment: (NOTE) DRUG SCREEN FOR MEDICAL PURPOSES ONLY.  IF CONFIRMATION IS NEEDED FOR ANY PURPOSE, NOTIFY LAB WITHIN 5 DAYS.  LOWEST DETECTABLE LIMITS FOR URINE DRUG SCREEN Drug Class                     Cutoff (ng/mL) Amphetamine and metabolites    1000 Barbiturate and metabolites    200 Benzodiazepine                 200 Opiates and metabolites        300 Cocaine and metabolites        300 THC                            50 Performed at [facility], [address] [place], [place] [address]   Urinalysis, Routine w reflex microscopic     Status: Abnormal   Collection Time: 03/31/22  9:22 PM  Result Value Ref Range   Color, Urine YELLOW YELLOW   APPearance HAZY (A) CLEAR   Specific Gravity, Urine 1.011 1.005 - 1.030   pH 5.0 5.0 - 8.0   Glucose, UA  NEGATIVE NEGATIVE mg/dL   Hgb urine dipstick SMALL (A) NEGATIVE   Bilirubin Urine NEGATIVE NEGATIVE   Ketones, ur NEGATIVE NEGATIVE mg/dL   Protein, ur NEGATIVE NEGATIVE mg/dL   Nitrite POSITIVE (A) NEGATIVE   Leukocytes,Ua SMALL (A) NEGATIVE   RBC / HPF 0-5 0 - 5 RBC/hpf   WBC, UA 11-20 0 - 5 WBC/hpf   Bacteria, UA MANY (A) NONE SEEN   Squamous Epithelial / LPF 0-5 0 - 5   Mucus PRESENT     Comment: Performed at [facility], [address] [place], [place] [address]  CBC with Differential     Status: Abnormal   Collection Time: 03/31/22  9:33 PM  Result Value Ref Range   WBC 9.4 4.0 - 10.5 K/uL   RBC 3.71 (L) 3.87 - 5.11 MIL/uL   Hemoglobin 12.2 12.0 - 15.0 g/dL   HCT 01.7 (L) 51.0 - 25.8 %   MCV 94.1 80.0 - 100.0 fL   MCH 32.9 26.0 - 34.0 pg   MCHC 35.0 30.0 - 36.0 g/dL   RDW 52.7 78.2 - 42.3 %   Platelets 347 150 - 400 K/uL   nRBC 0.0 0.0 - 0.2 %   Neutrophils Relative % 58 %   Neutro Abs 5.4 1.7 - 7.7 K/uL   Lymphocytes Relative 32 %   Lymphs Abs 3.0 0.7 - 4.0 K/uL   Monocytes Relative 9 %   Monocytes Absolute 0.8 0.1 - 1.0 K/uL   Eosinophils Relative 1 %   Eosinophils Absolute 0.1 0.0 - 0.5 K/uL   Basophils Relative 0 %   Basophils Absolute 0.0 0.0 - 0.1 K/uL   Immature Granulocytes 0 %   Abs Immature Granulocytes 0.02 0.00 - 0.07 K/uL    Comment: Performed at [facility], [address] [place], [place] [address]  Comprehensive metabolic panel     Status: Abnormal   Collection Time: 03/31/22  9:33 PM  Result Value Ref Range   Sodium 142 135 - 145 mmol/L   Potassium 3.8 3.5 - 5.1 mmol/L   Chloride 108 98 - 111 mmol/L   CO2 26 22 - 32 mmol/L   Glucose, Bld 123 (H) 70 - 99 mg/dL    Comment: Glucose reference range applies only to samples taken after fasting for at least 8 hours.   BUN 14 6 - 20 mg/dL   Creatinine, Ser 6.040.88 0.44 - 1.00 mg/dL   Calcium 9.3 8.9 - 54.010.3 mg/dL   Total Protein 7.5 6.5 - 8.1 g/dL   Albumin 4.1  3.5 - 5.0 g/dL   AST 21 15 - 41 U/L   ALT 16 0 - 44 U/L   Alkaline Phosphatase 38 38 - 126 U/L   Total Bilirubin 0.3 0.3 - 1.2 mg/dL   GFR, Estimated >98>60 >11>60 mL/min    Comment: (NOTE) Calculated using the CKD-EPI Creatinine Equation (2021)    Anion gap 8 5 - 15    Comment: Performed at [facility], [address] [place], [place] [address]  Ethanol     Status: None   Collection Time: 03/31/22  9:33 PM  Result Value Ref Range   Alcohol, Ethyl (B) <10 <10 mg/dL    Comment: (NOTE) Lowest detectable limit for serum alcohol is 10 mg/dL.  For medical purposes only. Performed at [facility], [address] [place], [place] [address]   Acetaminophen level     Status: Abnormal   Collection Time: 03/31/22  9:33 PM  Result Value Ref Range   Acetaminophen (Tylenol), Serum <10 (L) 10 - 30 ug/mL    Comment: (NOTE) Therapeutic concentrations vary significantly. A range of 10-30 ug/mL  may be an effective concentration for many patients. However, some  are best treated at concentrations outside of this range. Acetaminophen concentrations >150 ug/mL at 4 hours after ingestion  and >50 ug/mL at 12 hours after ingestion are often associated with  toxic reactions.  Performed at [facility], [address] [place], [place] [address]   Salicylate level     Status: Abnormal   Collection Time: 03/31/22  9:33 PM  Result Value Ref Range   Salicylate Lvl <7.0 (L) 7.0 - 30.0 mg/dL    Comment: Performed at [facility], [address] [place], [place] [address]    Current Facility-Administered Medications  Medication Dose Route Frequency Provider Last Rate Last Admin   cephALEXin (KEFLEX) capsule 500 mg  500 mg Oral Q8H Rancour, Stephen, MD   500 mg at 04/01/22 [id number]   citalopram (CELEXA) tablet 10 mg  10 mg Oral Daily Rancour, Stephen, MD   10 mg at 04/01/22 1049   metoprolol tartrate (LOPRESSOR) tablet 12.5 mg   12.5 mg Oral BID Rancour, Jeannett SeniorStephen, MD   12.5 mg at 04/01/22 1049   QUEtiapine (SEROQUEL) tablet 50 mg  50 mg Oral QHS Rancour, Jeannett SeniorStephen, MD   50 mg at 03/31/22 2321   Current Outpatient Medications  Medication Sig Dispense Refill   citalopram (CELEXA) 10 MG tablet Take 1 tablet (10 mg total) by mouth daily. 30 tablet 1   hydrOXYzine (ATARAX) 25 MG tablet Take 1 tablet (25 mg total) by mouth every 8 (eight) hours as needed for anxiety. 30 tablet 1   metoprolol tartrate (  LOPRESSOR) 25 MG tablet Take 0.5 tablets (12.5 mg total) by mouth 2 (two) times daily. (Patient not taking: Reported on 01/07/2022) 30 tablet 0   QUEtiapine (SEROQUEL) 50 MG tablet Take 1 tablet (50 mg total) by mouth at bedtime. 30 tablet 1    Musculoskeletal: Strength & Muscle Tone: within normal limits Gait & Station: normal Patient leans: N/A   Psychiatric Specialty Exam: Presentation  General Appearance:  Appropriate for Environment  Eye Contact: Good  Speech: Normal Rate  Speech Volume: Normal  Handedness: Right   Mood and Affect  Mood: Depressed; Hopeless  Affect: Appropriate   Thought Process  Thought Processes: Coherent  Descriptions of Associations:Intact  Orientation:Full (Time, Place and Person)  Thought Content:WDL  History of Schizophrenia/Schizoaffective disorder:No data recorded Duration of Psychotic Symptoms:No data recorded Hallucinations:Hallucinations: Visual Description of Visual Hallucinations: sometimes she states she sees things that are not there  Ideas of Reference:None  Suicidal Thoughts:Suicidal Thoughts: Yes, Passive SI Passive Intent and/or Plan: Without Intent  Homicidal Thoughts:Homicidal Thoughts: No   Sensorium  Memory: Immediate Good; Remote Good  Judgment: Fair  Insight: Fair   Art therapist  Concentration: Good  Attention Span: Good  Recall: Good  Fund of Knowledge: Good  Language: Good   Psychomotor Activity   Psychomotor Activity: Psychomotor Activity: Normal   Assets  Assets: Communication Skills; Housing    Sleep  Sleep: Sleep: Poor   Physical Exam: Physical Exam Constitutional:      Appearance: Normal appearance.  HENT:     Head: Normocephalic.     Nose: Nose normal.  Cardiovascular:     Rate and Rhythm: Normal rate.  Pulmonary:     Effort: Pulmonary effort is normal.  Musculoskeletal:        General: Normal range of motion.  Neurological:     Mental Status: She is alert and oriented to person, place, and time.  Psychiatric:        Attention and Perception: Attention normal.        Mood and Affect: Mood and affect normal.        Speech: Speech normal.        Behavior: Behavior is cooperative.        Cognition and Memory: Cognition normal.    Review of Systems  Constitutional: Negative.   Respiratory: Negative.    Gastrointestinal: Negative.   Musculoskeletal: Negative.   Skin: Negative.   Psychiatric/Behavioral:  Positive for depression and suicidal ideas.    Blood pressure 99/75, pulse 86, temperature (!) 97.4 F (36.3 C), temperature source Oral, resp. rate 16, SpO2 98 %. There is no height or weight on file to calculate BMI.  Medical Decision Making: Grace [name] is a 36 y.o. female with suicidal thoughts. States symptoms have been worsening over the past several weeks. She is not prescribed anything for depression or anxiety currently but has been in the past. States she intentionally took 5 tablets of Advil PM approximately 2 PM and has plan to take more.  Patient says she has increased her marijuana usage this month and alcohol consumption this month, she has been taking "shots" of liquor until she passes out. She denied any other drug use, but UDS was positive for amphetamines and when discussed with patient she was unsure of how she was positive for amphetamines. She endorses self-harm, "not really a plan", and she endorses visual hallucinations,  stating she sometimes sees things that are not there. Denies HI/AH. States "if I leave the emergency room and go  home, it wont be good", she was not able to elaborate.   Problem 1: Depression: Will continue patient home medications of Celexa and Seroquel and will talk with social work to find her an inpatient bed.   Disposition: Recommend psychiatric Inpatient admission when medically cleared.  Ebrima Ranta MOTLEY-MANGRUM, PMHNP 04/01/2022 11:57 AM

## 2022-04-01 NOTE — Progress Notes (Signed)
Per Alona Bene, PMHNP, pt is recommended for inpatient treatment. Pt is under review at [facility]. CSW will continue to assist and follow with placement.  Cathie Beams, [place]  04/01/2022 3:03 PM

## 2022-04-01 NOTE — ED Provider Notes (Signed)
Emergency Medicine Observation Re-evaluation Note  Grace [name] is a 36 y.o. female, seen on rounds today.  Pt initially presented to the ED for complaints of Suicidal Currently, the patient is resting quietly.  Physical Exam  BP 99/75   Pulse 86   Temp (!) 97.4 F (36.3 C) (Oral)   Resp 16   SpO2 98%  Physical Exam General: No acute distress Cardiac: Well-perfused Lungs: Nonlabored Psych: Cooperative  ED Course / MDM  EKG:EKG Interpretation  Date/Time:  Wednesday March 31 2022 22:31:25 EST Ventricular Rate:  78 PR Interval:  150 QRS Duration: 82 QT Interval:  376 QTC Calculation: 429 R Axis:   30 Text Interpretation: Sinus rhythm Abnormal R-wave progression, early transition No significant change was found Confirmed by Glynn Octave [phone]) on 03/31/2022 10:37:15 PM  I have reviewed the labs performed to date as well as medications administered while in observation.  Recent changes in the last 24 hours include assessment.  Plan  Current plan is for psychiatric reassessment this morning.    Terrilee Files, MD 04/01/22 2035

## 2022-04-01 NOTE — ED Notes (Signed)
Condition stable recent vital signs WNL affect flat mood labile but cooperative voluntary signed by pt at this time along with necessity to transport form sent with pt via Training and development officer. Pt ambulatory with steady gait wearing burgundy scrubs.

## 2022-04-01 NOTE — BH Assessment (Addendum)
TTS clinician attempted to complete TTS assessment. Per Tiffany, patient is asleep and was medicated. TTS will attempt at later time.

## 2022-04-02 ENCOUNTER — Encounter (HOSPITAL_COMMUNITY): Payer: Self-pay

## 2022-04-02 DIAGNOSIS — Z789 Other specified health status: Secondary | ICD-10-CM | POA: Insufficient documentation

## 2022-04-02 DIAGNOSIS — F411 Generalized anxiety disorder: Secondary | ICD-10-CM | POA: Insufficient documentation

## 2022-04-02 DIAGNOSIS — N390 Urinary tract infection, site not specified: Secondary | ICD-10-CM | POA: Insufficient documentation

## 2022-04-02 DIAGNOSIS — F333 Major depressive disorder, recurrent, severe with psychotic symptoms: Secondary | ICD-10-CM | POA: Diagnosis not present

## 2022-04-02 DIAGNOSIS — F1290 Cannabis use, unspecified, uncomplicated: Secondary | ICD-10-CM | POA: Insufficient documentation

## 2022-04-02 LAB — TSH: TSH: 1.583 u[IU]/mL (ref 0.350–4.500)

## 2022-04-02 LAB — LIPID PANEL
Cholesterol: 145 mg/dL (ref 0–200)
HDL: 46 mg/dL (ref >40)
LDL Cholesterol: 74 mg/dL (ref 0–99)
Total CHOL/HDL Ratio: 3.2 RATIO
Triglycerides: 126 mg/dL (ref <150)
VLDL: 25 mg/dL (ref 0–40)

## 2022-04-02 LAB — SARS CORONAVIRUS 2 BY RT PCR: SARS Coronavirus 2 by RT PCR: NEGATIVE

## 2022-04-02 MED ORDER — HYDROXYZINE HCL 25 MG PO TABS: 25.0000 mg | ORAL_TABLET | Freq: Four times a day (QID) | ORAL | Status: DC | PRN

## 2022-04-02 MED ORDER — LORAZEPAM 1 MG PO TABS: 1.0000 mg | ORAL_TABLET | Freq: Four times a day (QID) | ORAL | Status: AC | PRN

## 2022-04-02 MED ORDER — QUETIAPINE FUMARATE 50 MG PO TABS
50.0000 mg | ORAL_TABLET | Freq: Every evening | ORAL | Status: DC | PRN
  Administered 2022-04-07: 50 mg via ORAL
  Filled 2022-04-02: qty 1

## 2022-04-02 MED ORDER — VITAMIN B-1 100 MG PO TABS
100.0000 mg | ORAL_TABLET | Freq: Every day | ORAL | Status: DC
  Administered 2022-04-03 – 2022-04-13 (×11): 100 mg via ORAL
  Filled 2022-04-02 (×13): qty 1

## 2022-04-02 MED ORDER — ONDANSETRON 4 MG PO TBDP: 4.0000 mg | ORAL_TABLET | Freq: Four times a day (QID) | ORAL | Status: AC | PRN

## 2022-04-02 MED ORDER — LOPERAMIDE HCL 2 MG PO CAPS: 2.0000 mg | ORAL_CAPSULE | ORAL | Status: AC | PRN

## 2022-04-02 MED ORDER — ADULT MULTIVITAMIN W/MINERALS CH
1.0000 | ORAL_TABLET | Freq: Every day | ORAL | Status: DC
  Administered 2022-04-02 – 2022-04-13 (×12): 1 via ORAL
  Filled 2022-04-02 (×15): qty 1

## 2022-04-02 NOTE — BHH Group Notes (Signed)
Pt attended AA Group.  

## 2022-04-02 NOTE — BHH Group Notes (Signed)
B[facility] Group Notes:  (Nursing)  Date:  04/02/2022  Time:  1300  Type of Therapy:  Psychoeducational Skills  Participation Level:  Minimal  Participation Quality:  Attentive  Affect:  Appropriate  Cognitive:  Appropriate  Insight:  Limited  Engagement in Group:  Limited  Modes of Intervention:  Discussion, Exploration, Rapport Building, Socialization, and Support  Summary of Progress/Problems:  Grace [name] 04/02/2022, 4:04 PM

## 2022-04-02 NOTE — Progress Notes (Signed)
Patient A&Ox4. Patient denies HI and AVH but reports having passive SI with no plan. Patient denies any physical complaints when asked. No acute distress noted. Support and encouragement provided. Routine safety checks conducted according to facility protocol. Encouraged patient to notify staff if thoughts of harm toward self or others arise. Patient verbalize understanding and agreement. Will continue to monitor for safety.

## 2022-04-02 NOTE — BHH Suicide Risk Assessment (Signed)
[facility] INPATIENT:  Family/Significant Other Suicide Prevention Education  Suicide Prevention Education:  Education Completed; Philis Nettle [phone] (Sister) has been identified by the patient as the family member/significant other with whom the patient will be residing, and identified as the person(s) who will aid the patient in the event of a mental health crisis (suicidal ideations/suicide attempt).  With written consent from the patient, the family member/significant other has been provided the following suicide prevention education, prior to the and/or following the discharge of the patient.  The suicide prevention education provided includes the following: Suicide risk factors Suicide prevention and interventions National Suicide Hotline telephone number [facility] assessment telephone number [facility] Emergency Assistance 911 [facility] and/or Residential Mobile Crisis Unit telephone number  Request made of family/significant other to: Remove weapons (e.g., guns, rifles, knives), all items previously/currently identified as safety concern.   Remove drugs/medications (over-the-counter, prescriptions, illicit drugs), all items previously/currently identified as a safety concern.  The family member/significant other verbalizes understanding of the suicide prevention education information provided.  The family member/significant other agrees to remove the items of safety concern listed above.  CSW spoke with Ms. Mayford Knife who states that she is not sure what brought her sister to the hospital.  She states "She don't talk to me like that.  She don't tell me anything".  She states that her sister is often distant with her family members and "only tells [place] bits and pieces about what is going on with her".  She states that her sister has been depressed in the past and stated "I may do something crazy".  She believes that her sister is talking about suicidal thoughts when  she makes that statement.  Ms. Mayford Knife states that her sister has also "had herself committed 2 times before for depression and suicidal thoughts".  Ms. Mayford Knife confirms that her sister lives with her and can return to the home after discharge.  She states that there are no firearms or weapons in the home.  CSW completed SPE with Ms. Williams.   Metro Kung Kaliegh Willadsen 04/02/2022, 10:36 AM
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
## 2022-04-02 NOTE — [facility] Suicide Risk Assessment (Signed)
Suicide Risk Assessment  Admission Assessment    [facility] Admission Suicide Risk Assessment   Nursing information obtained from:  Patient Demographic factors:  Adolescent or young adult, Low socioeconomic status Current Mental Status:  Suicidal ideation indicated by patient Loss Factors:  Loss of significant relationship, Financial problems / change in socioeconomic status Historical Factors:  Anniversary of important loss Risk Reduction Factors:  Positive social support, Living with another person, especially a relative  Total Time spent with patient: 1.5 hours Principal Problem: Severe episode of recurrent major depressive disorder, with psychotic features (HCC) Diagnosis:  Principal Problem:   Severe episode of recurrent major depressive disorder, with psychotic features (HCC) Active Problems:   UTI (urinary tract infection)   Alcohol use   Cannabis use disorder   GAD (generalized anxiety disorder)  Reason For Admission: Grace [name] is a 36 yo AAF with a history of MDD & THC abuse who presented to the [facility] after taking 5 tabs of Advil with an intent to end her life in the context of multiple stressors. Pt was medically stabilized and transferred to this [facility] voluntarily for treatment and stabilization of her mood   Continued Clinical Symptoms: Depressive symptoms and psychotic features requiring continuous treatment and stabilization.  Alcohol Use Disorder Identification Test Final Score (AUDIT): 0 The "Alcohol Use Disorders Identification Test", Guidelines for Use in Primary Care, Second Edition.  World Science writer [facility]). Score between 0-7:  no or low risk or alcohol related problems. Score between 8-15:  moderate risk of alcohol related problems. Score between 16-19:  high risk of alcohol related problems. Score 20 or above:  warrants further diagnostic evaluation for alcohol dependence and treatment.  CLINICAL FACTORS:   Depression:   Anhedonia Comorbid  alcohol abuse/dependence Hopelessness Impulsivity Insomnia Recent sense of peace/wellbeing Severe  Musculoskeletal: Strength & Muscle Tone: within normal limits Gait & Station: normal Patient leans: N/A  Psychiatric Specialty Exam:  Presentation  General Appearance:  Appropriate for Environment  Eye Contact: Good  Speech: Clear and Coherent  Speech Volume: Normal  Handedness: Right   Mood and Affect  Mood: Anxious; Depressed  Affect: Congruent   Thought Process  Thought Processes: Coherent  Descriptions of Associations:Intact  Orientation:Full (Time, Place and Person)  Thought Content:Logical  History of Schizophrenia/Schizoaffective disorder:No data recorded Duration of Psychotic Symptoms:No data recorded Hallucinations:Hallucinations: Auditory Description of Visual Hallucinations: sometimes she states she sees things that are not there  Ideas of Reference:Paranoia  Suicidal Thoughts:Suicidal Thoughts: Yes, Active SI Active Intent and/or Plan: Without Intent; Without Plan SI Passive Intent and/or Plan: Without Intent  Homicidal Thoughts:Homicidal Thoughts: No   Sensorium  Memory: Immediate Good  Judgment: Fair  Insight: Fair   Chartered certified accountant: Fair  Attention Span: Fair  Recall: [employer] of Knowledge: Fair  Language: Fair   Psychomotor Activity  Psychomotor Activity: Psychomotor Activity: Normal   Assets  Assets: Resilience  Sleep  Sleep: Sleep: Poor  Physical Exam: Physical Exam Constitutional:      Appearance: Normal appearance.  HENT:     Head: Normocephalic.     Nose: Nose normal.  Eyes:     Pupils: Pupils are equal, round, and reactive to light.  Pulmonary:     Effort: Pulmonary effort is normal.  Musculoskeletal:        General: Normal range of motion.     Cervical back: Normal range of motion.  Neurological:     Mental Status: She is alert and oriented to person, place,  and time.    Review of Systems  Constitutional:  Negative for fever.  HENT: Negative.    Eyes: Negative.   Respiratory: Negative.    Cardiovascular: Negative.   Gastrointestinal: Negative.   Genitourinary: Negative.   Musculoskeletal: Negative.   Skin: Negative.   Neurological:  Negative for dizziness.  Psychiatric/Behavioral:  Positive for depression, hallucinations and substance abuse. Negative for memory loss and suicidal ideas. The patient is nervous/anxious and has insomnia.    Blood pressure 101/74, pulse 79, temperature 98.5 F (36.9 C), temperature source Oral, resp. rate 16, height 5\' 4"  (1.626 m), weight 63.5 kg, SpO2 100 %. Body mass index is 24.03 kg/m.   COGNITIVE FEATURES THAT CONTRIBUTE TO RISK:  None    SUICIDE RISK:    Moderate:  Frequent suicidal ideation with limited intensity, and duration, some specificity in terms of plans, no associated intent, good self-control, limited dysphoria/symptomatology, some risk factors present, and identifiable protective factors, including available and accessible social support.   PLAN OF CARE:  Treatment Plan Summary: Daily contact with patient to assess and evaluate symptoms and progress in treatment and Medication management   Observation Level/Precautions:  15 minute checks  Laboratory:  Labs reviewed   Psychotherapy:  Unit Group sessions  Medications:  See [facility]  Consultations:  To be determined   Discharge Concerns:  Safety, medication compliance, mood stability  Estimated LOS: 5-7 days  Other:  N/A    Labs Reviewed: TSH 1.583, Lipid panel WNL, HA1C pending, CMP mostly WNL with exception of glucose of 123, CBC WNL, Urine pregnancy test negative, UA with nitrites and Leukocytes and bacteria. Pt being treating for a UTI.   PLAN Safety and Monitoring: Voluntary admission to inpatient psychiatric unit for safety, stabilization and treatment Daily contact with patient to assess and evaluate symptoms and progress in  treatment Patient's case to be discussed in multi-disciplinary team meeting Observation Level : q15 minute checks Vital signs: q12 hours Precautions: Safety   Long Term Goal(s): Improvement in symptoms so as ready for discharge   Short Term Goals: Ability to identify changes in lifestyle to reduce recurrence of condition will improve, Ability to disclose and discuss suicidal ideas, Ability to demonstrate self-control will improve, Ability to identify and develop effective coping behaviors will improve, Compliance with prescribed medications will improve, and Ability to identify triggers associated with substance abuse/mental health issues will improve   Physician Treatment Plan for Secondary Diagnosis:  Principal Problem:   Severe episode of recurrent major depressive disorder, with psychotic features (HCC) Active Problems:   UTI (urinary tract infection)   Alcohol use   Cannabis use disorder   GAD (generalized anxiety disorder)   Medications (Pt educated on all medications, rationales, benefits and possible side effects, and agreeable to trials) -Continue Celexa 10 mg daily for depressive symptoms (started by admitting Provider) -Continue Seroquel 50 mg nightly for sleep/mood stabilization (Started by admitting provider) -Start Seroquel 50 mg nightly PRN for insomnia -Continue Lopressor 12.5 mg daily for htn -Continue Keflex 500 mg Q 8 H for UTI -Continue Hydroxyzine 25 mg every 6 hours PRN for anxiety -Start Ativan 1mg  tabs every 6 hours PRN for CIWA > 10   Other PRNS -Continue Tylenol 650 mg every 6 hours PRN for mild pain -Continue Maalox 30 mg every 4 hrs PRN for indigestion -Continue Imodium 2-4 mg as needed for diarrhea -Continue Milk of Magnesia as needed every 6 hrs for constipation -Continue Zofran disintegrating tabs every 6 hrs PRN for nausea  Discharge  Planning: Social work and case management to assist with discharge planning and identification of hospital follow-up  needs prior to discharge Estimated LOS: 5-7 days Discharge Concerns: Need to establish a safety plan; Medication compliance and effectiveness Discharge Goals: Return home with outpatient referrals for mental health follow-up including medication management/psychotherapy    I certify that inpatient services furnished can reasonably be expected to improve the patient's condition.   Starleen Blue, NP 04/02/2022, 4:35 PM

## 2022-04-02 NOTE — Tx Team (Signed)
Initial Treatment Plan 04/02/2022 1:03 AM Grace [name] [id number]    PATIENT STRESSORS: Financial difficulties   Loss of of father around this time     PATIENT STRENGTHS: Ability for insight  Average or above average intelligence  Communication skills  Supportive family/friends    PATIENT IDENTIFIED PROBLEMS: depression  anxiety                   DISCHARGE CRITERIA:  Improved stabilization in mood, thinking, and/or behavior Need for constant or close observation no longer present Reduction of life-threatening or endangering symptoms to within safe limits Verbal commitment to aftercare and medication compliance  PRELIMINARY DISCHARGE PLAN: Attend aftercare/continuing care group Return to previous living arrangement Return to previous work or school arrangements  PATIENT/FAMILY INVOLVEMENT: This treatment plan has been presented to and reviewed with the patient, Grace [name].  The patient has been given the opportunity to ask questions and make suggestions.  Lahoma Crocker, RN 04/02/2022, 1:03 AM

## 2022-04-02 NOTE — Plan of Care (Signed)
  Problem: Health Behavior/Discharge Planning: Goal: Identification of resources available to assist in meeting health care needs will improve Outcome: Progressing   Problem: Medication: Goal: Compliance with prescribed medication regimen will improve Outcome: Progressing   Problem: Education: Goal: Knowledge of  General Education information/materials will improve Outcome: Progressing Goal: Emotional status will improve Outcome: Progressing Goal: Mental status will improve Outcome: Progressing Goal: Verbalization of understanding the information provided will improve Outcome: Progressing
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
## 2022-04-02 NOTE — BH IP Treatment Plan (Signed)
Interdisciplinary Treatment and Diagnostic Plan Update  04/02/2022 Time of Session: 9:45 AM  Grace [name] MRN: [id number]  Principal Diagnosis: Severe episode of recurrent major depressive disorder, with psychotic features ([place])  Secondary Diagnoses: Principal Problem:   Severe episode of recurrent major depressive disorder, with psychotic features ([place]) Active Problems:   UTI (urinary tract infection)   Alcohol use   Cannabis use disorder   GAD (generalized anxiety disorder)   Current Medications:  Current Facility-Administered Medications  Medication Dose Route Frequency Provider Last Rate Last Admin   acetaminophen (TYLENOL) tablet 650 mg  650 mg Oral Q6H PRN Motley-Mangrum, Jadeka A, PMHNP       alum & mag hydroxide-simeth (MAALOX/MYLANTA) 200-200-20 MG/5ML suspension 30 mL  30 mL Oral Q4H PRN Motley-Mangrum, Jadeka A, PMHNP       cephALEXin (KEFLEX) capsule 500 mg  500 mg Oral Q8H Motley-Mangrum, Jadeka A, PMHNP   500 mg at 04/02/22 0644   citalopram (CELEXA) tablet 10 mg  10 mg Oral Daily Motley-Mangrum, Jadeka A, PMHNP   10 mg at 04/02/22 0753   hydrOXYzine (ATARAX) tablet 25 mg  25 mg Oral TID PRN Motley-Mangrum, Jadeka A, PMHNP   25 mg at 04/02/22 0755   loperamide (IMODIUM) capsule 2-4 mg  2-4 mg Oral PRN Massengill, Ovid Curd, MD       LORazepam (ATIVAN) tablet 1 mg  1 mg Oral Q6H PRN Massengill, Nathan, MD       magnesium hydroxide (MILK OF MAGNESIA) suspension 30 mL  30 mL Oral Daily PRN Motley-Mangrum, Jadeka A, PMHNP       metoprolol tartrate (LOPRESSOR) tablet 12.5 mg  12.5 mg Oral BID Motley-Mangrum, Jadeka A, PMHNP   12.5 mg at 04/02/22 1051   multivitamin with minerals tablet 1 tablet  1 tablet Oral Daily Massengill, Ovid Curd, MD       ondansetron (ZOFRAN-ODT) disintegrating tablet 4 mg  4 mg Oral Q6H PRN Massengill, Ovid Curd, MD       QUEtiapine (SEROQUEL) tablet 50 mg  50 mg Oral QHS Motley-Mangrum, Jadeka A, PMHNP   50 mg at 04/01/22 2152   QUEtiapine (SEROQUEL)  tablet 50 mg  50 mg Oral QHS PRN Massengill, Ovid Curd, MD       Derrill Memo ON 04/03/2022] thiamine (Vitamin B-1) tablet 100 mg  100 mg Oral Daily Massengill, Nathan, MD       PTA Medications: Medications Prior to Admission  Medication Sig Dispense Refill Last Dose   citalopram (CELEXA) 10 MG tablet Take 1 tablet (10 mg total) by mouth daily. (Patient not taking: Reported on 04/01/2022) 30 tablet 1    hydrOXYzine (ATARAX) 25 MG tablet Take 1 tablet (25 mg total) by mouth every 8 (eight) hours as needed for anxiety. (Patient not taking: Reported on 04/01/2022) 30 tablet 1    ibuprofen (ADVIL) 200 MG tablet Take 400 mg by mouth every 6 (six) hours as needed for mild pain or headache.      metoprolol tartrate (LOPRESSOR) 25 MG tablet Take 0.5 tablets (12.5 mg total) by mouth 2 (two) times daily. (Patient not taking: Reported on 04/01/2022) 30 tablet 0    QUEtiapine (SEROQUEL) 50 MG tablet Take 1 tablet (50 mg total) by mouth at bedtime. (Patient not taking: Reported on 04/01/2022) 30 tablet 1     Patient Stressors: Financial difficulties   Loss of of father around this time    Patient Strengths: Ability for insight  Average or above average intelligence  Communication skills  Supportive family/friends   Treatment  Modalities: Medication Management, Group therapy, Case management,  1 to 1 session with clinician, Psychoeducation, Recreational therapy.   Physician Treatment Plan for Primary Diagnosis: Severe episode of recurrent major depressive disorder, with psychotic features (HCC) Long Term Goal(s):     Short Term Goals:    Medication Management: Evaluate patient's response, side effects, and tolerance of medication regimen.  Therapeutic Interventions: 1 to 1 sessions, Unit Group sessions and Medication administration.  Evaluation of Outcomes: Not Progressing  Physician Treatment Plan for Secondary Diagnosis: Principal Problem:   Severe episode of recurrent major depressive disorder, with  psychotic features (HCC) Active Problems:   UTI (urinary tract infection)   Alcohol use   Cannabis use disorder   GAD (generalized anxiety disorder)  Long Term Goal(s):     Short Term Goals:       Medication Management: Evaluate patient's response, side effects, and tolerance of medication regimen.  Therapeutic Interventions: 1 to 1 sessions, Unit Group sessions and Medication administration.  Evaluation of Outcomes: Not Progressing   RN Treatment Plan for Primary Diagnosis: Severe episode of recurrent major depressive disorder, with psychotic features (HCC) Long Term Goal(s): Knowledge of disease and therapeutic regimen to maintain health will improve  Short Term Goals: Ability to remain free from injury will improve, Ability to verbalize frustration and anger appropriately will improve, Ability to demonstrate self-control, Ability to participate in decision making will improve, Ability to verbalize feelings will improve, Ability to disclose and discuss suicidal ideas, Ability to identify and develop effective coping behaviors will improve, and Compliance with prescribed medications will improve  Medication Management: RN will administer medications as ordered by provider, will assess and evaluate patient's response and provide education to patient for prescribed medication. RN will report any adverse and/or side effects to prescribing provider.  Therapeutic Interventions: 1 on 1 counseling sessions, Psychoeducation, Medication administration, Evaluate responses to treatment, Monitor vital signs and CBGs as ordered, Perform/monitor CIWA, COWS, AIMS and Fall Risk screenings as ordered, Perform wound care treatments as ordered.  Evaluation of Outcomes: Not Progressing   LCSW Treatment Plan for Primary Diagnosis: Severe episode of recurrent major depressive disorder, with psychotic features (HCC) Long Term Goal(s): Safe transition to appropriate next level of care at discharge, Engage  patient in therapeutic group addressing interpersonal concerns.  Short Term Goals: Engage patient in aftercare planning with referrals and resources, Increase social support, Increase ability to appropriately verbalize feelings, Increase emotional regulation, Facilitate acceptance of mental health diagnosis and concerns, Facilitate patient progression through stages of change regarding substance use diagnoses and concerns, Identify triggers associated with mental health/substance abuse issues, and Increase skills for wellness and recovery  Therapeutic Interventions: Assess for all discharge needs, 1 to 1 time with Social worker, Explore available resources and support systems, Assess for adequacy in community support network, Educate family and significant other(s) on suicide prevention, Complete Psychosocial Assessment, Interpersonal group therapy.  Evaluation of Outcomes: Not Progressing   Progress in Treatment: Attending groups: Yes. Participating in groups: Yes. Taking medication as prescribed: Yes. Toleration medication: Yes. Family/Significant other contact made: Yes, individual(s) contacted:  Philis Nettle [phone] (Sister)  Patient understands diagnosis: Yes. Discussing patient identified problems/goals with staff: Yes. Medical problems stabilized or resolved: Yes. Denies suicidal/homicidal ideation: Yes. Issues/concerns per patient self-inventory: No.   New problem(s) identified: No, Describe:  None Reported   New Short Term/Long Term Goal(s):medication stabilization, elimination of SI thoughts, development of comprehensive mental wellness plan.    Patient Goals:  " Better coping with thoughts and  become a Better Me, than I was"   Discharge Plan or Barriers: Patient recently admitted. CSW will continue to follow and assess for appropriate referrals and possible discharge planning.    Reason for Continuation of Hospitalization: Anxiety Depression Medication  stabilization Suicidal ideation  Estimated Length of Stay:5-7 days   Last 3 [place] Suicide Severity Risk Score: Flowsheet Row Ad[facility] (Current) [facility] from [facility]31/2022 [facility] from 01/31/2022 in [facility]MENT  C-SSRS RISK CATEGORY High Risk High Risk No Risk       Last PHQ 2/9 Scores:    06/11/2021    2:09 PM  Depression screen PHQ 2/9  Decreased Interest 1  Down, Depressed, Hopeless 1  PHQ - 2 Score 2  Altered sleeping 3  Tired, decreased energy 2  Change in appetite 1  Feeling bad or failure about yourself  3  Trouble concentrating 2  Moving slowly or fidgety/restless 0  Suicidal thoughts 1  PHQ-9 Score 14  Difficult doing work/chores Very difficult    Scribe for Treatment Team: Beather Arbour 04/02/2022 12:43 PM

## 2022-04-02 NOTE — Group Note (Signed)
Date:  04/02/2022 Time:  10:20 AM  Group Topic/Focus:  Goals Group:   The focus of this group is to help patients establish daily goals to achieve during treatment and discuss how the patient can incorporate goal setting into their daily lives to aide in recovery. Orientation:   The focus of this group is to educate the patient on the purpose and policies of crisis stabilization and provide a format to answer questions about their admission.  The group details unit policies and expectations of patients while admitted.    Participation Level:  Active  Participation Quality:  Appropriate  Affect:  Appropriate  Cognitive:  Appropriate  Insight: Appropriate  Engagement in Group:  Engaged  Modes of Intervention:  Discussion  Additional Comments:  Pt wans to work on discharge.  Jaquita Rector 04/02/2022, 10:20 AM

## 2022-04-03 DIAGNOSIS — F333 Major depressive disorder, recurrent, severe with psychotic symptoms: Secondary | ICD-10-CM | POA: Diagnosis not present

## 2022-04-03 LAB — HEMOGLOBIN A1C
Hgb A1c MFr Bld: 5.1 % (ref 4.8–5.6)
Mean Plasma Glucose: 100 mg/dL

## 2022-04-03 MED ORDER — QUETIAPINE FUMARATE 100 MG PO TABS
100.0000 mg | ORAL_TABLET | Freq: Every day | ORAL | Status: DC
  Administered 2022-04-03 – 2022-04-04 (×2): 100 mg via ORAL
  Filled 2022-04-03 (×4): qty 1

## 2022-04-03 NOTE — Progress Notes (Signed)
   04/03/22 1100  Psych Admission Type (Psych Patients Only)  Admission Status Voluntary  Psychosocial Assessment  Patient Complaints Anxiety  Eye Contact Fair  Facial Expression Wide-eyed  Affect Anxious;Appropriate to circumstance  Speech Logical/coherent  Interaction Assertive  Motor Activity Other (Comment) (WNL)  Appearance/Hygiene Layered clothes  Behavior Characteristics Cooperative;Appropriate to situation  Mood Anxious;Pleasant  Aggressive Behavior  Effect No apparent injury  Thought Process  Coherency Circumstantial  Content WDL  Delusions WDL  Perception Depersonalization  Hallucination None reported or observed  Judgment Limited  Confusion None  Danger to Self  Current suicidal ideation? Passive  Agreement Not to Harm Self Yes  Description of Agreement verbal  Danger to Others  Danger to Others None reported or observed

## 2022-04-03 NOTE — [facility] Group Notes (Signed)
[facility] Group Notes:  (Nursing/MHT/Case Management/Adjunct)  Date:  04/03/2022  Time:  12:08 PM  Type of Therapy:  Psychoeducational Skills  Participation Level:  Active  Participation Quality:  Appropriate, Attentive, Sharing, and Supportive  Affect:  Anxious, Appropriate, and Depressed  Cognitive:  Alert, Appropriate, and Oriented  Insight:  Appropriate, Good, and Improving  Engagement in Group:  Engaged, Improving, and Supportive  Modes of Intervention:  Discussion, Education, Problem-solving, Rapport Building, Socialization, and Support  Summary of Progress/Problems: The group was encouraged to think about having a magic wand to fix one major problem/obstacle in their lives and how this wish related to hospitalization and current goal.   Pt shared that there were multiple stressors that led her to this hospitalization. She was able to single out isolation and sadness as something she wishes she didn't have. A lot of this started when my father died, things changed for me after. I have lost a lot and don't have any friends.  Karren Burly 04/03/2022, 12:08 PM

## 2022-04-03 NOTE — [facility] Group Notes (Signed)
Psychoeducational Group Note    Date:  04/03/2022 Time:1300-1400    Purpose of Group: . The group focus' on teaching patients on how to identify their needs and their Life Skills:  A group where two lists are made. What people need and what are things that we do that are unhealthy. The lists are developed by the patients and it is explained that we often do the actions that are not healthy to get our list of needs met.  Goal:: to develop the coping skills needed to get their needs met  Participation Level:  medium activity.  Participation Quality:  Appropriate  Affect:  Appropriate  Cognitive:  Oriented  Insight: Improving  Engagement in Group:  Engaged  Modes of Intervention:  Activity, Discussion, Education, and Support  Additional Comments:  Rates her energy at a 3/10. Partivcipated in the group.  Dione Housekeeper

## 2022-04-03 NOTE — Group Note (Signed)
LCSW Group Therapy Note  04/03/2022      Type of Therapy and Topic:  Group Therapy: Gratitude   Description:   Group could not be held by social worker, but licensed RN did provide group.  A handout was given to each patient, with the following information:   Gratitude  "Acknowledging the good that you already have in your life is the foundation for all abundance." - Eckhart Tolle  " 'Enough' is a feast." - Buddhist Proverb  "Gratitude sweetens even the smallest moments."  "It is not joy that makes us grateful; It is gratitude that makes us joyful." - David Steindl-Rast    Put at least one response under each category of something for which you are grateful:  People:  Experiences:  Things:  Places:  Skills:  Other:  Add more responses as you get ideas from other people.   Therapeutic Modalities:   Activity  Eeshan Verbrugge J Grossman-Orr, LCSW  

## 2022-04-03 NOTE — Progress Notes (Cosign Needed Addendum)
[facility] MD Progress Note  04/03/2022 5:27 PM Willer QUINTA BAUTZ  MRN:  [id number]  Subjective: Oneka Opheim states," I still feel suicidal, I want to go to sleep and never wake up."  Reason for admission:Akera M. Tora Perches is a 36 yo AAF with a history of MDD & THC abuse who presented to the [facility] after taking 5 tabs of Advil with an intent to end her life in the context of multiple stressors. Pt was medically stabilized and transferred to this [facility] voluntarily for treatment and stabilization of her mood.   24 hours chart review: Vital signs reviewed all within normal limits.  Patient patient taking scheduled medication without complaint.  Attending therapeutic milieu and group activities.  Receiving Tylenol x 1 RN for mild pain, Mylanta for indigestion x 1 and hydroxyzine x 3 doses for anxiety.  Today's assessment: Present alert and oriented x 4.  Speech clear, coherent with normal volume and pattern.  Reports increased depressive symptoms and rates as 10/10 on a scale of 0-10 with 10 being the worst.  Reports sleeping for only 3 hours/night.  Reports increase anxiety and rated as 10/10 with 10 being the worst.  Reports hearing TV in her room today and seeing some figures.  Seroquel increased to 100 mg p.o. q. nightly.  Instructions on therapeutic and side effect of medication shared with the patient with full understanding.  Will monitor the effect.  Patient reports decreased motivation and decreased energy.  Encouraged to attend therapeutic milieu and group activities in the unit.  Continues to endorse suicidal ideation without any plans, and unable to contract for safety at this time.  Safety measures and monitoring continues every 15 minutes.  Denies HI at this time.  Encouraged patient to continue taking her prescribed psychotropic medications and will monitor effects in the morning.  Complaining of GI indigestion Mylanta p.o. as needed administered by nursing staff with some relief. Routine  labs reviewed without critical values.  Patient continues on Keflex as scheduled for UTI.  Urine toxicology remain positive for amphetamine and tetrahydrocannabinol.  Instructions provided on cessation of polysubstance uses as they adversely affect overall psychiatric and medical wellbeing.   Principal Problem: Severe episode of recurrent major depressive disorder, with psychotic features ([place])  Diagnosis: Principal Problem:   Severe episode of recurrent major depressive disorder, with psychotic features ([place]) Active Problems:   UTI (urinary tract infection)   Alcohol use   Cannabis use disorder   GAD (generalized anxiety disorder)  Total Time spent with patient: 30 minutes  Past Psychiatric History: Previous Psych Diagnoses: MDD, GAD Prior inpatient treatment: Here at [facility] on 03/16/2015, 01/05/2016, 06/30/2019 Current/prior outpatient treatment: Non at this time Prior rehab hx: None Psychotherapy [id number] History of suicide attempts: 06/2019 leading to hospitalization here at that time History of homicide or aggression: Denies   Home [id number] 0.5 mg BID, Tapazole 10 mg BID Prior Hosp: Just for childbirth per pt Prior Surgeries/Trauma: Denies  Head trauma, LOC, concussions, seizures: Denies Allergies: Denies  LMP: 03/19/22 Contraception: Denies use  PCP: Denies having one  Past Medical History:  Medical Diagnoses: Hyperthyroidism Past Medical History:  Diagnosis Date   Cholecystitis 08/18/2011   Headache(784.0)    otc med prn   History of recurrent UTIs    recurrent UTIs   Hypertension    Hyperthyroidism    Joint pain    knees and shoulders - otc med prn   Sickle cell trait ([place])    Sickle cell trait ([place])  SVD (spontaneous vaginal delivery)    x 3 [place]    Past Surgical History:  Procedure Laterality Date   CHOLECYSTECTOMY  08/19/2011   Procedure: LAPAROSCOPIC CHOLECYSTECTOMY WITH INTRAOPERATIVE CHOLANGIOGRAM;  Surgeon: Madilyn Hook, DO;  Location: [place];   Service: General;  Laterality: N/A;   DILATION AND CURETTAGE OF UTERUS     molar preg   LAPAROSCOPIC BILATERAL SALPINGECTOMY Bilateral 05/09/2013   Procedure: LAPAROSCOPIC BILATERAL SALPINGECTOMY;  Surgeon: Emily Filbert, MD;  Location: [facility];  Service: Gynecology;  Laterality: Bilateral;   Family History:  Family History  Problem Relation Age of Onset   Diabetes Mother    Cancer Father    Diabetes Maternal Uncle    Cancer Maternal Grandmother    Cancer Maternal Grandfather    Hypertension Maternal Grandfather    Diabetes Maternal Grandfather    Sickle cell anemia Son    Asthma Son    Heart disease Paternal Grandfather    Hearing loss Paternal Aunt    Family Psychiatric  History: Psych: Mother with MDD, maternal aunt with bipolar d/o Psych Rx: Unsure SA/HA: Denies Substance use family hx: Denies     Social History:  Social History   Substance and Sexual Activity  Alcohol Use No     Social History   Substance and Sexual Activity  Drug Use Yes   Types: Marijuana   Comment: occasional    Social History   Socioeconomic History   Marital status: Single    Spouse name: Not on file   Number of children: Not on file   Years of education: Not on file   Highest education level: Not on file  Occupational History   Not on file  Tobacco Use   Smoking status: Never   Smokeless tobacco: Never  Vaping Use   Vaping Use: Never used  Substance and Sexual Activity   Alcohol use: No   Drug use: Yes    Types: Marijuana    Comment: occasional   Sexual activity: Yes    Birth control/protection: Surgical  Other Topics Concern   Not on file  Social History Narrative   2 Adults, 4 children at home   FOB involved   Social Determinants of Health   Financial Resource Strain: Not on file  Food Insecurity: No Food Insecurity (04/01/2022)   Hunger Vital Sign    Worried About Running Out of Food in the Last Year: Never true    Ran Out of Food in the Last Year: Never true   Transportation Needs: No Transportation Needs (04/01/2022)   PRAPARE - Hydrologist (Medical): No    Lack of Transportation (Non-Medical): No  Physical Activity: Not on file  Stress: Not on file  Social Connections: Not on file   Additional Social History:    Sleep: Fair  Appetite:  Good  Current Medications: Current Facility-Administered Medications  Medication Dose Route Frequency Provider Last Rate Last Admin   acetaminophen (TYLENOL) tablet 650 mg  650 mg Oral Q6H PRN Motley-Mangrum, Jadeka A, PMHNP   650 mg at 04/03/22 0959   alum & mag hydroxide-simeth (MAALOX/MYLANTA) 200-200-20 MG/5ML suspension 30 mL  30 mL Oral Q4H PRN Motley-Mangrum, Jadeka A, PMHNP   30 mL at 04/03/22 1256   cephALEXin (KEFLEX) capsule 500 mg  500 mg Oral Q8H Motley-Mangrum, Jadeka A, PMHNP   500 mg at 04/03/22 1447   citalopram (CELEXA) tablet 10 mg  10 mg Oral Daily Motley-Mangrum, Jadeka A, PMHNP   10  mg at 04/03/22 0755   hydrOXYzine (ATARAX) tablet 25 mg  25 mg Oral TID PRN Motley-Mangrum, Donneta Romberg A, PMHNP   25 mg at 04/03/22 1256   loperamide (IMODIUM) capsule 2-4 mg  2-4 mg Oral PRN Massengill, Ovid Curd, MD       LORazepam (ATIVAN) tablet 1 mg  1 mg Oral Q6H PRN Massengill, Ovid Curd, MD       magnesium hydroxide (MILK OF MAGNESIA) suspension 30 mL  30 mL Oral Daily PRN Motley-Mangrum, Jadeka A, PMHNP       metoprolol tartrate (LOPRESSOR) tablet 12.5 mg  12.5 mg Oral BID Motley-Mangrum, Jadeka A, PMHNP   12.5 mg at 04/03/22 1701   multivitamin with minerals tablet 1 tablet  1 tablet Oral Daily Massengill, Ovid Curd, MD   1 tablet at 04/03/22 0755   ondansetron (ZOFRAN-ODT) disintegrating tablet 4 mg  4 mg Oral Q6H PRN Massengill, Ovid Curd, MD       QUEtiapine (SEROQUEL) tablet 100 mg  100 mg Oral QHS Zamir Staples C, FNP       QUEtiapine (SEROQUEL) tablet 50 mg  50 mg Oral QHS PRN Massengill, Nathan, MD       thiamine (Vitamin B-1) tablet 100 mg  100 mg Oral Daily Massengill, Nathan,  MD   100 mg at 04/03/22 [id number]    Lab Results:  Results for orders placed or performed during the hospital encounter of 04/01/22 (from the past 48 hour(s))  SARS Coronavirus 2 by RT PCR (hospital order, performed in [facility] hospital lab) *cepheid single result test* Anterior Nasal Swab     Status: None   Collection Time: 04/02/22  6:05 AM   Specimen: Anterior Nasal Swab  Result Value Ref Range   SARS Coronavirus 2 by RT PCR NEGATIVE NEGATIVE    Comment: (NOTE) SARS-CoV-2 target nucleic acids are NOT DETECTED.  The SARS-CoV-2 RNA is generally detectable in upper and lower respiratory specimens during the acute phase of infection. The lowest concentration of SARS-CoV-2 viral copies this assay can detect is 250 copies / mL. A negative result does not preclude SARS-CoV-2 infection and should not be used as the sole basis for treatment or other patient management decisions.  A negative result may occur with improper specimen collection / handling, submission of specimen other than nasopharyngeal swab, presence of viral mutation(s) within the areas targeted by this assay, and inadequate number of viral copies (<250 copies / mL). A negative result must be combined with clinical observations, patient history, and epidemiological information.  Fact Sheet for Patients:   https://www.patel.info/  Fact Sheet for Healthcare Providers: https://hall.com/  This test is not yet approved or  cleared by the [place] FDA and has been authorized for detection and/or diagnosis of SARS-CoV-2 by FDA under an Emergency Use Authorization (EUA).  This EUA will remain in effect (meaning this test can be used) for the duration of the COVID-19 declaration under Section 564(b)(1) of the Act, 21 U.S.C. section 360bbb-3(b)(1), unless the authorization is terminated or revoked sooner.  Performed at [facility], [place] [address], [place], [place] [address]   Hemoglobin A1c     Status: None   Collection Time: 04/02/22  6:42 AM  Result Value Ref Range   Hgb A1c MFr Bld 5.1 4.8 - 5.6 %    Comment: (NOTE)         Prediabetes: 5.7 - 6.4         Diabetes: >6.4         Glycemic control  for adults with diabetes: <7.0    Mean Plasma Glucose 100 mg/dL    Comment: (NOTE) Performed At: [facility] [place], [place] [id number] Rush Farmer MD [id number]   Lipid panel     Status: None   Collection Time: 04/02/22  6:42 AM  Result Value Ref Range   Cholesterol 145 0 - 200 mg/dL   Triglycerides 126 <150 mg/dL   HDL 46 >40 mg/dL   Total CHOL/HDL Ratio 3.2 RATIO   VLDL 25 0 - 40 mg/dL   LDL Cholesterol 74 0 - 99 mg/dL    Comment:        Total Cholesterol/HDL:CHD Risk Coronary Heart Disease Risk Table                     Men   Women  1/2 Average Risk   3.4   3.3  Average Risk       5.0   4.4  2 X Average Risk   9.6   7.1  3 X Average Risk  23.4   11.0        Use the calculated Patient Ratio above and the CHD Risk Table to determine the patient's CHD Risk.        ATP III CLASSIFICATION (LDL):  <100     mg/dL   Optimal  100-129  mg/dL   Near or Above                    Optimal  130-159  mg/dL   Borderline  160-189  mg/dL   High  >190     mg/dL   Very High Performed at [place] [address] [place], [place] [address]   TSH     Status: None   Collection Time: 04/02/22  6:42 AM  Result Value Ref Range   TSH 1.583 0.350 - 4.500 uIU/mL    Comment: Performed by a 3rd Generation assay with a functional sensitivity of <=0.01 uIU/mL. Performed at [facility], [place] [address] [place], [place] [address]     Blood Alcohol level:  Lab Results  Component Value Date   ETH <10 03/31/2022   ETH <10 [id number]    Metabolic Disorder Labs: Lab Results  Component Value Date   HGBA1C 5.1 04/02/2022   MPG 100 04/02/2022   MPG 96.8 07/01/2019   Lab  Results  Component Value Date   PROLACTIN 35.2 (H) 01/09/2016   Lab Results  Component Value Date   CHOL 145 04/02/2022   TRIG 126 04/02/2022   HDL 46 04/02/2022   CHOLHDL 3.2 04/02/2022   VLDL 25 04/02/2022   LDLCALC 74 04/02/2022   LDLCALC 88 07/01/2019    Physical Findings: AIMS:  , ,  ,  ,    CIWA:  CIWA-Ar Total: 0 COWS:     Musculoskeletal: Strength & Muscle Tone: within normal limits Gait & Station: normal Patient leans: N/A  Psychiatric Specialty Exam:  Presentation  General Appearance:  Appropriate for Environment; Casual; Fairly Groomed  Eye Contact: Good  Speech: Clear and Coherent  Speech Volume: Normal  Handedness: Right   Mood and Affect  Mood: Anxious; Depressed  Affect: Congruent  Thought Process  Thought Processes: Coherent  Descriptions of Associations:Intact  Orientation:Full (Time, Place and Person)  Thought Content:Logical  History of Schizophrenia/Schizoaffective disorder:No data recorded Duration of Psychotic Symptoms:No data recorded Hallucinations:Hallucinations: Visual; Auditory Description of Auditory Hallucinations: Hearing TV playing in her room Description  of Visual Hallucinations: Seeing some figures  Ideas of Reference:Paranoia  Suicidal Thoughts:Suicidal Thoughts: Yes, Active SI Active Intent and/or Plan: Without Plan (Reports she cannot contract for safety) SI Passive Intent and/or Plan: -- (Denies passive SI)  Homicidal Thoughts:Homicidal Thoughts: No  Sensorium  Memory: Immediate Good  Judgment: Fair  Insight: Fair  Chartered certified accountant: Fair  Attention Span: Fair  Recall: Fair  Fund of Knowledge: Fair  Language: Good  Psychomotor Activity  Psychomotor Activity: Psychomotor Activity: Normal  Assets  Assets: Communication Skills; Physical Health  Sleep  Sleep: Sleep: Poor Number of Hours of Sleep: 3  Physical Exam: Physical Exam Vitals and nursing note  reviewed.  Constitutional:      Appearance: Normal appearance.  HENT:     Head: Normocephalic and atraumatic.     Nose: Nose normal.     Mouth/Throat:     Mouth: Mucous membranes are moist.     Pharynx: Oropharynx is clear.  Eyes:     Extraocular Movements: Extraocular movements intact.     Conjunctiva/sclera: Conjunctivae normal.     Pupils: Pupils are equal, round, and reactive to light.  Cardiovascular:     Rate and Rhythm: Normal rate.     Pulses: Normal pulses.  Pulmonary:     Effort: Pulmonary effort is normal.  Abdominal:     Palpations: Abdomen is soft.  Genitourinary:    Comments: Deferred Musculoskeletal:        General: Normal range of motion.     Cervical back: Normal range of motion.  Skin:    General: Skin is warm.  Neurological:     General: No focal deficit present.     Mental Status: She is alert and oriented to person, place, and time.  Psychiatric:        Mood and Affect: Mood normal.        Behavior: Behavior normal.    Review of Systems  Constitutional: Negative.   HENT: Negative.    Eyes: Negative.   Respiratory: Negative.    Cardiovascular: Negative.   Gastrointestinal: Negative.   Genitourinary: Negative.   Musculoskeletal: Negative.   Skin: Negative.   Endo/Heme/Allergies: Negative.   Psychiatric/Behavioral: Negative.     Blood pressure 107/70, pulse 82, temperature 98.4 F (36.9 C), temperature source Oral, resp. rate 16, height 5\' 4"  (1.626 m), weight 63.5 kg, SpO2 100 %. Body mass index is 24.03 kg/m.  Treatment Plan Summary: Daily contact with patient to assess and evaluate symptoms and progress in treatment and Medication management Observation Level/Precautions:  15 minute checks  Laboratory:  Labs reviewed   Psychotherapy:  Unit Group sessions  Medications:  See [facility]  Consultations:  To be determined   Discharge Concerns:  Safety, medication compliance, mood stability  Estimated LOS: 5-7 days  Other:  N/A    Labs Reviewed:  TSH 1.583, Lipid panel WNL, HA1C pending, CMP mostly WNL with exception of glucose of 123, CBC WNL, Urine pregnancy test negative, UA with nitrites and Leukocytes and bacteria. Pt being treating for a UTI.   PLAN Safety and Monitoring: Voluntary admission to inpatient psychiatric unit for safety, stabilization and treatment Daily contact with patient to assess and evaluate symptoms and progress in treatment Patient's case to be discussed in multi-disciplinary team meeting Observation Level : q15 minute checks Vital signs: q12 hours Precautions: Safety   Long Term Goal(s): Improvement in symptoms so as ready for discharge   Short Term Goals: Ability to identify changes in lifestyle to reduce recurrence  of condition will improve, Ability to disclose and discuss suicidal ideas, Ability to demonstrate self-control will improve, Ability to identify and develop effective coping behaviors will improve, Compliance with prescribed medications will improve, and Ability to identify triggers associated with substance abuse/mental health issues will improve   Physician Treatment Plan for Secondary Diagnosis:  Principal Problem:   Severe episode of recurrent major depressive disorder, with psychotic features ([place]) Active Problems:   UTI (urinary tract infection)   Alcohol use   Cannabis use disorder   GAD (generalized anxiety disorder)   Medications (Pt educated on all medications, rationales, benefits and possible side effects, and agreeable to trials) -Continue Celexa 10 mg daily for depressive symptoms (started by admitting Provider) -Increase Seroquel from 50 mg nightly to 100 mg p.o. q. nightly for sleep/mood stabilization today 04/03/2022.   -Continue Seroquel 50 mg PRN for insomnia -Continue Lopressor 12.5 mg daily for htn -Continue Keflex 500 mg Q 8 H for UTI -Continue Hydroxyzine 25 mg every 6 hours PRN for anxiety -Start Ativan 1mg  tabs every 6 hours PRN for CIWA > 10   Other  PRNS -Continue Tylenol 650 mg every 6 hours PRN for mild pain -Continue Maalox 30 mg every 4 hrs PRN for indigestion -Continue Imodium 2-4 mg as needed for diarrhea -Continue Milk of Magnesia as needed every 6 hrs for constipation -Continue Zofran disintegrating tabs every 6 hrs PRN for nausea  Discharge Planning: Social work and case management to assist with discharge planning and identification of hospital follow-up needs prior to discharge Estimated LOS: 5-7 days Discharge Concerns: Need to establish a safety plan; Medication compliance and effectiveness Discharge Goals: Return home with outpatient referrals for mental health follow-up including medication management/psychotherapy  Laretta Bolster, FNP 04/03/2022, 5:27 PM

## 2022-04-03 NOTE — B[facility] Group Notes (Signed)
Goals Group 04/03/22   Group Focus: affirmation, clarity of thought, and goals/reality orientation Treatment Modality:  Psychoeducation Interventions utilized were assignment, group exercise, and support Purpose: To be able to understand and verbalize the reason for their admission to the hospital. To understand that the medication helps with their chemical imbalance but they also need to work on their choices in life. To be challenged to develop a list of 30 positives about themselves. Also introduce the concept that "feelings" are not reality.  Participation Level:  Active  Participation Quality:  Appropriate  Affect:  Appropriate  Cognitive:  Appropriate  Insight:  Improving  Engagement in Group:  Engaged  Additional Comments:  Rates energy at a 5/10. Attended and interacted appropriately  Dione Housekeeper

## 2022-04-03 NOTE — Progress Notes (Signed)
The patient rated her day as a 5 out of 10 since she laughed today for the first time. Her goal for tomorrow is to stay out of her head.

## 2022-04-04 DIAGNOSIS — F333 Major depressive disorder, recurrent, severe with psychotic symptoms: Secondary | ICD-10-CM | POA: Diagnosis not present

## 2022-04-04 NOTE — B[facility] Group Notes (Signed)
Adult Psychoeducational Group Note Date:  04/04/2022 Time:  0900-1000 Group Topic/Focus: PROGRESSIVE RELAXATION. A group where deep breathing is taught and tensing and relaxation muscle groups is used. Imagery is used as well.  Pts are asked to imagine 3 pillars that hold them up when they are not able to hold themselves up and to share that with the group.   Participation Level:  Active  Participation Quality:  Appropriate  Affect:  Appropriate  Cognitive:  Approprate  Insight: Improving  Engagement in Group:  Engaged  Modes of Intervention:  deep breathing, Imagery. Discussion  Additional Comments:  States her energy is a 5/5. What holds her up is her children, music and her aunt.   : Grace [name]

## 2022-04-04 NOTE — Progress Notes (Signed)
[facility] MD Progress Note  04/04/2022 2:28 PM Grace [name]  MRN:  [id number]  Subjective: Grace [name] states," I feel okay today, I am attending all groups and the medications are helping me."  Reason for admission:Grace [name] is a 36 yo AAF with a history of MDD & THC abuse who presented to the [facility] after taking 5 tabs of Advil with an intent to end her life in the context of multiple stressors. Pt was medically stabilized and transferred to this [facility] voluntarily for treatment and stabilization of her mood.   24 hours chart review: Vital signs reviewed all within normal limits.  Patient patient taking scheduled medication without complaint.  Attending therapeutic milieu and group activities.  Receiving Tylenol x 1 RN for mild pain, Mylanta for indigestion x 2 and hydroxyzine x 3 doses for anxiety.  Today's assessment: On assessment today, present alert and oriented x 4.  Speech clear, coherent with normal volume and pattern.  Reports mood is less depressed.  Reported reduced anxiety and rates as 6/10, with 10 being the worst.  Reports sleeping for 5 hours/night which is great improvement from review of his night.  Denies auditory hallucination today, however reports seeing fly by figures.  Continues on Seroquel 100 mg p.o. q. nightly without any somatic discomfort.  Reinforces instructions on therapeutic and side effect of medication with the patient with full understanding.  Patient reports improved motivation and energy today.  Patient is actively attending therapeutic milieu and group activities in the unit.  Denies suicidal ideation and able to contract for safety today.  Safety measures and monitoring continues every 15 minutes.  Denies HI at this time.  Encouraged patient to continue taking her prescribed psychotropic medications and will monitor effects in the morning.  Routine labs reviewed without critical values.  Patient continues on Keflex as scheduled for UTI.  Urine  toxicology remain positive for amphetamine and tetrahydrocannabinol.  Instructions provided on cessation of polysubstance uses as they adversely affect overall psychiatric and medical wellbeing.  Principal Problem: Severe episode of recurrent major depressive disorder, with psychotic features (HCC)  Diagnosis: Principal Problem:   Severe episode of recurrent major depressive disorder, with psychotic features (HCC) Active Problems:   UTI (urinary tract infection)   Alcohol use   Cannabis use disorder   GAD (generalized anxiety disorder)  Total Time spent with patient: 30 minutes  Past Psychiatric History: Previous Psych Diagnoses: MDD, GAD Prior inpatient treatment: Here at [facility] on 03/16/2015, 01/05/2016, 06/30/2019 Current/prior outpatient treatment: Non at this time Prior rehab hx: None Psychotherapy BM:WUXL History of suicide attempts: 06/2019 leading to hospitalization here at that time History of homicide or aggression: Denies   Home KG:MWNUUVOZD 0.5 mg BID, Tapazole 10 mg BID Prior Hosp: Just for childbirth per pt Prior Surgeries/Trauma: Denies  Head trauma, LOC, concussions, seizures: Denies Allergies: Denies  LMP: 03/19/22 Contraception: Denies use  PCP: Denies having one  Past Medical History:  Medical Diagnoses: Hyperthyroidism Past Medical History:  Diagnosis Date   Cholecystitis 08/18/2011   Headache(784.0)    otc med prn   History of recurrent UTIs    recurrent UTIs   Hypertension    Hyperthyroidism    Joint pain    knees and shoulders - otc med prn   Sickle cell trait (HCC)    Sickle cell trait (HCC)    SVD (spontaneous vaginal delivery)    x 3 [facility]    Past Surgical History:  Procedure Laterality Date   CHOLECYSTECTOMY  08/19/2011   Procedure: LAPAROSCOPIC CHOLECYSTECTOMY WITH INTRAOPERATIVE CHOLANGIOGRAM;  Surgeon: Lodema Pilot, DO;  Location: [facility] OR;  Service: General;  Laterality: N/A;   DILATION AND CURETTAGE OF UTERUS     molar preg   LAPAROSCOPIC  BILATERAL SALPINGECTOMY Bilateral 05/09/2013   Procedure: LAPAROSCOPIC BILATERAL SALPINGECTOMY;  Surgeon: Allie Bossier, MD;  Location: [facility] ORS;  Service: Gynecology;  Laterality: Bilateral;   Family History:  Family History  Problem Relation Age of Onset   Diabetes Mother    Cancer Father    Diabetes Maternal Uncle    Cancer Maternal Grandmother    Cancer Maternal Grandfather    Hypertension Maternal Grandfather    Diabetes Maternal Grandfather    Sickle cell anemia Son    Asthma Son    Heart disease Paternal Grandfather    Hearing loss Paternal Aunt    Family Psychiatric  History: Psych: Mother with MDD, maternal aunt with bipolar d/o Psych Rx: Unsure SA/HA: Denies Substance use family hx: Denies     Social History:  Social History   Substance and Sexual Activity  Alcohol Use No     Social History   Substance and Sexual Activity  Drug Use Yes   Types: Marijuana   Comment: occasional    Social History   Socioeconomic History   Marital status: Single    Spouse name: Not on file   Number of children: Not on file   Years of education: Not on file   Highest education level: Not on file  Occupational History   Not on file  Tobacco Use   Smoking status: Never   Smokeless tobacco: Never  Vaping Use   Vaping Use: Never used  Substance and Sexual Activity   Alcohol use: No   Drug use: Yes    Types: Marijuana    Comment: occasional   Sexual activity: Yes    Birth control/protection: Surgical  Other Topics Concern   Not on file  Social History Narrative   2 Adults, 4 children at home   FOB involved   Social Determinants of Health   Financial Resource Strain: Not on file  Food Insecurity: No Food Insecurity (04/01/2022)   Hunger Vital Sign    Worried About Running Out of Food in the Last Year: Never true    Ran Out of Food in the Last Year: Never true  Transportation Needs: No Transportation Needs (04/01/2022)   PRAPARE - Scientist, research (physical sciences) (Medical): No    Lack of Transportation (Non-Medical): No  Physical Activity: Not on file  Stress: Not on file  Social Connections: Not on file   Additional Social History:    Sleep: Fair  Appetite:  Good  Current Medications: Current Facility-Administered Medications  Medication Dose Route Frequency Provider Last Rate Last Admin   acetaminophen (TYLENOL) tablet 650 mg  650 mg Oral Q6H PRN Motley-Mangrum, Jadeka A, PMHNP   650 mg at 04/04/22 1417   alum & mag hydroxide-simeth (MAALOX/MYLANTA) 200-200-20 MG/5ML suspension 30 mL  30 mL Oral Q4H PRN Motley-Mangrum, Jadeka A, PMHNP   30 mL at 04/04/22 1417   cephALEXin (KEFLEX) capsule 500 mg  500 mg Oral Q8H Motley-Mangrum, Jadeka A, PMHNP   500 mg at 04/04/22 1417   citalopram (CELEXA) tablet 10 mg  10 mg Oral Daily Motley-Mangrum, Jadeka A, PMHNP   10 mg at 04/04/22 0730   hydrOXYzine (ATARAX) tablet 25 mg  25 mg Oral TID PRN Motley-Mangrum, Ezra Sites, PMHNP   25  mg at 04/04/22 1249   loperamide (IMODIUM) capsule 2-4 mg  2-4 mg Oral PRN Massengill, Harrold Donath, MD       LORazepam (ATIVAN) tablet 1 mg  1 mg Oral Q6H PRN Massengill, Harrold Donath, MD       magnesium hydroxide (MILK OF MAGNESIA) suspension 30 mL  30 mL Oral Daily PRN Motley-Mangrum, Jadeka A, PMHNP       metoprolol tartrate (LOPRESSOR) tablet 12.5 mg  12.5 mg Oral BID Motley-Mangrum, Jadeka A, PMHNP   12.5 mg at 04/04/22 0730   multivitamin with minerals tablet 1 tablet  1 tablet Oral Daily Massengill, Nathan, MD   1 tablet at 04/04/22 0730   ondansetron (ZOFRAN-ODT) disintegrating tablet 4 mg  4 mg Oral Q6H PRN Massengill, Harrold Donath, MD       QUEtiapine (SEROQUEL) tablet 100 mg  100 mg Oral QHS Dannae Kato C, FNP   100 mg at 04/03/22 2119   QUEtiapine (SEROQUEL) tablet 50 mg  50 mg Oral QHS PRN Massengill, Harrold Donath, MD       thiamine (Vitamin B-1) tablet 100 mg  100 mg Oral Daily Massengill, Nathan, MD   100 mg at 04/04/22 0730    Lab Results:  No results found for this or  any previous visit (from the past 48 hour(s)).   Blood Alcohol level:  Lab Results  Component Value Date   ETH <10 03/31/2022   ETH <10 06/29/2019    Metabolic Disorder Labs: Lab Results  Component Value Date   HGBA1C 5.1 04/02/2022   MPG 100 04/02/2022   MPG 96.8 07/01/2019   Lab Results  Component Value Date   PROLACTIN 35.2 (H) 01/09/2016   Lab Results  Component Value Date   CHOL 145 04/02/2022   TRIG 126 04/02/2022   HDL 46 04/02/2022   CHOLHDL 3.2 04/02/2022   VLDL 25 04/02/2022   LDLCALC 74 04/02/2022   LDLCALC 88 07/01/2019    Physical Findings: AIMS:  , ,  ,  ,    CIWA:  CIWA-Ar Total: 0 COWS:     Musculoskeletal: Strength & Muscle Tone: within normal limits Gait & Station: normal Patient leans: N/A  Psychiatric Specialty Exam:  Presentation  General Appearance:  Appropriate for Environment; Casual; Fairly Groomed  Eye Contact: Good  Speech: Clear and Coherent; Normal Rate  Speech Volume: Normal  Handedness: Right   Mood and Affect  Mood: Anxious; Depressed  Affect: Appropriate; Congruent  Thought Process  Thought Processes: Coherent; Goal Directed  Descriptions of Associations:Intact  Orientation:Full (Time, Place and Person)  Thought Content:Logical  History of Schizophrenia/Schizoaffective disorder:No data recorded Duration of Psychotic Symptoms:No data recorded Hallucinations:Hallucinations: Visual Description of Auditory Hallucinations: Denies Description of Visual Hallucinations: Seeing fly by figures  Ideas of Reference:None  Suicidal Thoughts:Suicidal Thoughts: No SI Active Intent and/or Plan: -- (Denies) SI Passive Intent and/or Plan: -- (Denies)  Homicidal Thoughts:Homicidal Thoughts: No  Sensorium  Memory: Immediate Good  Judgment: Fair  Insight: Fair  Art therapist  Concentration: Good  Attention Span: Good  Recall: Good  Fund of Knowledge: Fair  Language: Good  Psychomotor  Activity  Psychomotor Activity: Psychomotor Activity: Normal  Assets  Assets: Communication Skills; Physical Health  Sleep  Sleep: Sleep: Good Number of Hours of Sleep: 5  Physical Exam: Physical Exam Vitals and nursing note reviewed.  Constitutional:      Appearance: Normal appearance.  HENT:     Head: Normocephalic and atraumatic.     Nose: Nose normal.     Mouth/Throat:  Mouth: Mucous membranes are moist.     Pharynx: Oropharynx is clear.  Eyes:     Extraocular Movements: Extraocular movements intact.     Conjunctiva/sclera: Conjunctivae normal.     Pupils: Pupils are equal, round, and reactive to light.  Cardiovascular:     Rate and Rhythm: Normal rate.     Pulses: Normal pulses.  Pulmonary:     Effort: Pulmonary effort is normal.  Abdominal:     Palpations: Abdomen is soft.  Genitourinary:    Comments: Deferred Musculoskeletal:        General: Normal range of motion.     Cervical back: Normal range of motion.  Skin:    General: Skin is warm.  Neurological:     General: No focal deficit present.     Mental Status: She is alert and oriented to person, place, and time.  Psychiatric:        Mood and Affect: Mood normal.        Behavior: Behavior normal.    Review of Systems  Constitutional: Negative.   HENT: Negative.    Eyes: Negative.   Respiratory: Negative.    Cardiovascular: Negative.   Gastrointestinal: Negative.   Genitourinary: Negative.   Musculoskeletal: Negative.   Skin: Negative.   Endo/Heme/Allergies: Negative.   Psychiatric/Behavioral:  Positive for depression and hallucinations. The patient is nervous/anxious and has insomnia.    Blood pressure 100/75, pulse 84, temperature 97.9 F (36.6 C), temperature source Oral, resp. rate 18, height 5\' 4"  (1.626 m), weight 63.5 kg, SpO2 100 %. Body mass index is 24.03 kg/m.  Treatment Plan Summary: Daily contact with patient to assess and evaluate symptoms and progress in treatment and  Medication management Observation Level/Precautions:  15 minute checks  Laboratory:  Labs reviewed   Psychotherapy:  Unit Group sessions  [facility]  [facility]  Consultations:  To be determined   Discharge Concerns:  Safety, medication compliance, mood stability  Estimated LOS: 5-7 days  Other:  N/A    Labs Reviewed: TSH 1.583, Lipid panel WNL, HA1C pending, CMP mostly WNL with exception of glucose of 123, CBC WNL, Urine pregnancy test negative, UA with nitrites and Leukocytes and bacteria. Pt being treating for a UTI.   PLAN Safety and Monitoring: Voluntary admission to inpatient psychiatric unit for safety, stabilization and treatment Daily contact with patient to assess and evaluate symptoms and progress in treatment Patient's case to be discussed in multi-disciplinary team meeting Observation Level : q15 minute checks Vital signs: q12 hours Precautions: Safety   Long Term Goal(s): Improvement in symptoms so as ready for discharge   Short Term Goals: Ability to identify changes in lifestyle to reduce recurrence of condition will improve, Ability to disclose and discuss suicidal ideas, Ability to demonstrate self-control will improve, Ability to identify and develop effective coping behaviors will improve, Compliance with prescribed medications will improve, and Ability to identify triggers associated with substance abuse/mental health issues will improve   Physician Treatment Plan for Secondary Diagnosis:  Principal Problem:   Severe episode of recurrent major depressive disorder, with psychotic features (HCC) Active Problems:   UTI (urinary tract infection)   Alcohol use   Cannabis use disorder   GAD (generalized anxiety disorder)   Medications (Pt educated on all medications, rationales, benefits and possible side effects, and agreeable to trials) -Continue Celexa 10 mg daily for depressive symptoms (started by admitting Provider) -Increase Seroquel from 50 mg nightly to 100  mg p.o. q. nightly for sleep/mood stabilization today 04/03/2022.   -  Continue Seroquel 50 mg PRN for insomnia -Continue Lopressor 12.5 mg daily for htn -Continue Keflex 500 mg Q 8 H for UTI -Continue Hydroxyzine 25 mg every 6 hours PRN for anxiety -Start Ativan 1mg  tabs every 6 hours PRN for CIWA > 10   Other PRNS -Continue Tylenol 650 mg every 6 hours PRN for mild pain -Continue Maalox 30 mg every 4 hrs PRN for indigestion -Continue Imodium 2-4 mg as needed for diarrhea -Continue Milk of Magnesia as needed every 6 hrs for constipation -Continue Zofran disintegrating tabs every 6 hrs PRN for nausea  Discharge Planning: Social work and case management to assist with discharge planning and identification of hospital follow-up needs prior to discharge Estimated LOS: 5-7 days Discharge Concerns: Need to establish a safety plan; Medication compliance and effectiveness Discharge Goals: Return home with outpatient referrals for mental health follow-up including medication management/psychotherapy  , FNP 04/04/2022, 2:28 PMPatient ID: 04/06/2022, female   DOB: 1985/10/11, 36 y.o.   MRN: [id number]

## 2022-04-04 NOTE — Group Note (Deleted)
LCSW Group Therapy Note   Group Date: 04/04/2022 Start Time: 1000 End Time: 1100   Type of Therapy and Topic:  Group Therapy:   Participation Level:  {[facility] PARTICIPATION LEVEL:22264}  Description of Group:   Therapeutic Goals:  1.     Summary of Patient Progress:    ***  Therapeutic Modalities:   Takahiro Godinho J Grossman-Orr, LCSWA 04/04/2022  9:18 AM    

## 2022-04-04 NOTE — Group Note (Signed)
[facility] LCSW Group Therapy Note  04/04/2022  10:00-11:00AM  Type of Therapy and Topic:  Group Therapy:  Adding Supports Including Myself  Participation Level:  Active   Description of Group:  Patients in this group were introduced to the differences between healthy supports and unheathy supports.  This led to a lengthy and emotional discussion about how to set boundaries, particularly with family members.  Many in group expressed that they put other people before themselves.  The group discussed that this not only hurts them, but ultimately ends up hurting the people they want to help.  Examples were given about how to set boundaries one at a time rather than merely cutting people off.  A song entitled "My Own Hero" was played.  A group discussion ensued in which patients stated they could relate to the song and it inspired them to realize they have be willing to help themselves in order to succeed, because other people cannot achieve sobriety or stability for them.  We discussed adding a variety of healthy supports to address the various needs in our lives.    Therapeutic Goals: 1)  demonstrate the importance of being a part of one's own support system by asking for and accepting help 2)  discuss reasons people in one's life may eventually be unable to be continually supportive  3)  identify the patient's current support system and   4)  elicit commitments to add healthy supports and to become more conscious of being self-supportive   Summary of Patient Progress:  The patient expressed her healthy support(s) right now include her family while unhealthy supports include drug dealers as well as family when they say things like "That's not what's wrong" as though she does not have the ability herself.  The patient's overall reaction to this topic was interested.  Therapeutic Modalities:   Motivational Interviewing Activity  Lynnell Chad

## 2022-04-04 NOTE — Progress Notes (Signed)
   04/04/22 0000  Psych Admission Type (Psych Patients Only)  Admission Status Voluntary  Psychosocial Assessment  Patient Complaints Anxiety;Depression  Eye Contact Fair  Facial Expression Wide-eyed  Affect Appropriate to circumstance;Anxious  Speech Logical/coherent  Interaction Assertive  Motor Activity Other (Comment)  Appearance/Hygiene Layered clothes  Behavior Characteristics Cooperative;Appropriate to situation  Mood Anxious;Depressed  Aggressive Behavior  Effect No apparent injury  Thought Process  Coherency WDL  Content WDL  Delusions WDL  Perception Depersonalization  Hallucination None reported or observed  Judgment Limited  Confusion None  Danger to Self  Current suicidal ideation? Passive  Agreement Not to Harm Self Yes  Description of Agreement verbal  Danger to Others  Danger to Others None reported or observed   Patient continue to endorse Passive SI stated she felt better today than yesterday she interacted well with Peers and staff. Verbally contracted for safety. Q 15 minutes safety checks ongoing . Patient remains safe.

## 2022-04-04 NOTE — [facility] Group Notes (Signed)
Adult Psychoeducational Group  Date:  04/04/2022 Time:  1100-1200  Group Topic/Focus: Continuation of the group from Saturday. Looking at the lists that were created and talking about what needs to be done with the homework of 30 positives about themselves.                                     Talking about taking their power back and helping themselves to develop a positive self esteem.      Participation Quality:  Appropriate  Affect:  Appropriate  Cognitive:  Oriented  Insight: Improving  Engagement in Group:  Engaged  Modes of Intervention:  Activity, Discussion, Education, and Support  Additional Comments:  Rates energy at a 6/10. Participated in the group.  Dione Housekeeper

## 2022-04-04 NOTE — [facility] Group Notes (Signed)
Adult Psychoeducational Group Note  Date:  04/04/2022 Time:  9:25 PM  Group Topic/Focus:  Wrap-Up Group:   The focus of this group is to help patients review their daily goal of treatment and discuss progress on daily workbooks.  Participation Level:  Active  Participation Quality:  Appropriate and Attentive  Affect:  Appropriate  Cognitive:  Alert and Appropriate  Insight: Appropriate and Good  Engagement in Group:  Engaged  Modes of Intervention:  Discussion and Education  Additional Comments:  Pt attended and participated in wrap up group this evening and rated their day a 5/10. Pt uses laughing and talking with peers as a helpful coping skill. Pt is still working on their goal, which is to "stay out my head" and to improve their mood.   Grace [name] 04/04/2022, 9:25 PM

## 2022-04-04 NOTE — Progress Notes (Signed)
   04/04/22 0900  Psych Admission Type (Psych Patients Only)  Admission Status Voluntary  Psychosocial Assessment  Patient Complaints Anxiety  Eye Contact Fair  Facial Expression Wide-eyed  Affect Anxious;Appropriate to circumstance  Speech Logical/coherent  Interaction Assertive  Motor Activity Other (Comment) (WNL)  Appearance/Hygiene Layered clothes  Behavior Characteristics Cooperative;Appropriate to situation  Mood Anxious;Pleasant  Aggressive Behavior  Effect No apparent injury  Thought Process  Coherency Circumstantial  Content WDL  Delusions WDL  Perception Depersonalization  Hallucination None reported or observed  Judgment Limited  Confusion None  Danger to Self  Current suicidal ideation? Passive  Agreement Not to Harm Self Yes  Description of Agreement Verbal  Danger to Others  Danger to Others None reported or observed

## 2022-04-05 DIAGNOSIS — F333 Major depressive disorder, recurrent, severe with psychotic symptoms: Secondary | ICD-10-CM | POA: Diagnosis not present

## 2022-04-05 MED ORDER — QUETIAPINE FUMARATE ER 50 MG PO TB24
50.0000 mg | ORAL_TABLET | Freq: Every day | ORAL | Status: DC
  Administered 2022-04-05 – 2022-04-06 (×2): 50 mg via ORAL
  Filled 2022-04-05 (×6): qty 1

## 2022-04-05 MED ORDER — QUETIAPINE FUMARATE ER 50 MG PO TB24: 100.0000 mg | ORAL_TABLET | Freq: Every day | ORAL | Status: DC

## 2022-04-05 MED ORDER — QUETIAPINE FUMARATE ER 200 MG PO TB24
200.0000 mg | ORAL_TABLET | Freq: Every day | ORAL | Status: DC
  Administered 2022-04-05 – 2022-04-07 (×3): 200 mg via ORAL
  Filled 2022-04-05 (×6): qty 1

## 2022-04-05 MED ORDER — CITALOPRAM HYDROBROMIDE 20 MG PO TABS
20.0000 mg | ORAL_TABLET | Freq: Every day | ORAL | Status: DC
  Administered 2022-04-06 – 2022-04-08 (×3): 20 mg via ORAL
  Filled 2022-04-05 (×5): qty 1

## 2022-04-05 NOTE — Plan of Care (Signed)
  Problem: Education: Goal: Ability to make informed decisions regarding treatment will improve Outcome: Progressing   Problem: Medication: Goal: Compliance with prescribed medication regimen will improve Outcome: Progressing   Problem: Self-Concept: Goal: Ability to disclose and discuss suicidal ideas will improve Outcome: Progressing

## 2022-04-05 NOTE — Progress Notes (Signed)
Adult Psychoeducational Group Note  Date:  04/05/2022 Time:  9:48 PM  Group Topic/Focus:  AA Group  Participation Level:  Active  Participation Quality:  Appropriate  Affect:  Appropriate  Cognitive:  Appropriate  Insight: Appropriate  Engagement in Group:  Engaged  Modes of Intervention:  Discussion, Education, and Support  Additional Comments:   Pt attended and participated in the AA/Wrap Up group.  Grace [name] 04/05/2022, 9:48 PM

## 2022-04-05 NOTE — Progress Notes (Signed)
[facility] MD Progress Note  04/05/2022 2:22 PM Grace [name]  MRN:  [id number] Principal Problem: Severe episode of recurrent major depressive disorder, with psychotic features ([place]) Diagnosis: Principal Problem:   Severe episode of recurrent major depressive disorder, with psychotic features ([place]) Active Problems:   UTI (urinary tract infection)   Alcohol use   Cannabis use disorder   GAD (generalized anxiety disorder)   Reason For Admission:  Grace [name] is a 36 yo AAF with a history of MDD & THC abuse who presented to the [facility] after taking 5 tabs of Advil with an intent to end her life in the context of multiple stressors. Pt was medically stabilized and transferred to this [facility] voluntarily for treatment and stabilization of her mood.   24 hr chart review: V/S for the past 24 hrs WNL. Pt has been compliant with her scheduled medications, and required Hydroxyzine 25 mg earlier today morning for anxiety. Pt attended some unit group sessions yesterday, but has not attended any today.   Today's patient assessment note: Pt with flat affect and depressed mood, attention to personal hygiene and grooming is fair, eye contact is good, speech is clear & coherent. Thought contents are organized and logical, and pt currently presents with passive SI, denies having a plan, and verbally contracts for safety on the unit. She reports that she had active suicidal thoughts earlier today morning. She denies AVH, denies HI denies paranoia, and there is no evidence of delusional thinking.  Pt reports a high level of anxiety, rates it 8 (10 being worst), rates her depression as 8 (10 being worst), and reports that Hydroxyzine alone during the day is not sufficient in controlling anxiety. She reports that Seroquel during the day in the past had been helpful.  She also reports trouble going to sleep at night due to anxiety, and states that once asleep, she is woken up by racing thoughts. We are changing  Seroquel to extended release, and increasing night time dose to 200 mg and starting day time dose of 50 mg for management of anxiety during the day. We are keeping PRN dose of 50 mg IR nightly for insomnia. Continuing all other medications as listed below. Pt denies having any medication related side effects.  Total Time spent with patient: 30 minutes  Past Psychiatric History: Previous Psych Diagnoses: MDD, GAD Prior inpatient treatment: Here at [facility] on 03/16/2015, 01/05/2016, 06/30/2019 Current/prior outpatient treatment: Non at this time Prior rehab hx: None Psychotherapy [id number] History of suicide attempts: 06/2019 leading to hospitalization here at that time History of homicide or aggression: Denies  Home [id number] 0.5 mg BID, Tapazole 10 mg BID Prior Hosp: Just for childbirth per pt Prior Surgeries/Trauma: Denies  Head trauma, LOC, concussions, seizures: Denies Allergies: Denies  LMP: 03/19/22 Contraception: Denies use  PCP: Denies having one  Past Medical History:  Medical Diagnoses: Hyperthyroidism Past Medical History:  Diagnosis Date   Cholecystitis 08/18/2011   Headache(784.0)    otc med prn   History of recurrent UTIs    recurrent UTIs   Hypertension    Hyperthyroidism    Joint pain    knees and shoulders - otc med prn   Sickle cell trait ([place])    Sickle cell trait ([place])    SVD (spontaneous vaginal delivery)    x 3 [place]    Past Surgical History:  Procedure Laterality Date   CHOLECYSTECTOMY  08/19/2011   Procedure: LAPAROSCOPIC CHOLECYSTECTOMY WITH INTRAOPERATIVE CHOLANGIOGRAM;  Surgeon: Madilyn Hook, DO;  Location: [facility] OR;  Service: General;  Laterality: N/A;   DILATION AND CURETTAGE OF UTERUS     molar preg   LAPAROSCOPIC BILATERAL SALPINGECTOMY Bilateral 05/09/2013   Procedure: LAPAROSCOPIC BILATERAL SALPINGECTOMY;  Surgeon: Emily Filbert, MD;  Location: [facility];  Service: Gynecology;  Laterality: Bilateral;   Family History:  Family History  Problem Relation  Age of Onset   Diabetes Mother    Cancer Father    Diabetes Maternal Uncle    Cancer Maternal Grandmother    Cancer Maternal Grandfather    Hypertension Maternal Grandfather    Diabetes Maternal Grandfather    Sickle cell anemia Son    Asthma Son    Heart disease Paternal Grandfather    Hearing loss Paternal Aunt    Family Psychiatric  History: Psych: Mother with MDD, maternal aunt with bipolar d/o Psych Rx: Unsure SA/HA: Denies Substance use family hx: Denies     Social History:  Social History   Substance and Sexual Activity  Alcohol Use No     Social History   Substance and Sexual Activity  Drug Use Yes   Types: Marijuana   Comment: occasional    Social History   Socioeconomic History   Marital status: Single    Spouse name: Not on file   Number of children: Not on file   Years of education: Not on file   Highest education level: Not on file  Occupational History   Not on file  Tobacco Use   Smoking status: Never   Smokeless tobacco: Never  Vaping Use   Vaping Use: Never used  Substance and Sexual Activity   Alcohol use: No   Drug use: Yes    Types: Marijuana    Comment: occasional   Sexual activity: Yes    Birth control/protection: Surgical  Other Topics Concern   Not on file  Social History Narrative   2 Adults, 4 children at home   FOB involved   Social Determinants of Health   Financial Resource Strain: Not on file  Food Insecurity: No Food Insecurity (04/01/2022)   Hunger Vital Sign    Worried About Running Out of Food in the Last Year: Never true    Ran Out of Food in the Last Year: Never true  Transportation Needs: No Transportation Needs (04/01/2022)   PRAPARE - Hydrologist (Medical): No    Lack of Transportation (Non-Medical): No  Physical Activity: Not on file  Stress: Not on file  Social Connections: Not on file   Additional Social History:    Sleep: Fair  Appetite:  Good  Current  Medications: Current Facility-Administered Medications  Medication Dose Route Frequency Provider Last Rate Last Admin   acetaminophen (TYLENOL) tablet 650 mg  650 mg Oral Q6H PRN Motley-Mangrum, Jadeka A, PMHNP   650 mg at 04/04/22 1417   alum & mag hydroxide-simeth (MAALOX/MYLANTA) 200-200-20 MG/5ML suspension 30 mL  30 mL Oral Q4H PRN Motley-Mangrum, Jadeka A, PMHNP   30 mL at 04/05/22 1417   cephALEXin (KEFLEX) capsule 500 mg  500 mg Oral Q8H Motley-Mangrum, Jadeka A, PMHNP   500 mg at 04/05/22 0626   [START ON 04/06/2022] citalopram (CELEXA) tablet 20 mg  20 mg Oral Daily Massengill, Nathan, MD       hydrOXYzine (ATARAX) tablet 25 mg  25 mg Oral TID PRN Motley-Mangrum, Jadeka A, PMHNP   25 mg at 04/05/22 0810   magnesium hydroxide (MILK OF MAGNESIA) suspension 30 mL  30  mL Oral Daily PRN Motley-Mangrum, Jadeka A, PMHNP       metoprolol tartrate (LOPRESSOR) tablet 12.5 mg  12.5 mg Oral BID Motley-Mangrum, Jadeka A, PMHNP   12.5 mg at 04/05/22 0809   multivitamin with minerals tablet 1 tablet  1 tablet Oral Daily Massengill, Nathan, MD   1 tablet at 04/05/22 0809   QUEtiapine (SEROQUEL XR) 24 hr tablet 200 mg  200 mg Oral QHS Massengill, Nathan, MD       QUEtiapine (SEROQUEL XR) 24 hr tablet 50 mg  50 mg Oral Daily Massengill, Nathan, MD   50 mg at 04/05/22 1148   QUEtiapine (SEROQUEL) tablet 50 mg  50 mg Oral QHS PRN Massengill, Ovid Curd, MD       thiamine (Vitamin B-1) tablet 100 mg  100 mg Oral Daily Massengill, Nathan, MD   100 mg at 04/05/22 [id number]    Lab Results:  No results found for this or any previous visit (from the past 37 hour(s)).   Blood Alcohol level:  Lab Results  Component Value Date   ETH <10 03/31/2022   ETH <10 [id number]    Metabolic Disorder Labs: Lab Results  Component Value Date   HGBA1C 5.1 04/02/2022   MPG 100 04/02/2022   MPG 96.8 07/01/2019   Lab Results  Component Value Date   PROLACTIN 35.2 (H) 01/09/2016   Lab Results  Component Value Date   CHOL  145 04/02/2022   TRIG 126 04/02/2022   HDL 46 04/02/2022   CHOLHDL 3.2 04/02/2022   VLDL 25 04/02/2022   LDLCALC 74 04/02/2022   LDLCALC 88 07/01/2019   Physical Findings: AIMS: 0    CIWA:  CIWA-Ar Total: 1 COWS: n/a    Musculoskeletal: Strength & Muscle Tone: within normal limits Gait & Station: normal Patient leans: N/A  Psychiatric Specialty Exam:  Presentation  General Appearance:  Appropriate for Environment; Fairly Groomed  Eye Contact: Fair  Speech: Clear and Coherent  Speech Volume: Normal  Handedness: Right   Mood and Affect  Mood: Depressed; Anxious  Affect: Congruent  Thought Process  Thought Processes: Coherent  Descriptions of Associations:Intact  Orientation:Full (Time, Place and Person)  Thought Content:Logical  History of Schizophrenia/Schizoaffective disorder:No data recorded Duration of Psychotic Symptoms:No data recorded Hallucinations:Hallucinations: None Description of Auditory Hallucinations: Denies Description of Visual Hallucinations: Seeing fly by figures  Ideas of Reference:None  Suicidal Thoughts:Suicidal Thoughts: Yes, Passive SI Active Intent and/or Plan: Without Intent; Without Plan SI Passive Intent and/or Plan: -- (Denies)  Homicidal Thoughts:Homicidal Thoughts: No  Sensorium  Memory: Immediate Good  Judgment: Fair  Insight: Fair  Materials engineer: Fair  Attention Span: Fair  Recall: [place] of Knowledge: Fair  Language: Fair  Psychomotor Activity  Psychomotor Activity: Psychomotor Activity: Normal  Assets  Assets: Armed forces logistics/support/administrative officer; Resilience  Sleep  Sleep: Sleep: Fair Number of Hours of Sleep: 5  Physical Exam: Physical Exam Vitals and nursing note reviewed.  Constitutional:      Appearance: Normal appearance.  HENT:     Head: Normocephalic and atraumatic.     Nose: Nose normal.     Mouth/Throat:     Mouth: Mucous membranes are moist.      Pharynx: Oropharynx is clear.  Eyes:     Extraocular Movements: Extraocular movements intact.     Conjunctiva/sclera: Conjunctivae normal.     Pupils: Pupils are equal, round, and reactive to light.  Cardiovascular:     Rate and Rhythm: Normal rate.     Pulses:  Normal pulses.  Pulmonary:     Effort: Pulmonary effort is normal.  Abdominal:     Palpations: Abdomen is soft.  Genitourinary:    Comments: Deferred Musculoskeletal:        General: Normal range of motion.     Cervical back: Normal range of motion.  Skin:    General: Skin is warm.  Neurological:     General: No focal deficit present.     Mental Status: She is alert and oriented to person, place, and time.  Psychiatric:        Mood and Affect: Mood normal.        Behavior: Behavior normal.    Review of Systems  Constitutional: Negative.   HENT: Negative.    Eyes: Negative.   Respiratory: Negative.    Cardiovascular: Negative.   Gastrointestinal: Negative.   Genitourinary: Negative.   Musculoskeletal: Negative.   Skin: Negative.   Endo/Heme/Allergies: Negative.   Psychiatric/Behavioral:  Positive for depression, substance abuse and suicidal ideas. Negative for hallucinations and memory loss. The patient is nervous/anxious and has insomnia.    Blood pressure 114/67, pulse 87, temperature 98.1 F (36.7 C), temperature source Oral, resp. rate 18, height 5\' 4"  (1.626 m), weight 63.5 kg, SpO2 100 %. Body mass index is 24.03 kg/m.  Treatment Plan Summary: Daily contact with patient to assess and evaluate symptoms and progress in treatment and Medication management Observation Level/Precautions:  15 minute checks  Laboratory:  Labs reviewed   Psychotherapy:  Unit Group sessions  Medications:  See [facility]  Consultations:  To be determined   Discharge Concerns:  Safety, medication compliance, mood stability  Estimated LOS: 5-7 days  Other:  N/A    Labs Reviewed on 04/05/2022: TSH WNL at 1.583, Lipid panel WNL, HA1C  WNL, CMP mostly WNL with exception of glucose of 123, CBC WNL, Urine pregnancy test negative, UA with nitrites and Leukocytes and bacteria. Pt being treating for a UTI.   PLAN Safety and Monitoring: Voluntary admission to inpatient psychiatric unit for safety, stabilization and treatment Daily contact with patient to assess and evaluate symptoms and progress in treatment Patient's case to be discussed in multi-disciplinary team meeting Observation Level : q15 minute checks Vital signs: q12 hours Precautions: Safety   Long Term Goal(s): Improvement in symptoms so as ready for discharge   Short Term Goals: Ability to identify changes in lifestyle to reduce recurrence of condition will improve, Ability to disclose and discuss suicidal ideas, Ability to demonstrate self-control will improve, Ability to identify and develop effective coping behaviors will improve, Compliance with prescribed medications will improve, and Ability to identify triggers associated with substance abuse/mental health issues will improve   Physician Treatment Plan for Secondary Diagnosis:  Principal Problem:   Severe episode of recurrent major depressive disorder, with psychotic features ([facility]) Active Problems:   UTI (urinary tract infection)   Alcohol use   Cannabis use disorder   GAD (generalized anxiety disorder)   Medications (Pt educated on all medications, rationales, benefits and possible side effects, and agreeable to trials) -Continue Celexa 10 mg daily for depressive symptoms (started by admitting Provider) -Change Seroquel to XR and start  50 mg during the day, and increase night dose to 200 mg p.o. q. nightly for sleep/mood stabilization -Continue Seroquel 50 mg PRN for insomnia -Continue Lopressor 12.5 mg daily for htn -Keflex 500 mg Q 8 H for UTI-ends 12/18 @ 1400 -Continue Hydroxyzine 25 mg every 6 hours PRN for anxiety -Continue Ativan 1mg  tabs every  6 hours PRN for CIWA > 10   Other  PRNS -Continue Tylenol 650 mg every 6 hours PRN for mild pain -Continue Maalox 30 mg every 4 hrs PRN for indigestion -Continue Imodium 2-4 mg as needed for diarrhea -Continue Milk of Magnesia as needed every 6 hrs for constipation -Continue Zofran disintegrating tabs every 6 hrs PRN for nausea   Discharge Planning: Social work and case management to assist with discharge planning and identification of hospital follow-up needs prior to discharge Estimated LOS: 5-7 days Discharge Concerns: Need to establish a safety plan; Medication compliance and effectiveness Discharge Goals: Return home with outpatient referrals for mental health follow-up including medication management/psychotherapy  Nicholes Rough, NP 04/05/2022, 2:22 PMPatient ID: Grace [name], female   DOB: Oct 01, 1985, 36 y.o.   MRN: [id number] Patient ID: Grace [name], female   DOB: 11/27/1985, 36 y.o.   MRN: [id number]

## 2022-04-05 NOTE — BHH Group Notes (Signed)
Spiritual care group on grief and loss facilitated by Chaplain Dyanne Carrel, BCC, and Chaplain Genesis Adams   Group Goal:  Support / Education around grief and loss  Members engage in facilitated group support and psycho-social education.  Group Description:  Following introductions and group rules, group members engaged in facilitated group dialog and support around topic of loss, with particular support around experiences of loss in their lives. Group Identified types of loss (relationships / self / things) and identified patterns, circumstances, and changes that precipitate losses. Reflected on thoughts / feelings around loss, normalized grief responses, and recognized variety in grief experience.  Group drew on Adlerian / Rogerian, narrative, MI,  Patient Progress: Grace [name] attended group and participated in group activities.  Her verbal participated was somewhat limited, but she showed engagement and her comments demonstrated insight.    [address], Bcc Pager, [phone]

## 2022-04-05 NOTE — Group Note (Signed)
Recreation Therapy Group Note   Group Topic:Other  Group Date: 04/05/2022 Start Time: 0935 End Time: 1010 Facilitators: Math Brazie-McCall, LRT,CTRS Location: [address] Day[address]   Goal Area(s) Addresses:  Patient will participate in the creative process to complete all crafts.  Patient will interact pro-socially with staff and peers. Patient will share traditions, activities, and positive feelings produced during the holidays.   Group Description: LRT facilitated a therapeutic art activity to encourage self-expression and creativity in recognition of the approaching holidays. Writer explained that the folded paper icicles and chalk drawings created in session will be used to decorate the unit to boost mood and create a positive atmosphere. Patients were encouraged to engage in leisure discussions about festive activities and hobbies they like to participate in during the winter season.   Affect/Mood: N/A   Participation Level: Did not attend    Clinical Observations/Individualized Feedback:      Plan: Continue to engage patient in RT group sessions 2-3x/week.   Branndon Tuite-McCall, LRT,CTRS  04/05/2022 11:57 AM

## 2022-04-05 NOTE — BHH Group Notes (Signed)
Adult Psychoeducational Group Note   Date:  04/05/2022 Time:  9:23 AM   Group Topic/Focus:    Emotional Education:   The focus of this group is to discuss what feelings/emotions are, and how they are experienced.   Goals Group:   The focus of this group is to help patients establish daily goals to achieve during treatment and discuss how the patient can incorporate goal setting into their daily lives to aide in recovery.   Healthy Communication:   The focus of this group is to discuss communication, barriers to communication, as well as healthy ways to communicate with others.   Participation Level:  Did not participate   Participation Quality: Did not participate   Affect:  Did not participate   Cognitive:  Did not participate   Insight: Did not participate   Engagement in Group:  Did not participate   Modes of Intervention:  Discussion, Education, and Exploration   Additional Comments: Patient did not attend group. Invited but did not attend   Ahrianna Siglin R  RN           

## 2022-04-05 NOTE — Progress Notes (Addendum)
Patient cooperative on unit. Endorses passive SI but denies A/V/H and HI with no plan/intent. Patient is med compliant and states her day has been ok except after she made a phone call she stated it did not go so well but did not go into detail and stated it was "just family issues." Patient observed mostly in day area socializing appropriately. Patient reported indigestion and received maalox po prn which was effective. No other concerns verbalize by patient at this time.     04/05/22 0841  Psych Admission Type (Psych Patients Only)  Admission Status Voluntary  Psychosocial Assessment  Patient Complaints Anxiety  Eye Contact Fair  Facial Expression Flat  Affect Anxious  Speech Logical/coherent  Interaction Assertive  Motor Activity Other (Comment) (WDL)  Appearance/Hygiene Unremarkable  Behavior Characteristics Cooperative;Anxious  Mood Anxious  Thought Process  Coherency Circumstantial  Content WDL  Delusions None reported or observed  Perception WDL  Hallucination None reported or observed  Judgment Limited  Confusion None  Danger to Self  Current suicidal ideation? Passive  Agreement Not to Harm Self Yes  Description of Agreement verbally contracts for safety  Danger to Others  Danger to Others None reported or observed

## 2022-04-06 DIAGNOSIS — F333 Major depressive disorder, recurrent, severe with psychotic symptoms: Secondary | ICD-10-CM | POA: Diagnosis not present

## 2022-04-06 MED ORDER — QUETIAPINE FUMARATE ER 50 MG PO TB24
50.0000 mg | ORAL_TABLET | Freq: Two times a day (BID) | ORAL | Status: DC
  Administered 2022-04-06 – 2022-04-13 (×14): 50 mg via ORAL
  Filled 2022-04-06: qty 1
  Filled 2022-04-06 (×2): qty 20
  Filled 2022-04-06: qty 1
  Filled 2022-04-06: qty 20
  Filled 2022-04-06 (×2): qty 1
  Filled 2022-04-06 (×6): qty 20
  Filled 2022-04-06 (×5): qty 1
  Filled 2022-04-06: qty 20

## 2022-04-06 NOTE — Progress Notes (Signed)
Pt rates depression 7/10 and anxiety 8/10. Pt reports a fair appetite, and no physical problems. PRN vistaril requested and administered to assist with anxiety and sleep Pt denies SI/HI/AVH and verbally contracts for safety. Provided support and encouragement. Pt safe on the unit. Q 15 minute safety checks continued.

## 2022-04-06 NOTE — Progress Notes (Signed)
D: Pt endorsed passive SI this morning without a plan. Pt reports "The thoughts come and go but I do not have a plan for how I would hurt or kill myself right now". Pt verbally contracts for safety. Pt reported visual hallucinations in the form of shadow figures that she sees whenever she is feeling very anxious or depressed. Pt denied HI and AH. Pt rated her depression a 8/10 and her anxiety a 8/10. Pt has been calm and cooperative throughout the shift.   A: RN provided support and encouragement to patient. PRN Hydroxyzine. Pt given scheduled medications as prescribed. Q15 min checks verified for safety.    R: Patient verbally contracts for safety. Patient compliant with medications and treatment plan. Pt is safe on the unit.   04/06/22 0956  Psych Admission Type (Psych Patients Only)  Admission Status Voluntary  Psychosocial Assessment  Patient Complaints Anxiety;Depression  Eye Contact Fair  Facial Expression Flat  Affect Anxious;Depressed  Speech Logical/coherent  Interaction Assertive  Motor Activity Other (Comment) (WDL)  Appearance/Hygiene Unremarkable  Behavior Characteristics Cooperative;Anxious  Mood Depressed;Anxious  Thought Process  Coherency Circumstantial  Content WDL  Delusions None reported or observed  Perception WDL  Hallucination None reported or observed  Judgment Impaired  Confusion None  Danger to Self  Current suicidal ideation? Passive  Description of Suicide Plan No plan  Self-Injurious Behavior Some self-injurious ideation observed or expressed.  No lethal plan expressed   Agreement Not to Harm Self Yes  Description of Agreement Pt verbally contracts for safety  Danger to Others  Danger to Others None reported or observed

## 2022-04-06 NOTE — Group Note (Signed)
LCSW Group Therapy Note   Group Date: 04/06/2022 Start Time: 1100 End Time: 1200  Type of Therapy and Topic:  Group Therapy:  Healthy and Unhealthy Supports   Participation Level:  Active   Description of Group:  Patients in this group were introduced to the idea of adding a variety of healthy supports to address the various needs in their lives. Patients discussed what additional healthy supports could be helpful in their recovery and wellness after discharge in order to prevent future hospitalizations.   An emphasis was placed on using counselor, doctor, therapy groups, 12-step groups, and problem-specific support groups to expand supports.  They also worked as a group on developing a specific plan for several patients to deal with unhealthy supports through boundary-setting, psychoeducation with loved ones, and even termination of relationships.   Therapeutic Goals:               1)  discuss importance of adding supports to stay well once out of the hospital             2)  compare healthy versus unhealthy supports and identify some examples of each             3)  generate ideas and descriptions of healthy supports that can be added             4)  offer mutual support about how to address unhealthy supports             5)  encourage active participation in and adherence to discharge plan               Summary of Patient Progress:  The Pt attended group and remained there the entire time.  The Pt accepted all worksheets and materials that were provided.  The Pt was able to identify both healthy and unhealthy supports in their life.  The Pt was appropriate with their peers and demonstrated understanding of the topic being discussed.     Therapeutic Modalities:   Motivational Interviewing Brief Solution-Focused Therapy  Aleyssa Pike M Khia Dieterich, LCSWA 04/06/2022  1:09 PM    

## 2022-04-06 NOTE — Group Note (Signed)
Recreation Therapy Group Note   Group Topic:Animal Assisted Therapy   Group Date: 04/06/2022 Start Time: 1430 End Time: 1515 Facilitators: Dorin Stooksbury-McCall, LRT,CTRS Location: [address]   Animal-Assisted Activity (AAA) Program Checklist/Progress Notes Patient Eligibility Criteria Checklist & Daily Group note for Rec Tx Intervention  AAA/T Program Assumption of Risk Form signed by Patient/ or Parent Legal Guardian Yes  Patient is free of allergies or severe asthma Yes  Patient reports no fear of animals Yes  Patient reports no history of cruelty to animals Yes  Patient understands his/her participation is voluntary Yes  Patient washes hands before animal contact Yes  Patient washes hands after animal contact Yes   Affect/Mood: Appropriate   Participation Level: Engaged   Participation Quality: Independent   Behavior: Appropriate    Clinical Observations/Individualized Feedback:  Pt attended and participated in group session.     Plan: Continue to engage patient in RT group sessions 2-3x/week.   Grace [name], LRT,CTRS 04/06/2022 4:05 PM

## 2022-04-06 NOTE — Progress Notes (Signed)
Adult Psychoeducational Group Note  Date:  04/06/2022 Time:  3:54 PM  Group Topic/Focus:  Orientation:   The focus of this group is to educate the patient on the purpose and policies of crisis stabilization and provide a format to answer questions about their admission.  The group details unit policies and expectations of patients while admitted.  Participation Level:  Active  Participation Quality:  Appropriate  Affect:  Appropriate  Cognitive:  Appropriate  Insight: Appropriate  Engagement in Group:  Engaged  Modes of Intervention:  Discussion  Additional Comments:  Pt attended the orientation group and remained appropriate and engaged throughout the duration of the group.   Sheran Lawless 04/06/2022, 3:54 PM

## 2022-04-06 NOTE — Progress Notes (Signed)
Adult Psychoeducational Group Note  Date:  04/06/2022 Time:  9:22 PM  Group Topic/Focus:  Wrap-Up Group:   The focus of this group is to help patients review their daily goal of treatment and discuss progress on daily workbooks.  Participation Level:  Active  Participation Quality:  Appropriate  Affect:  Appropriate  Cognitive:  Appropriate  Insight: Appropriate  Engagement in Group:  Engaged  Modes of Intervention:  Discussion  Additional Comments:  Marlene Bast said her day was a 5. Her goal was to make phone calls no she did not achieve her goal. Group has been the most helpful coping skills  Chauncey Fischer 04/06/2022, 9:22 PM

## 2022-04-06 NOTE — Progress Notes (Signed)
[facility] MD Progress Note  04/06/2022 4:06 PM Dore Grace [name]  MRN:  [id number] Principal Problem: Severe episode of recurrent major depressive disorder, with psychotic features (HCC) Diagnosis: Principal Problem:   Severe episode of recurrent major depressive disorder, with psychotic features (HCC) Active Problems:   UTI (urinary tract infection)   Alcohol use   Cannabis use disorder   GAD (generalized anxiety disorder)   Reason For Admission:  Grace [name] is a 36 yo AAF with a history of MDD & THC abuse who presented to the [facility] after taking 5 tabs of Advil with an intent to end her life in the context of multiple stressors. Pt was medically stabilized and transferred to this [facility] voluntarily for treatment and stabilization of her mood.   24 hr chart review: V/S for the past 24 hrs WNL. Pt has been compliant with her scheduled medications, and required Hydroxyzine 25 mg earlier today morning, and again in the afternoon for anxiety. Pt attended some unit group sessions yesterday, and has been seen in the day room today, but with minimal interactions with her peers.  Today's patient assessment note: Pt continues to present with passive SI today, states that she does not care if she lives or dies, states she would be okay going to sleep and not waking up.Mood remains depressed, affect is flat, she reports +VH of "figures" in her room yesterday afternoon, and reports that she saw "fly by things", which were there for a few seconds, then disappeared. She presents with paranoia and states that she saw a police officer on the unit yesterday, and immediately thought that he was here to "pick me up" even though she states that she is not in trouble with the law. She reports that her suicide thoughts have been "coming and going". She denies AH/HI. She denies delusional thoughts.  Pt reported her anxiety at time of encounter as 9 (10 being worst). She reported that the change in the Seroquel  last night to XR rendered her unable to fall to sleep fast, and was ekducated that she also has the IR Seroquel, on a as needed basis nightly, and should ask her RN for it if she needs it. Pt verbalized understanding. We will add Seroquel XR 50 mg to her afternoon medication regimen in addition to the 50 mg that she is getting in the mornings, and the 200 mg nightly. We are continuing other medications as listed below. No TD/EPS type symptoms found on assessment, and pt denies any feelings of stiffness. AIMS: 0.   Total Time spent with patient: 30 minutes  Past Psychiatric History: Previous Psych Diagnoses: MDD, GAD Prior inpatient treatment: Here at [facility] on 03/16/2015, 01/05/2016, 06/30/2019 Current/prior outpatient treatment: Non at this time Prior rehab hx: None Psychotherapy MH:DQQI History of suicide attempts: 06/2019 leading to hospitalization here at that time History of homicide or aggression: Denies  Home WL:[id number] 0.5 mg BID, Tapazole 10 mg BID Prior Hosp: Just for childbirth per pt Prior Surgeries/Trauma: Denies  Head trauma, LOC, concussions, seizures: Denies Allergies: Denies  LMP: 03/19/22 Contraception: Denies use  PCP: Denies having one  Past Medical History:  Medical Diagnoses: Hyperthyroidism Past Medical History:  Diagnosis Date   Cholecystitis 08/18/2011   Headache(784.0)    otc med prn   History of recurrent UTIs    recurrent UTIs   Hypertension    Hyperthyroidism    Joint pain    knees and shoulders - otc med prn   Sickle cell trait (HCC)  Sickle cell trait (HCC)    SVD (spontaneous vaginal delivery)    x 3 [facility]    Past Surgical History:  Procedure Laterality Date   CHOLECYSTECTOMY  08/19/2011   Procedure: LAPAROSCOPIC CHOLECYSTECTOMY WITH INTRAOPERATIVE CHOLANGIOGRAM;  Surgeon: Lodema Pilot, DO;  Location: [facility] OR;  Service: General;  Laterality: N/A;   DILATION AND CURETTAGE OF UTERUS     molar preg   LAPAROSCOPIC BILATERAL SALPINGECTOMY Bilateral  05/09/2013   Procedure: LAPAROSCOPIC BILATERAL SALPINGECTOMY;  Surgeon: Allie Bossier, MD;  Location: [facility] ORS;  Service: Gynecology;  Laterality: Bilateral;   Family History:  Family History  Problem Relation Age of Onset   Diabetes Mother    Cancer Father    Diabetes Maternal Uncle    Cancer Maternal Grandmother    Cancer Maternal Grandfather    Hypertension Maternal Grandfather    Diabetes Maternal Grandfather    Sickle cell anemia Son    Asthma Son    Heart disease Paternal Grandfather    Hearing loss Paternal Aunt    Family Psychiatric  History: Psych: Mother with MDD, maternal aunt with bipolar d/o Psych Rx: Unsure SA/HA: Denies Substance use family hx: Denies     Social History:  Social History   Substance and Sexual Activity  Alcohol Use No     Social History   Substance and Sexual Activity  Drug Use Yes   Types: Marijuana   Comment: occasional    Social History   Socioeconomic History   Marital status: Single    Spouse name: Not on file   Number of children: Not on file   Years of education: Not on file   Highest education level: Not on file  Occupational History   Not on file  Tobacco Use   Smoking status: Never   Smokeless tobacco: Never  Vaping Use   Vaping Use: Never used  Substance and Sexual Activity   Alcohol use: No   Drug use: Yes    Types: Marijuana    Comment: occasional   Sexual activity: Yes    Birth control/protection: Surgical  Other Topics Concern   Not on file  Social History Narrative   2 Adults, 4 children at home   FOB involved   Social Determinants of Health   Financial Resource Strain: Not on file  Food Insecurity: No Food Insecurity (04/01/2022)   Hunger Vital Sign    Worried About Running Out of Food in the Last Year: Never true    Ran Out of Food in the Last Year: Never true  Transportation Needs: No Transportation Needs (04/01/2022)   PRAPARE - Administrator, Civil Service (Medical): No    Lack of  Transportation (Non-Medical): No  Physical Activity: Not on file  Stress: Not on file  Social Connections: Not on file   Additional Social History:    Sleep: Fair  Appetite:  Good  Current Medications: Current Facility-Administered Medications  Medication Dose Route Frequency Provider Last Rate Last Admin   acetaminophen (TYLENOL) tablet 650 mg  650 mg Oral Q6H PRN Motley-Mangrum, Jadeka A, PMHNP   650 mg at 04/04/22 1417   alum & mag hydroxide-simeth (MAALOX/MYLANTA) 200-200-20 MG/5ML suspension 30 mL  30 mL Oral Q4H PRN Motley-Mangrum, Jadeka A, PMHNP   30 mL at 04/05/22 1417   citalopram (CELEXA) tablet 20 mg  20 mg Oral Daily Massengill, Harrold Donath, MD   20 mg at 04/06/22 0916   hydrOXYzine (ATARAX) tablet 25 mg  25 mg Oral TID PRN  Motley-Mangrum, Ezra Sites, PMHNP   25 mg at 04/06/22 1256   magnesium hydroxide (MILK OF MAGNESIA) suspension 30 mL  30 mL Oral Daily PRN Motley-Mangrum, Jadeka A, PMHNP       metoprolol tartrate (LOPRESSOR) tablet 12.5 mg  12.5 mg Oral BID Motley-Mangrum, Jadeka A, PMHNP   12.5 mg at 04/06/22 0915   multivitamin with minerals tablet 1 tablet  1 tablet Oral Daily Massengill, Nathan, MD   1 tablet at 04/06/22 0916   QUEtiapine (SEROQUEL XR) 24 hr tablet 200 mg  200 mg Oral QHS Massengill, Harrold Donath, MD   200 mg at 04/05/22 2150   QUEtiapine (SEROQUEL XR) 24 hr tablet 50 mg  50 mg Oral BID Massengill, Harrold Donath, MD   50 mg at 04/06/22 1351   QUEtiapine (SEROQUEL) tablet 50 mg  50 mg Oral QHS PRN Massengill, Harrold Donath, MD       thiamine (Vitamin B-1) tablet 100 mg  100 mg Oral Daily Massengill, Nathan, MD   100 mg at 04/06/22 1751    Lab Results:  No results found for this or any previous visit (from the past 48 hour(s)).   Blood Alcohol level:  Lab Results  Component Value Date   ETH <10 03/31/2022   ETH <10 06/29/2019    Metabolic Disorder Labs: Lab Results  Component Value Date   HGBA1C 5.1 04/02/2022   MPG 100 04/02/2022   MPG 96.8 07/01/2019   Lab  Results  Component Value Date   PROLACTIN 35.2 (H) 01/09/2016   Lab Results  Component Value Date   CHOL 145 04/02/2022   TRIG 126 04/02/2022   HDL 46 04/02/2022   CHOLHDL 3.2 04/02/2022   VLDL 25 04/02/2022   LDLCALC 74 04/02/2022   LDLCALC 88 07/01/2019   Physical Findings: AIMS: 0    CIWA:  CIWA-Ar Total: 1 COWS: n/a    Musculoskeletal: Strength & Muscle Tone: within normal limits Gait & Station: normal Patient leans: N/A  Psychiatric Specialty Exam:  Presentation  General Appearance:  Appropriate for Environment; Fairly Groomed  Eye Contact: Fair  Speech: Clear and Coherent  Speech Volume: Normal  Handedness: Right   Mood and Affect  Mood: Depressed; Anxious  Affect: Congruent  Thought Process  Thought Processes: Coherent  Descriptions of Associations:Intact  Orientation:Full (Time, Place and Person)  Thought Content:Logical  History of Schizophrenia/Schizoaffective disorder:No data recorded Duration of Psychotic Symptoms:No data recorded Hallucinations:Hallucinations: None  Ideas of Reference:None  Suicidal Thoughts:Suicidal Thoughts: Yes, Passive SI Active Intent and/or Plan: Without Intent; Without Plan  Homicidal Thoughts:Homicidal Thoughts: No  Sensorium  Memory: Immediate Good  Judgment: Fair  Insight: Fair  Chartered certified accountant: Fair  Attention Span: Fair  Recall: [employer] of Knowledge: Fair  Language: Fair  Psychomotor Activity  Psychomotor Activity: Psychomotor Activity: Normal  Assets  Assets: Communication Skills  Sleep  Sleep: Sleep: Fair  Physical Exam: Physical Exam Vitals and nursing note reviewed.  Constitutional:      Appearance: Normal appearance.  HENT:     Head: Normocephalic and atraumatic.     Nose: Nose normal.     Mouth/Throat:     Mouth: Mucous membranes are moist.     Pharynx: Oropharynx is clear.  Eyes:     Extraocular Movements: Extraocular movements  intact.     Conjunctiva/sclera: Conjunctivae normal.     Pupils: Pupils are equal, round, and reactive to light.  Cardiovascular:     Rate and Rhythm: Normal rate.     Pulses: Normal  pulses.  Pulmonary:     Effort: Pulmonary effort is normal.  Abdominal:     Palpations: Abdomen is soft.  Genitourinary:    Comments: Deferred Musculoskeletal:        General: Normal range of motion.     Cervical back: Normal range of motion.  Skin:    General: Skin is warm.  Neurological:     General: No focal deficit present.     Mental Status: She is alert and oriented to person, place, and time.  Psychiatric:        Mood and Affect: Mood normal.        Behavior: Behavior normal.    Review of Systems  Constitutional: Negative.   HENT: Negative.    Eyes: Negative.   Respiratory: Negative.    Cardiovascular: Negative.   Gastrointestinal: Negative.   Genitourinary: Negative.   Musculoskeletal: Negative.   Skin: Negative.   Endo/Heme/Allergies: Negative.   Psychiatric/Behavioral:  Positive for depression, substance abuse and suicidal ideas. Negative for hallucinations and memory loss. The patient is nervous/anxious and has insomnia.    Blood pressure 100/71, pulse 85, temperature 98.6 F (37 C), temperature source Oral, resp. rate 18, height 5\' 4"  (1.626 m), weight 63.5 kg, SpO2 100 %. Body mass index is 24.03 kg/m.  Treatment Plan Summary: Daily contact with patient to assess and evaluate symptoms and progress in treatment and Medication management Observation Level/Precautions:  15 minute checks  Laboratory:  Labs reviewed   Psychotherapy:  Unit Group sessions  [facility]  [facility]  Consultations:  To be determined   Discharge Concerns:  Safety, medication compliance, mood stability  Estimated LOS: 5-7 days  Other:  N/A    Labs Reviewed on 04/05/2022: TSH WNL at 1.583, Lipid panel WNL, HA1C WNL, CMP mostly WNL with exception of glucose of 123, CBC WNL, Urine pregnancy test negative,  UA with nitrites and Leukocytes and bacteria. Pt treated for a UTI.   PLAN Safety and Monitoring: Voluntary admission to inpatient psychiatric unit for safety, stabilization and treatment Daily contact with patient to assess and evaluate symptoms and progress in treatment Patient's case to be discussed in multi-disciplinary team meeting Observation Level : q15 minute checks Vital signs: q12 hours Precautions: Safety   Long Term Goal(s): Improvement in symptoms so as ready for discharge   Short Term Goals: Ability to identify changes in lifestyle to reduce recurrence of condition will improve, Ability to disclose and discuss suicidal ideas, Ability to demonstrate self-control will improve, Ability to identify and develop effective coping behaviors will improve, Compliance with prescribed medications will improve, and Ability to identify triggers associated with substance abuse/mental health issues will improve   Physician Treatment Plan for Secondary Diagnosis:  Principal Problem:   Severe episode of recurrent major depressive disorder, with psychotic features (HCC) Active Problems:   UTI (urinary tract infection)   Alcohol use   Cannabis use disorder   GAD (generalized anxiety disorder)   Medications (Pt educated on all medications, rationales, benefits and possible side effects, and agreeable to trials) -Continue Celexa 10 mg daily for depressive symptoms (started by admitting Provider) -Continue Seroquel XR and start  50 mg in the morning, add Seroquel 50 mg XR every afternoon, and continue night dose of 200 mg p.o. q. nightly for sleep/mood stabilization -Continue Seroquel 50 mg PRN for insomnia -Continue Lopressor 12.5 mg daily for htn -Keflex 500 mg Q 8 H for UTI-ended 12/18 @ 1400 -Continue Hydroxyzine 25 mg every 6 hours PRN for anxiety -Continue  Ativan 1mg  tabs every 6 hours PRN for CIWA > 10   Other PRNS -Continue Tylenol 650 mg every 6 hours PRN for mild pain -Continue  Maalox 30 mg every 4 hrs PRN for indigestion -Continue Imodium 2-4 mg as needed for diarrhea -Continue Milk of Magnesia as needed every 6 hrs for constipation -Continue Zofran disintegrating tabs every 6 hrs PRN for nausea   Discharge Planning: Social work and case management to assist with discharge planning and identification of hospital follow-up needs prior to discharge Estimated LOS: 5-7 days Discharge Concerns: Need to establish a safety plan; Medication compliance and effectiveness Discharge Goals: Return home with outpatient referrals for mental health follow-up including medication management/psychotherapy  , NP 04/06/2022, 4:06 PMPatient ID: 04/08/2022, female   DOB: 12/10/1985, 36 y.o.   MRN: [id number] Patient ID: Grace [name], female   DOB: 12-03-1985, 36 y.o.

## 2022-04-07 ENCOUNTER — Encounter (HOSPITAL_COMMUNITY): Payer: Self-pay

## 2022-04-07 DIAGNOSIS — F333 Major depressive disorder, recurrent, severe with psychotic symptoms: Secondary | ICD-10-CM | POA: Diagnosis not present

## 2022-04-07 NOTE — BH IP Treatment Plan (Signed)
Interdisciplinary Treatment and Diagnostic Plan Update  04/07/2022 Time of Session: 10:10am  ILISSA ROSNER MRN: [id number]  Principal Diagnosis: Severe episode of recurrent major depressive disorder, with psychotic features (HCC)  Secondary Diagnoses: Principal Problem:   Severe episode of recurrent major depressive disorder, with psychotic features (HCC) Active Problems:   UTI (urinary tract infection)   Alcohol use   Cannabis use disorder   GAD (generalized anxiety disorder)   Current Medications:  Current Facility-Administered Medications  Medication Dose Route Frequency Provider Last Rate Last Admin   acetaminophen (TYLENOL) tablet 650 mg  650 mg Oral Q6H PRN Motley-Mangrum, Jadeka A, PMHNP   650 mg at 04/04/22 1417   alum & mag hydroxide-simeth (MAALOX/MYLANTA) 200-200-20 MG/5ML suspension 30 mL  30 mL Oral Q4H PRN Motley-Mangrum, Jadeka A, PMHNP   30 mL at 04/05/22 1417   citalopram (CELEXA) tablet 20 mg  20 mg Oral Daily Massengill, Harrold Donath, MD   20 mg at 04/07/22 0841   hydrOXYzine (ATARAX) tablet 25 mg  25 mg Oral TID PRN Motley-Mangrum, Jadeka A, PMHNP   25 mg at 04/06/22 2101   magnesium hydroxide (MILK OF MAGNESIA) suspension 30 mL  30 mL Oral Daily PRN Motley-Mangrum, Jadeka A, PMHNP       metoprolol tartrate (LOPRESSOR) tablet 12.5 mg  12.5 mg Oral BID Motley-Mangrum, Jadeka A, PMHNP   12.5 mg at 04/07/22 5170   multivitamin with minerals tablet 1 tablet  1 tablet Oral Daily Massengill, Nathan, MD   1 tablet at 04/07/22 0840   QUEtiapine (SEROQUEL XR) 24 hr tablet 200 mg  200 mg Oral QHS Massengill, Harrold Donath, MD   200 mg at 04/06/22 2101   QUEtiapine (SEROQUEL XR) 24 hr tablet 50 mg  50 mg Oral BID Massengill, Harrold Donath, MD   50 mg at 04/07/22 1245   QUEtiapine (SEROQUEL) tablet 50 mg  50 mg Oral QHS PRN Massengill, Harrold Donath, MD       thiamine (Vitamin B-1) tablet 100 mg  100 mg Oral Daily Massengill, Nathan, MD   100 mg at 04/07/22 0174   PTA Medications: Medications  Prior to Admission  Medication Sig Dispense Refill Last Dose   citalopram (CELEXA) 10 MG tablet Take 1 tablet (10 mg total) by mouth daily. (Patient not taking: Reported on 04/01/2022) 30 tablet 1    hydrOXYzine (ATARAX) 25 MG tablet Take 1 tablet (25 mg total) by mouth every 8 (eight) hours as needed for anxiety. (Patient not taking: Reported on 04/01/2022) 30 tablet 1    ibuprofen (ADVIL) 200 MG tablet Take 400 mg by mouth every 6 (six) hours as needed for mild pain or headache.      metoprolol tartrate (LOPRESSOR) 25 MG tablet Take 0.5 tablets (12.5 mg total) by mouth 2 (two) times daily. (Patient not taking: Reported on 04/01/2022) 30 tablet 0    QUEtiapine (SEROQUEL) 50 MG tablet Take 1 tablet (50 mg total) by mouth at bedtime. (Patient not taking: Reported on 04/01/2022) 30 tablet 1     Patient Stressors: Financial difficulties   Loss of of father around this time    Patient Strengths: Ability for insight  Average or above average intelligence  Communication skills  Supportive family/friends   Treatment Modalities: Medication Management, Group therapy, Case management,  1 to 1 session with clinician, Psychoeducation, Recreational therapy.   Physician Treatment Plan for Primary Diagnosis: Severe episode of recurrent major depressive disorder, with psychotic features (HCC) Long Term Goal(s): Improvement in symptoms so as ready for discharge  Short Term Goals: Ability to identify changes in lifestyle to reduce recurrence of condition will improve Ability to disclose and discuss suicidal ideas Ability to demonstrate self-control will improve Ability to identify and develop effective coping behaviors will improve Compliance with prescribed medications will improve Ability to identify triggers associated with substance abuse/mental health issues will improve  Medication Management: Evaluate patient's response, side effects, and tolerance of medication regimen.  Therapeutic  Interventions: 1 to 1 sessions, Unit Group sessions and Medication administration.  Evaluation of Outcomes: Progressing  Physician Treatment Plan for Secondary Diagnosis: Principal Problem:   Severe episode of recurrent major depressive disorder, with psychotic features (HCC) Active Problems:   UTI (urinary tract infection)   Alcohol use   Cannabis use disorder   GAD (generalized anxiety disorder)  Long Term Goal(s): Improvement in symptoms so as ready for discharge   Short Term Goals: Ability to identify changes in lifestyle to reduce recurrence of condition will improve Ability to disclose and discuss suicidal ideas Ability to demonstrate self-control will improve Ability to identify and develop effective coping behaviors will improve Compliance with prescribed medications will improve Ability to identify triggers associated with substance abuse/mental health issues will improve     Medication Management: Evaluate patient's response, side effects, and tolerance of medication regimen.  Therapeutic Interventions: 1 to 1 sessions, Unit Group sessions and Medication administration.  Evaluation of Outcomes: Progressing   RN Treatment Plan for Primary Diagnosis: Severe episode of recurrent major depressive disorder, with psychotic features (HCC) Long Term Goal(s): Knowledge of disease and therapeutic regimen to maintain health will improve  Short Term Goals: Ability to remain free from injury will improve, Ability to participate in decision making will improve, Ability to verbalize feelings will improve, Ability to disclose and discuss suicidal ideas, and Ability to identify and develop effective coping behaviors will improve  Medication Management: RN will administer medications as ordered by provider, will assess and evaluate patient's response and provide education to patient for prescribed medication. RN will report any adverse and/or side effects to prescribing  provider.  Therapeutic Interventions: 1 on 1 counseling sessions, Psychoeducation, Medication administration, Evaluate responses to treatment, Monitor vital signs and CBGs as ordered, Perform/monitor CIWA, COWS, AIMS and Fall Risk screenings as ordered, Perform wound care treatments as ordered.  Evaluation of Outcomes: Progressing   LCSW Treatment Plan for Primary Diagnosis: Severe episode of recurrent major depressive disorder, with psychotic features (HCC) Long Term Goal(s): Safe transition to appropriate next level of care at discharge, Engage patient in therapeutic group addressing interpersonal concerns.  Short Term Goals: Engage patient in aftercare planning with referrals and resources, Increase social support, Increase emotional regulation, Facilitate acceptance of mental health diagnosis and concerns, Identify triggers associated with mental health/substance abuse issues, and Increase skills for wellness and recovery  Therapeutic Interventions: Assess for all discharge needs, 1 to 1 time with Social worker, Explore available resources and support systems, Assess for adequacy in community support network, Educate family and significant other(s) on suicide prevention, Complete Psychosocial Assessment, Interpersonal group therapy.  Evaluation of Outcomes: Progressing   Progress in Treatment: Attending groups: Yes. Participating in groups: Yes. Taking medication as prescribed: Yes. Toleration medication: Yes. Family/Significant other contact made: Yes, individual(s) contacted:  Philis Nettle [phone] (Sister)  Patient understands diagnosis: Yes. Discussing patient identified problems/goals with staff: Yes. Medical problems stabilized or resolved: Yes. Denies suicidal/homicidal ideation: Yes. Issues/concerns per patient self-inventory: No.     New problem(s) identified: No, Describe:  None Reported  New Short Term/Long Term Goal(s):medication stabilization, elimination of  SI thoughts, development of comprehensive mental wellness plan.     Patient Goals:  " Better coping with thoughts and become a Better Me, than I was"    Discharge Plan or Barriers: Patient recently admitted. CSW will continue to follow and assess for appropriate referrals and possible discharge planning.     Reason for Continuation of Hospitalization: Anxiety Depression Medication stabilization Suicidal ideation   Estimated Length of Stay:5-7 days   Last 3 [place] Suicide Severity Risk Score: Flowsheet Row [facility] (Current[facility]31/20[facility] from [facility]/31/2022 in [facility] EM[facility]  C-SSRS RISK CATEGORY High Risk High Risk No Risk       Last PHQ 2/9 Scores:    06/11/2021    2:09 PM  Depression screen PHQ 2/9  Decreased Interest 1  Down, Depressed, Hopeless 1  PHQ - 2 Score 2  Altered sleeping 3  Tired, decreased energy 2  Change in appetite 1  Feeling bad or failure about yourself  3  Trouble concentrating 2  Moving slowly or fidgety/restless 0  Suicidal thoughts 1  PHQ-9 Score 14  Difficult doing work/chores Very difficult    Scribe for Treatment Team: Aram Beecham, Theresia Majors 04/07/2022 1:58 PM

## 2022-04-07 NOTE — Plan of Care (Signed)
  Problem: Coping: Goal: Coping ability will improve Outcome: Progressing   Problem: Medication: Goal: Compliance with prescribed medication regimen will improve Outcome: Progressing   

## 2022-04-07 NOTE — Progress Notes (Signed)
   04/07/22 0845  Charting Type  Charting Type Shift assessment  Safety Check Verification  Has the RN verified the 15 minute safety check completion? Yes  Neurological  Neuro (WDL) WDL  Neuro Symptoms Anxiety  Neuro symptoms relieved by Anti-anxiety medication  HEENT  HEENT (WDL) WDL  Respiratory  Respiratory (WDL) WDL  Cardiac  Cardiac (WDL) WDL  Vascular  Vascular (WDL) WDL  Integumentary  Integumentary (WDL) WDL  Braden Scale (Ages 8 and up)  Sensory Perceptions 4  Moisture 4  Activity 4  Mobility 4  Nutrition 3  Friction and Shear 3  Braden Scale Score 22  Musculoskeletal  Musculoskeletal (WDL) WDL  Assistive Device None  Gastrointestinal  Gastrointestinal (WDL) WDL  GU Assessment  Genitourinary (WDL) WDL  Genitalia  Female Genitalia Intact  Neurological  Level of Consciousness Alert

## 2022-04-07 NOTE — Progress Notes (Signed)
[facility] MD Progress Note  04/07/2022 6:05 PM Grace [name]  MRN:  [id number] Principal Problem: Severe episode of recurrent major depressive disorder, with psychotic features (HCC) Diagnosis: Principal Problem:   Severe episode of recurrent major depressive disorder, with psychotic features (HCC) Active Problems:   UTI (urinary tract infection)   Alcohol use   Cannabis use disorder   GAD (generalized anxiety disorder)   Reason For Admission:  Grace [name] is a 36 yo AAF with a history of MDD & THC abuse who presented to the [facility] after taking 5 tabs of Advil with an intent to end her life in the context of multiple stressors. Pt was medically stabilized and transferred to this [facility] voluntarily for treatment and stabilization of her mood.   24 hr chart review: V/S for the past 24 hrs WNL. Pt has been compliant with her scheduled medications, and required Hydroxyzine 25 mg last night. Pt attended some unit group sessions yesterday, and has been seen in the day room today, attending more group sessions.  Today's patient assessment note: Pt with a less depressed mood, affect is congruent. Her attention to personal hygiene and grooming is fair, eye contact is good, speech is clear & coherent. Thought contents are organized and logical, and pt currently denies SI/HI/AVH. She reports being "afraid that something bad will happen". She reports that she typically feels this way, but is also thinking that there are people out to harm her, but states it is no one in particular that she has in mine. Pt reports a good sleep quality last night, reports a fair appetite, denies being in any physical pain/distress.  Yesterday, we added Seroquel XR 50 mg to her afternoon medication regimen in addition to the 50 mg that she is getting in the mornings, and the 200 mg nightly. We are continuing these medications along with other medications as listed below. No TD/EPS type symptoms found on assessment, and  pt denies any feelings of stiffness. AIMS: 0.   Total Time spent with patient: 30 minutes  Past Psychiatric History: Previous Psych Diagnoses: MDD, GAD Prior inpatient treatment: Here at [facility] on 03/16/2015, 01/05/2016, 06/30/2019 Current/prior outpatient treatment: Non at this time Prior rehab hx: None Psychotherapy [id number] History of suicide attempts: 06/2019 leading to hospitalization here at that time History of homicide or aggression: Denies  Home [id number] 0.5 mg BID, Tapazole 10 mg BID Prior Hosp: Just for childbirth per pt Prior Surgeries/Trauma: Denies  Head trauma, LOC, concussions, seizures: Denies Allergies: Denies  LMP: 03/19/22 Contraception: Denies use  PCP: Denies having one  Past Medical History:  Medical Diagnoses: Hyperthyroidism Past Medical History:  Diagnosis Date   Cholecystitis 08/18/2011   Headache(784.0)    otc med prn   History of recurrent UTIs    recurrent UTIs   Hypertension    Hyperthyroidism    Joint pain    knees and shoulders - otc med prn   Sickle cell trait (HCC)    Sickle cell trait (HCC)    SVD (spontaneous vaginal delivery)    x 3 WH    Past Surgical History:  Procedure Laterality Date   CHOLECYSTECTOMY  08/19/2011   Procedure: LAPAROSCOPIC CHOLECYSTECTOMY WITH INTRAOPERATIVE CHOLANGIOGRAM;  Surgeon: Lodema Pilot, DO;  Location: [facility] OR;  Service: General;  Laterality: N/A;   DILATION AND CURETTAGE OF UTERUS     molar preg   LAPAROSCOPIC BILATERAL SALPINGECTOMY Bilateral 05/09/2013   Procedure: LAPAROSCOPIC BILATERAL SALPINGECTOMY;  Surgeon: Allie Bossier, MD;  Location: [facility]  ORS;  Service: Gynecology;  Laterality: Bilateral;   Family History:  Family History  Problem Relation Age of Onset   Diabetes Mother    Cancer Father    Diabetes Maternal Uncle    Cancer Maternal Grandmother    Cancer Maternal Grandfather    Hypertension Maternal Grandfather    Diabetes Maternal Grandfather    Sickle cell anemia Son    Asthma Son    Heart  disease Paternal Grandfather    Hearing loss Paternal Aunt    Family Psychiatric  History: Psych: Mother with MDD, maternal aunt with bipolar d/o Psych Rx: Unsure SA/HA: Denies Substance use family hx: Denies     Social History:  Social History   Substance and Sexual Activity  Alcohol Use No     Social History   Substance and Sexual Activity  Drug Use Yes   Types: Marijuana   Comment: occasional    Social History   Socioeconomic History   Marital status: Single    Spouse name: Not on file   Number of children: Not on file   Years of education: Not on file   Highest education level: Not on file  Occupational History   Not on file  Tobacco Use   Smoking status: Never   Smokeless tobacco: Never  Vaping Use   Vaping Use: Never used  Substance and Sexual Activity   Alcohol use: No   Drug use: Yes    Types: Marijuana    Comment: occasional   Sexual activity: Yes    Birth control/protection: Surgical  Other Topics Concern   Not on file  Social History Narrative   2 Adults, 4 children at home   FOB involved   Social Determinants of Health   Financial Resource Strain: Not on file  Food Insecurity: No Food Insecurity (04/01/2022)   Hunger Vital Sign    Worried About Running Out of Food in the Last Year: Never true    Ran Out of Food in the Last Year: Never true  Transportation Needs: No Transportation Needs (04/01/2022)   PRAPARE - Administrator, Civil Service (Medical): No    Lack of Transportation (Non-Medical): No  Physical Activity: Not on file  Stress: Not on file  Social Connections: Not on file   Additional Social History:    Sleep: Fair  Appetite:  Good  Current Medications: Current Facility-Administered Medications  Medication Dose Route Frequency Provider Last Rate Last Admin   acetaminophen (TYLENOL) tablet 650 mg  650 mg Oral Q6H PRN Motley-Mangrum, Jadeka A, PMHNP   650 mg at 04/04/22 1417   alum & mag hydroxide-simeth  (MAALOX/MYLANTA) 200-200-20 MG/5ML suspension 30 mL  30 mL Oral Q4H PRN Motley-Mangrum, Jadeka A, PMHNP   30 mL at 04/05/22 1417   citalopram (CELEXA) tablet 20 mg  20 mg Oral Daily Massengill, Harrold Donath, MD   20 mg at 04/07/22 0841   hydrOXYzine (ATARAX) tablet 25 mg  25 mg Oral TID PRN Motley-Mangrum, Jadeka A, PMHNP   25 mg at 04/06/22 2101   magnesium hydroxide (MILK OF MAGNESIA) suspension 30 mL  30 mL Oral Daily PRN Motley-Mangrum, Jadeka A, PMHNP       metoprolol tartrate (LOPRESSOR) tablet 12.5 mg  12.5 mg Oral BID Motley-Mangrum, Jadeka A, PMHNP   12.5 mg at 04/07/22 1720   multivitamin with minerals tablet 1 tablet  1 tablet Oral Daily Massengill, Nathan, MD   1 tablet at 04/07/22 0840   QUEtiapine (SEROQUEL XR) 24 hr tablet  200 mg  200 mg Oral QHS Massengill, Nathan, MD   200 mg at 04/06/22 2101   QUEtiapine (SEROQUEL XR) 24 hr tablet 50 mg  50 mg Oral BID Massengill, Harrold DonathNathan, MD   50 mg at 04/07/22 1245   QUEtiapine (SEROQUEL) tablet 50 mg  50 mg Oral QHS PRN Massengill, Harrold DonathNathan, MD       thiamine (Vitamin B-1) tablet 100 mg  100 mg Oral Daily Massengill, Nathan, MD   100 mg at 04/07/22 16100839    Lab Results:  No results found for this or any previous visit (from the past 48 hour(s)).   Blood Alcohol level:  Lab Results  Component Value Date   ETH <10 03/31/2022   ETH <10 06/29/2019    Metabolic Disorder Labs: Lab Results  Component Value Date   HGBA1C 5.1 04/02/2022   MPG 100 04/02/2022   MPG 96.8 07/01/2019   Lab Results  Component Value Date   PROLACTIN 35.2 (H) 01/09/2016   Lab Results  Component Value Date   CHOL 145 04/02/2022   TRIG 126 04/02/2022   HDL 46 04/02/2022   CHOLHDL 3.2 04/02/2022   VLDL 25 04/02/2022   LDLCALC 74 04/02/2022   LDLCALC 88 07/01/2019   Physical Findings: AIMS: 0    CIWA:  CIWA-Ar Total: 0 COWS: n/a    Musculoskeletal: Strength & Muscle Tone: within normal limits Gait & Station: normal Patient leans: N/A  Psychiatric Specialty  Exam:  Presentation  General Appearance:  Appropriate for Environment; Fairly Groomed  Eye Contact: Fair  Speech: Clear and Coherent  Speech Volume: Normal  Handedness: Right   Mood and Affect  Mood: Depressed  Affect: Congruent  Thought Process  Thought Processes: Coherent  Descriptions of Associations:Intact  Orientation:Full (Time, Place and Person)  Thought Content:Logical  History of Schizophrenia/Schizoaffective disorder:No data recorded Duration of Psychotic Symptoms:No data recorded Hallucinations:Hallucinations: None  Ideas of Reference:Paranoia  Suicidal Thoughts:Suicidal Thoughts: No  Homicidal Thoughts:Homicidal Thoughts: No  Sensorium  Memory: Immediate Good  Judgment: Fair  Insight: Fair  Chartered certified accountantxecutive Functions  Concentration: Fair  Attention Span: Fair  Recall: Good  Fund of Knowledge: Good  Language: Good  Psychomotor Activity  Psychomotor Activity: Psychomotor Activity: Normal  Assets  Assets: Housing  Sleep  Sleep: Sleep: Good  Physical Exam: Physical Exam Vitals and nursing note reviewed.  Constitutional:      Appearance: Normal appearance.  HENT:     Head: Normocephalic and atraumatic.     Nose: Nose normal.     Mouth/Throat:     Mouth: Mucous membranes are moist.     Pharynx: Oropharynx is clear.  Eyes:     Extraocular Movements: Extraocular movements intact.     Conjunctiva/sclera: Conjunctivae normal.     Pupils: Pupils are equal, round, and reactive to light.  Cardiovascular:     Rate and Rhythm: Normal rate.     Pulses: Normal pulses.  Pulmonary:     Effort: Pulmonary effort is normal.  Abdominal:     Palpations: Abdomen is soft.  Genitourinary:    Comments: Deferred Musculoskeletal:        General: Normal range of motion.     Cervical back: Normal range of motion.  Skin:    General: Skin is warm.  Neurological:     General: No focal deficit present.     Mental Status: She is alert  and oriented to person, place, and time.  Psychiatric:        Mood and Affect: Mood normal.  Behavior: Behavior normal.    Review of Systems  Constitutional: Negative.   HENT: Negative.    Eyes: Negative.   Respiratory: Negative.    Cardiovascular: Negative.   Gastrointestinal: Negative.   Genitourinary: Negative.   Musculoskeletal: Negative.   Skin: Negative.   Endo/Heme/Allergies: Negative.   Psychiatric/Behavioral:  Positive for depression and substance abuse. Negative for hallucinations, memory loss and suicidal ideas. The patient is nervous/anxious. The patient does not have insomnia.    Blood pressure 109/71, pulse 73, temperature 98.3 F (36.8 C), temperature source Oral, resp. rate 16, height 5\' 4"  (1.626 m), weight 63.5 kg, SpO2 100 %. Body mass index is 24.03 kg/m.  Treatment Plan Summary: Daily contact with patient to assess and evaluate symptoms and progress in treatment and Medication management Observation Level/Precautions:  15 minute checks  Laboratory:  Labs reviewed   Psychotherapy:  Unit Group sessions  Medications:  See [facility]  Consultations:  To be determined   Discharge Concerns:  Safety, medication compliance, mood stability  Estimated LOS: 5-7 days  Other:  N/A    Labs Reviewed on 04/05/2022: TSH WNL at 1.583, Lipid panel WNL, HA1C WNL, CMP mostly WNL with exception of glucose of 123, CBC WNL, Urine pregnancy test negative, UA with nitrites and Leukocytes and bacteria. Pt treated for a UTI.   PLAN Safety and Monitoring: Voluntary admission to inpatient psychiatric unit for safety, stabilization and treatment Daily contact with patient to assess and evaluate symptoms and progress in treatment Patient's case to be discussed in multi-disciplinary team meeting Observation Level : q15 minute checks Vital signs: q12 hours Precautions: Safety   Long Term Goal(s): Improvement in symptoms so as ready for discharge   Short Term Goals: Ability to  identify changes in lifestyle to reduce recurrence of condition will improve, Ability to disclose and discuss suicidal ideas, Ability to demonstrate self-control will improve, Ability to identify and develop effective coping behaviors will improve, Compliance with prescribed medications will improve, and Ability to identify triggers associated with substance abuse/mental health issues will improve   Physician Treatment Plan for Secondary Diagnosis:  Principal Problem:   Severe episode of recurrent major depressive disorder, with psychotic features (HCC) Active Problems:   UTI (urinary tract infection)   Alcohol use   Cannabis use disorder   GAD (generalized anxiety disorder)   Medications (Pt educated on all medications, rationales, benefits and possible side effects, and agreeable to trials) -Continue Celexa 10 mg daily for depressive symptoms (started by admitting Provider) -Continue Seroquel XR and start  50 mg in the morning, add Seroquel 50 mg XR every afternoon, and continue night dose of 200 mg p.o. q. nightly for sleep/mood stabilization -Continue Seroquel 50 mg PRN for insomnia -Continue Lopressor 12.5 mg daily for htn -Keflex 500 mg Q 8 H for UTI-ended 12/18 @ 1400 -Continue Hydroxyzine 25 mg every 6 hours PRN for anxiety -Continue Ativan 1mg  tabs every 6 hours PRN for CIWA > 10   Other PRNS -Continue Tylenol 650 mg every 6 hours PRN for mild pain -Continue Maalox 30 mg every 4 hrs PRN for indigestion -Continue Imodium 2-4 mg as needed for diarrhea -Continue Milk of Magnesia as needed every 6 hrs for constipation -Continue Zofran disintegrating tabs every 6 hrs PRN for nausea   Discharge Planning: Social work and case management to assist with discharge planning and identification of hospital follow-up needs prior to discharge Estimated LOS: 5-7 days Discharge Concerns: Need to establish a safety plan; Medication compliance and effectiveness Discharge Goals:  Return home with  outpatient referrals for mental health follow-up including medication management/psychotherapy  Starleen Blue, NP 04/07/2022, 6:05 PMPatient ID: [name], female   DOB: 01-07-1986, 36 y.o.   MRN: [id number] Patient ID: DESIREY KEAHEY, female   DOB: 1985/07/09, 36 y.o.   Patient ID: NIYAH MAMARIL, female   DOB: 1986/04/04, 36 y.o.   MRN: [id number]

## 2022-04-07 NOTE — [facility] Group Notes (Signed)
Patient attended the NA group. ?

## 2022-04-07 NOTE — [facility] Group Notes (Signed)
Patient did not attend goals group.  

## 2022-04-07 NOTE — Plan of Care (Signed)
  Problem: Self-Concept: Goal: Ability to identify factors that promote anxiety will improve Outcome: Progressing Goal: Level of anxiety will decrease Outcome: Progressing Goal: Ability to modify response to factors that promote anxiety will improve Outcome: Progressing  Patient endorsing Passive  SI with no plan stated she had a better day today. Compliant with medications no adverse effects reported. Support and encouragement provided. Q 15 minutes safety checks ongoing without self harm gestures.

## 2022-04-07 NOTE — Group Note (Signed)
Recreation Therapy Group Note   Group Topic:Stress Management  Group Date: 04/07/2022 Start Time: 0935 End Time: 0952 Facilitators: Julez Huseby-McCall, LRT,CTRS Location: [address] Dayroom   Goal Area(s) Addresses:  Patient will actively participate in stress management techniques presented during session.  Patient will successfully identify benefit of practicing stress management post d/c.   Group Description: Guided Imagery. LRT provided education, instruction, and demonstration on practice of visualization via guided imagery. Patient was asked to participate in the technique introduced during session. LRT debriefed including topics of mindfulness, stress management and specific scenarios each patient could use these techniques. Patients were given suggestions of ways to access scripts post d/c and encouraged to explore Youtube and other apps available on smartphones, tablets, and computers.   Affect/Mood: N/A   Participation Level: Did not attend    Clinical Observations/Individualized Feedback:      Plan: Continue to engage patient in RT group sessions 2-3x/week.   Tori Dattilio-McCall, LRT,CTRS 04/07/2022 12:41 PM

## 2022-04-08 DIAGNOSIS — F333 Major depressive disorder, recurrent, severe with psychotic symptoms: Secondary | ICD-10-CM | POA: Diagnosis not present

## 2022-04-08 MED ORDER — CITALOPRAM HYDROBROMIDE 10 MG PO TABS
10.0000 mg | ORAL_TABLET | Freq: Every day | ORAL | Status: AC
  Administered 2022-04-08: 10 mg via ORAL
  Filled 2022-04-08 (×2): qty 1

## 2022-04-08 MED ORDER — QUETIAPINE FUMARATE ER 300 MG PO TB24
300.0000 mg | ORAL_TABLET | Freq: Every day | ORAL | Status: DC
  Administered 2022-04-08 – 2022-04-12 (×5): 300 mg via ORAL
  Filled 2022-04-08 (×2): qty 10
  Filled 2022-04-08: qty 1
  Filled 2022-04-08: qty 10
  Filled 2022-04-08: qty 1
  Filled 2022-04-08 (×2): qty 10

## 2022-04-08 MED ORDER — CITALOPRAM HYDROBROMIDE 40 MG PO TABS
40.0000 mg | ORAL_TABLET | Freq: Every day | ORAL | Status: DC
  Administered 2022-04-09 – 2022-04-13 (×5): 40 mg via ORAL
  Filled 2022-04-08: qty 10
  Filled 2022-04-08: qty 1
  Filled 2022-04-08: qty 10
  Filled 2022-04-08: qty 1
  Filled 2022-04-08 (×3): qty 10

## 2022-04-08 NOTE — Progress Notes (Addendum)
[facility]  04/08/2022 4:47 PM Grace [name]  MRN:  [id number] Principal Problem: Severe episode of recurrent major depressive disorder, with psychotic features (HCC) Diagnosis: Principal Problem:   Severe episode of recurrent major depressive disorder, with psychotic features (HCC) Active Problems:   UTI (urinary tract infection)   Alcohol use   Cannabis use disorder   GAD (generalized anxiety disorder)   Reason For Admission:  Grace [name] is a 36 yo AAF with a history of MDD & THC abuse who [facility] after taking 5 tabs of Advil with an intent to end her life in the context of multiple stressors. Pt was medically stabilized and [facility] for treatment and stabilization of her mood.   24 hr chart review: V/S for the past 24 hrs WNL. Pt has been compliant with her scheduled medications, and did not require any PRN medications overnight. Pt attended some unit group sessions yesterday, and has been mostly isolative to her room today.  Today's patient assessment note: Today, pt continues to state that she feels "like something is going to happen to me", but states that she does no longer feel as though there are people outside of this hospital to get her. She reports that she had suicide ideations with no plan early today morning, but the thoughts resolved as the afternoon approached. Objectively, pt is less depressed, and less anxious. Her attention to personal hygiene and grooming is fair, eye contact is good, speech is clear & coherent. Thought contents are organized and logical. She reports a good sleep quality last night, reports a fair appetite, denies being in any physical pain.    No TD/EPS type symptoms found on assessment, and pt denies any feelings of stiffness. AIMS: 0. Pt reports that Seroquel is helping with her mood and also with anxiety. We are increasing Celexa to 30 mg starting today, and further increase to 40 mg  starting tomorrow to help with depressive symptoms. We are continuing other medications as listed below. Pt reports that she does not feel quite ready for discharge yet, and has been educated that he will be discharged on Saturday, 12/23 and verbalizes understanding.  Total Time spent with patient: 30 minutes  Past Psychiatric History: Previous Psych Diagnoses: MDD, GAD [facility]2016, 06/30/2019 Current/prior outpatient treatment: Non at this time Prior rehab hx: None Psychotherapy NG:EXBMhx:None History of suicide attempts: 06/2019 leading to hospitalization here at that time History of homicide or aggression: Denies  Home WU:[id number]:Lopressor 0.5 mg BID, Tapazole 10 mg BID Prior Hosp: Just for childbirth per pt Prior Surgeries/Trauma: Denies  Head trauma, LOC, concussions, seizures: Denies Allergies: Denies  LMP: 03/19/22 Contraception: Denies use  PCP: Denies having one  Past Medical History:  Medical Diagnoses: Hyperthyroidism Past Medical History:  Diagnosis Date   Cholecystitis 08/18/2011   Headache(784.0)    otc med prn   History of recurrent UTIs    recurrent UTIs   Hypertension    Hyperthyroidism    Joint pain    knees and shoulders - otc med prn   Sickle cell trait (HCC)    Sickle cell trait (HCC)    SVD (spontaneous vaginal delivery)    x 3 [facility]    Past Surgical History:  Procedure Laterality Date   CHOLECYSTECTOMY  08/19/2011   Procedure: LAPAROSCOPIC CHOLECYSTECTOMY WITH INTRAOPERATIVE CHOLANGIOGRAM;  Surgeon: Lodema PilotBrian Layton, DO;  Location: [facility] OR;  Service: General;  Laterality: N/A;  DILATION AND CURETTAGE OF UTERUS     molar preg   LAPAROSCOPIC BILATERAL SALPINGECTOMY Bilateral 05/09/2013   Procedure: LAPAROSCOPIC BILATERAL SALPINGECTOMY;  Surgeon: Allie Bossier, MD;  Location: [facility] ORS;  Service: Gynecology;  Laterality: Bilateral;   Family History:  Family History  Problem Relation Age of Onset   Diabetes Mother    Cancer  Father    Diabetes Maternal Uncle    Cancer Maternal Grandmother    Cancer Maternal Grandfather    Hypertension Maternal Grandfather    Diabetes Maternal Grandfather    Sickle cell anemia Son    Asthma Son    Heart disease Paternal Grandfather    Hearing loss Paternal Aunt    Family Psychiatric  History: Psych: Mother with MDD, maternal aunt with bipolar d/o Psych Rx: Unsure SA/HA: Denies Substance use family hx: Denies     Social History:  Social History   Substance and Sexual Activity  Alcohol Use No     Social History   Substance and Sexual Activity  Drug Use Yes   Types: Marijuana   Comment: occasional    Social History   Socioeconomic History   Marital status: Single    Spouse name: Not on file   Number of children: Not on file   Years of education: Not on file   Highest education level: Not on file  Occupational History   Not on file  Tobacco Use   Smoking status: Never   Smokeless tobacco: Never  Vaping Use   Vaping Use: Never used  Substance and Sexual Activity   Alcohol use: No   Drug use: Yes    Types: Marijuana    Comment: occasional   Sexual activity: Yes    Birth control/protection: Surgical  Other Topics Concern   Not on file  Social History Narrative   2 Adults, 4 children at home   FOB involved   Social Determinants of Health   Financial Resource Strain: Not on file  Food Insecurity: No Food Insecurity (04/01/2022)   Hunger Vital Sign    Worried About Running Out of Food in the Last Year: Never true    Ran Out of Food in the Last Year: Never true  Transportation Needs: No Transportation Needs (04/01/2022)   PRAPARE - Administrator, Civil Service (Medical): No    Lack of Transportation (Non-Medical): No  Physical Activity: Not on file  Stress: Not on file  Social Connections: Not on file   Additional Social History:    Sleep: Fair  Appetite:  Good  Current Medications: Current Facility-Administered Medications   Medication Dose Route Frequency Provider Last Rate Last Admin   acetaminophen (TYLENOL) tablet 650 mg  650 mg Oral Q6H PRN Motley-Mangrum, Jadeka A, PMHNP   650 mg at 04/04/22 1417   alum & mag hydroxide-simeth (MAALOX/MYLANTA) 200-200-20 MG/5ML suspension 30 mL  30 mL Oral Q4H PRN Motley-Mangrum, Jadeka A, PMHNP   30 mL at 04/05/22 1417   [START ON 04/09/2022] citalopram (CELEXA) tablet 40 mg  40 mg Oral Daily Massengill, Nathan, MD       hydrOXYzine (ATARAX) tablet 25 mg  25 mg Oral TID PRN Motley-Mangrum, Jadeka A, PMHNP   25 mg at 04/07/22 2117   magnesium hydroxide (MILK OF MAGNESIA) suspension 30 mL  30 mL Oral Daily PRN Motley-Mangrum, Jadeka A, PMHNP       metoprolol tartrate (LOPRESSOR) tablet 12.5 mg  12.5 mg Oral BID Motley-Mangrum, Jadeka A, PMHNP   12.5 mg at  04/08/22 1641   multivitamin with minerals tablet 1 tablet  1 tablet Oral Daily Massengill, Nathan, MD   1 tablet at 04/08/22 9485   QUEtiapine (SEROQUEL XR) 24 hr tablet 300 mg  300 mg Oral QHS Massengill, Nathan, MD       QUEtiapine (SEROQUEL XR) 24 hr tablet 50 mg  50 mg Oral BID Massengill, Harrold Donath, MD   50 mg at 04/08/22 1356   QUEtiapine (SEROQUEL) tablet 50 mg  50 mg Oral QHS PRN Massengill, Harrold Donath, MD   50 mg at 04/07/22 2254   thiamine (Vitamin B-1) tablet 100 mg  100 mg Oral Daily Massengill, Harrold Donath, MD   100 mg at 04/08/22 4627    Lab Results:  No results found for this or any previous visit (from the past 48 hour(s)).   Blood Alcohol level:  Lab Results  Component Value Date   ETH <10 03/31/2022   ETH <10 06/29/2019    Metabolic Disorder Labs: Lab Results  Component Value Date   HGBA1C 5.1 04/02/2022   MPG 100 04/02/2022   MPG 96.8 07/01/2019   Lab Results  Component Value Date   PROLACTIN 35.2 (H) 01/09/2016   Lab Results  Component Value Date   CHOL 145 04/02/2022   TRIG 126 04/02/2022   HDL 46 04/02/2022   CHOLHDL 3.2 04/02/2022   VLDL 25 04/02/2022   LDLCALC 74 04/02/2022   LDLCALC 88  07/01/2019   Physical Findings: AIMS: 0    CIWA:  CIWA-Ar Total: 0 COWS: n/a    Musculoskeletal: Strength & Muscle Tone: within normal limits Gait & Station: normal Patient leans: N/A  Psychiatric Specialty Exam:  Presentation  General Appearance:  Appropriate for Environment; Casual  Eye Contact: Good  Speech: Clear and Coherent  Speech Volume: Normal  Handedness: Right   Mood and Affect  Mood: Depressed  Affect: Congruent  Thought Process  Thought Processes: Coherent  Descriptions of Associations:Intact  Orientation:Full (Time, Place and Person)  Thought Content:Logical  History of Schizophrenia/Schizoaffective disorder:No data recorded Duration of Psychotic Symptoms:No data recorded Hallucinations:Hallucinations: None  Ideas of Reference:Paranoia  Suicidal Thoughts:Suicidal Thoughts: No  Homicidal Thoughts:Homicidal Thoughts: No  Sensorium  Memory: Immediate Good  Judgment: Fair  Insight: Fair  Art therapist  Concentration: Good  Attention Span: Good  Recall: Good  Fund of Knowledge: Fair  Language: Fair  Psychomotor Activity  Psychomotor Activity: Psychomotor Activity: Normal  Assets  Assets: Communication Skills; Social Support  Sleep  Sleep: Sleep: Good  Physical Exam: Physical Exam Vitals and nursing note reviewed.  Constitutional:      Appearance: Normal appearance.  HENT:     Head: Normocephalic and atraumatic.     Nose: Nose normal.     Mouth/Throat:     Mouth: Mucous membranes are moist.     Pharynx: Oropharynx is clear.  Eyes:     Extraocular Movements: Extraocular movements intact.     Conjunctiva/sclera: Conjunctivae normal.     Pupils: Pupils are equal, round, and reactive to light.  Cardiovascular:     Rate and Rhythm: Normal rate.     Pulses: Normal pulses.  Pulmonary:     Effort: Pulmonary effort is normal.  Abdominal:     Palpations: Abdomen is soft.  Genitourinary:     Comments: Deferred Musculoskeletal:        General: Normal range of motion.     Cervical back: Normal range of motion.  Skin:    General: Skin is warm.  Neurological:  General: No focal deficit present.     Mental Status: She is alert and oriented to person, place, and time.  Psychiatric:        Mood and Affect: Mood normal.        Behavior: Behavior normal.    Review of Systems  Constitutional: Negative.   HENT: Negative.    Eyes: Negative.   Respiratory: Negative.    Cardiovascular: Negative.   Gastrointestinal: Negative.   Genitourinary: Negative.   Musculoskeletal: Negative.   Skin: Negative.   Endo/Heme/Allergies: Negative.   Psychiatric/Behavioral:  Positive for depression and substance abuse. Negative for hallucinations, memory loss and suicidal ideas. The patient is nervous/anxious. The patient does not have insomnia.    Blood pressure 117/65, pulse 92, temperature 98.6 F (37 C), temperature source Oral, resp. rate 16, height 5\' 4"  (1.626 m), weight 63.5 kg, SpO2 100 %. Body mass index is 24.03 kg/m.  Treatment Plan Summary: Daily contact with patient to assess and evaluate symptoms and progress in treatment and Medication management Observation Level/Precautions:  15 minute checks  Laboratory:  Labs reviewed   Psychotherapy:  Unit Group sessions  Medications:  See [facility]  Consultations:  To be determined   Discharge Concerns:  Safety, medication compliance, mood stability  Estimated LOS: 5-7 days  Other:  N/A    Labs Reviewed on 04/05/2022: TSH WNL at 1.583, Lipid panel WNL, HA1C WNL, CMP mostly WNL with exception of glucose of 123, CBC WNL, Urine pregnancy test negative, UA with nitrites and Leukocytes and bacteria. Pt treated for a UTI.   PLAN Safety and Monitoring: Voluntary admission to inpatient psychiatric unit for safety, stabilization and treatment Daily contact with patient to assess and evaluate symptoms and progress in treatment Patient's case to  be discussed in multi-disciplinary team meeting Observation Level : q15 minute checks Vital signs: q12 hours Precautions: Safety   Long Term Goal(s): Improvement in symptoms so as ready for discharge   Short Term Goals: Ability to identify changes in lifestyle to reduce recurrence of condition will improve, Ability to disclose and discuss suicidal ideas, Ability to demonstrate self-control will improve, Ability to identify and develop effective coping behaviors will improve, Compliance with prescribed medications will improve, and Ability to identify triggers associated with substance abuse/mental health issues will improve   Physician Treatment Plan for Secondary Diagnosis:  Principal Problem:   Severe episode of recurrent major depressive disorder, with psychotic features (HCC) Active Problems:   UTI (urinary tract infection)   Alcohol use   Cannabis use disorder   GAD (generalized anxiety disorder)   Medications (Pt educated on all medications, rationales, benefits and possible side effects, and agreeable to trials) -Increase Celexa 30 mg daily for depressive symptoms (started by admitting Provider) -Continue Seroquel XR 50 mg in the morning, Seroquel 50 mg XR every afternoon, and continue night dose of 200 mg p.o. q. nightly for sleep/mood stabilization -Continue Seroquel 50 mg PRN for insomnia -Continue Lopressor 12.5 mg daily for htn -Keflex 500 mg Q 8 H for UTI-ended 12/18 @ 1400 -Continue Hydroxyzine 25 mg every 6 hours PRN for anxiety -Continue Ativan 1mg  tabs every 6 hours PRN for CIWA > 10   Other PRNS -Continue Tylenol 650 mg every 6 hours PRN for mild pain -Continue Maalox 30 mg every 4 hrs PRN for indigestion -Continue Imodium 2-4 mg as needed for diarrhea -Continue Milk of Magnesia as needed every 6 hrs for constipation -Continue Zofran disintegrating tabs every 6 hrs PRN for nausea   Discharge  Planning: Social work and case management to assist with discharge  planning and identification of hospital follow-up needs prior to discharge Estimated LOS: 5-7 days Discharge Concerns: Need to establish a safety plan; Medication compliance and effectiveness Discharge Goals: Return home with outpatient referrals for mental health follow-up including medication management/psychotherapy  Grace Blue, Grace [name] 04/08/2022, 4:47 PMPatient ID: Grace [name], female   DOB: [birth year], 36 y.o.   MRN: [id number] Patient ID: Grace [name], female   DOB: January 16, 1986, 36 y.o.   Patient ID: Grace [name], female   DOB: March 03, 1986, 36 y.o.   MRN: [id number] Patient ID: Grace [name], female   DOB: 09-27-1985, 36 y.o.   MRN: [id number]

## 2022-04-08 NOTE — Progress Notes (Signed)
    04/08/22 2109  Psych Admission Type (Psych Patients Only)  Admission Status Voluntary  Psychosocial Assessment  Patient Complaints Anxiety;Depression  Eye Contact Fair  Facial Expression Flat  Affect Depressed  Speech Logical/coherent  Interaction Assertive  Motor Activity Other (Comment) (WDL)  Appearance/Hygiene Unremarkable  Behavior Characteristics Appropriate to situation  Mood Depressed;Anxious  Thought Process  Coherency WDL  Content WDL  Delusions None reported or observed  Perception WDL  Hallucination None reported or observed  Judgment Impaired  Confusion None  Danger to Self  Current suicidal ideation? Passive  Self-Injurious Behavior Some self-injurious ideation observed or expressed.  No lethal plan expressed   Agreement Not to Harm Self Yes  Description of Agreement verbal  Danger to Others  Danger to Others None reported or observed

## 2022-04-08 NOTE — Progress Notes (Signed)
The patient rated her day as a 1 out of 10 but would not elaborate any further. Her positive event for the day was that she met with her doctor.

## 2022-04-08 NOTE — Plan of Care (Signed)
  Problem: Coping: Goal: Coping ability will improve Outcome: Progressing   Problem: Medication: Goal: Compliance with prescribed medication regimen will improve Outcome: Progressing   Problem: Activity: Goal: Interest or engagement in activities will improve Outcome: Progressing

## 2022-04-08 NOTE — Group Note (Signed)
LCSW Group Therapy Note   Group Date: 04/08/2022 Start Time: 1100 End Time: 1200  Type of Therapy and Topic:  Group Therapy:  Setting Goals   Participation Level:  Did not attend    Description of Group: In this process group, patients discussed using strengths to work toward goals and address challenges.  Patients identified two positive things about themselves and one goal they were working on.  Patients were given the opportunity to share openly and support each other's plan for self-empowerment.  The group discussed the value of gratitude and were encouraged to have a daily reflection of positive characteristics or circumstances.  Patients were encouraged to identify a plan to utilize their strengths to work on current challenges and goals.   Therapeutic Goals Patient will verbalize personal strengths/positive qualities and relate how these can assist with achieving desired personal goals Patients will verbalize affirmation of peers plans for personal change and goal setting Patients will explore the value of gratitude and positive focus as related to successful achievement of goals Patients will verbalize a plan for regular reinforcement of personal positive qualities and circumstances.   Summary of Patient Progress: Did not attend      Therapeutic Modalities Cognitive Behavioral Therapy Motivational Interviewing  Aram Beecham, Theresia Majors 04/08/2022  1:43 PM

## 2022-04-08 NOTE — Progress Notes (Addendum)
D: Pt reported passive SI this morning, no lethal plan expressed at this time. Pt verbally contracts for safety. Pt denied HI/AVH. Pt rated her depression a 5/10 and her anxiety a 5/10. Pt reports that she slept "ok" last night but needed some medications to help her fall asleep. Pt has been pleasant, calm, and cooperative throughout the shift.   A: RN provided support and encouragement to patient. Pt given scheduled medications as prescribed. Held pt's morning dose of Metoprolol due to a low BP of 96/70. EKG completed. Q15 min checks verified for safety.    R: Patient verbally contracts for safety. Patient compliant with medications and treatment plan. Patient is interacting well on the unit. Pt is safe on the unit.   04/08/22 0944  Psych Admission Type (Psych Patients Only)  Admission Status Voluntary  Psychosocial Assessment  Patient Complaints Anxiety;Depression  Eye Contact Fair  Facial Expression Flat  Affect Depressed  Speech Logical/coherent  Interaction Assertive  Motor Activity Other (Comment) (WDL)  Appearance/Hygiene Unremarkable  Behavior Characteristics Appropriate to situation;Cooperative  Mood Depressed;Anxious  Thought Process  Coherency WDL  Content WDL  Delusions None reported or observed  Perception WDL  Hallucination None reported or observed  Judgment Impaired  Confusion None  Danger to Self  Current suicidal ideation? Passive  Self-Injurious Behavior Some self-injurious ideation observed or expressed.  No lethal plan expressed   Agreement Not to Harm Self Yes  Description of Agreement Pt verbally contracts for safety  Danger to Others  Danger to Others None reported or observed

## 2022-04-09 DIAGNOSIS — F333 Major depressive disorder, recurrent, severe with psychotic symptoms: Secondary | ICD-10-CM | POA: Diagnosis not present

## 2022-04-09 MED ORDER — HYDROXYZINE HCL 50 MG PO TABS
50.0000 mg | ORAL_TABLET | Freq: Three times a day (TID) | ORAL | Status: DC | PRN
  Administered 2022-04-09 – 2022-04-12 (×6): 50 mg via ORAL
  Filled 2022-04-09: qty 15
  Filled 2022-04-09 (×6): qty 1

## 2022-04-09 NOTE — Progress Notes (Signed)
   04/09/22 0800  Psych Admission Type (Psych Patients Only)  Admission Status Voluntary  Psychosocial Assessment  Patient Complaints Anxiety;Depression  Eye Contact Fair  Facial Expression Flat  Affect Depressed  Speech Logical/coherent  Interaction Assertive  Motor Activity Other (Comment) (WDL)  Appearance/Hygiene Unremarkable  Behavior Characteristics Appropriate to situation  Mood Depressed;Anxious  Aggressive Behavior  Effect No apparent injury  Thought Process  Coherency WDL  Content WDL  Delusions None reported or observed  Perception WDL  Hallucination None reported or observed  Judgment Impaired  Confusion None  Danger to Self  Current suicidal ideation? Passive  Self-Injurious Behavior Some self-injurious ideation observed or expressed.  No lethal plan expressed   Agreement Not to Harm Self Yes  Description of Agreement Verbal contracts for safety.  Danger to Others  Danger to Others None reported or observed

## 2022-04-09 NOTE — Progress Notes (Addendum)
   On assessment, patient presents with moderate anxiety and depression.  Patient expresses anxiety related to being discharged before she thinks she is ready.  Patient is alert and oriented and has been observed in the milieu.  Patient endorses Passive SI and contracts for safety.  Patient denies HI/AVH.  Vitals obtained.  Patient BP 99/72, HR 85.  Administered 1 bottle of Gatorade.  Patient's CIWA=1, and COW=2.  Administered PRN Hydroxyzine per MAR per pt request.  Patient is safe on the unit with Q 15 minute safety checks.

## 2022-04-09 NOTE — Progress Notes (Signed)
[facility] Progress Note  04/09/2022 4:33 PM Jasneet Christin BachM Hoopes  MRN:  [id number] Principal Problem: Severe episode of recurrent major depressive disorder, with psychotic features (HCC) Diagnosis: Principal Problem:   Severe episode of recurrent major depressive disorder, with psychotic features (HCC) Active Problems:   UTI (urinary tract infection)   Alcohol use   Cannabis use disorder   GAD (generalized anxiety disorder)   Reason For Admission:  Grace [name] is a 36 yo AAF with a history of MDD & THC abuse who [facility] taking 5 tabs of Advil with an intent to end her life in the context of multiple stressors. Pt was medically stabilized and [facility] for treatment and stabilization of her mood.   24 hr chart review: V/S for the past 24 hrs WNL. Pt has been compliant with her scheduled medications, and did not require any PRN medications overnight. Pt is attending unit group sessions, and is actively participating as per nursing staff.   Today's patient assessment note: Mood today is euthymic. Pt verbalizes feeling significantly less depressed as compared to time of admission. She denied SI, denied HI, denies AVH, denied paranoia, denied other delusional thoughts at time of admission. She reports that current medication regimen is helpful for management of her depressive symptoms and anxiety. She denies any current medication related side effects.   No TD/EPS type symptoms found on assessment, and pt denies any feelings of stiffness. AIMS: 0. Pt reports willingness to continue mental health care on an outpatient basis. Plan is to discharge pt tomorrow, and she has verbalized readiness to be discharged tomorrow. Pt states that she will be going back to live with her sister with whom she was living prior to [facility]. Eye contact is good, attention to personal hygiene and grooming is much improved than at time of admission. Thoughts  are logical, speech is clear and coherent. We are continuing all medications as listed below. Celexa was increased from 30 mg to 40 mg daily starting today for MDD.  Total Time spent with patient: 30 minutes  Past Psychiatric History: Previous Psych Diagnoses: MDD, GAD [facility] 01/05/2016, 06/30/2019 Current/prior outpatient treatment: Non at this time Prior rehab hx: None Psychotherapy WU:JWJXhx:None History of suicide attempts: 06/2019 leading to hospitalization here at that time History of homicide or aggression: Denies  Home BJ:[id number]:Lopressor 0.5 mg BID, Tapazole 10 mg BID Prior Hosp: Just for childbirth per pt Prior Surgeries/Trauma: Denies  Head trauma, LOC, concussions, seizures: Denies Allergies: Denies  LMP: 03/19/22 Contraception: Denies use  PCP: Denies having one  Past Medical History:  Medical Diagnoses: Hyperthyroidism Past Medical History:  Diagnosis Date   Cholecystitis 08/18/2011   Headache(784.0)    otc med prn   History of recurrent UTIs    recurrent UTIs   Hypertension    Hyperthyroidism    Joint pain    knees and shoulders - otc med prn   Sickle cell trait (HCC)    Sickle cell trait (HCC)    SVD (spontaneous vaginal delivery)    x 3 [facility]    Past Surgical History:  Procedure Laterality Date   CHOLECYSTECTOMY  08/19/2011   Procedure: LAPAROSCOPIC CHOLECYSTECTOMY WITH INTRAOPERATIVE CHOLANGIOGRAM;  Surgeon: Lodema PilotBrian Layton, DO;  Location: [facility] OR;  Service: General;  Laterality: N/A;   DILATION AND CURETTAGE OF UTERUS     molar preg   LAPAROSCOPIC BILATERAL SALPINGECTOMY Bilateral 05/09/2013   Procedure:  LAPAROSCOPIC BILATERAL SALPINGECTOMY;  Surgeon: Allie Bossier, MD;  Location: [facility] ORS;  Service: Gynecology;  Laterality: Bilateral;   Family History:  Family History  Problem Relation Age of Onset   Diabetes Mother    Cancer Father    Diabetes Maternal Uncle    Cancer Maternal Grandmother    Cancer Maternal Grandfather     Hypertension Maternal Grandfather    Diabetes Maternal Grandfather    Sickle cell anemia Son    Asthma Son    Heart disease Paternal Grandfather    Hearing loss Paternal Aunt    Family Psychiatric  History: Psych: Mother with MDD, maternal aunt with bipolar d/o Psych Rx: Unsure SA/HA: Denies Substance use family hx: Denies     Social History:  Social History   Substance and Sexual Activity  Alcohol Use No     Social History   Substance and Sexual Activity  Drug Use Yes   Types: Marijuana   Comment: occasional    Social History   Socioeconomic History   Marital status: Single    Spouse name: Not on file   Number of children: Not on file   Years of education: Not on file   Highest education level: Not on file  Occupational History   Not on file  Tobacco Use   Smoking status: Never   Smokeless tobacco: Never  Vaping Use   Vaping Use: Never used  Substance and Sexual Activity   Alcohol use: No   Drug use: Yes    Types: Marijuana    Comment: occasional   Sexual activity: Yes    Birth control/protection: Surgical  Other Topics Concern   Not on file  Social History Narrative   2 Adults, 4 children at home   FOB involved   Social Determinants of Health   Financial Resource Strain: Not on file  Food Insecurity: No Food Insecurity (04/01/2022)   Hunger Vital Sign    Worried About Running Out of Food in the Last Year: Never true    Ran Out of Food in the Last Year: Never true  Transportation Needs: No Transportation Needs (04/01/2022)   PRAPARE - Administrator, Civil Service (Medical): No    Lack of Transportation (Non-Medical): No  Physical Activity: Not on file  Stress: Not on file  Social Connections: Not on file   Additional Social History:    Sleep: Fair  Appetite:  Good  Current Medications: Current Facility-Administered Medications  Medication Dose Route Frequency Provider Last Rate Last Admin   acetaminophen (TYLENOL) tablet 650  mg  650 mg Oral Q6H PRN Motley-Mangrum, Jadeka A, PMHNP   650 mg at 04/04/22 1417   alum & mag hydroxide-simeth (MAALOX/MYLANTA) 200-200-20 MG/5ML suspension 30 mL  30 mL Oral Q4H PRN Motley-Mangrum, Jadeka A, PMHNP   30 mL at 04/05/22 1417   citalopram (CELEXA) tablet 40 mg  40 mg Oral Daily Massengill, Harrold Donath, MD   40 mg at 04/09/22 0804   hydrOXYzine (ATARAX) tablet 50 mg  50 mg Oral TID PRN Massengill, Harrold Donath, MD       magnesium hydroxide (MILK OF MAGNESIA) suspension 30 mL  30 mL Oral Daily PRN Motley-Mangrum, Jadeka A, PMHNP       metoprolol tartrate (LOPRESSOR) tablet 12.5 mg  12.5 mg Oral BID Motley-Mangrum, Jadeka A, PMHNP   12.5 mg at 04/09/22 0804   multivitamin with minerals tablet 1 tablet  1 tablet Oral Daily Massengill, Harrold Donath, MD   1 tablet at 04/09/22 [phone]  QUEtiapine (SEROQUEL XR) 24 hr tablet 300 mg  300 mg Oral QHS Massengill, Nathan, MD   300 mg at 04/08/22 2109   QUEtiapine (SEROQUEL XR) 24 hr tablet 50 mg  50 mg Oral BID Massengill, Harrold Donath, MD   50 mg at 04/09/22 1310   QUEtiapine (SEROQUEL) tablet 50 mg  50 mg Oral QHS PRN Massengill, Harrold Donath, MD   50 mg at 04/07/22 2254   thiamine (Vitamin B-1) tablet 100 mg  100 mg Oral Daily Massengill, Harrold Donath, MD   100 mg at 04/09/22 0034    Lab Results:  No results found for this or any previous visit (from the past 48 hour(s)).   Blood Alcohol level:  Lab Results  Component Value Date   ETH <10 03/31/2022   ETH <10 06/29/2019    Metabolic Disorder Labs: Lab Results  Component Value Date   HGBA1C 5.1 04/02/2022   MPG 100 04/02/2022   MPG 96.8 07/01/2019   Lab Results  Component Value Date   PROLACTIN 35.2 (H) 01/09/2016   Lab Results  Component Value Date   CHOL 145 04/02/2022   TRIG 126 04/02/2022   HDL 46 04/02/2022   CHOLHDL 3.2 04/02/2022   VLDL 25 04/02/2022   LDLCALC 74 04/02/2022   LDLCALC 88 07/01/2019   Physical Findings: AIMS: 0    CIWA:  CIWA-Ar Total: 0 COWS: n/a COWS Total Score:  2  Musculoskeletal: Strength & Muscle Tone: within normal limits Gait & Station: normal Patient leans: N/A  Psychiatric Specialty Exam:  Presentation  General Appearance:  Appropriate for Environment; Fairly Groomed  Eye Contact: Good  Speech: Clear and Coherent  Speech Volume: Normal  Handedness: Right   Mood and Affect  Mood: Euthymic  Affect: Appropriate; Congruent  Thought Process  Thought Processes: Coherent  Descriptions of Associations:Intact  Orientation:Full (Time, Place and Person)  Thought Content:Logical  History of Schizophrenia/Schizoaffective disorder:No data recorded Duration of Psychotic Symptoms:No data recorded Hallucinations:Hallucinations: None  Ideas of Reference:None  Suicidal Thoughts:Suicidal Thoughts: No  Homicidal Thoughts:Homicidal Thoughts: No  Sensorium  Memory: Immediate Good  Judgment: Good  Insight: Good  Executive Functions  Concentration: Good  Attention Span: Good  Recall: Good  Fund of Knowledge: Good  Language: Good  Psychomotor Activity  Psychomotor Activity: Psychomotor Activity: Normal  Assets  Assets: Communication Skills; Social Support  Sleep  Sleep: Sleep: Good  Physical Exam: Physical Exam Vitals and nursing note reviewed.  Constitutional:      Appearance: Normal appearance.  HENT:     Head: Normocephalic and atraumatic.     Nose: Nose normal.     Mouth/Throat:     Mouth: Mucous membranes are moist.     Pharynx: Oropharynx is clear.  Eyes:     Extraocular Movements: Extraocular movements intact.     Conjunctiva/sclera: Conjunctivae normal.     Pupils: Pupils are equal, round, and reactive to light.  Cardiovascular:     Rate and Rhythm: Normal rate.     Pulses: Normal pulses.  Pulmonary:     Effort: Pulmonary effort is normal.  Abdominal:     Palpations: Abdomen is soft.  Genitourinary:    Comments: Deferred Musculoskeletal:        General: Normal range of  motion.     Cervical back: Normal range of motion.  Skin:    General: Skin is warm.  Neurological:     General: No focal deficit present.     Mental Status: She is alert and oriented to person, place, and  time.  Psychiatric:        Mood and Affect: Mood normal.        Behavior: Behavior normal.    Review of Systems  Constitutional: Negative.   HENT: Negative.    Eyes: Negative.   Respiratory: Negative.    Cardiovascular: Negative.   Gastrointestinal: Negative.   Genitourinary: Negative.   Musculoskeletal: Negative.   Skin: Negative.   Endo/Heme/Allergies: Negative.   Psychiatric/Behavioral:  Positive for depression and substance abuse. Negative for hallucinations, memory loss and suicidal ideas. The patient is nervous/anxious. The patient does not have insomnia.    Blood pressure 110/61, pulse 84, temperature 98.8 F (37.1 C), temperature source Oral, resp. rate 16, height 5\' 4"  (1.626 m), weight 63.5 kg, SpO2 100 %. Body mass index is 24.03 kg/m.  Treatment Plan Summary: Daily contact with patient to assess and evaluate symptoms and progress in treatment and Medication management Observation Level/Precautions:  15 minute checks  Laboratory:  Labs reviewed   Psychotherapy:  Unit Group sessions  Medications:  See [facility]  Consultations:  To be determined   Discharge Concerns:  Safety, medication compliance, mood stability  Estimated LOS: 5-7 days  Other:  N/A    Labs Reviewed on 04/05/2022: TSH WNL at 1.583, Lipid panel WNL, HA1C WNL, CMP mostly WNL with exception of glucose of 123, CBC WNL, Urine pregnancy test negative, UA with nitrites and Leukocytes and bacteria. Pt treated for a UTI.   PLAN Safety and Monitoring: Voluntary admission to inpatient psychiatric unit for safety, stabilization and treatment Daily contact with patient to assess and evaluate symptoms and progress in treatment Patient's case to be discussed in multi-disciplinary team meeting Observation Level :  q15 minute checks Vital signs: q12 hours Precautions: Safety   Long Term Goal(s): Improvement in symptoms so as ready for discharge   Short Term Goals: Ability to identify changes in lifestyle to reduce recurrence of condition will improve, Ability to disclose and discuss suicidal ideas, Ability to demonstrate self-control will improve, Ability to identify and develop effective coping behaviors will improve, Compliance with prescribed medications will improve, and Ability to identify triggers associated with substance abuse/mental health issues will improve   Physician Treatment Plan for Secondary Diagnosis:  Principal Problem:   Severe episode of recurrent major depressive disorder, with psychotic features (HCC) Active Problems:   UTI (urinary tract infection)   Alcohol use   Cannabis use disorder   GAD (generalized anxiety disorder)   Medications (Pt educated on all medications, rationales, benefits and possible side effects, and agreeable to trials) -Continue Celexa 40 mg daily for depressive symptoms (started by admitting Provider) -Continue Seroquel XR 50 mg in the morning, Seroquel 50 mg XR every afternoon, and continue night dose of 200 mg p.o. q. nightly for sleep/mood stabilization -Continue Seroquel 50 mg PRN for insomnia -Continue Lopressor 12.5 mg daily for htn -Keflex 500 mg Q 8 H for UTI-ended 12/18 @ 1400 -Continue Hydroxyzine 25 mg every 6 hours PRN for anxiety -Continue Ativan 1mg  tabs every 6 hours PRN for CIWA > 10   Other PRNS -Continue Tylenol 650 mg every 6 hours PRN for mild pain -Continue Maalox 30 mg every 4 hrs PRN for indigestion -Continue Imodium 2-4 mg as needed for diarrhea -Continue Milk of Magnesia as needed every 6 hrs for constipation -Continue Zofran disintegrating tabs every 6 hrs PRN for nausea   Discharge Planning: Social work and case management to assist with discharge planning and identification of hospital follow-up needs prior to  discharge Estimated LOS: 5-7 days Discharge Concerns: Need to establish a safety plan; Medication compliance and effectiveness Discharge Goals: Return home with outpatient referrals for mental health follow-up including medication management/psychotherapy  Starleen Blue, NP 04/09/2022, 4:33 PMPatient ID: [name], female   DOB: June 13, 1985, 36 y.o.   MRN: [id number] Patient ID: NELSY MADONNA, female   DOB: 1986/02/28, 36 y.o.   Patient ID: ANORAH TRIAS, female   DOB: [birth year], 36 y.o.   MRN: [id number] Patient ID: LATAYNA RITCHIE, female   DOB: May 21, 1985, 36 y.o.   MRN: [id number] Patient ID: JINA OLENICK, female   DOB: 12-01-1985, 36 y.o.   MRN: [id number]

## 2022-04-09 NOTE — Progress Notes (Signed)
Pt expressed a concern to writer that she is not feeling ready for d/c tomorrow. Pt states: "Im here because I attempted on my life and I currently do not trust myself to go back home". Pt given verbal support and was encouraged to speak with her nurse about her concerns.

## 2022-04-09 NOTE — Plan of Care (Signed)
  Problem: Education: Goal: Ability to make informed decisions regarding treatment will improve Outcome: Progressing   Problem: Coping: Goal: Coping ability will improve Outcome: Progressing   Problem: Medication: Goal: Compliance with prescribed medication regimen will improve Outcome: Progressing   Problem: Self-Concept: Goal: Will verbalize positive feelings about self Outcome: Progressing   Problem: Coping: Goal: Ability to identify and develop effective coping behavior will improve Outcome: Progressing   Problem: Self-Concept: Goal: Level of anxiety will decrease Outcome: Progressing

## 2022-04-09 NOTE — Plan of Care (Signed)
  Problem: Coping: Goal: Coping ability will improve Outcome: Progressing   Problem: Education: Goal: Ability to make informed decisions regarding treatment will improve Outcome: Progressing   Problem: Medication: Goal: Compliance with prescribed medication regimen will improve Outcome: Progressing   Problem: Self-Concept: Goal: Ability to disclose and discuss suicidal ideas will improve Outcome: Progressing Goal: Will verbalize positive feelings about self Outcome: Progressing

## 2022-04-09 NOTE — [facility] Group Notes (Signed)
RN Group Note  Group Date: 04/09/2022 Start Time: 1:30pm End Time: 1:55pm   Type of Therapy and Topic:  Group Note: Positive Affirmations  Participation Level:  Active   Description of Group:   This group addressed positive affirmation towards self and others.  Patients went around the room and identified two positive things about themselves and two positive things about a peer in the room.  Patients reflected on how it felt to share something positive with others, to identify positive things about themselves, and to hear positive things from others/ Patients were encouraged to have a daily reflection of positive characteristics or circumstances.   Therapeutic Goals: Patients will verbalize two of their positive qualities Patients will demonstrate empathy for others by stating two positive qualities about a peer in the group Patients will verbalize their feelings when voicing positive self affirmations and when voicing positive affirmations of others Patients will discuss the potential positive impact on their wellness/recovery of focusing on positive traits of self and others.  Summary of Patient Progress:  Patient actively engaged in the discussion and patient was able to identify positive affirmations about self as well as other group members. Patient demonstrated insight into the subject matter, was respectful of peers, participated throughout the entire session.      Cherre Blanc, RN 04/09/2022  1:57PM

## 2022-04-10 DIAGNOSIS — F333 Major depressive disorder, recurrent, severe with psychotic symptoms: Secondary | ICD-10-CM | POA: Diagnosis not present

## 2022-04-10 NOTE — Group Note (Signed)
LCSW Group Therapy Note  04/10/2022      Type of Therapy and Topic:  Group Therapy: Gratitude   Description:   Group could not be held by social worker, but licensed RN did provide group.  A handout was given to each patient, with the following information:   Gratitude  "Acknowledging the good that you already have in your life is the foundation for all abundance." - Eckhart Tolle  " 'Enough' is a feast." - Buddhist Proverb  "Gratitude sweetens even the smallest moments."  "It is not joy that makes us grateful; It is gratitude that makes us joyful." - David Steindl-Rast    Put at least one response under each category of something for which you are grateful:  People:  Experiences:  Things:  Places:  Skills:  Other:  Add more responses as you get ideas from other people.   Therapeutic Modalities:   Activity  Yojan Paskett J Grossman-Orr, LCSW   

## 2022-04-10 NOTE — Progress Notes (Addendum)
[facility] MD Progress Note  04/10/2022 2:31 PM [name]  MRN:  [id number]  Principal Problem: Severe episode of recurrent major depressive disorder, with psychotic features ([place])  Diagnosis: Principal Problem:   Severe episode of recurrent major depressive disorder, with psychotic features ([place]) Active Problems:   UTI (urinary tract infection)   Alcohol use   Cannabis use disorder   GAD (generalized anxiety disorder)  Subjective information: [name] states, " I do not trust myself to be safe if I go home tomorrow.  I rather wait till Monday before I get discharge."  Reason For Admission:  Grace [name] is a 36 yo AAF with a history of MDD & THC abuse who presented to the [facility] after taking 5 tabs of Advil with an intent to end her life in the context of multiple stressors. Pt was medically stabilized and transferred to this [facility] voluntarily for treatment and stabilization of her mood.   24 hr chart review: V/S for the past 24 hrs WNL. Pt has been compliant with her scheduled medications, and did require hydroxyzine PRN last night for anxiety. Pt is attending unit group sessions, and is actively participating as per nursing staff, however missed progressive relaxation group therapy this morning.  Today's patient assessment note: On evaluation today, mood today is euthymic. Pt reports feeling significantly less depressed as compared to time of admission. She denied SI, denied HI, denies AVH, denied paranoia, denied other delusional thoughts. She reports that current medication regimen is helpful for management of her depressive symptoms and anxiety. She denies any current medication related side effects.  However, reports as noted in the subjective information, that she cannot contract for safety if she is discharged this weekend.  Added that she would rather be discharged on Monday 04/12/22.  Vital signs and labs reviewed without critical Values.  No TD/EPS type symptoms  found on assessment, and pt denies any feelings of stiffness. AIMS: 0. Pt reports willingness to continue mental health care on an outpatient basis.  Upon discharge, patient plans to return to her sister with whom she was living prior to admission here at [facility]. Eye contact is good, attention to personal hygiene and grooming is much improved than at time of admission. Thoughts are logical, speech is clear and coherent. We are continuing all medications as listed below.  Patient continues on Celexa 40 mg daily for MDD without adverse reaction.  Total Time spent with patient: 30 minutes  Past Psychiatric History: Previous Psych Diagnoses: MDD, GAD  Prior inpatient treatment: Here at [facility] on 03/16/2015, 01/05/2016, 06/30/2019 Current/prior outpatient treatment: Non at this time Prior rehab hx: None Psychotherapy [id number]  History of suicide attempts: 06/2019 leading to hospitalization here at that time History of homicide or aggression: Denies  Home [id number] 0.5 mg BID, Tapazole 10 mg BID Prior Hosp: Just for childbirth per pt Prior Surgeries/Trauma: Denies  Head trauma, LOC, concussions, seizures: Denies Allergies: Denies  LMP: 03/19/22 Contraception: Denies use  PCP: Denies having one  Past Medical History:  Medical Diagnoses: Hyperthyroidism Past Medical History:  Diagnosis Date   Cholecystitis 08/18/2011   Headache(784.0)    otc med prn   History of recurrent UTIs    recurrent UTIs   Hypertension    Hyperthyroidism    Joint pain    knees and shoulders - otc med prn   Sickle cell trait ([place])    Sickle cell trait ([place])    SVD (spontaneous vaginal delivery)    x 3 [place]  Past Surgical History:  Procedure Laterality Date   CHOLECYSTECTOMY  08/19/2011   Procedure: LAPAROSCOPIC CHOLECYSTECTOMY WITH INTRAOPERATIVE CHOLANGIOGRAM;  Surgeon: Madilyn Hook, DO;  Location: [place];  Service: General;  Laterality: N/A;   DILATION AND CURETTAGE OF UTERUS     molar preg   LAPAROSCOPIC  BILATERAL SALPINGECTOMY Bilateral 05/09/2013   Procedure: LAPAROSCOPIC BILATERAL SALPINGECTOMY;  Surgeon: Emily Filbert, MD;  Location: [place] [facility];  Service: Gynecology;  Laterality: Bilateral;   Family History:  Family History  Problem Relation Age of Onset   Diabetes Mother    Cancer Father    Diabetes Maternal Uncle    Cancer Maternal Grandmother    Cancer Maternal Grandfather    Hypertension Maternal Grandfather    Diabetes Maternal Grandfather    Sickle cell anemia Son    Asthma Son    Heart disease Paternal Grandfather    Hearing loss Paternal Aunt    Family Psychiatric  History: Psych: Mother with MDD, maternal aunt with bipolar d/o Psych Rx: Unsure SA/HA: Denies Substance use family hx: Denies     Social History:  Social History   Substance and Sexual Activity  Alcohol Use No     Social History   Substance and Sexual Activity  Drug Use Yes   Types: Marijuana   Comment: occasional    Social History   Socioeconomic History   Marital status: Single    Spouse name: Not on file   Number of children: Not on file   Years of education: Not on file   Highest education level: Not on file  Occupational History   Not on file  Tobacco Use   Smoking status: Never   Smokeless tobacco: Never  Vaping Use   Vaping Use: Never used  Substance and Sexual Activity   Alcohol use: No   Drug use: Yes    Types: Marijuana    Comment: occasional   Sexual activity: Yes    Birth control/protection: Surgical  Other Topics Concern   Not on file  Social History Narrative   2 Adults, 4 children at home   FOB involved   Social Determinants of Health   Financial Resource Strain: Not on file  Food Insecurity: No Food Insecurity (04/01/2022)   Hunger Vital Sign    Worried About Running Out of Food in the Last Year: Never true    Ran Out of Food in the Last Year: Never true  Transportation Needs: No Transportation Needs (04/01/2022)   PRAPARE - Radiographer, therapeutic (Medical): No    Lack of Transportation (Non-Medical): No  Physical Activity: Not on file  Stress: Not on file  Social Connections: Not on file   Additional Social History:    Sleep: Fair  Appetite:  Good  Current Medications: Current Facility-Administered Medications  Medication Dose Route Frequency Provider Last Rate Last Admin   acetaminophen (TYLENOL) tablet 650 mg  650 mg Oral Q6H PRN Motley-Mangrum, Jadeka A, PMHNP   650 mg at 04/04/22 1417   alum & mag hydroxide-simeth (MAALOX/MYLANTA) 200-200-20 MG/5ML suspension 30 mL  30 mL Oral Q4H PRN Motley-Mangrum, Jadeka A, PMHNP   30 mL at 04/05/22 1417   citalopram (CELEXA) tablet 40 mg  40 mg Oral Daily Massengill, Ovid Curd, MD   40 mg at 04/10/22 [id number]   hydrOXYzine (ATARAX) tablet 50 mg  50 mg Oral TID PRN Janine Limbo, MD   50 mg at 04/09/22 2112   magnesium hydroxide (MILK OF MAGNESIA) suspension 30 mL  30 mL Oral Daily PRN Motley-Mangrum, Jadeka A, PMHNP       metoprolol tartrate (LOPRESSOR) tablet 12.5 mg  12.5 mg Oral BID Motley-Mangrum, Jadeka A, PMHNP   12.5 mg at 04/10/22 [id number]   multivitamin with minerals tablet 1 tablet  1 tablet Oral Daily Massengill, Nathan, MD   1 tablet at 04/10/22 [id number]   QUEtiapine (SEROQUEL XR) 24 hr tablet 300 mg  300 mg Oral QHS Massengill, Ovid Curd, MD   300 mg at 04/09/22 2111   QUEtiapine (SEROQUEL XR) 24 hr tablet 50 mg  50 mg Oral BID Massengill, Ovid Curd, MD   50 mg at 04/10/22 1338   QUEtiapine (SEROQUEL) tablet 50 mg  50 mg Oral QHS PRN Massengill, Ovid Curd, MD   50 mg at 04/07/22 2254   thiamine (Vitamin B-1) tablet 100 mg  100 mg Oral Daily Massengill, Ovid Curd, MD   100 mg at 04/10/22 [id number]    Lab Results:  No results found for this or any previous visit (from the past 62 hour(s)).   Blood Alcohol level:  Lab Results  Component Value Date   ETH <10 03/31/2022   ETH <10 [id number]    Metabolic Disorder Labs: Lab Results  Component Value Date   HGBA1C 5.1 04/02/2022    MPG 100 04/02/2022   MPG 96.8 07/01/2019   Lab Results  Component Value Date   PROLACTIN 35.2 (H) 01/09/2016   Lab Results  Component Value Date   CHOL 145 04/02/2022   TRIG 126 04/02/2022   HDL 46 04/02/2022   CHOLHDL 3.2 04/02/2022   VLDL 25 04/02/2022   LDLCALC 74 04/02/2022   LDLCALC 88 07/01/2019   Physical Findings: AIMS: 0    CIWA:  CIWA-Ar Total: 1 COWS: n/a COWS Total Score: 2  Musculoskeletal: Strength & Muscle Tone: within normal limits Gait & Station: normal Patient leans: N/A  Psychiatric Specialty Exam:  Presentation  General Appearance:  Appropriate for Environment; Casual; Fairly Groomed  Eye Contact: Good  Speech: Clear and Coherent  Speech Volume: Normal  Handedness: Right   Mood and Affect  Mood: Euthymic  Affect: Appropriate; Congruent  Thought Process  Thought Processes: Coherent  Descriptions of Associations:Intact  Orientation:Full (Time, Place and Person)  Thought Content:Logical  History of Schizophrenia/Schizoaffective disorder:No data recorded Duration of Psychotic Symptoms:No data recorded Hallucinations:Hallucinations: None Description of Auditory Hallucinations: Denies Description of Visual Hallucinations: Denies  Ideas of Reference:None  Suicidal Thoughts:Suicidal Thoughts: No SI Active Intent and/or Plan: -- (Denies) SI Passive Intent and/or Plan: -- (Denies)  Homicidal Thoughts:Homicidal Thoughts: No  Sensorium  Memory: Immediate Good  Judgment: Good  Insight: Good  Executive Functions  Concentration: Good  Attention Span: Good  Recall: Good  Fund of Knowledge: Good  Language: Good  Psychomotor Activity  Psychomotor Activity: Psychomotor Activity: Normal  Assets  Assets: Communication Skills; Physical Health; Desire for Improvement  Sleep  Sleep: Sleep: Good Number of Hours of Sleep: 7  Physical Exam: Physical Exam Vitals and nursing note reviewed.  Constitutional:       Appearance: Normal appearance.  HENT:     Head: Normocephalic and atraumatic.     Nose: Nose normal.     Mouth/Throat:     Mouth: Mucous membranes are moist.     Pharynx: Oropharynx is clear.  Eyes:     Extraocular Movements: Extraocular movements intact.     Conjunctiva/sclera: Conjunctivae normal.     Pupils: Pupils are equal, round, and reactive to light.  Cardiovascular:     Rate and  Rhythm: Normal rate.     Pulses: Normal pulses.  Pulmonary:     Effort: Pulmonary effort is normal.  Abdominal:     Palpations: Abdomen is soft.  Genitourinary:    Comments: Deferred Musculoskeletal:        General: Normal range of motion.     Cervical back: Normal range of motion.  Skin:    General: Skin is warm.  Neurological:     General: No focal deficit present.     Mental Status: She is alert and oriented to person, place, and time.  Psychiatric:        Mood and Affect: Mood normal.        Behavior: Behavior normal.    Review of Systems  Constitutional: Negative.   HENT: Negative.    Eyes: Negative.   Respiratory: Negative.    Cardiovascular: Negative.   Gastrointestinal: Negative.   Genitourinary: Negative.   Musculoskeletal: Negative.   Skin: Negative.   Endo/Heme/Allergies: Negative.   Psychiatric/Behavioral:  Positive for depression and substance abuse. Negative for hallucinations, memory loss and suicidal ideas. The patient is nervous/anxious. The patient does not have insomnia.    Blood pressure 92/60, pulse 84, temperature 98.3 F (36.8 C), temperature source Oral, resp. rate 16, height 5\' 4"  (1.626 m), weight 63.5 kg, SpO2 100 %. Body mass index is 24.03 kg/m.  Treatment Plan Summary: Daily contact with patient to assess and evaluate symptoms and progress in treatment and Medication management Observation Level/Precautions:  15 minute checks  Laboratory:  Labs reviewed   Psychotherapy:  Unit Group sessions  Medications:  See [facility]  Consultations:  To be  determined   Discharge Concerns:  Safety, medication compliance, mood stability  Estimated LOS: 5-7 days  Other:  N/A    Labs Reviewed on 04/05/2022: TSH WNL at 1.583, Lipid panel WNL, HA1C WNL, CMP mostly WNL with exception of glucose of 123, CBC WNL, Urine pregnancy test negative, UA with nitrites and Leukocytes and bacteria. Pt treated for a UTI.   PLAN Safety and Monitoring: Voluntary admission to inpatient psychiatric unit for safety, stabilization and treatment Daily contact with patient to assess and evaluate symptoms and progress in treatment Patient's case to be discussed in multi-disciplinary team meeting Observation Level : q15 minute checks Vital signs: q12 hours Precautions: Safety   Long Term Goal(s): Improvement in symptoms so as ready for discharge   Short Term Goals: Ability to identify changes in lifestyle to reduce recurrence of condition will improve, Ability to disclose and discuss suicidal ideas, Ability to demonstrate self-control will improve, Ability to identify and develop effective coping behaviors will improve, Compliance with prescribed medications will improve, and Ability to identify triggers associated with substance abuse/mental health issues will improve   Physician Treatment Plan for Secondary Diagnosis:  Principal Problem:   Severe episode of recurrent major depressive disorder, with psychotic features ([place]) Active Problems:   UTI (urinary tract infection)   Alcohol use   Cannabis use disorder   GAD (generalized anxiety disorder)   Medications (Pt educated on all medications, rationales, benefits and possible side effects, and agreeable to trials) -Continue Celexa 40 mg daily for depressive symptoms (started by admitting Provider) -Continue Seroquel XR 50 mg in the morning, Seroquel 50 mg XR every afternoon, and continue night dose of 200 mg p.o. q. nightly for sleep/mood stabilization -Continue Seroquel 50 mg PRN for insomnia -Continue Lopressor  12.5 mg daily for htn -Keflex 500 mg Q 8 H for UTI-ended 12/18 @ 1400 -Continue Hydroxyzine  25 mg every 6 hours PRN for anxiety -Continue Ativan 1mg  tabs every 6 hours PRN for CIWA > 10   Other PRNS -Continue Tylenol 650 mg every 6 hours PRN for mild pain -Continue Maalox 30 mg every 4 hrs PRN for indigestion -Continue Imodium 2-4 mg as needed for diarrhea -Continue Milk of Magnesia as needed every 6 hrs for constipation -Continue Zofran disintegrating tabs every 6 hrs PRN for nausea   Discharge Planning: Social work and case management to assist with discharge planning and identification of hospital follow-up needs prior to discharge Estimated LOS: 5-7 days Discharge Concerns: Need to establish a safety plan; Medication compliance and effectiveness Discharge Goals: Return home with outpatient referrals for mental health follow-up including medication management/psychotherapy  , FNP 04/10/2022, 2:31 PMPatient ID: 04/12/2022, female   DOB: 1985/08/21, 36 y.o.   MRN: [id number] Patient ID: BRITNI DRISCOLL, female   DOB: Sep 24, 1985, 36 y.o.   Patient ID: TORY MCKISSACK, female   DOB: [birth year], 36 y.o.   MRN: [id number] Patient ID: ZYAN MIRKIN, female   DOB: [birth year], 36 y.o.   MRN: [id number] Patient ID: TYASIA PACKARD, female   DOB: 1985-04-24, 36 y.o.   MRN: 20 Patient ID: TAYLINN BRABANT, female   DOB: 09-09-1985, 36 y.o.   MRN: [id number]

## 2022-04-10 NOTE — Progress Notes (Signed)
Adult Psychoeducational Group Note  Date:  04/10/2022 Time:  10:59 PM  Group Topic/Focus:  Wrap-Up Group:   The focus of this group is to help patients review their daily goal of treatment and discuss progress on daily workbooks.  Participation Level:  Active  Participation Quality:  Appropriate  Affect:  Appropriate  Cognitive:  Appropriate  Insight: Appropriate  Engagement in Group:  Developing/Improving  Modes of Intervention:  Discussion  Additional Comments:  Pt stated her goal for today was to focus on her treatment plan. Pt stated she accomplished her goal today. Pt stated she talked with her treatment team about her care today. Pt rated her overall day a 9 out of 10. Pt stated she made no calls today. Pt stated she felt better about herself tonight. Pt stated she was able to attend all meals today. Pt stated she took all medications provided today. Pt stated her appetite was pretty good today. Pt rated her sleep last night was pretty good. Pt stated the goal tonight was to get some rest. Pt stated she had no physical pain tonight. Pt deny visual hallucinations and auditory issues tonight. Pt denies thoughts of harming herself or others. Pt stated she would alert staff if anything changed.  Grace [name] 04/10/2022, 10:59 PM

## 2022-04-10 NOTE — Progress Notes (Signed)
   04/10/22 0656  15 Minute Checks  Location Bedroom  Visual Appearance Calm  Behavior Sleeping  Sleep (Behavioral Health Patients Only)  Calculate sleep? (Click Yes once per 24 hr at 0600 safety check) Yes  Documented sleep last 24 hours 7.75

## 2022-04-10 NOTE — Progress Notes (Signed)
  During assessment, patient stated, "my anxiety is up and down." When this RN asked patient to elaborate on what she meant by "up and down" patient stated, "I'm not ready for discharge,"  "I don't feel like I'm ready yet, I don't trust myself." "I have been agitated all day." Patient rated anxiety 8 out of 10 with 10 being the worst, her overall mood was 5/10. Patient stated she has had visual hallucination while in her room before going to the group therapy this evening.  "I see small figures like shadows and they disappeared after a couple of time." Patient denies HI/AH. Support and encouragement provided to patient. PRN hydroxyzine administered as prescribed for anxiety. Scheduled medications given and tolerated well. Q 15 minutes safety checks maintained. Patient verbally contracts for safety. Patient remains safe on the unit.

## 2022-04-10 NOTE — Progress Notes (Signed)
   04/10/22 1100  Psych Admission Type (Psych Patients Only)  Admission Status Voluntary  Psychosocial Assessment  Patient Complaints Depression  Eye Contact Fair  Facial Expression Flat  Affect Depressed  Speech Logical/coherent  Interaction Assertive  Motor Activity Other (Comment) (steady gait)  Appearance/Hygiene Unremarkable  Behavior Characteristics Cooperative;Appropriate to situation  Mood Depressed;Anxious  Thought Process  Coherency WDL  Content WDL  Delusions None reported or observed  Perception WDL  Hallucination None reported or observed  Judgment Impaired  Danger to Self  Current suicidal ideation? Passive  Description of Suicide Plan no plan  Self-Injurious Behavior No self-injurious ideation or behavior indicators observed or expressed   Agreement Not to Harm Self Yes  Description of Agreement verbal contract for safety  Danger to Others  Danger to Others None reported or observed

## 2022-04-10 NOTE — Progress Notes (Signed)
   04/10/22 2007  Psych Admission Type (Psych Patients Only)  Admission Status Voluntary  Psychosocial Assessment  Patient Complaints Depression;Worrying  Eye Contact Fair  Facial Expression Flat;Sad  Affect Appropriate to circumstance;Blunted;Depressed  Speech Logical/coherent;Soft  Interaction No initiation;Isolative  Motor Activity Other (Comment) (wnl)  Appearance/Hygiene Unremarkable  Behavior Characteristics Cooperative;Appropriate to situation  Mood Pleasant;Preoccupied;Depressed  Thought Process  Coherency WDL  Content WDL  Delusions None reported or observed  Perception WDL  Hallucination None reported or observed  Judgment Impaired  Confusion None  Danger to Self  Current suicidal ideation? Passive  Self-Injurious Behavior No self-injurious ideation or behavior indicators observed or expressed   Agreement Not to Harm Self Yes  Description of Agreement verbally contracts  Danger to Others  Danger to Others None reported or observed

## 2022-04-10 NOTE — [facility] Group Notes (Signed)
Adult Psychoeducational Group Note Date:  04/10/2022 Time:  0900-1000 Group Topic/Focus: PROGRESSIVE RELAXATION. A group where deep breathing is taught and tensing and relaxation muscle groups is used. Imagery is used as well.  Pts are asked to imagine 3 pillars that hold them up when they are not able to hold themselves up and to share that with the group.   Participation Level: did not attend   : Dariana Garbett A  

## 2022-04-11 DIAGNOSIS — F333 Major depressive disorder, recurrent, severe with psychotic symptoms: Secondary | ICD-10-CM | POA: Diagnosis not present

## 2022-04-11 LAB — SARS CORONAVIRUS 2 BY RT PCR: SARS Coronavirus 2 by RT PCR: NEGATIVE

## 2022-04-11 MED ORDER — BUPROPION HCL ER (XL) 150 MG PO TB24
150.0000 mg | ORAL_TABLET | Freq: Every day | ORAL | Status: DC
  Administered 2022-04-11 – 2022-04-13 (×3): 150 mg via ORAL
  Filled 2022-04-11 (×7): qty 1

## 2022-04-11 NOTE — Progress Notes (Signed)
[name] Progress Note  04/11/2022 2:14 PM Grace Christin BachM Dula  MRN:  [id number]  Principal Problem: Severe episode of recurrent major depressive disorder, with psychotic features (HCC)  Diagnosis: Principal Problem:   Severe episode of recurrent major depressive disorder, with psychotic features (HCC) Active Problems:   UTI (urinary tract infection)   Alcohol use   Cannabis use disorder   GAD (generalized anxiety disorder)  Subjective information: Grace [name] states, " I cannot for safety if I am being discharged on Monday.  I rather wait till Tuesday before I get discharge."  Reason For Admission:  Grace [name] is a 36 yo AAF with a history of MDD & THC abuse who [facility] after taking 5 tabs of Advil with an intent to end her life in the context of multiple stressors. Pt was medically stabilized and [facility] for treatment and stabilization of her mood.   24 hr chart review: V/S for the past 24 hrs WNL. Pt has been compliant with her scheduled medications, and did require hydroxyzine PRN last night for anxiety. Pt is actively attending unit group sessions, and is actively participating as per nursing staff,   Today's patient assessment note: Patient's mood today is euthymic. Pt continues to feel less depressed as compared to time of admission.  Rates depression as 7/10 with 10 being the worst.  Father rates anxiety as 7/10 with 10 being the worst.  Wellbutrin XL 150 mg p.o. daily added to her treatment regimen for depression.  Encouraged to asked nursing staff for as needed for anxiety.  She denied SI, denied HI, denies AVH, denied paranoia, denied other delusional thoughts. She reports that current medication regimen is helpful for management of her depressive symptoms and anxiety. She denies any current medication related side effects.  However, reports as noted in the subjective information, that she cannot contract for safety if she is  discharged this weekend.  Added that she would rather be discharged on Tuesday 04/13/22.  Vital signs and labs reviewed without critical Values.  Discussed patient's concerns with Dr. Luretha MurphyBayazit and above Wellbutrin added to her treatment regimen.  Will monitor effectiveness.  No TD/EPS type symptoms found on assessment, and pt denies any feelings of stiffness. AIMS: 0. Pt reports willingness to continue mental health care on an outpatient basis.  Upon discharge, patient plans to return to her sister with whom she was living prior to [facility]. Eye contact is good, attention to personal hygiene and grooming is much improved than at time of admission. Thoughts are logical, speech is clear and coherent. We are continuing all medications as listed below.  Patient continues on Celexa 40 mg daily for MDD without adverse reaction.  Total Time spent with patient: 30 minutes  Past Psychiatric History: Previous Psych Diagnoses: MDD, GAD  [facility], 01/05/2016, 06/30/2019 Current/prior outpatient treatment: Non at this time Prior rehab hx: None Psychotherapy NW:GNFAhx:None  History of suicide attempts: 06/2019 leading to hospitalization here at that time History of homicide or aggression: Denies  Home OZ:HYQMVHQIORx:Lopressor 0.5 mg BID, Tapazole 10 mg BID Prior Hosp: Just for childbirth per pt Prior Surgeries/Trauma: Denies  Head trauma, LOC, concussions, seizures: Denies Allergies: Denies  LMP: 03/19/22 Contraception: Denies use  PCP: Denies having one  Past Medical History:  Medical Diagnoses: Hyperthyroidism Past Medical History:  Diagnosis Date   Cholecystitis 08/18/2011   Headache(784.0)    otc med prn   History  of recurrent UTIs    recurrent UTIs   Hypertension    Hyperthyroidism    Joint pain    knees and shoulders - otc med prn   Sickle cell trait (HCC)    Sickle cell trait (HCC)    SVD (spontaneous vaginal delivery)    x 3 [facility]    Past Surgical  History:  Procedure Laterality Date   CHOLECYSTECTOMY  08/19/2011   Procedure: LAPAROSCOPIC CHOLECYSTECTOMY WITH INTRAOPERATIVE CHOLANGIOGRAM;  Surgeon: Lodema Pilot, DO;  Location: [facility] OR;  Service: General;  Laterality: N/A;   DILATION AND CURETTAGE OF UTERUS     molar preg   LAPAROSCOPIC BILATERAL SALPINGECTOMY Bilateral 05/09/2013   Procedure: LAPAROSCOPIC BILATERAL SALPINGECTOMY;  Surgeon: Allie Bossier, MD;  Location: [facility] ORS;  Service: Gynecology;  Laterality: Bilateral;   Family History:  Family History  Problem Relation Age of Onset   Diabetes Mother    Cancer Father    Diabetes Maternal Uncle    Cancer Maternal Grandmother    Cancer Maternal Grandfather    Hypertension Maternal Grandfather    Diabetes Maternal Grandfather    Sickle cell anemia Son    Asthma Son    Heart disease Paternal Grandfather    Hearing loss Paternal Aunt    Family Psychiatric  History: Psych: Mother with MDD, maternal aunt with bipolar d/o Psych Rx: Unsure SA/HA: Denies Substance use family hx: Denies     Social History:  Social History   Substance and Sexual Activity  Alcohol Use No     Social History   Substance and Sexual Activity  Drug Use Yes   Types: Marijuana   Comment: occasional    Social History   Socioeconomic History   Marital status: Single    Spouse name: Not on file   Number of children: Not on file   Years of education: Not on file   Highest education level: Not on file  Occupational History   Not on file  Tobacco Use   Smoking status: Never   Smokeless tobacco: Never  Vaping Use   Vaping Use: Never used  Substance and Sexual Activity   Alcohol use: No   Drug use: Yes    Types: Marijuana    Comment: occasional   Sexual activity: Yes    Birth control/protection: Surgical  Other Topics Concern   Not on file  Social History Narrative   2 Adults, 4 children at home   FOB involved   Social Determinants of Health   Financial Resource Strain: Not on file   Food Insecurity: No Food Insecurity (04/01/2022)   Hunger Vital Sign    Worried About Running Out of Food in the Last Year: Never true    Ran Out of Food in the Last Year: Never true  Transportation Needs: No Transportation Needs (04/01/2022)   PRAPARE - Administrator, Civil Service (Medical): No    Lack of Transportation (Non-Medical): No  Physical Activity: Not on file  Stress: Not on file  Social Connections: Not on file   Additional Social History:    Sleep: Fair  Appetite:  Good  Current Medications: Current Facility-Administered Medications  Medication Dose Route Frequency Provider Last Rate Last Admin   acetaminophen (TYLENOL) tablet 650 mg  650 mg Oral Q6H PRN Motley-Mangrum, Jadeka A, PMHNP   650 mg at 04/04/22 1417   alum & mag hydroxide-simeth (MAALOX/MYLANTA) 200-200-20 MG/5ML suspension 30 mL  30 mL Oral Q4H PRN Motley-Mangrum, Ezra Sites, PMHNP   30  mL at 04/05/22 1417   citalopram (CELEXA) tablet 40 mg  40 mg Oral Daily Massengill, Harrold Donath, MD   40 mg at 04/11/22 0810   hydrOXYzine (ATARAX) tablet 50 mg  50 mg Oral TID PRN Phineas Inches, MD   50 mg at 04/10/22 2109   magnesium hydroxide (MILK OF MAGNESIA) suspension 30 mL  30 mL Oral Daily PRN Motley-Mangrum, Jadeka A, PMHNP       metoprolol tartrate (LOPRESSOR) tablet 12.5 mg  12.5 mg Oral BID Motley-Mangrum, Jadeka A, PMHNP   12.5 mg at 04/11/22 0809   multivitamin with minerals tablet 1 tablet  1 tablet Oral Daily Massengill, Nathan, MD   1 tablet at 04/11/22 0809   QUEtiapine (SEROQUEL XR) 24 hr tablet 300 mg  300 mg Oral QHS Massengill, Harrold Donath, MD   300 mg at 04/10/22 2109   QUEtiapine (SEROQUEL XR) 24 hr tablet 50 mg  50 mg Oral BID Massengill, Harrold Donath, MD   50 mg at 04/11/22 0809   QUEtiapine (SEROQUEL) tablet 50 mg  50 mg Oral QHS PRN Massengill, Harrold Donath, MD   50 mg at 04/07/22 2254   thiamine (Vitamin B-1) tablet 100 mg  100 mg Oral Daily Massengill, Harrold Donath, MD   100 mg at 04/11/22 5852    Lab  Results:  Results for orders placed or performed during the hospital encounter of 04/01/22 (from the past 48 hour(s))  SARS Coronavirus 2 by RT PCR (hospital order, performed in [facility] hospital lab) *cepheid single result test* Anterior Nasal Swab     Status: None   Collection Time: 04/11/22  6:10 AM   Specimen: Anterior Nasal Swab  Result Value Ref Range   SARS Coronavirus 2 by RT PCR NEGATIVE NEGATIVE    Comment: (NOTE) SARS-CoV-2 target nucleic acids are NOT DETECTED.  The SARS-CoV-2 RNA is generally detectable in upper and lower respiratory specimens during the acute phase of infection. The lowest concentration of SARS-CoV-2 viral copies this assay can detect is 250 copies / mL. A negative result does not preclude SARS-CoV-2 infection and should not be used as the sole basis for treatment or other patient management decisions.  A negative result may occur with improper specimen collection / handling, submission of specimen other than nasopharyngeal swab, presence of viral mutation(s) within the areas targeted by this assay, and inadequate number of viral copies (<250 copies / mL). A negative result must be combined with clinical observations, patient history, and epidemiological information.  Fact Sheet for Patients:   RoadLapTop.co.za  Fact Sheet for Healthcare Providers: http://kim-miller.com/  This test is not yet approved or  cleared by the [place] FDA and has been authorized for detection and/or diagnosis of SARS-CoV-2 by FDA under an Emergency Use Authorization (EUA).  This EUA will remain in effect (meaning this test can be used) for the duration of the COVID-19 declaration under Section 564(b)(1) of the Act, 21 U.S.C. section 360bbb-3(b)(1), unless the authorization is terminated or revoked sooner.  Performed at [facility], [address] [place], [place] [address]      Blood Alcohol  level:  Lab Results  Component Value Date   [facility] <10 03/31/2022   ETH <10 06/29/2019    Metabolic Disorder Labs: Lab Results  Component Value Date   HGBA1C 5.1 04/02/2022   MPG 100 04/02/2022   MPG 96.8 07/01/2019   Lab Results  Component Value Date   PROLACTIN 35.2 (H) 01/09/2016   Lab Results  Component Value Date   CHOL  145 04/02/2022   TRIG 126 04/02/2022   HDL 46 04/02/2022   CHOLHDL 3.2 04/02/2022   VLDL 25 04/02/2022   LDLCALC 74 04/02/2022   LDLCALC 88 07/01/2019   Physical Findings: AIMS: 0    CIWA:  CIWA-Ar Total: 0 COWS: n/a COWS Total Score: 2  Musculoskeletal: Strength & Muscle Tone: within normal limits Gait & Station: normal Patient leans: N/A  Psychiatric Specialty Exam:  Presentation  General Appearance:  Appropriate for Environment; Casual; Fairly Groomed  Eye Contact: Good  Speech: Clear and Coherent  Speech Volume: Normal  Handedness: Right   Mood and Affect  Mood: Euthymic  Affect: Appropriate; Congruent  Thought Process  Thought Processes: Coherent  Descriptions of Associations:Intact  Orientation:Full (Time, Place and Person)  Thought Content:Logical  History of Schizophrenia/Schizoaffective disorder:No data recorded Duration of Psychotic Symptoms:No data recorded Hallucinations:Hallucinations: None Description of Auditory Hallucinations: Denies Description of Visual Hallucinations: Denies  Ideas of Reference:None  Suicidal Thoughts:Suicidal Thoughts: No SI Active Intent and/or Plan: -- (Denies) SI Passive Intent and/or Plan: -- (Denies)  Homicidal Thoughts:Homicidal Thoughts: No  Sensorium  Memory: Immediate Good  Judgment: Good  Insight: Good  Executive Functions  Concentration: Good  Attention Span: Good  Recall: Good  Fund of Knowledge: Good  Language: Good  Psychomotor Activity  Psychomotor Activity: Psychomotor Activity: Normal  Assets  Assets: Communication Skills;  Desire for Improvement; Physical Health  Sleep  Sleep: Sleep: Good Number of Hours of Sleep: 8  Physical Exam: Physical Exam Vitals and nursing note reviewed.  Constitutional:      Appearance: Normal appearance.  HENT:     Head: Normocephalic and atraumatic.     Nose: Nose normal.     Mouth/Throat:     Mouth: Mucous membranes are moist.     Pharynx: Oropharynx is clear.  Eyes:     Extraocular Movements: Extraocular movements intact.     Conjunctiva/sclera: Conjunctivae normal.     Pupils: Pupils are equal, round, and reactive to light.  Cardiovascular:     Rate and Rhythm: Normal rate.     Pulses: Normal pulses.  Pulmonary:     Effort: Pulmonary effort is normal.  Abdominal:     Palpations: Abdomen is soft.  Genitourinary:    Comments: Deferred Musculoskeletal:        General: Normal range of motion.     Cervical back: Normal range of motion.  Skin:    General: Skin is warm.  Neurological:     General: No focal deficit present.     Mental Status: She is alert and oriented to person, place, and time.  Psychiatric:        Mood and Affect: Mood normal.        Behavior: Behavior normal.    Review of Systems  Constitutional: Negative.   HENT: Negative.    Eyes: Negative.   Respiratory: Negative.    Cardiovascular: Negative.   Gastrointestinal: Negative.   Genitourinary: Negative.   Musculoskeletal: Negative.   Skin: Negative.   Endo/Heme/Allergies: Negative.   Psychiatric/Behavioral:  Positive for depression and substance abuse. Negative for hallucinations, memory loss and suicidal ideas. The patient is nervous/anxious. The patient does not have insomnia.    Blood pressure 92/67, pulse 85, temperature 98.5 F (36.9 C), temperature source Oral, resp. rate 16, height 5\' 4"  (1.626 m), weight 63.5 kg, SpO2 100 %. Body mass index is 24.03 kg/m.  Treatment Plan Summary: Daily contact with patient to assess and evaluate symptoms and progress in treatment and  Medication management Observation Level/Precautions:  15 minute checks  Laboratory:  Labs reviewed   Psychotherapy:  Unit Group sessions  Medications:  See [facility]  Consultations:  To be determined   Discharge Concerns:  Safety, medication compliance, mood stability  Estimated LOS: 5-7 days  Other:  N/A    Labs Reviewed on 04/05/2022: TSH WNL at 1.583, Lipid panel WNL, HA1C WNL, CMP mostly WNL with exception of glucose of 123, CBC WNL, Urine pregnancy test negative, UA with nitrites and Leukocytes and bacteria. Pt treated for a UTI.   PLAN Safety and Monitoring: Voluntary admission to inpatient psychiatric unit for safety, stabilization and treatment Daily contact with patient to assess and evaluate symptoms and progress in treatment Patient's case to be discussed in multi-disciplinary team meeting Observation Level : q15 minute checks Vital signs: q12 hours Precautions: Safety   Long Term Goal(s): Improvement in symptoms so as ready for discharge   Short Term Goals: Ability to identify changes in lifestyle to reduce recurrence of condition will improve, Ability to disclose and discuss suicidal ideas, Ability to demonstrate self-control will improve, Ability to identify and develop effective coping behaviors will improve, Compliance with prescribed medications will improve, and Ability to identify triggers associated with substance abuse/mental health issues will improve   Physician Treatment Plan for Secondary Diagnosis:  Principal Problem:   Severe episode of recurrent major depressive disorder, with psychotic features (HCC) Active Problems:   UTI (urinary tract infection)   Alcohol use   Cannabis use disorder   GAD (generalized anxiety disorder)   Medications (Pt educated on all medications, rationales, benefits and possible side effects, and agreeable to trials) -Continue Celexa 40 mg daily for depressive symptoms (started by admitting Provider) -Continue Seroquel XR 50 mg in the  morning, Seroquel 50 mg XR every afternoon, and continue night dose of 200 mg p.o. q. nightly for sleep/mood stabilization --Initiate Wellbutrin XL 150 mg p.o. daily for depression 04/11/2022 -Continue Seroquel 50 mg PRN for insomnia -Continue Lopressor 12.5 mg daily for htn -Keflex 500 mg Q 8 H for UTI-ended 12/18 @ 1400 -Continue Hydroxyzine 25 mg every 6 hours PRN for anxiety -Continue Ativan 1mg  tabs every 6 hours PRN for CIWA > 10   Other PRNS -Continue Tylenol 650 mg every 6 hours PRN for mild pain -Continue Maalox 30 mg every 4 hrs PRN for indigestion -Continue Imodium 2-4 mg as needed for diarrhea -Continue Milk of Magnesia as needed every 6 hrs for constipation -Continue Zofran disintegrating tabs every 6 hrs PRN for nausea   Discharge Planning: Social work and case management to assist with discharge planning and identification of hospital follow-up needs prior to discharge Estimated LOS: 5-7 days Discharge Concerns: Need to establish a safety plan; Medication compliance and effectiveness Discharge Goals: Return home with outpatient referrals for mental health follow-up including medication management/psychotherapy  , FNP 04/11/2022, 2:14 PMPatient ID: 04/13/2022, female   DOB: 25-Sep-1985, 36 y.o.   MRN: [id number] Patient ID: AURORA RODY, female   DOB: [birth year], 36 y.o.   Patient ID: JAYLEANA COLBERG, female   DOB: Mar 04, 1986, 36 y.o.   MRN: [id number] Patient ID: NAMRATA DANGLER, female   DOB: Sep 20, 1985, 36 y.o.   MRN: [id number] Patient ID: KEIRAN SIAS, female   DOB: May 28, 1985, 36 y.o.   MRN: 20 Patient ID: SHARENA DIBENEDETTO, female   DOB: 05/25/1985, 36 y.o.   MRN: 20 Patient ID: ACADIA THAMMAVONG, female   DOB: 12/03/1985, 36 y.o.   MRN: [id number]

## 2022-04-11 NOTE — Progress Notes (Signed)
Adult Psychoeducational Group Note  Date:  04/11/2022 Time:  10:57 PM  Group Topic/Focus:  Wrap-Up Group:   The focus of this group is to help patients review their daily goal of treatment and discuss progress on daily workbooks.  Participation Level:  Active  Participation Quality:  Appropriate  Affect:  Appropriate  Cognitive:  Appropriate  Insight: Appropriate  Engagement in Group:  Engaged  Modes of Intervention:  Discussion, Rapport Building, and Support  Additional Comments:   Pt attended and participated in the Wrap Up group. Pt denied SI/HI/AVH. Pt rated her day 6/10. Pt did not identify a current goal. Pt shared that she feels better about her medication effectiveness with the slight changes made by the provider. Pt indicated that she may have a possible discharge date but was unsure. Pt shared that watching television, getting out her room more, and improve sleep has helped to improve her mood and coping.  Grace [name] 04/11/2022, 10:57 PM

## 2022-04-11 NOTE — Group Note (Unsigned)
Date:  04/11/2022 Time:  2:12 PM  Group Topic/Focus:  Goals Group:   The focus of this group is to help patients establish daily goals to achieve during treatment and discuss how the patient can incorporate goal setting into their daily lives to aide in recovery. Orientation:   The focus of this group is to educate the patient on the purpose and policies of crisis stabilization and provide a format to answer questions about their admission.  The group details unit policies and expectations of patients while admitted.     Participation Level:  {[facility] PARTICIPATION LEVEL:22264}  Participation Quality:  {[facility] PARTICIPATION QUALITY:22265}  Affect:  {[facility] AFFECT:22266}  Cognitive:  {[facility] COGNITIVE:22267}  Insight: {[facility] Insight2:20797}  Engagement in Group:  {[facility] ENGAGEMENT IN GROUP:22268}  Modes of Intervention:  {[facility] MODES OF INTERVENTION:22269}  Additional Comments:  ***  Myrla Malanowski Lashawn Traycen Goyer 04/11/2022, 2:12 PM  

## 2022-04-11 NOTE — Group Note (Signed)
[facility] LCSW Group Therapy Note  Date/Time:  04/11/2022   Type of Therapy and Topic:  Group could not be held today.  A handout to read on the topic of developing a healthy support system was given to each patient.  They were encouraged to discuss this with their peers.  Handout:  A handout entitled "Developing Your Support System" was provided.  This handout discusses the benefits of a social support system, how to sustain current relationships, some ideas for building a social support system, and why it is important to cultivate your social support system now.  Therapeutic Modalities:   Handout  Adda Stokes Grossman-Orr, LCSW        

## 2022-04-11 NOTE — Group Note (Unsigned)
Date:  04/11/2022 Time:  2:10 PM  Group Topic/Focus:  Goals Group:   The focus of this group is to help patients establish daily goals to achieve during treatment and discuss how the patient can incorporate goal setting into their daily lives to aide in recovery. Orientation:   The focus of this group is to educate the patient on the purpose and policies of crisis stabilization and provide a format to answer questions about their admission.  The group details unit policies and expectations of patients while admitted.     Participation Level:  {[facility] PARTICIPATION LEVEL:22264}  Participation Quality:  {[facility] PARTICIPATION QUALITY:22265}  Affect:  {[facility] AFFECT:22266}  Cognitive:  {[facility] COGNITIVE:22267}  Insight: {[facility] Insight2:20797}  Engagement in Group:  {[facility] ENGAGEMENT IN GROUP:22268}  Modes of Intervention:  {[facility] MODES OF INTERVENTION:22269}  Additional Comments:  ***  Kanya Potteiger Lashawn Jezreel Sisk 04/11/2022, 2:10 PM  

## 2022-04-11 NOTE — Group Note (Signed)
Date:  04/11/2022 Time:  5:16 PM  Group Topic/Focus:  Dimensions of Wellness:   The focus of this group is to introduce the topic of wellness and discuss the role each dimension of wellness plays in total health.    Participation Level:  Active  Participation Quality:  Appropriate  Affect:  Appropriate  Cognitive:  Appropriate  Insight: Appropriate  Engagement in Group:  Engaged  Modes of Intervention:  Exploration  Additional Comments:     Reymundo Poll 04/11/2022, 5:16 PM

## 2022-04-11 NOTE — Plan of Care (Signed)
  Problem: Education: Goal: Ability to make informed decisions regarding treatment will improve Outcome: Progressing   Problem: Coping: Goal: Coping ability will improve Outcome: Progressing   Problem: Medication: Goal: Compliance with prescribed medication regimen will improve Outcome: Progressing   

## 2022-04-11 NOTE — Progress Notes (Signed)
   04/11/22 0800  Psych Admission Type (Psych Patients Only)  Admission Status Voluntary  Psychosocial Assessment  Patient Complaints Depression;Worrying  Eye Contact Fair  Facial Expression Flat;Sad  Affect Appropriate to circumstance  Speech Logical/coherent  Interaction Isolative;Minimal  Motor Activity Other (Comment) (WDL)  Appearance/Hygiene Unremarkable  Behavior Characteristics Appropriate to situation;Cooperative  Mood Pleasant;Preoccupied  Aggressive Behavior  Effect No apparent injury  Thought Process  Coherency WDL  Content WDL  Delusions None reported or observed  Perception WDL  Hallucination None reported or observed  Judgment Impaired  Confusion None  Danger to Self  Current suicidal ideation? Passive  Self-Injurious Behavior No self-injurious ideation or behavior indicators observed or expressed   Agreement Not to Harm Self Yes  Description of Agreement verbally contracts for safety  Danger to Others  Danger to Others None reported or observed

## 2022-04-12 ENCOUNTER — Encounter (HOSPITAL_COMMUNITY): Payer: Self-pay

## 2022-04-12 DIAGNOSIS — F333 Major depressive disorder, recurrent, severe with psychotic symptoms: Secondary | ICD-10-CM | POA: Diagnosis not present

## 2022-04-12 NOTE — BH IP Treatment Plan (Signed)
Interdisciplinary Treatment and Diagnostic Plan Update  04/12/2022 Time of Session: 1114 Leanne JASALYN FRYSINGER MRN: [id number]  Principal Diagnosis: Severe episode of recurrent major depressive disorder, with psychotic features (HCC)  Secondary Diagnoses: Principal Problem:   Severe episode of recurrent major depressive disorder, with psychotic features (HCC) Active Problems:   UTI (urinary tract infection)   Alcohol use   Cannabis use disorder   GAD (generalized anxiety disorder)   Current Medications:  Current Facility-Administered Medications  Medication Dose Route Frequency Provider Last Rate Last Admin   acetaminophen (TYLENOL) tablet 650 mg  650 mg Oral Q6H PRN Motley-Mangrum, Jadeka A, PMHNP   650 mg at 04/04/22 1417   alum & mag hydroxide-simeth (MAALOX/MYLANTA) 200-200-20 MG/5ML suspension 30 mL  30 mL Oral Q4H PRN Motley-Mangrum, Jadeka A, PMHNP   30 mL at 04/05/22 1417   buPROPion (WELLBUTRIN XL) 24 hr tablet 150 mg  150 mg Oral Daily Ntuen, Tina C, FNP   150 mg at 04/12/22 1540   citalopram (CELEXA) tablet 40 mg  40 mg Oral Daily Massengill, Harrold Donath, MD   40 mg at 04/12/22 0867   hydrOXYzine (ATARAX) tablet 50 mg  50 mg Oral TID PRN Phineas Inches, MD   50 mg at 04/12/22 1256   magnesium hydroxide (MILK OF MAGNESIA) suspension 30 mL  30 mL Oral Daily PRN Motley-Mangrum, Jadeka A, PMHNP       metoprolol tartrate (LOPRESSOR) tablet 12.5 mg  12.5 mg Oral BID Motley-Mangrum, Jadeka A, PMHNP   12.5 mg at 04/12/22 0808   multivitamin with minerals tablet 1 tablet  1 tablet Oral Daily Massengill, Nathan, MD   1 tablet at 04/12/22 6195   QUEtiapine (SEROQUEL XR) 24 hr tablet 300 mg  300 mg Oral QHS Massengill, Harrold Donath, MD   300 mg at 04/11/22 2120   QUEtiapine (SEROQUEL XR) 24 hr tablet 50 mg  50 mg Oral BID Massengill, Harrold Donath, MD   50 mg at 04/12/22 0932   QUEtiapine (SEROQUEL) tablet 50 mg  50 mg Oral QHS PRN Massengill, Harrold Donath, MD   50 mg at 04/07/22 2254   thiamine (Vitamin B-1)  tablet 100 mg  100 mg Oral Daily Massengill, Harrold Donath, MD   100 mg at 04/12/22 0808   PTA Medications: Medications Prior to Admission  Medication Sig Dispense Refill Last Dose   citalopram (CELEXA) 10 MG tablet Take 1 tablet (10 mg total) by mouth daily. (Patient not taking: Reported on 04/01/2022) 30 tablet 1    hydrOXYzine (ATARAX) 25 MG tablet Take 1 tablet (25 mg total) by mouth every 8 (eight) hours as needed for anxiety. (Patient not taking: Reported on 04/01/2022) 30 tablet 1    ibuprofen (ADVIL) 200 MG tablet Take 400 mg by mouth every 6 (six) hours as needed for mild pain or headache.      metoprolol tartrate (LOPRESSOR) 25 MG tablet Take 0.5 tablets (12.5 mg total) by mouth 2 (two) times daily. (Patient not taking: Reported on 04/01/2022) 30 tablet 0    QUEtiapine (SEROQUEL) 50 MG tablet Take 1 tablet (50 mg total) by mouth at bedtime. (Patient not taking: Reported on 04/01/2022) 30 tablet 1     Patient Stressors: Financial difficulties   Loss of of father around this time    Patient Strengths: Ability for insight  Average or above average intelligence  Communication skills  Supportive family/friends   Treatment Modalities: Medication Management, Group therapy, Case management,  1 to 1 session with clinician, Psychoeducation, Recreational therapy.   Physician Treatment Plan  for Primary Diagnosis: Severe episode of recurrent major depressive disorder, with psychotic features (HCC) Long Term Goal(s): Improvement in symptoms so as ready for discharge   Short Term Goals: Ability to identify changes in lifestyle to reduce recurrence of condition will improve Ability to disclose and discuss suicidal ideas Ability to demonstrate self-control will improve Ability to identify and develop effective coping behaviors will improve Compliance with prescribed medications will improve Ability to identify triggers associated with substance abuse/mental health issues will improve  Medication  Management: Evaluate patient's response, side effects, and tolerance of medication regimen.  Therapeutic Interventions: 1 to 1 sessions, Unit Group sessions and Medication administration.  Evaluation of Outcomes: Progressing  Physician Treatment Plan for Secondary Diagnosis: Principal Problem:   Severe episode of recurrent major depressive disorder, with psychotic features (HCC) Active Problems:   UTI (urinary tract infection)   Alcohol use   Cannabis use disorder   GAD (generalized anxiety disorder)  Long Term Goal(s): Improvement in symptoms so as ready for discharge   Short Term Goals: Ability to identify changes in lifestyle to reduce recurrence of condition will improve Ability to disclose and discuss suicidal ideas Ability to demonstrate self-control will improve Ability to identify and develop effective coping behaviors will improve Compliance with prescribed medications will improve Ability to identify triggers associated with substance abuse/mental health issues will improve     Medication Management: Evaluate patient's response, side effects, and tolerance of medication regimen.  Therapeutic Interventions: 1 to 1 sessions, Unit Group sessions and Medication administration.  Evaluation of Outcomes: Progressing   RN Treatment Plan for Primary Diagnosis: Severe episode of recurrent major depressive disorder, with psychotic features (HCC) Long Term Goal(s): Knowledge of disease and therapeutic regimen to maintain health will improve  Short Term Goals: Ability to remain free from injury will improve, Ability to verbalize frustration and anger appropriately will improve, Ability to demonstrate self-control, Ability to participate in decision making will improve, Ability to verbalize feelings will improve, Ability to disclose and discuss suicidal ideas, Ability to identify and develop effective coping behaviors will improve, and Compliance with prescribed medications will  improve  Medication Management: RN will administer medications as ordered by provider, will assess and evaluate patient's response and provide education to patient for prescribed medication. RN will report any adverse and/or side effects to prescribing provider.  Therapeutic Interventions: 1 on 1 counseling sessions, Psychoeducation, Medication administration, Evaluate responses to treatment, Monitor vital signs and CBGs as ordered, Perform/monitor CIWA, COWS, AIMS and Fall Risk screenings as ordered, Perform wound care treatments as ordered.  Evaluation of Outcomes: Progressing   LCSW Treatment Plan for Primary Diagnosis: Severe episode of recurrent major depressive disorder, with psychotic features (HCC) Long Term Goal(s): Safe transition to appropriate next level of care at discharge, Engage patient in therapeutic group addressing interpersonal concerns.  Short Term Goals: Engage patient in aftercare planning with referrals and resources, Increase social support, Increase ability to appropriately verbalize feelings, Increase emotional regulation, Facilitate acceptance of mental health diagnosis and concerns, Facilitate patient progression through stages of change regarding substance use diagnoses and concerns, Identify triggers associated with mental health/substance abuse issues, and Increase skills for wellness and recovery  Therapeutic Interventions: Assess for all discharge needs, 1 to 1 time with Social worker, Explore available resources and support systems, Assess for adequacy in community support network, Educate family and significant other(s) on suicide prevention, Complete Psychosocial Assessment, Interpersonal group therapy.  Evaluation of Outcomes: Progressing   Progress in Treatment: Attending groups: Yes. Participating in  groups: Yes. Taking medication as prescribed: Yes. Toleration medication: Yes. Family/Significant other contact made: Yes, individual(s) contacted:   Philis Nettle [phone] (Sister)  Patient understands diagnosis: Yes. Discussing patient identified problems/goals with staff: Yes. Medical problems stabilized or resolved: Yes. Denies suicidal/homicidal ideation: Yes. Issues/concerns per patient self-inventory: Yes.  Other:   New problem(s) identified: None Reported  New Short Term/Long Term Goal(s):Medication Stabilization  Patient Goals:  Coping Skills  Discharge Plan or Barriers: none reported  Reason for Continuation of Hospitalization: Anxiety Delusions  Depression Hallucinations Medication stabilization Suicidal ideation Withdrawal symptoms  Estimated Length of Stay: 3-7 Days  Last 3 [place] Suicide Severity Risk Score: Flowsheet Row Ad[facility] (Current) [facility] from 03/31/2022 in [facility] from 01/31/2022 in [facility] EM[facility]ENCY [facility]PARTMENT  C-SSRS RISK CATEGORY High Risk High Risk No Risk       Last PHQ 2/9 Scores:    06/11/2021    2:09 PM  Depression screen PHQ 2/9  Decreased Interest 1  Down, Depressed, Hopeless 1  PHQ - 2 Score 2  Altered sleeping 3  Tired, decreased energy 2  Change in appetite 1  Feeling bad or failure about yourself  3  Trouble concentrating 2  Moving slowly or fidgety/restless 0  Suicidal thoughts 1  PHQ-9 Score 14  Difficult doing work/chores Very difficult  detox, medication management for mood stabilization; elimination of SI thoughts; development of comprehensive mental wellness/sobriety plan    Scribe for Treatment Team: Ane Payment, LCSW 04/12/2022 1:32 PM

## 2022-04-12 NOTE — Progress Notes (Signed)
   04/11/22 2240  Psych Admission Type (Psych Patients Only)  Admission Status Voluntary  Psychosocial Assessment  Patient Complaints Sleep disturbance;Anxiety  Eye Contact Fair  Facial Expression Flat  Affect Appropriate to circumstance  Speech Logical/coherent  Interaction Guarded  Motor Activity Fidgety  Appearance/Hygiene Unremarkable  Behavior Characteristics Cooperative  Mood Depressed;Pleasant  Thought Process  Coherency WDL  Content WDL  Delusions WDL  Perception WDL  Hallucination None reported or observed  Judgment Limited  Confusion WDL  Danger to Self  Current suicidal ideation? Denies  Danger to Others  Danger to Others None reported or observed

## 2022-04-12 NOTE — Group Note (Signed)
Recreation Therapy Group Note   Group Topic:Problem Solving  Group Date: 04/12/2022 Start Time: 0930 End Time: 1000 Facilitators: Lajada Janes-McCall, LRT,CTRS Location: [address] Dayroom   Goal Area(s) Addresses:  Patient will effectively work in a team with other group members. Patient will verbalize importance of using appropriate problem solving techniques.  Patient will identify positive change associated with effective problem solving skills.    Group Description: Christmas Trivia.  Patients were divided into two teams.  LRT conducted three rounds of trivia with the patients.  The value of each question, was written beside each line were the answer was to be written.  After each round, patients will count of their points and write them at the bottom of the sheet.  At the end of the game, patients will add the totals of all three rounds together.  The team with the most points, wins the game.     Affect/Mood: Flat   Participation Level: Minimal   Participation Quality: Independent   Behavior: Attentive    Speech/Thought Process: Attentive   Insight: Fair   Judgement: Fair    Modes of Intervention: Competitive Play   Patient Response to Interventions:  Attentive   Education Outcome:  Acknowledges education and In group clarification offered    Clinical Observations/Individualized Feedback: Pt was quiet and more observant in group.  Pt engaged in answers when prompted.  Pt was attentive to peers and appropriate during session.     Plan: Continue to engage patient in RT group sessions 2-3x/week.   Averill Winters-McCall, LRT,CTRS 04/12/2022 12:48 PM

## 2022-04-12 NOTE — [facility] Group Notes (Signed)
Patient attended goals group. She shared that her goal is "to participate more in group".

## 2022-04-12 NOTE — Progress Notes (Addendum)
   D. Pt presents with a sad affect, depressed mood- reported that holidays are very hard for her, but she was keeping herself busy to cope. Pt has been visible in the milieu, observed attending groups today.   Pt currently denies withdrawal symptoms, SI/HI and AVH  A. Labs and vitals monitored. Pt given and educated on medications. Pt given prn Vistaril for complaints of anxiety.  Pt supported emotionally and encouraged to express concerns and ask questions.   R. Pt remains safe with 15 minute checks. Will continue POC.    04/12/22 1100  Psych Admission Type (Psych Patients Only)  Admission Status Voluntary  Psychosocial Assessment  Patient Complaints None  Eye Contact Fair  Facial Expression Sad  Affect Appropriate to circumstance  Speech Logical/coherent  Interaction Guarded  Motor Activity Other (Comment) (steady gait)  Appearance/Hygiene Unremarkable  Behavior Characteristics Cooperative  Mood Anxious  Thought Process  Coherency WDL  Content WDL  Delusions None reported or observed  Perception WDL  Hallucination None reported or observed  Judgment Limited  Confusion WDL  Danger to Self  Current suicidal ideation? Denies  Danger to Others  Danger to Others None reported or observed

## 2022-04-12 NOTE — [facility] Group Notes (Signed)
Patient attended the AA group. 

## 2022-04-12 NOTE — Progress Notes (Cosign Needed Addendum)
[facility] MD Progress Note  04/12/2022 2:41 PM [name]  MRN:  [id number]  Principal Problem: Severe episode of recurrent major depressive disorder, with psychotic features ([place])  Diagnosis: Principal Problem:   Severe episode of recurrent major depressive disorder, with psychotic features ([place]) Active Problems:   UTI (urinary tract infection)   Alcohol use   Cannabis use disorder   GAD (generalized anxiety disorder)  Subjective information: [name] states, "My anxiety and depression is coming down some more.  I am trying to calm myself down in readiness for discharged.  This holidays season give me too much anxiety.  The medication for depression added yesterday would be helpful because I had taken it previously my depression."  Reason For Admission:  Grace [name] is a 36 yo AAF with a history of MDD & THC abuse who presented to the [facility] after taking 5 tabs of Advil with an intent to end her life in the context of multiple stressors. Pt was medically stabilized and transferred to this [facility] voluntarily for treatment and stabilization of her mood.   24 hr chart review: V/S for the past 24 hrs WNL. Pt has been compliant with her scheduled medications, and did require hydroxyzine PRN last night and this morning for anxiety. Pt is actively attending unit group sessions, and is actively participating as per nursing staff.  Today's patient assessment note: Patient is seen and examined in her room in [name].  Patient's mood today is euthymic. Pt continues to feel less depressed as compared to time of admission.  Rates depression as 5/10 with 10 being the worst.  Further rates anxiety as 5/10 with 10 being the worst.  Continues on Wellbutrin XL 150 mg p.o. daily added yesterday to her treatment regimen for depression.  Encouraged to asked nursing staff for as needed medication for anxiety.  She denied SI, denied HI, denies AVH, denied paranoia, denied other delusional thoughts.  She reports that current medication regimen is helpful for management of her depressive symptoms and anxiety. She denies any current medication related side effects. Vital signs and labs reviewed without critical Values.  CIWA of 2 today.  No TD/EPS type symptoms found on assessment, and pt denies any feelings of stiffness. AIMS: 0. Pt reports willingness to continue mental health care on an outpatient basis.  Upon discharge, patient plans to return to her sister with whom she was living prior to admission here at [facility]. Eye contact is good, attention to personal hygiene and grooming is much improved than at time of admission. Thoughts are logical, speech is clear and coherent. We are continuing all medications as listed below.    Total Time spent with patient: 30 minutes  Past Psychiatric History: Previous Psych Diagnoses: MDD, GAD  Prior inpatient treatment: Here at [facility] on 03/16/2015, 01/05/2016, 06/30/2019 Current/prior outpatient treatment: Non at this time Prior rehab hx: None Psychotherapy [id number]  History of suicide attempts: 06/2019 leading to hospitalization here at that time History of homicide or aggression: Denies  Home [id number] 0.5 mg BID, Tapazole 10 mg BID Prior Hosp: Just for childbirth per pt Prior Surgeries/Trauma: Denies  Head trauma, LOC, concussions, seizures: Denies Allergies: Denies  LMP: 03/19/22 Contraception: Denies use  PCP: Denies having one  Past Medical History:  Medical Diagnoses: Hyperthyroidism Past Medical History:  Diagnosis Date   Cholecystitis 08/18/2011   Headache(784.0)    otc med prn   History of recurrent UTIs    recurrent UTIs   Hypertension  Hyperthyroidism    Joint pain    knees and shoulders - otc med prn   Sickle cell trait (HCC)    Sickle cell trait ([place])    SVD (spontaneous vaginal delivery)    x 3 [place]    Past Surgical History:  Procedure Laterality Date   CHOLECYSTECTOMY  08/19/2011   Procedure: LAPAROSCOPIC  CHOLECYSTECTOMY WITH INTRAOPERATIVE CHOLANGIOGRAM;  Surgeon: Madilyn Hook, DO;  Location: [place];  Service: General;  Laterality: N/A;   DILATION AND CURETTAGE OF UTERUS     molar preg   LAPAROSCOPIC BILATERAL SALPINGECTOMY Bilateral 05/09/2013   Procedure: LAPAROSCOPIC BILATERAL SALPINGECTOMY;  Surgeon: Emily Filbert, MD;  Location: [facility];  Service: Gynecology;  Laterality: Bilateral;   Family History:  Family History  Problem Relation Age of Onset   Diabetes Mother    Cancer Father    Diabetes Maternal Uncle    Cancer Maternal Grandmother    Cancer Maternal Grandfather    Hypertension Maternal Grandfather    Diabetes Maternal Grandfather    Sickle cell anemia Son    Asthma Son    Heart disease Paternal Grandfather    Hearing loss Paternal Aunt    Family Psychiatric  History: Psych: Mother with MDD, maternal aunt with bipolar d/o Psych Rx: Unsure SA/HA: Denies Substance use family hx: Denies     Social History:  Social History   Substance and Sexual Activity  Alcohol Use No     Social History   Substance and Sexual Activity  Drug Use Yes   Types: Marijuana   Comment: occasional    Social History   Socioeconomic History   Marital status: Single    Spouse name: Not on file   Number of children: Not on file   Years of education: Not on file   Highest education level: Not on file  Occupational History   Not on file  Tobacco Use   Smoking status: Never   Smokeless tobacco: Never  Vaping Use   Vaping Use: Never used  Substance and Sexual Activity   Alcohol use: No   Drug use: Yes    Types: Marijuana    Comment: occasional   Sexual activity: Yes    Birth control/protection: Surgical  Other Topics Concern   Not on file  Social History Narrative   2 Adults, 4 children at home   FOB involved   Social Determinants of Health   Financial Resource Strain: Not on file  Food Insecurity: No Food Insecurity (04/01/2022)   Hunger Vital Sign    Worried About Running  Out of Food in the Last Year: Never true    Ran Out of Food in the Last Year: Never true  Transportation Needs: No Transportation Needs (04/01/2022)   PRAPARE - Hydrologist (Medical): No    Lack of Transportation (Non-Medical): No  Physical Activity: Not on file  Stress: Not on file  Social Connections: Not on file   Additional Social History:    Sleep: Fair  Appetite:  Good  Current Medications: Current Facility-Administered Medications  Medication Dose Route Frequency Provider Last Rate Last Admin   acetaminophen (TYLENOL) tablet 650 mg  650 mg Oral Q6H PRN Motley-Mangrum, Jadeka A, PMHNP   650 mg at 04/04/22 1417   alum & mag hydroxide-simeth (MAALOX/MYLANTA) 200-200-20 MG/5ML suspension 30 mL  30 mL Oral Q4H PRN Motley-Mangrum, Jadeka A, PMHNP   30 mL at 04/05/22 1417   buPROPion (WELLBUTRIN XL) 24 hr tablet 150 mg  150 mg Oral Daily Teyon Odette, Jesusita Oka, FNP   150 mg at 04/12/22 4098   citalopram (CELEXA) tablet 40 mg  40 mg Oral Daily Massengill, Harrold Donath, MD   40 mg at 04/12/22 1191   hydrOXYzine (ATARAX) tablet 50 mg  50 mg Oral TID PRN Phineas Inches, MD   50 mg at 04/12/22 1256   magnesium hydroxide (MILK OF MAGNESIA) suspension 30 mL  30 mL Oral Daily PRN Motley-Mangrum, Jadeka A, PMHNP       metoprolol tartrate (LOPRESSOR) tablet 12.5 mg  12.5 mg Oral BID Motley-Mangrum, Jadeka A, PMHNP   12.5 mg at 04/12/22 0808   multivitamin with minerals tablet 1 tablet  1 tablet Oral Daily Massengill, Nathan, MD   1 tablet at 04/12/22 4782   QUEtiapine (SEROQUEL XR) 24 hr tablet 300 mg  300 mg Oral QHS Massengill, Harrold Donath, MD   300 mg at 04/11/22 2120   QUEtiapine (SEROQUEL XR) 24 hr tablet 50 mg  50 mg Oral BID Massengill, Harrold Donath, MD   50 mg at 04/12/22 1420   QUEtiapine (SEROQUEL) tablet 50 mg  50 mg Oral QHS PRN Massengill, Harrold Donath, MD   50 mg at 04/07/22 2254   thiamine (Vitamin B-1) tablet 100 mg  100 mg Oral Daily Massengill, Harrold Donath, MD   100 mg at 04/12/22  [id number]    Lab Results:  Results for orders placed or performed during the hospital encounter of 04/01/22 (from the past 48 hour(s))  SARS Coronavirus 2 by RT PCR (hospital order, performed in [facility] hospital lab) *cepheid single result test* Anterior Nasal Swab     Status: None   Collection Time: 04/11/22  6:10 AM   Specimen: Anterior Nasal Swab  Result Value Ref Range   SARS Coronavirus 2 by RT PCR NEGATIVE NEGATIVE    Comment: (NOTE) SARS-CoV-2 target nucleic acids are NOT DETECTED.  The SARS-CoV-2 RNA is generally detectable in upper and lower respiratory specimens during the acute phase of infection. The lowest concentration of SARS-CoV-2 viral copies this assay can detect is 250 copies / mL. A negative result does not preclude SARS-CoV-2 infection and should not be used as the sole basis for treatment or other patient management decisions.  A negative result may occur with improper specimen collection / handling, submission of specimen other than nasopharyngeal swab, presence of viral mutation(s) within the areas targeted by this assay, and inadequate number of viral copies (<250 copies / mL). A negative result must be combined with clinical observations, patient history, and epidemiological information.  Fact Sheet for Patients:   RoadLapTop.co.za  Fact Sheet for Healthcare Providers: http://kim-miller.com/  This test is not yet approved or  cleared by the [place] FDA and has been authorized for detection and/or diagnosis of SARS-CoV-2 by FDA under an Emergency Use Authorization (EUA).  This EUA will remain in effect (meaning this test can be used) for the duration of the COVID-19 declaration under Section 564(b)(1) of the Act, 21 U.S.C. section 360bbb-3(b)(1), unless the authorization is terminated or revoked sooner.  Performed at [facility], [address] [place], [place] [address]       Blood Alcohol level:  Lab Results  Component Value Date   [facility] <10 03/31/2022   ETH <10 06/29/2019    Metabolic Disorder Labs: Lab Results  Component Value Date   HGBA1C 5.1 04/02/2022   MPG 100 04/02/2022   MPG 96.8 07/01/2019   Lab Results  Component Value Date   PROLACTIN 35.2 (H) 01/09/2016  Lab Results  Component Value Date   CHOL 145 04/02/2022   TRIG 126 04/02/2022   HDL 46 04/02/2022   CHOLHDL 3.2 04/02/2022   VLDL 25 04/02/2022   LDLCALC 74 04/02/2022   LDLCALC 88 07/01/2019   Physical Findings: AIMS: 0    CIWA:  CIWA-Ar Total: 2 COWS: n/a COWS Total Score: 2  Musculoskeletal: Strength & Muscle Tone: within normal limits Gait & Station: normal Patient leans: N/A  Psychiatric Specialty Exam:  Presentation  General Appearance:  Appropriate for Environment; Casual; Fairly Groomed  Eye Contact: Good  Speech: Clear and Coherent; Normal Rate  Speech Volume: Normal  Handedness: Right   Mood and Affect  Mood: Anxious; Depressed  Affect: Appropriate; Congruent  Thought Process  Thought Processes: Coherent  Descriptions of Associations:Intact  Orientation:Full (Time, Place and Person)  Thought Content:Logical  History of Schizophrenia/Schizoaffective disorder:No data recorded Duration of Psychotic Symptoms:No data recorded Hallucinations:Hallucinations: None Description of Auditory Hallucinations: Denies Description of Visual Hallucinations: Denies  Ideas of Reference:None  Suicidal Thoughts:Suicidal Thoughts: No SI Active Intent and/or Plan: -- (Denies) SI Passive Intent and/or Plan: -- (Denies)  Homicidal Thoughts:Homicidal Thoughts: No  Sensorium  Memory: Immediate Good; Recent Good  Judgment: Good  Insight: Good  Executive Functions  Concentration: Good  Attention Span: Good  Recall: Good  Fund of Knowledge: Good  Language: Good  Psychomotor Activity  Psychomotor Activity: Psychomotor  Activity: Normal  Assets  Assets: Communication Skills; Desire for Improvement; Physical Health  Sleep  Sleep: Sleep: Good Number of Hours of Sleep: 8  Physical Exam: Physical Exam Vitals and nursing note reviewed.  Constitutional:      Appearance: Normal appearance.  HENT:     Head: Normocephalic and atraumatic.     Nose: Nose normal.     Mouth/Throat:     Mouth: Mucous membranes are moist.     Pharynx: Oropharynx is clear.  Eyes:     Extraocular Movements: Extraocular movements intact.     Conjunctiva/sclera: Conjunctivae normal.     Pupils: Pupils are equal, round, and reactive to light.  Cardiovascular:     Rate and Rhythm: Normal rate.     Pulses: Normal pulses.  Pulmonary:     Effort: Pulmonary effort is normal.  Abdominal:     Palpations: Abdomen is soft.  Genitourinary:    Comments: Deferred Musculoskeletal:        General: Normal range of motion.     Cervical back: Normal range of motion.  Skin:    General: Skin is warm.  Neurological:     General: No focal deficit present.     Mental Status: She is alert and oriented to person, place, and time.  Psychiatric:        Mood and Affect: Mood normal.        Behavior: Behavior normal.    Review of Systems  Constitutional: Negative.   HENT: Negative.    Eyes: Negative.   Respiratory: Negative.    Cardiovascular: Negative.   Gastrointestinal: Negative.   Genitourinary: Negative.   Musculoskeletal: Negative.   Skin: Negative.   Endo/Heme/Allergies: Negative.   Psychiatric/Behavioral:  Positive for depression. Negative for hallucinations, memory loss and suicidal ideas. The patient is nervous/anxious. The patient does not have insomnia.    Blood pressure 109/75, pulse 79, temperature 98.4 F (36.9 C), temperature source Oral, resp. rate 18, height 5\' 4"  (1.626 m), weight 63.5 kg, SpO2 100 %. Body mass index is 24.03 kg/m.  Treatment Plan Summary: Daily contact with patient  to assess and evaluate  symptoms and progress in treatment and Medication management Observation Level/Precautions:  15 minute checks  Laboratory:  Labs reviewed   Psychotherapy:  Unit Group sessions  Medications:  See [facility]  Consultations:  To be determined   Discharge Concerns:  Safety, medication compliance, mood stability  Estimated LOS: 5-7 days  Other:  N/A    Labs Reviewed on 04/05/2022: TSH WNL at 1.583, Lipid panel WNL, HA1C WNL, CMP mostly WNL with exception of glucose of 123, CBC WNL, Urine pregnancy test negative, UA with nitrites and Leukocytes and bacteria. Pt treated for a UTI.   PLAN Safety and Monitoring: Voluntary admission to inpatient psychiatric unit for safety, stabilization and treatment Daily contact with patient to assess and evaluate symptoms and progress in treatment Patient's case to be discussed in multi-disciplinary team meeting Observation Level : q15 minute checks Vital signs: q12 hours Precautions: Safety   Long Term Goal(s): Improvement in symptoms so as ready for discharge   Short Term Goals: Ability to identify changes in lifestyle to reduce recurrence of condition will improve, Ability to disclose and discuss suicidal ideas, Ability to demonstrate self-control will improve, Ability to identify and develop effective coping behaviors will improve, Compliance with prescribed medications will improve, and Ability to identify triggers associated with substance abuse/mental health issues will improve   Physician Treatment Plan for Secondary Diagnosis:  Principal Problem:   Severe episode of recurrent major depressive disorder, with psychotic features ([place]) Active Problems:   UTI (urinary tract infection)   Alcohol use   Cannabis use disorder   GAD (generalized anxiety disorder)   Medications (Pt educated on all medications, rationales, benefits and possible side effects, and agreeable to trials) -Continue Celexa 40 mg daily for depressive symptoms (started by admitting  Provider) -Continue Seroquel XR 50 mg in the morning, Seroquel 50 mg XR every afternoon, and continue night dose of 200 mg p.o. q. nightly for sleep/mood stabilization --Initiate Wellbutrin XL 150 mg p.o. daily for depression 04/11/2022 -Continue Seroquel 50 mg PRN for insomnia -Continue Lopressor 12.5 mg daily for htn -Keflex 500 mg Q 8 H for UTI-ended 12/18 @ 1400 -Continue Hydroxyzine 25 mg every 6 hours PRN for anxiety -Continue Ativan 1mg  tabs every 6 hours PRN for CIWA > 10   Other PRNS -Continue Tylenol 650 mg every 6 hours PRN for mild pain -Continue Maalox 30 mg every 4 hrs PRN for indigestion -Continue Imodium 2-4 mg as needed for diarrhea -Continue Milk of Magnesia as needed every 6 hrs for constipation -Continue Zofran disintegrating tabs every 6 hrs PRN for nausea   Discharge Planning: Social work and case management to assist with discharge planning and identification of hospital follow-up needs prior to discharge Estimated LOS: 5-7 days Discharge Concerns: Need to establish a safety plan; Medication compliance and effectiveness Discharge Goals: Return home with outpatient referrals for mental health follow-up including medication management/psychotherapy  Laretta Bolster, FNP 04/12/2022, 2:41 PMPatient ID: Grace [name], female   DOB: Aug 01, 1985, 36 y.o.   MRN: [id number] Patient ID: Grace [name], female   DOB: March 03, 1986, 36 y.o.   Patient ID: Grace [name], female   DOB: [birth year], 36 y.o.   MRN: [id number] Patient ID: Grace [name], female   DOB: 16-May-1985, 36 y.o.   MRN: [id number] Patient ID: Grace [name], female   DOB: 06-21-1985, 36 y.o.   MRN: [id number] Patient ID: Grace [name], female   DOB: [birth year], 36 y.o.   MRN: [id number] Patient ID: Grace [name], female  DOB: 1985-10-30, 36 y.o.   MRN: [id number] Patient ID: Grace [name], female   DOB: 12-13-1985, 36 y.o.   MRN: [id number]

## 2022-04-13 DIAGNOSIS — F333 Major depressive disorder, recurrent, severe with psychotic symptoms: Secondary | ICD-10-CM | POA: Diagnosis not present

## 2022-04-13 MED ORDER — QUETIAPINE FUMARATE ER 300 MG PO TB24: 300.0000 mg | ORAL_TABLET | Freq: Every day | ORAL | 0 refills | Status: AC

## 2022-04-13 MED ORDER — METOPROLOL TARTRATE 25 MG PO TABS: 12.5000 mg | ORAL_TABLET | Freq: Two times a day (BID) | ORAL | 0 refills | Status: AC

## 2022-04-13 MED ORDER — BUPROPION HCL ER (XL) 150 MG PO TB24: 150.0000 mg | ORAL_TABLET | Freq: Every day | ORAL | 0 refills | Status: AC

## 2022-04-13 MED ORDER — HYDROXYZINE HCL 50 MG PO TABS: 50.0000 mg | ORAL_TABLET | Freq: Three times a day (TID) | ORAL | 0 refills | Status: AC | PRN

## 2022-04-13 MED ORDER — CITALOPRAM HYDROBROMIDE 40 MG PO TABS: 40.0000 mg | ORAL_TABLET | Freq: Every day | ORAL | 0 refills | Status: AC

## 2022-04-13 MED ORDER — QUETIAPINE FUMARATE ER 50 MG PO TB24: 50.0000 mg | ORAL_TABLET | Freq: Two times a day (BID) | ORAL | 0 refills | Status: AC

## 2022-04-13 NOTE — Progress Notes (Signed)
D/c Instructions-Education: [facility] Discharge instructions given to patient, she verbalized understanding of all D/C information given to her. Patient received prescription papers to received her medication at the pharmacy.  D/c education completed with patient including follow up instructions, medication list, d/c activities limitations if indicated, with other d/c instructions as indicated by MD - patient able to verbalize understanding, all questions fully answered. Patient denied SI/HI/AVH. Suicide prevention information given and discussed with patient and understanding of D/C information verbalized by patient. All questions asked were answered by staff, patient stated they have no questions at time of D/C. Patient received all belongings brought to the unit at discharge. Patient agreed to go to the shelter inially, but later refused to leave, stating she did not want to go to the shelter. Patient was persuaded by staff to leave before she reluctantly decided to leave. Patient was escorted to the lobby and left for the shelter in a taxi at 1330.    Melvenia Needles, RN

## 2022-04-13 NOTE — Progress Notes (Signed)
  [facility] Adult Case Management Discharge Plan :  Will you be returning to the same living situation after discharge:  Yes,  Home with Sister At discharge, do you have transportation home?: Yes,  Taxi  Do you have the ability to pay for your medications: Yes,  Medicaid   Release of information consent forms completed and in the chart;  Patient's signature needed at discharge.  Patient to Follow up at:  Follow-up Information     [facility]. Go on 04/14/2022.   Specialty: Behavioral Health Why: Please go to this provider for an assessment, to obtain therapy and medication management services; Monday through Friday, arrive no later than 7:20 am. Services are provided on a first come, first served basis. Contact information: [phone] [place] [phone]                Next level of care provider has access to [facility] Link:yes  Safety Planning and Suicide Prevention discussed: Yes,  with patient and sister      Has patient been referred to the Quitline?: N/A patient is not a smoker  Patient has been referred for addiction treatment: Pt. refused referral, patient may request SAIOP services at her follow-up appointment.   Aram Beecham, LCSWA 04/13/2022, 9:58 AM

## 2022-04-13 NOTE — Progress Notes (Signed)
Pt rates depression 0/10 and anxiety 0/10. Pt reports a fair appetite, and no physical problems. Pt denies SI/HI/AVH and verbally contracts for safety. Provided support and encouragement. Pt safe on the unit. Q 15 minute safety checks continued.   

## 2022-04-13 NOTE — Discharge Summary (Signed)
Physician Discharge Summary Note  Patient:  Grace [name] is an 36 y.o., female MRN:  [id number] DOB:  [birth year] Patient phone:  [phone] (home)  Patient address:   [address] [place] [address],  Total Time spent with patient: 45 minutes  Date of Admission:  04/01/2022 Date of Discharge: 04/13/2022  Reason for Admission:   HPI: Grace [name] is a 36 year old female with a past psychiatric history of MDD, THC abuse, suicidal ideations with several documented attempts, and x3 [facility] admissions who presented voluntarily to [facility] after taking 5 Advil in an attempt to end her life in the context of multiple stressors including holidays, financial instability, and not having custody of her 3 children (16, 10, 9). She is currently unemployed with unstable housing.      24 Chart Review: Vital signs within past 24 hours within normal limits. Patient remains compliant with medications with PRN Hydroxyzine 50 mg given for anxiety. She is noted to be visible in the milieu and contracting for safety.    Assessment: On assessment patient presents alert and oriented x4. Reports reason for admission as "suicidal thoughts and attempt to OD on sleeping meds". She reports currently living in [place] with her sister where she is unemployed. States she has x3 boys ages 74, 84, 3 who are currently in DSS custody and she has minimal contact with. Reports her substance of choices as marijuana and that she is a "heavy marijuana smoker"; last used on day of admission. She reports history of chronic passive suicidal ideations that 'come and go majority of my adult life'. Denies any active or passive suicidal thoughts x3 days. She reports attending groups and unit activities; expresses interest in outpatient therapy. Currently has Medicaid. Verbalizes some remission of symptoms with current medication regimen. Denies any dry mouth, pain, or possible medication side effects. Contracts for safety.     Principal Problem: Severe episode of recurrent major depressive disorder, with psychotic features [facility]) Discharge Diagnoses: Principal Problem:   Severe episode of recurrent major depressive disorder, with psychotic features (HCC) Active Problems:   UTI (urinary tract infection)   Alcohol use   Cannabis use disorder   GAD (generalized anxiety disorder)  Past Psychiatric History: [facility] admission: 06/2019, 03/2015, 02/2015  Past Medical History:  Past Medical History:  Diagnosis Date   Cholecystitis 08/18/2011   Headache(784.0)    otc med prn   History of recurrent UTIs    recurrent UTIs   Hypertension    Hyperthyroidism    Joint pain    knees and shoulders - otc med prn   Sickle cell trait (HCC)    Sickle cell trait (HCC)    SVD (spontaneous vaginal delivery)    x 3 [facility]    Past Surgical History:  Procedure Laterality Date   CHOLECYSTECTOMY  08/19/2011   Procedure: LAPAROSCOPIC CHOLECYSTECTOMY WITH INTRAOPERATIVE CHOLANGIOGRAM;  Surgeon: Lodema Pilot, DO;  Location: [facility] OR;  Service: General;  Laterality: N/A;   DILATION AND CURETTAGE OF UTERUS     molar preg   LAPAROSCOPIC BILATERAL SALPINGECTOMY Bilateral 05/09/2013   Procedure: LAPAROSCOPIC BILATERAL SALPINGECTOMY;  Surgeon: Allie Bossier, MD;  Location: [facility] ORS;  Service: Gynecology;  Laterality: Bilateral;   Family History:  Family History  Problem Relation Age of Onset   Diabetes Mother    Cancer Father    Diabetes Maternal Uncle    Cancer Maternal Grandmother    Cancer Maternal Grandfather    Hypertension Maternal Grandfather    Diabetes Maternal  Grandfather    Sickle cell anemia Son    Asthma Son    Heart disease Paternal Grandfather    Hearing loss Paternal Aunt    Family Psychiatric History: not noted Social History:  Social History   Substance and Sexual Activity  Alcohol Use No     Social History   Substance and Sexual Activity  Drug Use Yes   Types: Marijuana   Comment: occasional    Social History    Socioeconomic History   Marital status: Single    Spouse name: Not on file   Number of children: Not on file   Years of education: Not on file   Highest education level: Not on file  Occupational History   Not on file  Tobacco Use   Smoking status: Never   Smokeless tobacco: Never  Vaping Use   Vaping Use: Never used  Substance and Sexual Activity   Alcohol use: No   Drug use: Yes    Types: Marijuana    Comment: occasional   Sexual activity: Yes    Birth control/protection: Surgical  Other Topics Concern   Not on file  Social History Narrative   2 Adults, 4 children at home   FOB involved   Social Determinants of Health   Financial Resource Strain: Not on file  Food Insecurity: No Food Insecurity (04/01/2022)   Hunger Vital Sign    Worried About Running Out of Food in the Last Year: Never true    Ran Out of Food in the Last Year: Never true  Transportation Needs: No Transportation Needs (04/01/2022)   PRAPARE - Administrator, Civil Service (Medical): No    Lack of Transportation (Non-Medical): No  Physical Activity: Not on file  Stress: Not on file  Social Connections: Not on file    Hospital Course:   During the patient's hospitalization, patient had extensive initial psychiatric evaluation, and follow-up psychiatric evaluations every day.  Psychiatric diagnoses provided upon initial assessment: Severe episode of recurrent major depressive disorder with psychotic features  Patient's psychiatric medications were adjusted on admission: Citalopram 10 mg increased to 40 mg, Hydroxyzine 25 mg increased to 50 mg TID PRN for anxiety  During the hospitalization, other adjustments were made to the patient's psychiatric medication regimen: Start Seroquel 50 mg HS PRN for insomnia  Patient's care was discussed during the interdisciplinary team meeting every day during the hospitalization.  The patient denies having side effects to prescribed psychiatric  medication.  Gradually, patient started adjusting to milieu. The patient was evaluated each day by a clinical provider to ascertain response to treatment. Improvement was noted by the patient's report of decreasing symptoms, improved sleep and appetite, affect, medication tolerance, behavior, and participation in unit programming.  Patient was asked each day to complete a self inventory noting mood, mental status, pain, new symptoms, anxiety and concerns.    Symptoms were reported as significantly decreased or resolved completely by discharge.   On day of discharge, the patient reports that their mood is stable. The patient denied having suicidal thoughts for more than 48 hours prior to discharge.  Patient denies having homicidal thoughts.  Patient denies having auditory hallucinations.  Patient denies any visual hallucinations or other symptoms of psychosis. The patient was motivated to continue taking medication with a goal of continued improvement in mental health.   The patient reports their target psychiatric symptoms of depression and suicidal ideations responded well to the psychiatric medications, and the patient reports overall benefit  other psychiatric hospitalization. Supportive psychotherapy was provided to the patient. The patient also participated in regular group therapy while hospitalized. Coping skills, problem solving as well as relaxation therapies were also part of the unit programming.  Labs were reviewed with the patient, and abnormal results were discussed with the patient.  The patient is able to verbalize their individual safety plan to this provider.  # It is recommended to the patient to continue psychiatric medications as prescribed, after discharge from the hospital.    # It is recommended to the patient to follow up with your outpatient psychiatric provider and PCP.  # It was discussed with the patient, the impact of alcohol, drugs, tobacco have been there overall  psychiatric and medical wellbeing, and total abstinence from substance use was recommended the patient.ed.  # Prescriptions provided or sent directly to preferred pharmacy at discharge. Patient agreeable to plan. Given opportunity to ask questions. Appears to feel comfortable with discharge.    # In the event of worsening symptoms, the patient is instructed to call the crisis hotline, 911 and or go to the nearest ED for appropriate evaluation and treatment of symptoms. To follow-up with primary care provider for other medical issues, concerns and or health care needs  # Patient was discharged to [facility] with a plan to follow up as noted below.  Physical Findings: AIMS:  CIWA:  CIWA-Ar Total: 2 COWS:  COWS Total Score: 2  Musculoskeletal: Strength & Muscle Tone: within normal limits Gait & Station: normal Patient leans: N/A   Psychiatric Specialty Exam:  Presentation  General Appearance:  Appropriate for Environment; Casual  Eye Contact: Good  Speech: Clear and Coherent  Speech Volume: Normal  Handedness: Right  Mood and Affect  Mood: Euthymic  Affect: Appropriate; Congruent  Thought Process  Thought Processes: Coherent  Descriptions of Associations:Intact  Orientation:Full (Time, Place and Person)  Thought Content:Logical  History of Schizophrenia/Schizoaffective disorder:No data recorded Duration of Psychotic Symptoms:No data recorded Hallucinations:Hallucinations: None Description of Auditory Hallucinations: Denies Description of Visual Hallucinations: Denies  Ideas of Reference:None  Suicidal Thoughts:Suicidal Thoughts: No SI Active Intent and/or Plan: -- (Denies) SI Passive Intent and/or Plan: -- (Denies)  Homicidal Thoughts:Homicidal Thoughts: No  Sensorium  Memory: Immediate Good; Recent Good  Judgment: Good  Insight: Good  Executive Functions  Concentration: Good  Attention Span: Good  Recall: Good  Fund of  Knowledge: Good  Language: Good  Psychomotor Activity  Psychomotor Activity: Psychomotor Activity: Normal  Assets  Assets: Communication Skills; Desire for Improvement; Housing; Physical Health; Resilience  Sleep  Sleep: Sleep: Good Number of Hours of Sleep: 8  Physical Exam: Physical Exam Vitals and nursing note reviewed.  Constitutional:      General: She is not in acute distress.    Appearance: She is normal weight. She is not ill-appearing, toxic-appearing or diaphoretic.  HENT:     Head: Normocephalic.     Nose: Nose normal.     Mouth/Throat:     Mouth: Mucous membranes are moist.     Pharynx: Oropharynx is clear.  Eyes:     Pupils: Pupils are equal, round, and reactive to light.  Cardiovascular:     Rate and Rhythm: Normal rate.     Pulses: Normal pulses.  Pulmonary:     Effort: Pulmonary effort is normal.  Abdominal:     Palpations: Abdomen is soft.  Musculoskeletal:        General: Normal range of motion.     Cervical back: Normal range of  motion.  Skin:    General: Skin is warm and dry.  Neurological:     Mental Status: She is alert and oriented to person, place, and time. Mental status is at baseline.  Psychiatric:        Attention and Perception: Attention and perception normal. She does not perceive auditory or visual hallucinations.        Mood and Affect: Mood is depressed.        Speech: Speech normal.        Behavior: Behavior is cooperative.        Thought Content: Thought content is not paranoid or delusional. Thought content does not include homicidal or suicidal ideation. Thought content does not include homicidal or suicidal plan.        Cognition and Memory: Cognition and memory normal.        Judgment: Judgment normal.    Review of Systems  Psychiatric/Behavioral:  Positive for substance abuse. Negative for depression and suicidal ideas.   All other systems reviewed and are negative.  Blood pressure 101/74, pulse 81, temperature 98.6  F (37 C), temperature source Oral, resp. rate 18, height 5\' 4"  (1.626 m), weight 63.5 kg, SpO2 100 %. Body mass index is 24.03 kg/m.   Social History   Tobacco Use  Smoking Status Never  Smokeless Tobacco Never   Tobacco Cessation:  Prescription not provided because: patient declines   Blood Alcohol level:  Lab Results  Component Value Date   ETH <10 03/31/2022   ETH <10 06/29/2019    Metabolic Disorder Labs:  Lab Results  Component Value Date   HGBA1C 5.1 04/02/2022   MPG 100 04/02/2022   MPG 96.8 07/01/2019   Lab Results  Component Value Date   PROLACTIN 35.2 (H) 01/09/2016   Lab Results  Component Value Date   CHOL 145 04/02/2022   TRIG 126 04/02/2022   HDL 46 04/02/2022   CHOLHDL 3.2 04/02/2022   VLDL 25 04/02/2022   LDLCALC 74 04/02/2022   LDLCALC 88 07/01/2019    See Psychiatric Specialty Exam and Suicide Risk Assessment completed by Attending Physician prior to discharge.  Discharge destination:  Other:  [facility]  Is patient on multiple antipsychotic therapies at discharge:  No   Has Patient had three or more failed trials of antipsychotic monotherapy by history:  No  Recommended Plan for Multiple Antipsychotic Therapies: NA  Discharge Instructions     Diet - low sodium heart healthy   Complete by: As directed    Increase activity slowly   Complete by: As directed       Allergies as of 04/13/2022   No Known Allergies      Medication List     STOP taking these medications    citalopram 10 MG tablet Commonly known as: CELEXA Replaced by: citalopram 40 MG tablet   ibuprofen 200 MG tablet Commonly known as: ADVIL   QUEtiapine 50 MG tablet Commonly known as: SEROQUEL Replaced by: QUEtiapine 50 MG Tb24 24 hr tablet       TAKE these medications      Indication  buPROPion 150 MG 24 hr tablet Commonly known as: WELLBUTRIN XL Take 1 tablet (150 mg total) by mouth daily. Start taking on: April 14, 2022  Indication: Major  Depressive Disorder   citalopram 40 MG tablet Commonly known as: CELEXA Take 1 tablet (40 mg total) by mouth daily. Start taking on: April 14, 2022 Replaces: citalopram 10 MG tablet  Indication: Major Depressive Disorder  hydrOXYzine 50 MG tablet Commonly known as: ATARAX Take 1 tablet (50 mg total) by mouth 3 (three) times daily as needed for anxiety. What changed:  medication strength how much to take when to take this  Indication: Feeling Anxious   metoprolol tartrate 25 MG tablet Commonly known as: LOPRESSOR Take 0.5 tablets (12.5 mg total) by mouth 2 (two) times daily for 15 days.  Indication: High Blood Pressure Disorder   QUEtiapine 50 MG Tb24 24 hr tablet Commonly known as: SEROQUEL XR Take 1 tablet (50 mg total) by mouth 2 (two) times daily. Replaces: QUEtiapine 50 MG tablet  Indication: Major Depressive Disorder   QUEtiapine 300 MG 24 hr tablet Commonly known as: SEROQUEL XR Take 1 tablet (300 mg total) by mouth at bedtime.  Indication: Major Depressive Disorder        Follow-up Information     [facility]. Go on 04/15/2022.   Specialty: Behavioral Health Why: Please go to this provider for an assessment, to obtain therapy and medication management services; Monday through Friday, arrive no later than 7:20 am. Services are provided on a first come, first served basis. Contact information: [address] [place] [address] [phone]                Follow-up recommendations:  Activity: as tolerated  Diet: heart healthy  Other: -Follow-up with your outpatient psychiatric provider -instructions on appointment date, time, and address (location) are provided to you in discharge paperwork.  -Take your psychiatric medications as prescribed at discharge - instructions are provided to you in the discharge paperwork  -Follow-up with outpatient primary care doctor and other specialists -for management of  preventative medicine and chronic medical disease, including: hypertension  -Testing: Follow-up with outpatient provider for abnormal lab results: 3.71 RBC, 34.9 HCT, glucose 123  -Recommend abstinence from alcohol, tobacco, and other illicit drug use at discharge. UDS+ Amphetamines, THC  -If your psychiatric symptoms recur, worsen, or if you have side effects to your psychiatric medications, call your outpatient psychiatric provider, 911, [phone] or go to the nearest emergency department.  -If suicidal thoughts recur, call your outpatient psychiatric provider, 911, 988 or go to the nearest emergency department.  Comments:  Korena TEHILLAH CIPRIANI has been instructed to take medications as prescribed; and report adverse effects to outpatient provider.  Follow up with primary doctor for any medical issues and If symptoms recur report to nearest emergency or crisis hot line.   Signed: Loletta Parish, NP 04/13/2022, 1:10 PM

## 2022-04-13 NOTE — [facility] Group Notes (Signed)
Adult Psychoeducational Group Note  Date:  04/13/2022 Time:  10:25 AM  Group Topic/Focus:  Goals Group:   The focus of this group is to help patients establish daily goals to achieve during treatment and discuss how the patient can incorporate goal setting into their daily lives to aide in recovery.  Participation Level:  Active  Participation Quality:  Appropriate  Affect:  Appropriate  Cognitive:  Appropriate  Insight: Appropriate  Engagement in Group:  Engaged  Modes of Intervention:  Education  Additional Comments:  PT's Goal is to talk  Grace [name] 04/13/2022, 10:25 AM

## 2022-04-13 NOTE — [facility] Suicide Risk Assessment (Signed)
Suicide Risk Assessment  Discharge Assessment    [facility] Discharge Suicide Risk Assessment   Principal Problem: Severe episode of recurrent major depressive disorder, with psychotic features [facility]) Discharge Diagnoses: Principal Problem:   Severe episode of recurrent major depressive disorder, with psychotic features ([place]) Active Problems:   UTI (urinary tract infection)   Alcohol use   Cannabis use disorder   GAD (generalized anxiety disorder)   HPI: Grace [name] is a 36 year old female with a past psychiatric history of MDD, THC abuse, suicidal ideations with several documented attempts, and x3 [facility] admissions who presented voluntarily to [facility] after taking 5 Advil in an attempt to end her life in the context of multiple stressors including holidays, financial instability, and not having custody of her 3 children (16, 10, 9). She is currently unemployed with unstable housing.    24 Chart Review: Vital signs within past 24 hours within normal limits. Patient remains compliant with medications with PRN Hydroxyzine 50 mg given for anxiety. She is noted to be visible in the milieu and contracting for safety.   Assessment: On assessment patient presents alert and oriented x4. Reports reason for admission as "suicidal thoughts and attempt to OD on sleeping meds". She reports currently living in [place] with her sister where she is unemployed. States she has x3 boys ages 7, 61, 33 who are currently in [place] custody and she has minimal contact with. Reports her substance of choices as marijuana and that she is a "heavy marijuana smoker"; last used on day of admission. She reports history of chronic passive suicidal ideations that 'come and go majority of my adult life'. Denies any active or passive suicidal thoughts x3 days. She reports attending groups and unit activities; expresses interest in outpatient therapy. Currently has Medicaid. Verbalizes some remission of symptoms with current medication  regimen. Denies any dry mouth, pain, or possible medication side effects. Contracts for safety.   Total Time spent with patient: 45 minutes  Musculoskeletal: Strength & Muscle Tone: within normal limits Gait & Station: normal Patient leans: N/A  Psychiatric Specialty Exam  Presentation  General Appearance:  Appropriate for Environment; Casual  Eye Contact: Good  Speech: Clear and Coherent  Speech Volume: Normal  Handedness: Right  Mood and Affect  Mood: Euthymic  Duration of Depression Symptoms: No data recorded Affect: Appropriate; Congruent  Thought Process  Thought Processes: Coherent  Descriptions of Associations:Intact  Orientation:Full (Time, Place and Person)  Thought Content:Logical  History of Schizophrenia/Schizoaffective disorder:No data recorded Duration of Psychotic Symptoms:No data recorded Hallucinations:Hallucinations: None Description of Auditory Hallucinations: Denies Description of Visual Hallucinations: Denies  Ideas of Reference:None  Suicidal Thoughts:Suicidal Thoughts: No SI Active Intent and/or Plan: -- (Denies) SI Passive Intent and/or Plan: -- (Denies)  Homicidal Thoughts:Homicidal Thoughts: No  Sensorium  Memory: Immediate Good; Recent Good  Judgment: Good  Insight: Good   Executive Functions  Concentration: Good  Attention Span: Good  Recall: Good  Fund of Knowledge: Good  Language: Good  Psychomotor Activity  Psychomotor Activity: Psychomotor Activity: Normal  Assets  Assets: Communication Skills; Desire for Improvement; Housing; Physical Health; Resilience  Sleep  Sleep: Sleep: Good Number of Hours of Sleep: 8  Physical Exam: Physical Exam Vitals and nursing note reviewed.  Constitutional:      General: She is not in acute distress.    Appearance: She is normal weight. She is not ill-appearing or toxic-appearing.  HENT:     Head: Normocephalic.     Nose: Nose normal.  Mouth/Throat:     Mouth: Mucous membranes are moist.     Pharynx: Oropharynx is clear.  Eyes:     Pupils: Pupils are equal, round, and reactive to light.  Cardiovascular:     Rate and Rhythm: Normal rate.     Pulses: Normal pulses.  Pulmonary:     Effort: Pulmonary effort is normal.  Abdominal:     Palpations: Abdomen is soft.  Musculoskeletal:        General: Normal range of motion.     Cervical back: Normal range of motion.  Skin:    General: Skin is warm and dry.  Neurological:     Mental Status: She is alert and oriented to person, place, and time.  Psychiatric:        Attention and Perception: Attention and perception normal.        Mood and Affect: Mood and affect normal.        Speech: Speech normal.        Behavior: Behavior normal. Behavior is cooperative.        Thought Content: Thought content is not paranoid or delusional. Thought content does not include homicidal or suicidal ideation. Thought content does not include homicidal or suicidal plan.        Cognition and Memory: Cognition and memory normal.        Judgment: Judgment normal.    Review of Systems  Psychiatric/Behavioral:  Positive for depression, substance abuse and suicidal ideas.   All other systems reviewed and are negative.  Blood pressure 101/74, pulse 81, temperature 98.6 F (37 C), temperature source Oral, resp. rate 18, height 5\' 4"  (1.626 m), weight 63.5 kg, SpO2 100 %. Body mass index is 24.03 kg/m.  Mental Status Per Nursing Assessment::   On Admission:  Suicidal ideation indicated by patient  Demographic Factors:  Low socioeconomic status and Unemployed  Loss Factors: Decrease in vocational status, Loss of significant relationship, and Financial problems/change in socioeconomic status  Historical Factors: Prior suicide attempts  Risk Reduction Factors:   Living with another person, especially a relative  Continued Clinical Symptoms:  Depression:   Anhedonia Alcohol/Substance  Abuse/Dependencies Unstable or Poor Therapeutic Relationship Previous Psychiatric Diagnoses and Treatments  Cognitive Features That Contribute To Risk:  None    Suicide Risk:  Moderate:  Frequent suicidal ideation with limited intensity, and duration, some specificity in terms of plans, no associated intent, good self-control, limited dysphoria/symptomatology, some risk factors present, and identifiable protective factors, including available and accessible social support.   Follow-up Information     [facility]. Go on 04/15/2022.   Specialty: Behavioral Health Why: Please go to this provider for an assessment, to obtain therapy and medication management services; Monday through Friday, arrive no later than 7:20 am. Services are provided on a first come, first served basis. Contact information: [phone] [place] [phone]               Plan Of Care/Follow-up recommendations:  Activity: as tolerated  Diet: heart healthy  Other: -Follow-up with your outpatient psychiatric provider -instructions on appointment date, time, and address (location) are provided to you in discharge paperwork.  -Take your psychiatric medications as prescribed at discharge - instructions are provided to you in the discharge paperwork  -Follow-up with outpatient primary care doctor and other specialists -for management of preventative medicine and chronic medical disease, including: hypertension  -Testing: Follow-up with outpatient provider for abnormal lab results: UDS+ amphetamines, THC, UTI,  blood glucose 123  -Recommend abstinence from alcohol, tobacco, and other illicit drug use at discharge.   -If your psychiatric symptoms recur, worsen, or if you have side effects to your psychiatric medications, call your outpatient psychiatric provider, 911, [phone] or go to the nearest emergency department.  -If suicidal thoughts recur, call your  outpatient psychiatric provider, 911, [phone] or go to the nearest emergency department.  Inda Merlin, NP 04/13/2022, 12:30 PM

## 2022-04-13 NOTE — Group Note (Signed)
LCSW Group Therapy Note   Group Date: 04/13/2022 Start Time: 1100 End Time: 1200   Type of Therapy: Group Therapy: Boundaries  Participation Level: Active  Description of Group: This group will address the use of boundaries in their personal lives. Patients will explore why boundaries are important, the difference between healthy and unhealthy boundaries, and negative and positive outcomes of different boundaries and will look at how boundaries can be crossed.  Patients will be encouraged to identify current boundaries in their own lives and identify what kind of boundary is being set. Facilitators will guide patients in utilizing problem-solving interventions to address and correct types boundaries being used and to address when no boundary is being used. Understanding and applying boundaries will be explored and addressed for obtaining and maintaining a balanced life. Patients will be encouraged to explore ways to assertively make their boundaries and needs known to significant others in their lives, using other group members and facilitator for role play, support, and feedback.   Therapeutic Goals: 1. Patient will identify areas in their life where setting clear boundaries could be used to improve their life.  2. Patient will identify signs/triggers that a boundary is not being respected. 3. Patient will identify two ways to set boundaries in order to achieve balance in their lives: 4. Patient will demonstrate ability to communicate their needs and set boundaries through discussion and/or role plays   Summary of Progress/Problems: The Pt attended group and remained there the entire time.  The Pt accepted all worksheets and materials that were provided.  The Pt was able to identify areas where they needed to create boundaries and how these boundaries can be applied to other areas in their life as well.  The Pt was appropriate with their peers throughout the group session.   Scarlett Portlock M Cayson Kalb,  LCSWA 04/13/2022  1:20 PM    

## 2022-04-13 NOTE — Group Note (Unsigned)
Date:  04/13/2022 Time:  10:11 AM  Group Topic/Focus:  Goals Group:   The focus of this group is to help patients establish daily goals to achieve during treatment and discuss how the patient can incorporate goal setting into their daily lives to aide in recovery.     Participation Level:  {BHH PARTICIPATION LEVEL:22264}  Participation Quality:  {BHH PARTICIPATION QUALITY:22265}  Affect:  {BHH AFFECT:22266}  Cognitive:  {BHH COGNITIVE:22267}  Insight: {BHH Insight2:20797}  Engagement in Group:  {BHH ENGAGEMENT IN GROUP:22268}  Modes of Intervention:  {BHH MODES OF INTERVENTION:22269}  Additional Comments:  ***  Grace [name] K Lauri Purdum 04/13/2022, 10:11 AM  

## 2022-09-07 ENCOUNTER — Encounter (HOSPITAL_COMMUNITY): Payer: Self-pay

## 2022-09-07 ENCOUNTER — Ambulatory Visit (HOSPITAL_COMMUNITY)
Admission: EM | Admit: 2022-09-07 | Discharge: 2022-09-07 | Disposition: A | Discharge status: Home / Self Care | Payer: Medicaid Other | Attending: Emergency Medicine | Admitting: Emergency Medicine

## 2022-09-07 DIAGNOSIS — J302 Other seasonal allergic rhinitis: Secondary | ICD-10-CM

## 2022-09-07 DIAGNOSIS — R21 Rash and other nonspecific skin eruption: Secondary | ICD-10-CM

## 2022-09-07 MED ORDER — METHYLPREDNISOLONE SODIUM SUCC 125 MG IJ SOLR
80.0000 mg | Freq: Once | INTRAMUSCULAR | Status: AC
  Administered 2022-09-07: 80 mg via INTRAMUSCULAR

## 2022-09-07 MED ORDER — METHYLPREDNISOLONE SODIUM SUCC 125 MG IJ SOLR
INTRAMUSCULAR | Status: AC
  Filled 2022-09-07: qty 2

## 2022-09-07 NOTE — ED Triage Notes (Signed)
Pt c/o hx of seasonal allergies. States this year is worse than normal and having tingling to lips. C/o watery eyes, sneezing, scratchy throat, and hives x4 wks. States using OTC meds with no relief.

## 2022-09-07 NOTE — ED Provider Notes (Signed)
[facility]    CSN: [id number] Arrival date & time: 09/07/22  1306      History   Chief Complaint Chief Complaint  Patient presents with   seasonal allergies     HPI Grace [name] is a 37 y.o. female.   Patient presents to clinic for ongoing seasonal allergies.  Reports that this year her allergies have been worse than normal, has been having nasal congestion, itchy eyes, lip swelling and hives to her neck for the past 4 weeks.  Reports she does not have any hives today.  She tried Benadryl yesterday, which only improved her itching.  She was taking Claritin daily, stopped this 2 days ago.  Did try an over-the-counter nasal spray, is unsure of the name.  Reports she wakes up with her left swollen and hives around her neck and stinging.  She uses Dove sensitive skin soap, does not use lotion.  She is allergic to pollen, poison ivy and poison oak.  Denies shortness of breath or wheezing.  The history is provided by the patient and medical records.    Past Medical History:  Diagnosis Date   Cholecystitis 08/18/2011   Headache(784.0)    otc med prn   History of recurrent UTIs    recurrent UTIs   Hypertension    Hyperthyroidism    Joint pain    knees and shoulders - otc med prn   Sickle cell trait (HCC)    Sickle cell trait (HCC)    SVD (spontaneous vaginal delivery)    x 3 [facility]    Patient Active Problem List   Diagnosis Date Noted   UTI (urinary tract infection) 04/02/2022   Alcohol use 04/02/2022   Cannabis use disorder 04/02/2022   GAD (generalized anxiety disorder) 04/02/2022   Severe recurrent major depression without psychotic features (HCC) 06/30/2019   Major depressive disorder, single episode, severe without psychotic features (HCC) 01/05/2016   Severe episode of recurrent major depressive disorder, with psychotic features (HCC)    MDD (major depressive disorder), recurrent episode, severe (HCC) 04/15/2015   Hyperthyroidism 03/21/2015    Tachycardia 03/21/2015   Palpitations 03/21/2015   Chest pain 03/21/2015   MDD (major depressive disorder), recurrent episode, moderate (HCC) 03/16/2015   Consultation for female sterilization 05/09/2013   NSVD (normal spontaneous vaginal delivery) 03/30/2013   Chronic hypertension in pregnancy 03/29/2013   HTN in pregnancy, chronic 03/28/2013   Transient hypertension of pregnancy, antepartum 03/20/2013   GERD (gastroesophageal reflux disease) 01/18/2013   Hyperemesis gravidarum before end of [redacted] week gestation with electrolyte imbalance 09/23/2012   Trichomonas 09/23/2012   Supervision of high-risk pregnancy 04/19/2011   History of molar pregnancy, antepartum 03/22/2011   Hyperthyroidism, antepartum 03/16/2011   Sickle cell trait (HCC) 12/30/2010    Past Surgical History:  Procedure Laterality Date   CHOLECYSTECTOMY  08/19/2011   Procedure: LAPAROSCOPIC CHOLECYSTECTOMY WITH INTRAOPERATIVE CHOLANGIOGRAM;  Surgeon: Lodema Pilot, DO;  Location: [facility];  Service: General;  Laterality: N/A;   DILATION AND CURETTAGE OF UTERUS     molar preg   LAPAROSCOPIC BILATERAL SALPINGECTOMY Bilateral 05/09/2013   Procedure: LAPAROSCOPIC BILATERAL SALPINGECTOMY;  Surgeon: Allie Bossier, MD;  Location: [facility];  Service: Gynecology;  Laterality: Bilateral;    OB History     Gravida  4   Para  3   Term  3   Preterm      AB  1   Living  3      SAB  1  IAB      Ectopic      Multiple      Live Births  3            Home Medications    Prior to Admission medications   Medication Sig Start Date End Date Taking? Authorizing Provider  buPROPion (WELLBUTRIN XL) 150 MG 24 hr tablet Take 1 tablet (150 mg total) by mouth daily. 04/14/22 05/14/22  Leevy-Johnson, Lina Sar, NP  citalopram (CELEXA) 40 MG tablet Take 1 tablet (40 mg total) by mouth daily. 04/14/22   Leevy-Johnson, Lina Sar, NP  hydrOXYzine (ATARAX) 50 MG tablet Take 1 tablet (50 mg total) by mouth 3 (three) times daily as needed  for anxiety. 04/13/22   Leevy-Johnson, Lina Sar, NP  metoprolol tartrate (LOPRESSOR) 25 MG tablet Take 0.5 tablets (12.5 mg total) by mouth 2 (two) times daily for 15 days. 04/13/22 04/28/22  Leevy-Johnson, Lina Sar, NP  QUEtiapine (SEROQUEL XR) 300 MG 24 hr tablet Take 1 tablet (300 mg total) by mouth at bedtime. 04/13/22 05/13/22  Leevy-Johnson, Lina Sar, NP  QUEtiapine (SEROQUEL XR) 50 MG TB24 24 hr tablet Take 1 tablet (50 mg total) by mouth 2 (two) times daily. 04/13/22   Leevy-Johnson, Lina Sar, NP  methimazole (TAPAZOLE) 10 MG tablet Take 2 tablets (20 mg total) by mouth 2 (two) times daily. 07/09/19 09/05/20  Aldean Baker, NP    Family History Family History  Problem Relation Age of Onset   Diabetes Mother    Cancer Father    Diabetes Maternal Uncle    Cancer Maternal Grandmother    Cancer Maternal Grandfather    Hypertension Maternal Grandfather    Diabetes Maternal Grandfather    Sickle cell anemia Son    Asthma Son    Heart disease Paternal Grandfather    Hearing loss Paternal Aunt     Social History Social History   Tobacco Use   Smoking status: Never   Smokeless tobacco: Never  Vaping Use   Vaping Use: Never used  Substance Use Topics   Alcohol use: No   Drug use: Yes    Types: Marijuana    Comment: occasional     Allergies   Patient has no known allergies.   Review of Systems Review of Systems  HENT:  Positive for congestion and rhinorrhea.   Eyes:  Positive for itching.  Respiratory:  Negative for shortness of breath and wheezing.   Skin:  Negative for rash.     Physical Exam Triage Vital Signs ED Triage Vitals  Enc Vitals Group     BP 09/07/22 1325 (!) 160/90     Pulse Rate 09/07/22 1325 100     Resp 09/07/22 1325 18     Temp 09/07/22 1325 98.3 F (36.8 C)     Temp Source 09/07/22 1325 Oral     SpO2 09/07/22 1325 99 %     Weight --      Height --      Head Circumference --      Peak Flow --      Pain Score 09/07/22 1326 0     Pain Loc  --      Pain Edu? --      Excl. in GC? --    No data found.  Updated Vital Signs BP (!) 160/90 (BP Location: Right Arm)   Pulse 100   Temp 98.3 F (36.8 C) (Oral)   Resp 18   LMP 08/27/2022   SpO2 99%  Visual Acuity Right Eye Distance:   Left Eye Distance:   Bilateral Distance:    Right Eye Near:   Left Eye Near:    Bilateral Near:     Physical Exam Vitals and nursing note reviewed.  Constitutional:      Appearance: Normal appearance.  HENT:     Head: Normocephalic and atraumatic.     Right Ear: External ear normal.     Left Ear: External ear normal.     Nose: Congestion present.     Mouth/Throat:     Mouth: Mucous membranes are moist.  Eyes:     Conjunctiva/sclera: Conjunctivae normal.  Cardiovascular:     Rate and Rhythm: Normal rate.  Pulmonary:     Effort: Pulmonary effort is normal. No respiratory distress.  Musculoskeletal:        General: No swelling. Normal range of motion.  Skin:    General: Skin is warm and dry.  Neurological:     General: No focal deficit present.     Mental Status: She is alert and oriented to person, place, and time.  Psychiatric:        Mood and Affect: Mood normal.        Behavior: Behavior normal. Behavior is cooperative.      UC Treatments / Results  Labs (all labs ordered are listed, but only abnormal results are displayed) Labs Reviewed - No data to display  EKG   Radiology No results found.  Procedures Procedures (including critical care time)  Medications Ordered in UC Medications  methylPREDNISolone sodium succinate (SOLU-MEDROL) 125 mg/2 mL injection 80 mg (80 mg Intramuscular Given 09/07/22 1342)    Initial Impression / Assessment and Plan / UC Course  I have reviewed the triage vital signs and the nursing notes.  Pertinent labs & imaging results that were available during my care of the patient were reviewed by me and considered in my medical decision making (see chart for details).  Vitals and  triage reviewed, patient is hemodynamically stable. Reports ongoing seasonal allergies. Endorses lip swelling, tingling and hives, no obvious swelling or hives on exam. Given IM Solumedrol in clinic for subjective swelling. Advised to continue on Claritin and Flonase.  Encouraged to follow-up with PCP if symptoms persist.  Strict return precautions given, no questions at this time.     Final Clinical Impressions(s) / UC Diagnoses   Final diagnoses:  Rash and nonspecific skin eruption  Seasonal allergies     Discharge Instructions      We have given you a steroid injection to help with your hives and itching today.  Please take the daily antihistamine Claritin.  You can continue with the Flonase for your nasal congestion.  If your itching and hives persist, suggest you follow-up with your primary care, they can potentially refer you out to an allergist if indicated.  Please continue using your Dove sensitive skin unscented soap, you can use unscented lotions like Lubriderm.  Please return to clinic if you develop shortness of breath, wheezing, worsening of oral swelling, or worsening of hives.     ED Prescriptions   None    PDMP not reviewed this encounter.   Shunna Mikaelian, [place], [place] 09/07/22 1344

## 2022-09-07 NOTE — Discharge Instructions (Addendum)
We have given you a steroid injection to help with your hives and itching today.  Please take the daily antihistamine Claritin.  You can continue with the Flonase for your nasal congestion.  If your itching and hives persist, suggest you follow-up with your primary care, they can potentially refer you out to an allergist if indicated.  Please continue using your Dove sensitive skin unscented soap, you can use unscented lotions like Lubriderm.  Please return to clinic if you develop shortness of breath, wheezing, worsening of oral swelling, or worsening of hives.

## 2022-11-16 ENCOUNTER — Encounter (HOSPITAL_COMMUNITY): Payer: Self-pay | Admitting: Emergency Medicine

## 2022-11-16 ENCOUNTER — Ambulatory Visit (HOSPITAL_COMMUNITY)
Admission: EM | Admit: 2022-11-16 | Discharge: 2022-11-16 | Disposition: A | Discharge status: Home / Self Care | Payer: 59 | Attending: Emergency Medicine | Admitting: Emergency Medicine

## 2022-11-16 DIAGNOSIS — L209 Atopic dermatitis, unspecified: Secondary | ICD-10-CM

## 2022-11-16 MED ORDER — TRIAMCINOLONE ACETONIDE 0.1 % EX CREA: 1.0000 | TOPICAL_CREAM | Freq: Two times a day (BID) | CUTANEOUS | 1 refills | Status: AC

## 2022-11-16 MED ORDER — METHYLPREDNISOLONE ACETATE 80 MG/ML IJ SUSP
60.0000 mg | Freq: Once | INTRAMUSCULAR | Status: AC
  Administered 2022-11-16: 60 mg via INTRAMUSCULAR

## 2022-11-16 MED ORDER — METHYLPREDNISOLONE ACETATE 80 MG/ML IJ SUSP
INTRAMUSCULAR | Status: AC
  Filled 2022-11-16: qty 1

## 2022-11-16 NOTE — ED Provider Notes (Signed)
[facility]    CSN: [id number] Arrival date & time: 11/16/22  1536      History   Chief Complaint Chief Complaint  Patient presents with   Pruritis    HPI Grace [name] is a 37 y.o. female.   Patient presents for evaluation of intermittent rash and persistent severe pruritus present to the posterior neck since the spring 2024.  Endorses known environmental allergies to pollen but rash has not occurred in years prior.  Has been scratching which typically causes whelps to the back of the neck.  Has been keeping the area clean and and applying Vaseline and castor oil as needed.  Was evaluated in this urgent care once before and given a steroid injection, did see improvement for a few weeks before symptoms flared in severity.  Has attempted to be seen by allergist but does not currently have appointment.  Denies drainage or fevers.  Denies changes in toiletries, diet or recent travel.  No other member of household or close contacts has similar symptoms.     Past Medical History:  Diagnosis Date   Cholecystitis 08/18/2011   Headache(784.0)    otc med prn   History of recurrent UTIs    recurrent UTIs   Hypertension    Hyperthyroidism    Joint pain    knees and shoulders - otc med prn   Sickle cell trait (HCC)    Sickle cell trait (HCC)    SVD (spontaneous vaginal delivery)    x 3 WH    Patient Active Problem List   Diagnosis Date Noted   UTI (urinary tract infection) 04/02/2022   Alcohol use 04/02/2022   Cannabis use disorder 04/02/2022   GAD (generalized anxiety disorder) 04/02/2022   Severe recurrent major depression without psychotic features (HCC) 06/30/2019   Major depressive disorder, single episode, severe without psychotic features (HCC) 01/05/2016   Severe episode of recurrent major depressive disorder, with psychotic features (HCC)    MDD (major depressive disorder), recurrent episode, severe (HCC) 04/15/2015   Hyperthyroidism 03/21/2015    Tachycardia 03/21/2015   Palpitations 03/21/2015   Chest pain 03/21/2015   MDD (major depressive disorder), recurrent episode, moderate (HCC) 03/16/2015   Consultation for female sterilization 05/09/2013   NSVD (normal spontaneous vaginal delivery) 03/30/2013   Chronic hypertension in pregnancy 03/29/2013   HTN in pregnancy, chronic 03/28/2013   Transient hypertension of pregnancy, antepartum 03/20/2013   GERD (gastroesophageal reflux disease) 01/18/2013   Hyperemesis gravidarum before end of [redacted] week gestation with electrolyte imbalance 09/23/2012   Trichomonas 09/23/2012   Supervision of high-risk pregnancy 04/19/2011   History of molar pregnancy, antepartum 03/22/2011   Hyperthyroidism, antepartum 03/16/2011   Sickle cell trait (HCC) 12/30/2010    Past Surgical History:  Procedure Laterality Date   CHOLECYSTECTOMY  08/19/2011   Procedure: LAPAROSCOPIC CHOLECYSTECTOMY WITH INTRAOPERATIVE CHOLANGIOGRAM;  Surgeon: Lodema Pilot, DO;  Location: [facility];  Service: General;  Laterality: N/A;   DILATION AND CURETTAGE OF UTERUS     molar preg   LAPAROSCOPIC BILATERAL SALPINGECTOMY Bilateral 05/09/2013   Procedure: LAPAROSCOPIC BILATERAL SALPINGECTOMY;  Surgeon: Allie Bossier, MD;  Location: [facility];  Service: Gynecology;  Laterality: Bilateral;    OB History     Gravida  4   Para  3   Term  3   Preterm      AB  1   Living  3      SAB  1   IAB      Ectopic  Multiple      Live Births  3            Home Medications    Prior to Admission medications   Medication Sig Start Date End Date Taking? Authorizing Provider  buPROPion (WELLBUTRIN XL) 150 MG 24 hr tablet Take 1 tablet (150 mg total) by mouth daily. 04/14/22 05/14/22  Leevy-Johnson, Lina Sar, NP  citalopram (CELEXA) 40 MG tablet Take 1 tablet (40 mg total) by mouth daily. 04/14/22   Leevy-Johnson, Lina Sar, NP  hydrOXYzine (ATARAX) 50 MG tablet Take 1 tablet (50 mg total) by mouth 3 (three) times daily as needed  for anxiety. 04/13/22   Leevy-Johnson, Lina Sar, NP  metoprolol tartrate (LOPRESSOR) 25 MG tablet Take 0.5 tablets (12.5 mg total) by mouth 2 (two) times daily for 15 days. 04/13/22 04/28/22  Leevy-Johnson, Lina Sar, NP  QUEtiapine (SEROQUEL XR) 300 MG 24 hr tablet Take 1 tablet (300 mg total) by mouth at bedtime. 04/13/22 05/13/22  Leevy-Johnson, Lina Sar, NP  QUEtiapine (SEROQUEL XR) 50 MG TB24 24 hr tablet Take 1 tablet (50 mg total) by mouth 2 (two) times daily. 04/13/22   Leevy-Johnson, Lina Sar, NP  methimazole (TAPAZOLE) 10 MG tablet Take 2 tablets (20 mg total) by mouth 2 (two) times daily. 07/09/19 09/05/20  Aldean Baker, NP    Family History Family History  Problem Relation Age of Onset   Diabetes Mother    Cancer Father    Diabetes Maternal Uncle    Cancer Maternal Grandmother    Cancer Maternal Grandfather    Hypertension Maternal Grandfather    Diabetes Maternal Grandfather    Sickle cell anemia Son    Asthma Son    Heart disease Paternal Grandfather    Hearing loss Paternal Aunt     Social History Social History   Tobacco Use   Smoking status: Never   Smokeless tobacco: Never  Vaping Use   Vaping status: Never Used  Substance Use Topics   Alcohol use: No   Drug use: Yes    Types: Marijuana    Comment: occasional     Allergies   Patient has no known allergies.   Review of Systems Review of Systems   Physical Exam Triage Vital Signs ED Triage Vitals  Encounter Vitals Group     BP 11/16/22 1550 105/60     Systolic BP Percentile --      Diastolic BP Percentile --      Pulse Rate 11/16/22 1550 100     Resp 11/16/22 1550 16     Temp 11/16/22 1550 98.4 F (36.9 C)     Temp src --      SpO2 11/16/22 1550 98 %     Weight --      Height --      Head Circumference --      Peak Flow --      Pain Score 11/16/22 1548 0     Pain Loc --      Pain Education --      Exclude from Growth Chart --    No data found.  Updated Vital Signs BP 105/60   Pulse  100   Temp 98.4 F (36.9 C)   Resp 16   SpO2 98%   Visual Acuity Right Eye Distance:   Left Eye Distance:   Bilateral Distance:    Right Eye Near:   Left Eye Near:    Bilateral Near:     Physical Exam Constitutional:  Appearance: Normal appearance.  Eyes:     Extraocular Movements: Extraocular movements intact.  Skin:    Comments: Dry flaking papular rash present to the posterior neck  Neurological:     Mental Status: She is alert and oriented to person, place, and time. Mental status is at baseline.      UC Treatments / Results  Labs (all labs ordered are listed, but only abnormal results are displayed) Labs Reviewed - No data to display  EKG   Radiology No results found.  Procedures Procedures (including critical care time)  Medications Ordered in UC Medications - No data to display  Initial Impression / Assessment and Plan / UC Course  I have reviewed the triage vital signs and the nursing notes.  Pertinent labs & imaging results that were available during my care of the patient were reviewed by me and considered in my medical decision making (see chart for details).  Atopic dermatitis  Presentation is consistent with eczema, discussed with patient, methylprednisolone injection given in office, prescribed Kenalog cream and discussed administration, recommended continued use of emollient with avoidance of long exposure to water, given written handout of additional supportive care for management of rash, may follow-up with urgent care as needed, may also continue to follow-up with allergist if symptoms continue to persist Final Clinical Impressions(s) / UC Diagnoses   Final diagnoses:  None   Discharge Instructions   None    ED Prescriptions   None    PDMP not reviewed this encounter.   Valinda Hoar, [place] 11/16/22 [phone]

## 2022-11-16 NOTE — ED Triage Notes (Signed)
Pt c/o itching on posterior neck for months. Reports came here and got steroid shot, but never followed up with allergist. Reports for past couple weeks still having itching.

## 2022-11-16 NOTE — Discharge Instructions (Addendum)
Today you were evaluated for your persistent itching and rash   rash on the back of your neck is consistent with atopic dermatitis (eczema) which is a common skin condition, there is more information in your packet about this, read at your leisure  You have been given an injection here today in the office of steroids to help reduce inflammation, daily see improvement in next 30 minutes to an hour  You have been prescribed steroid cream which is the treatment for eczema, apply every morning and every evening until rash is cleared then you may stop use, may use as needed when flares occur  Moisturize skin daily as dryness will cause further irritation, may continue use of Vaseline  May follow-up at this urgent care as needed for any further concerns

## 2023-09-10 ENCOUNTER — Emergency Department (HOSPITAL_COMMUNITY)
Admission: EM | Admit: 2023-09-10 | Discharge: 2023-09-10 | Disposition: A | Discharge status: Home / Self Care | Payer: MEDICAID | Attending: Emergency Medicine | Admitting: Emergency Medicine

## 2023-09-10 ENCOUNTER — Other Ambulatory Visit: Payer: Self-pay

## 2023-09-10 ENCOUNTER — Encounter (HOSPITAL_COMMUNITY): Payer: Self-pay

## 2023-09-10 DIAGNOSIS — Z79899 Other long term (current) drug therapy: Secondary | ICD-10-CM | POA: Diagnosis not present

## 2023-09-10 DIAGNOSIS — Z202 Contact with and (suspected) exposure to infections with a predominantly sexual mode of transmission: Secondary | ICD-10-CM | POA: Diagnosis present

## 2023-09-10 DIAGNOSIS — I10 Essential (primary) hypertension: Secondary | ICD-10-CM | POA: Diagnosis not present

## 2023-09-10 LAB — WET PREP, GENITAL
Clue Cells Wet Prep HPF POC: NONE SEEN
Sperm: NONE SEEN
Trich, Wet Prep: NONE SEEN
WBC, Wet Prep HPF POC: >=10 — AB (ref <10)
Yeast Wet Prep HPF POC: NONE SEEN

## 2023-09-10 MED ORDER — METRONIDAZOLE 500 MG PO TABS
2000.0000 mg | ORAL_TABLET | Freq: Once | ORAL | Status: AC
  Administered 2023-09-10: 2000 mg via ORAL
  Filled 2023-09-10: qty 4

## 2023-09-10 NOTE — ED Triage Notes (Signed)
 Pt came in via POV d/t her partner letting her know that he was tested positive for STD & she wanted to be tested as well. A/Ox4, denies any s/s of it.

## 2023-09-10 NOTE — ED Provider Notes (Signed)
 [place] [facility] AT [facility] Provider Note   CSN: [id number] Arrival date & time: 09/10/23  4098     History  Chief Complaint  Patient presents with   Request STD check    Grace [name] is a 38 y.o. female.  HPI   Patient has history of sickle cell trait, hypertension.  She presents to the ED for positive STI exposure.  Patient states her partner told her he was diagnosed with trichomonas recently.  Patient denies any symptoms.  No complaints of pain.  No discharge.  She states she only has 1 partner  Home Medications Prior to Admission medications   Medication Sig Start Date End Date Taking? Authorizing Provider  buPROPion  (WELLBUTRIN  XL) 150 MG 24 hr tablet Take 1 tablet (150 mg total) by mouth daily. 04/14/22 05/14/22  Leevy-Johnson, Quillian Brunt, NP  citalopram  (CELEXA ) 40 MG tablet Take 1 tablet (40 mg total) by mouth daily. 04/14/22   Leevy-Johnson, Quillian Brunt, NP  hydrOXYzine  (ATARAX ) 50 MG tablet Take 1 tablet (50 mg total) by mouth 3 (three) times daily as needed for anxiety. 04/13/22   Leevy-Johnson, Quillian Brunt, NP  metoprolol  tartrate (LOPRESSOR ) 25 MG tablet Take 0.5 tablets (12.5 mg total) by mouth 2 (two) times daily for 15 days. 04/13/22 04/28/22  Leevy-Johnson, Quillian Brunt, NP  QUEtiapine  (SEROQUEL  XR) 300 MG 24 hr tablet Take 1 tablet (300 mg total) by mouth at bedtime. 04/13/22 05/13/22  Leevy-Johnson, Quillian Brunt, NP  QUEtiapine  (SEROQUEL  XR) 50 MG TB24 24 hr tablet Take 1 tablet (50 mg total) by mouth 2 (two) times daily. 04/13/22   Leevy-Johnson, Quillian Brunt, NP  triamcinolone  cream (KENALOG ) 0.1 % Apply 1 Application topically 2 (two) times daily. 11/16/22   White, Maybelle Spatz, NP  methimazole  (TAPAZOLE ) 10 MG tablet Take 2 tablets (20 mg total) by mouth 2 (two) times daily. 07/09/19 09/05/20  Sykes, Janet E, NP      Allergies    Patient has no known allergies.    Review of Systems   Review of Systems  Physical Exam Updated Vital Signs BP  118/78 (BP Location: Right Arm)   Pulse 95   Temp 98.3 F (36.8 C) (Oral)   Resp 18   Ht 1.543 m (5' 0.75")   Wt 63.5 kg   SpO2 100%   BMI 26.67 kg/m  Physical Exam Vitals and nursing note reviewed.  Constitutional:      General: She is not in acute distress.    Appearance: She is well-developed.  HENT:     Head: Normocephalic and atraumatic.     Right Ear: External ear normal.     Left Ear: External ear normal.  Eyes:     General: No scleral icterus.       Right eye: No discharge.        Left eye: No discharge.     Conjunctiva/sclera: Conjunctivae normal.  Neck:     Trachea: No tracheal deviation.  Cardiovascular:     Rate and Rhythm: Normal rate.  Pulmonary:     Effort: Pulmonary effort is normal. No respiratory distress.     Breath sounds: No stridor.  Abdominal:     General: There is no distension.  Musculoskeletal:        General: No swelling or deformity.     Cervical back: Neck supple.  Skin:    General: Skin is warm and dry.     Findings: No rash.  Neurological:     Mental  Status: She is alert. Mental status is at baseline.     Cranial Nerves: No dysarthria or facial asymmetry.     Motor: No seizure activity.     ED Results / Procedures / Treatments   Labs (all labs ordered are listed, but only abnormal results are displayed) Labs Reviewed  WET PREP, GENITAL  GC/CHLAMYDIA PROBE AMP ([place]) NOT AT [facility]     Procedures Procedures    Medications Ordered in ED Medications  metroNIDAZOLE  (FLAGYL ) tablet 2,000 mg (2,000 mg Oral Given 09/10/23 1027)    ED Course/ Medical Decision Making/ A&P                                 Medical Decision Making Amount and/or Complexity of Data Reviewed Labs: ordered.  Risk Prescription drug management.   Offered HIV and syphilis screening.  Patient declines.  She only wants to be tested for trichomonas.  She does agree to [facility] chlamydia testing.  Will also plan on treating empirically for  trichomonas.        Final Clinical Impression(s) / ED Diagnoses Final diagnoses:  Possible exposure to STI    Rx / DC Orders ED Discharge Orders     None         Trish Furl, MD 09/10/23 1043

## 2023-09-10 NOTE — Discharge Instructions (Addendum)
 You are given metronidazole , an antibiotic to empirically treat possible trichomonas.  Avoid any alcohol for the next 24 hours.    Your results will show up in mychart

## 2023-09-13 LAB — GC/CHLAMYDIA PROBE AMP (~~LOC~~) NOT AT ARMC
Chlamydia: POSITIVE — AB
Comment: NEGATIVE
Comment: NORMAL
Neisseria Gonorrhea: NEGATIVE

## 2023-09-14 ENCOUNTER — Ambulatory Visit (HOSPITAL_COMMUNITY): Payer: Self-pay

## 2023-10-07 ENCOUNTER — Ambulatory Visit (HOSPITAL_COMMUNITY)
Admission: EM | Admit: 2023-10-07 | Discharge: 2023-10-07 | Disposition: A | Discharge status: Home / Self Care | Payer: MEDICAID | Attending: Emergency Medicine | Admitting: Emergency Medicine

## 2023-10-07 ENCOUNTER — Encounter (HOSPITAL_COMMUNITY): Payer: Self-pay

## 2023-10-07 DIAGNOSIS — B9689 Other specified bacterial agents as the cause of diseases classified elsewhere: Secondary | ICD-10-CM | POA: Diagnosis not present

## 2023-10-07 DIAGNOSIS — H109 Unspecified conjunctivitis: Secondary | ICD-10-CM | POA: Diagnosis not present

## 2023-10-07 MED ORDER — ERYTHROMYCIN 5 MG/GM OP OINT: 1.0000 | TOPICAL_OINTMENT | Freq: Four times a day (QID) | OPHTHALMIC | 0 refills | Status: AC

## 2023-10-07 NOTE — ED Triage Notes (Signed)
 Patient reports that she began having pressure, redness, drainage, and swelling to the right eye then it went to the left eye approx 3 weeks ago. Patient states that on week 2 she began having blurred vision. Patient states she has light sensitivity and strains to see which causes headaches.  Patient states she rubs her eyes a lot and when she wakes up in the morning her eyes are swollen shut.  Patient states she has been through 5 bottles of Visine eye drops

## 2023-10-07 NOTE — ED Provider Notes (Signed)
 [facility]    CSN: [id number] Arrival date & time: 10/07/23  1456      History   Chief Complaint Chief Complaint  Patient presents with   Eye Pain    HPI Grace [name] is a 38 y.o. female.   Patient presents with bilateral eye redness, drainage, and intermittent swelling x 2 weeks.  Patient states that she occasionally has pain, pressure, and some photophobia to both eyes as well.  Patient states that upon waking her eyes will be swollen shut and there is crusty discharge to both of her eyes as well.  Patient states that the symptoms began in her left eye and then a few days later began in her right eye.  Patient states that she has been applying Visine drops often without relief.  Patient denies wearing contacts or glasses.  The history is provided by the patient and medical records.  Eye Pain    Past Medical History:  Diagnosis Date   Cholecystitis 08/18/2011   Headache(784.0)    otc med prn   History of recurrent UTIs    recurrent UTIs   Hypertension    Hyperthyroidism    Joint pain    knees and shoulders - otc med prn   Sickle cell trait (HCC)    Sickle cell trait (HCC)    SVD (spontaneous vaginal delivery)    x 3 WH    Patient Active Problem List   Diagnosis Date Noted   UTI (urinary tract infection) 04/02/2022   Alcohol use 04/02/2022   Cannabis use disorder 04/02/2022   GAD (generalized anxiety disorder) 04/02/2022   Severe recurrent major depression without psychotic features (HCC) 06/30/2019   Major depressive disorder, single episode, severe without psychotic features (HCC) 01/05/2016   Severe episode of recurrent major depressive disorder, with psychotic features (HCC)    MDD (major depressive disorder), recurrent episode, severe (HCC) 04/15/2015   Hyperthyroidism 03/21/2015   Tachycardia 03/21/2015   Palpitations 03/21/2015   Chest pain 03/21/2015   MDD (major depressive disorder), recurrent episode, moderate (HCC) 03/16/2015    Consultation for female sterilization 05/09/2013   NSVD (normal spontaneous vaginal delivery) 03/30/2013   Chronic hypertension in pregnancy 03/29/2013   HTN in pregnancy, chronic 03/28/2013   Transient hypertension of pregnancy, antepartum 03/20/2013   GERD (gastroesophageal reflux disease) 01/18/2013   Hyperemesis gravidarum before end of [redacted] week gestation with electrolyte imbalance 09/23/2012   Trichomoniasis 09/23/2012   Supervision of high-risk pregnancy 04/19/2011   History of molar pregnancy, antepartum 03/22/2011   Antepartum thyroid  dysfunction 03/16/2011   Sickle cell trait (HCC) 12/30/2010    Past Surgical History:  Procedure Laterality Date   CHOLECYSTECTOMY  08/19/2011   Procedure: LAPAROSCOPIC CHOLECYSTECTOMY WITH INTRAOPERATIVE CHOLANGIOGRAM;  Surgeon: Evander Hills, DO;  Location: [facility];  Service: General;  Laterality: N/A;   DILATION AND CURETTAGE OF UTERUS     molar preg   LAPAROSCOPIC BILATERAL SALPINGECTOMY Bilateral 05/09/2013   Procedure: LAPAROSCOPIC BILATERAL SALPINGECTOMY;  Surgeon: Ana Balling, MD;  Location: [facility];  Service: Gynecology;  Laterality: Bilateral;    OB History     Gravida  4   Para  3   Term  3   Preterm      AB  1   Living  3      SAB  1   IAB      Ectopic      Multiple      Live Births  3  Home Medications    Prior to Admission medications   Medication Sig Start Date End Date Taking? Authorizing Provider  erythromycin ophthalmic ointment Place 1 Application into the right eye 4 (four) times daily for 5 days. Place a 1/2 inch ribbon of ointment into the lower eyelid. 10/07/23 10/12/23 Yes Rosevelt Constable, Lawan Nanez A, NP  buPROPion  (WELLBUTRIN  XL) 150 MG 24 hr tablet Take 1 tablet (150 mg total) by mouth daily. 04/14/22 05/14/22  Leevy-Johnson, Quillian Brunt, NP  citalopram  (CELEXA ) 40 MG tablet Take 1 tablet (40 mg total) by mouth daily. 04/14/22   Leevy-Johnson, Quillian Brunt, NP  hydrOXYzine  (ATARAX ) 50 MG tablet Take 1  tablet (50 mg total) by mouth 3 (three) times daily as needed for anxiety. 04/13/22   Leevy-Johnson, Quillian Brunt, NP  metoprolol  tartrate (LOPRESSOR ) 25 MG tablet Take 0.5 tablets (12.5 mg total) by mouth 2 (two) times daily for 15 days. 04/13/22 04/28/22  Leevy-Johnson, Quillian Brunt, NP  QUEtiapine  (SEROQUEL  XR) 300 MG 24 hr tablet Take 1 tablet (300 mg total) by mouth at bedtime. 04/13/22 05/13/22  Leevy-Johnson, Quillian Brunt, NP  QUEtiapine  (SEROQUEL  XR) 50 MG TB24 24 hr tablet Take 1 tablet (50 mg total) by mouth 2 (two) times daily. 04/13/22   Leevy-Johnson, Quillian Brunt, NP  triamcinolone  cream (KENALOG ) 0.1 % Apply 1 Application topically 2 (two) times daily. 11/16/22   White, Maybelle Spatz, NP  methimazole  (TAPAZOLE ) 10 MG tablet Take 2 tablets (20 mg total) by mouth 2 (two) times daily. 07/09/19 09/05/20  Rochell Chroman, NP    Family History Family History  Problem Relation Age of Onset   Diabetes Mother    Cancer Father    Diabetes Maternal Uncle    Cancer Maternal Grandmother    Cancer Maternal Grandfather    Hypertension Maternal Grandfather    Diabetes Maternal Grandfather    Sickle cell anemia Son    Asthma Son    Heart disease Paternal Grandfather    Hearing loss Paternal Aunt     Social History Social History   Tobacco Use   Smoking status: Never   Smokeless tobacco: Never  Vaping Use   Vaping status: Never Used  Substance Use Topics   Alcohol use: No   Drug use: Yes    Types: Marijuana    Comment: occasional     Allergies   Patient has no known allergies.   Review of Systems Review of Systems  Eyes:  Positive for pain.   Per HPI  Physical Exam Triage Vital Signs ED Triage Vitals [10/07/23 1536]  Encounter Vitals Group     BP 123/78     Girls Systolic BP Percentile      Girls Diastolic BP Percentile      Boys Systolic BP Percentile      Boys Diastolic BP Percentile      Pulse Rate 94     Resp 14     Temp 98.2 F (36.8 C)     Temp Source Oral     SpO2 99 %      Weight      Height      Head Circumference      Peak Flow      Pain Score 10     Pain Loc      Pain Education      Exclude from Growth Chart    No data found.  Updated Vital Signs BP 123/78 (BP Location: Right Arm)   Pulse 94   Temp 98.2 F (  36.8 C) (Oral)   Resp 14   LMP 09/22/2023 (Approximate)   SpO2 99%   Visual Acuity Right Eye Distance:   Left Eye Distance:   Bilateral Distance:    Right Eye Near:   Left Eye Near:    Bilateral Near:     Physical Exam Vitals and nursing note reviewed.  Constitutional:      General: She is awake. She is not in acute distress.    Appearance: Normal appearance. She is well-developed and well-groomed. She is not ill-appearing.   Eyes:     General:        Right eye: Discharge (purulent) present.        Left eye: Discharge (purulent) present.    Extraocular Movements: Extraocular movements intact.     Conjunctiva/sclera:     Right eye: Right conjunctiva is injected.     Left eye: Left conjunctiva is injected.     Pupils: Pupils are equal, round, and reactive to light.    Skin:    General: Skin is warm and dry.   Neurological:     Mental Status: She is alert.   Psychiatric:        Behavior: Behavior is cooperative.      UC Treatments / Results  Labs (all labs ordered are listed, but only abnormal results are displayed) Labs Reviewed - No data to display  EKG   Radiology No results found.  Procedures Procedures (including critical care time)  Medications Ordered in UC Medications - No data to display  Initial Impression / Assessment and Plan / UC Course  I have reviewed the triage vital signs and the nursing notes.  Pertinent labs & imaging results that were available during my care of the patient were reviewed by me and considered in my medical decision making (see chart for details).     Patient is overall well-appearing.  Vitals are stable.  Upon assessment bilateral conjunctival are injected and there  is some mild purulent discharge noted to the inner corners of bilateral eyes.  Prescribed erythromycin for bacterial conjunctivitis coverage.  Given orthopedic follow-up.  Discussed follow-up and return precautions. Final Clinical Impressions(s) / UC Diagnoses   Final diagnoses:  Bacterial conjunctivitis of both eyes     Discharge Instructions      Apply erythromycin to both eyes 4 times daily for 5 days for bacterial conjunctivitis coverage. Be sure to wash your hands before and after applying ointment. If your symptoms persist I recommend calling the ophthalmologist that I am attached to paperwork for further evaluation.    ED Prescriptions     Medication Sig Dispense Auth. Provider   erythromycin ophthalmic ointment Place 1 Application into the right eye 4 (four) times daily for 5 days. Place a 1/2 inch ribbon of ointment into the lower eyelid. 3.5 g Levora Reas A, NP      PDMP not reviewed this encounter.   Levora Reas A, NP 10/07/23 1606

## 2023-10-07 NOTE — Discharge Instructions (Addendum)
 Apply erythromycin to both eyes 4 times daily for 5 days for bacterial conjunctivitis coverage. Be sure to wash your hands before and after applying ointment. If your symptoms persist I recommend calling the ophthalmologist that I am attached to paperwork for further evaluation.
